# Patient Record
Sex: Female | Born: 1940
Health system: Southern US, Community
[De-identification: ages and names within clinical notes are randomized; demographics above are authoritative.]

## PROBLEM LIST (undated history)

## (undated) DIAGNOSIS — C801 Malignant (primary) neoplasm, unspecified: Secondary | ICD-10-CM

## (undated) DIAGNOSIS — K5790 Diverticulosis of intestine, part unspecified, without perforation or abscess without bleeding: Secondary | ICD-10-CM

## (undated) DIAGNOSIS — Z8619 Personal history of other infectious and parasitic diseases: Secondary | ICD-10-CM

## (undated) DIAGNOSIS — B029 Zoster without complications: Secondary | ICD-10-CM

## (undated) DIAGNOSIS — I2510 Atherosclerotic heart disease of native coronary artery without angina pectoris: Secondary | ICD-10-CM

## (undated) DIAGNOSIS — K648 Other hemorrhoids: Secondary | ICD-10-CM

## (undated) DIAGNOSIS — E119 Type 2 diabetes mellitus without complications: Secondary | ICD-10-CM

## (undated) DIAGNOSIS — L900 Lichen sclerosus et atrophicus: Secondary | ICD-10-CM

## (undated) DIAGNOSIS — E785 Hyperlipidemia, unspecified: Secondary | ICD-10-CM

## (undated) DIAGNOSIS — K219 Gastro-esophageal reflux disease without esophagitis: Secondary | ICD-10-CM

## (undated) DIAGNOSIS — K222 Esophageal obstruction: Secondary | ICD-10-CM

## (undated) DIAGNOSIS — L121 Cicatricial pemphigoid: Secondary | ICD-10-CM

## (undated) DIAGNOSIS — N189 Chronic kidney disease, unspecified: Secondary | ICD-10-CM

## (undated) DIAGNOSIS — K635 Polyp of colon: Secondary | ICD-10-CM

## (undated) DIAGNOSIS — R7989 Other specified abnormal findings of blood chemistry: Secondary | ICD-10-CM

## (undated) DIAGNOSIS — I10 Essential (primary) hypertension: Secondary | ICD-10-CM

## (undated) DIAGNOSIS — R945 Abnormal results of liver function studies: Secondary | ICD-10-CM

## (undated) HISTORY — PX: ANGIOPLASTY: SHX39

## (undated) HISTORY — DX: Hyperlipidemia, unspecified: E78.5

## (undated) HISTORY — DX: Essential (primary) hypertension: I10

## (undated) HISTORY — DX: Cicatricial pemphigoid: L12.1

## (undated) HISTORY — DX: Abnormal results of liver function studies: R94.5

## (undated) HISTORY — DX: Atherosclerotic heart disease of native coronary artery without angina pectoris: I25.10

## (undated) HISTORY — PX: CARDIAC ELECTROPHYSIOLOGY MAPPING AND ABLATION: SHX1292

## (undated) HISTORY — DX: Type 2 diabetes mellitus without complications: E11.9

## (undated) HISTORY — DX: Esophageal obstruction: K22.2

## (undated) HISTORY — PX: CATARACT EXTRACTION: SUR2

## (undated) HISTORY — PX: OOPHORECTOMY: SHX86

## (undated) HISTORY — DX: Polyp of colon: K63.5

## (undated) HISTORY — PX: NOSE SURGERY: SHX723

## (undated) HISTORY — DX: Personal history of other infectious and parasitic diseases: Z86.19

## (undated) HISTORY — DX: Other specified abnormal findings of blood chemistry: R79.89

## (undated) HISTORY — DX: Lichen sclerosus et atrophicus: L90.0

## (undated) HISTORY — DX: Gastro-esophageal reflux disease without esophagitis: K21.9

## (undated) HISTORY — DX: Other hemorrhoids: K64.8

## (undated) HISTORY — PX: COLONOSCOPY: SHX174

## (undated) HISTORY — DX: Diverticulosis of intestine, part unspecified, without perforation or abscess without bleeding: K57.90

## (undated) HISTORY — PX: ABDOMINAL HYSTERECTOMY: SHX81

---

## 1973-02-28 DIAGNOSIS — Z8619 Personal history of other infectious and parasitic diseases: Secondary | ICD-10-CM

## 1973-02-28 HISTORY — DX: Personal history of other infectious and parasitic diseases: Z86.19

## 2001-01-01 ENCOUNTER — Emergency Department (HOSPITAL_COMMUNITY): Admission: EM | Admit: 2001-01-01 | Discharge: 2001-01-01 | Payer: Self-pay | Admitting: Emergency Medicine

## 2002-04-03 ENCOUNTER — Encounter: Payer: Self-pay | Admitting: Family Medicine

## 2002-04-03 ENCOUNTER — Other Ambulatory Visit: Admission: RE | Admit: 2002-04-03 | Discharge: 2002-04-03 | Payer: Self-pay | Admitting: Family Medicine

## 2002-04-03 LAB — CONVERTED CEMR LAB: Pap Smear: NORMAL

## 2003-10-01 ENCOUNTER — Encounter: Payer: Self-pay | Admitting: Family Medicine

## 2003-12-26 ENCOUNTER — Ambulatory Visit: Payer: Self-pay | Admitting: Family Medicine

## 2004-01-06 ENCOUNTER — Ambulatory Visit: Payer: Self-pay | Admitting: Family Medicine

## 2004-02-05 ENCOUNTER — Ambulatory Visit: Payer: Self-pay | Admitting: Family Medicine

## 2004-08-17 ENCOUNTER — Ambulatory Visit: Payer: Self-pay | Admitting: Family Medicine

## 2004-11-12 ENCOUNTER — Ambulatory Visit: Payer: Self-pay | Admitting: Family Medicine

## 2005-02-02 ENCOUNTER — Ambulatory Visit: Payer: Self-pay | Admitting: Family Medicine

## 2005-02-03 ENCOUNTER — Ambulatory Visit: Payer: Self-pay | Admitting: Family Medicine

## 2005-02-22 ENCOUNTER — Ambulatory Visit: Payer: Self-pay | Admitting: Family Medicine

## 2005-06-21 ENCOUNTER — Ambulatory Visit: Payer: Self-pay | Admitting: Gastroenterology

## 2005-11-14 ENCOUNTER — Ambulatory Visit: Payer: Self-pay | Admitting: Ophthalmology

## 2005-12-02 ENCOUNTER — Ambulatory Visit: Payer: Self-pay | Admitting: Ophthalmology

## 2005-12-26 ENCOUNTER — Ambulatory Visit: Payer: Self-pay | Admitting: Ophthalmology

## 2006-01-02 ENCOUNTER — Ambulatory Visit: Payer: Self-pay | Admitting: Ophthalmology

## 2006-03-28 ENCOUNTER — Ambulatory Visit: Payer: Self-pay | Admitting: Family Medicine

## 2006-03-28 LAB — CONVERTED CEMR LAB
ALT: 34 units/L (ref 0–40)
AST: 35 units/L (ref 0–37)
Albumin: 3.5 g/dL (ref 3.5–5.2)
BUN: 10 mg/dL (ref 6–23)
Basophils Absolute: 0.1 10*3/uL (ref 0.0–0.1)
Basophils Relative: 0.8 % (ref 0.0–1.0)
CO2: 28 meq/L (ref 19–32)
Calcium: 9.6 mg/dL (ref 8.4–10.5)
Chloride: 108 meq/L (ref 96–112)
Cholesterol: 150 mg/dL (ref 0–200)
Creatinine, Ser: 0.7 mg/dL (ref 0.4–1.2)
Eosinophils Absolute: 0.2 10*3/uL (ref 0.0–0.6)
Eosinophils Relative: 1.6 % (ref 0.0–5.0)
Free T4: 0.7 ng/dL (ref 0.6–1.6)
GFR calc Af Amer: 107 mL/min
GFR calc non Af Amer: 89 mL/min
Glucose, Bld: 105 mg/dL — ABNORMAL HIGH (ref 70–99)
HCT: 40.7 % (ref 36.0–46.0)
HDL: 61.2 mg/dL (ref >39.0)
Hemoglobin: 14.3 g/dL (ref 12.0–15.0)
LDL Cholesterol: 72 mg/dL (ref 0–99)
Lymphocytes Relative: 34.1 % (ref 12.0–46.0)
MCHC: 35.1 g/dL (ref 30.0–36.0)
MCV: 91.9 fL (ref 78.0–100.0)
Monocytes Absolute: 0.6 10*3/uL (ref 0.2–0.7)
Monocytes Relative: 5.7 % (ref 3.0–11.0)
Neutro Abs: 5.9 10*3/uL (ref 1.4–7.7)
Neutrophils Relative %: 57.8 % (ref 43.0–77.0)
Phosphorus: 4.1 mg/dL (ref 2.3–4.6)
Platelets: 241 10*3/uL (ref 150–400)
Potassium: 4.7 meq/L (ref 3.5–5.1)
RBC: 4.43 M/uL (ref 3.87–5.11)
RDW: 11.9 % (ref 11.5–14.6)
Sodium: 141 meq/L (ref 135–145)
TSH: 2.31 microintl units/mL (ref 0.35–5.50)
Total CHOL/HDL Ratio: 2.5
Triglycerides: 85 mg/dL (ref 0–149)
VLDL: 17 mg/dL (ref 0–40)
WBC: 10.3 10*3/uL (ref 4.5–10.5)

## 2006-05-24 ENCOUNTER — Ambulatory Visit: Payer: Self-pay | Admitting: Family Medicine

## 2006-05-30 ENCOUNTER — Ambulatory Visit: Payer: Self-pay | Admitting: Family Medicine

## 2006-06-07 LAB — HM SIGMOIDOSCOPY: HM Sigmoidoscopy: NEGATIVE

## 2006-06-12 ENCOUNTER — Ambulatory Visit: Payer: Self-pay | Admitting: Family Medicine

## 2006-08-10 ENCOUNTER — Telehealth: Payer: Self-pay | Admitting: Family Medicine

## 2006-08-10 ENCOUNTER — Ambulatory Visit: Payer: Self-pay | Admitting: Family Medicine

## 2006-08-10 LAB — CONVERTED CEMR LAB
Bilirubin Urine: NEGATIVE
Casts: 0 /lpf
Glucose, Urine, Semiquant: NEGATIVE
Ketones, urine, test strip: NEGATIVE
Mucus, UA: 0
Nitrite: NEGATIVE
Protein, U semiquant: NEGATIVE
RBC / HPF: 0
Specific Gravity, Urine: <1.005
Urine crystals, microscopic: 0 /hpf
Urobilinogen, UA: 0.2
Yeast, UA: 0
pH: 6

## 2006-08-14 DIAGNOSIS — Z87440 Personal history of urinary (tract) infections: Secondary | ICD-10-CM | POA: Insufficient documentation

## 2007-02-18 ENCOUNTER — Emergency Department: Payer: Self-pay | Admitting: Emergency Medicine

## 2007-02-26 ENCOUNTER — Emergency Department: Payer: Self-pay | Admitting: Emergency Medicine

## 2007-03-13 ENCOUNTER — Encounter: Payer: Self-pay | Admitting: Family Medicine

## 2007-03-13 DIAGNOSIS — R32 Unspecified urinary incontinence: Secondary | ICD-10-CM | POA: Insufficient documentation

## 2007-03-13 DIAGNOSIS — J309 Allergic rhinitis, unspecified: Secondary | ICD-10-CM | POA: Insufficient documentation

## 2007-03-13 DIAGNOSIS — L659 Nonscarring hair loss, unspecified: Secondary | ICD-10-CM | POA: Insufficient documentation

## 2007-03-13 DIAGNOSIS — M79609 Pain in unspecified limb: Secondary | ICD-10-CM | POA: Insufficient documentation

## 2007-03-13 DIAGNOSIS — Z860100 Personal history of colon polyps, unspecified: Secondary | ICD-10-CM | POA: Insufficient documentation

## 2007-03-13 DIAGNOSIS — I10 Essential (primary) hypertension: Secondary | ICD-10-CM | POA: Insufficient documentation

## 2007-03-13 DIAGNOSIS — K219 Gastro-esophageal reflux disease without esophagitis: Secondary | ICD-10-CM | POA: Insufficient documentation

## 2007-03-13 DIAGNOSIS — I2510 Atherosclerotic heart disease of native coronary artery without angina pectoris: Secondary | ICD-10-CM | POA: Insufficient documentation

## 2007-03-13 DIAGNOSIS — E785 Hyperlipidemia, unspecified: Secondary | ICD-10-CM | POA: Insufficient documentation

## 2007-03-13 DIAGNOSIS — K449 Diaphragmatic hernia without obstruction or gangrene: Secondary | ICD-10-CM | POA: Insufficient documentation

## 2007-03-14 ENCOUNTER — Ambulatory Visit: Payer: Self-pay | Admitting: Family Medicine

## 2007-03-19 ENCOUNTER — Telehealth (INDEPENDENT_AMBULATORY_CARE_PROVIDER_SITE_OTHER): Payer: Self-pay | Admitting: *Deleted

## 2007-05-31 ENCOUNTER — Other Ambulatory Visit: Admission: RE | Admit: 2007-05-31 | Discharge: 2007-05-31 | Payer: Self-pay | Admitting: Family Medicine

## 2007-05-31 ENCOUNTER — Ambulatory Visit: Payer: Self-pay | Admitting: Family Medicine

## 2007-05-31 LAB — CONVERTED CEMR LAB
Bacteria, UA: 0
Bilirubin Urine: NEGATIVE
Blood in Urine, dipstick: NEGATIVE
Casts: 0 /lpf
Glucose, Urine, Semiquant: NEGATIVE
Nitrite: NEGATIVE
Pap Smear: ABNORMAL
RBC / HPF: 0
Specific Gravity, Urine: 1.01
Urine crystals, microscopic: 0 /hpf
Urobilinogen, UA: 0.2
WBC, UA: 0 cells/hpf
Yeast, UA: 0
pH: 6.5

## 2007-06-04 ENCOUNTER — Ambulatory Visit: Payer: Self-pay | Admitting: Family Medicine

## 2007-06-04 LAB — CONVERTED CEMR LAB
ALT: 21 units/L (ref 0–35)
AST: 21 units/L (ref 0–37)
Albumin: 3.7 g/dL (ref 3.5–5.2)
Alkaline Phosphatase: 109 units/L (ref 39–117)
BUN: 11 mg/dL (ref 6–23)
Basophils Absolute: 0.1 10*3/uL (ref 0.0–0.1)
Basophils Relative: 0.6 % (ref 0.0–1.0)
Bilirubin, Direct: 0.1 mg/dL (ref 0.0–0.3)
CO2: 30 meq/L (ref 19–32)
Calcium: 9.5 mg/dL (ref 8.4–10.5)
Chloride: 102 meq/L (ref 96–112)
Cholesterol: 161 mg/dL (ref 0–200)
Creatinine, Ser: 0.7 mg/dL (ref 0.4–1.2)
Eosinophils Absolute: 0.5 10*3/uL (ref 0.0–0.7)
Eosinophils Relative: 4.6 % (ref 0.0–5.0)
GFR calc Af Amer: 107 mL/min
GFR calc non Af Amer: 88 mL/min
Glucose, Bld: 91 mg/dL (ref 70–99)
HCT: 43.7 % (ref 36.0–46.0)
HDL: 53.2 mg/dL (ref >39.0)
Hemoglobin: 14.1 g/dL (ref 12.0–15.0)
LDL Cholesterol: 90 mg/dL (ref 0–99)
Lymphocytes Relative: 31.7 % (ref 12.0–46.0)
MCHC: 32.2 g/dL (ref 30.0–36.0)
MCV: 92.9 fL (ref 78.0–100.0)
Monocytes Absolute: 0.6 10*3/uL (ref 0.1–1.0)
Monocytes Relative: 5.7 % (ref 3.0–12.0)
Neutro Abs: 6.1 10*3/uL (ref 1.4–7.7)
Neutrophils Relative %: 57.4 % (ref 43.0–77.0)
Phosphorus: 3.8 mg/dL (ref 2.3–4.6)
Platelets: 264 10*3/uL (ref 150–400)
Potassium: 4 meq/L (ref 3.5–5.1)
RBC: 4.71 M/uL (ref 3.87–5.11)
RDW: 11.8 % (ref 11.5–14.6)
Sodium: 140 meq/L (ref 135–145)
TSH: 1.67 microintl units/mL (ref 0.35–5.50)
Total Bilirubin: 0.7 mg/dL (ref 0.3–1.2)
Total CHOL/HDL Ratio: 3
Total Protein: 7.2 g/dL (ref 6.0–8.3)
Triglycerides: 90 mg/dL (ref 0–149)
VLDL: 18 mg/dL (ref 0–40)
WBC: 10.7 10*3/uL — ABNORMAL HIGH (ref 4.5–10.5)

## 2007-06-21 ENCOUNTER — Ambulatory Visit: Payer: Self-pay | Admitting: Family Medicine

## 2007-06-27 ENCOUNTER — Encounter: Payer: Self-pay | Admitting: Family Medicine

## 2007-06-28 ENCOUNTER — Encounter: Payer: Self-pay | Admitting: Family Medicine

## 2007-06-29 DIAGNOSIS — L900 Lichen sclerosus et atrophicus: Secondary | ICD-10-CM

## 2007-06-29 HISTORY — DX: Lichen sclerosus et atrophicus: L90.0

## 2007-07-02 ENCOUNTER — Telehealth: Payer: Self-pay | Admitting: Family Medicine

## 2007-07-03 ENCOUNTER — Encounter (INDEPENDENT_AMBULATORY_CARE_PROVIDER_SITE_OTHER): Payer: Self-pay | Admitting: *Deleted

## 2007-07-05 DIAGNOSIS — R930 Abnormal findings on diagnostic imaging of skull and head, not elsewhere classified: Secondary | ICD-10-CM | POA: Insufficient documentation

## 2007-07-06 ENCOUNTER — Encounter: Payer: Self-pay | Admitting: Family Medicine

## 2007-07-09 ENCOUNTER — Encounter: Payer: Self-pay | Admitting: Family Medicine

## 2007-07-13 ENCOUNTER — Encounter: Payer: Self-pay | Admitting: Family Medicine

## 2007-08-21 ENCOUNTER — Telehealth: Payer: Self-pay | Admitting: Family Medicine

## 2007-09-03 ENCOUNTER — Encounter: Payer: Self-pay | Admitting: Family Medicine

## 2007-09-21 ENCOUNTER — Ambulatory Visit: Payer: Self-pay | Admitting: Family Medicine

## 2007-09-21 ENCOUNTER — Telehealth (INDEPENDENT_AMBULATORY_CARE_PROVIDER_SITE_OTHER): Payer: Self-pay | Admitting: *Deleted

## 2007-09-21 LAB — CONVERTED CEMR LAB
Bilirubin Urine: NEGATIVE
Casts: 0 /lpf
Glucose, Urine, Semiquant: NEGATIVE
Ketones, urine, test strip: NEGATIVE
Nitrite: NEGATIVE
Protein, U semiquant: NEGATIVE
Specific Gravity, Urine: <1.005
Urine crystals, microscopic: 0 /hpf
Urobilinogen, UA: 0.2
Yeast, UA: 0
pH: 7

## 2007-09-22 ENCOUNTER — Encounter: Payer: Self-pay | Admitting: Family Medicine

## 2007-10-08 ENCOUNTER — Ambulatory Visit: Payer: Self-pay | Admitting: Family Medicine

## 2007-10-25 ENCOUNTER — Encounter: Payer: Self-pay | Admitting: Family Medicine

## 2007-11-14 ENCOUNTER — Ambulatory Visit: Payer: Self-pay | Admitting: Family Medicine

## 2007-11-14 DIAGNOSIS — L129 Pemphigoid, unspecified: Secondary | ICD-10-CM | POA: Insufficient documentation

## 2007-11-15 ENCOUNTER — Encounter: Payer: Self-pay | Admitting: Family Medicine

## 2007-11-23 ENCOUNTER — Encounter: Payer: Self-pay | Admitting: Family Medicine

## 2008-01-09 ENCOUNTER — Encounter: Payer: Self-pay | Admitting: Family Medicine

## 2008-03-14 ENCOUNTER — Ambulatory Visit: Payer: Self-pay | Admitting: Family Medicine

## 2008-04-23 ENCOUNTER — Encounter: Payer: Self-pay | Admitting: Family Medicine

## 2008-04-25 DIAGNOSIS — R739 Hyperglycemia, unspecified: Secondary | ICD-10-CM | POA: Insufficient documentation

## 2008-04-28 ENCOUNTER — Ambulatory Visit: Payer: Self-pay | Admitting: Family Medicine

## 2008-04-29 ENCOUNTER — Encounter (INDEPENDENT_AMBULATORY_CARE_PROVIDER_SITE_OTHER): Payer: Self-pay | Admitting: *Deleted

## 2008-04-29 LAB — CONVERTED CEMR LAB
BUN: 14 mg/dL (ref 6–23)
CO2: 30 meq/L (ref 19–32)
Calcium: 9.7 mg/dL (ref 8.4–10.5)
Chloride: 103 meq/L (ref 96–112)
Creatinine, Ser: 0.7 mg/dL (ref 0.4–1.2)
Creatinine,U: 118.5 mg/dL
GFR calc Af Amer: 107 mL/min
GFR calc non Af Amer: 88 mL/min
Glucose, Bld: 102 mg/dL — ABNORMAL HIGH (ref 70–99)
Hgb A1c MFr Bld: 7.4 % — ABNORMAL HIGH (ref 4.6–6.0)
Microalb Creat Ratio: 2.5 mg/g (ref 0.0–30.0)
Microalb, Ur: 0.3 mg/dL (ref 0.0–1.9)
Potassium: 4.3 meq/L (ref 3.5–5.1)
Sodium: 141 meq/L (ref 135–145)

## 2008-05-05 ENCOUNTER — Encounter: Payer: Self-pay | Admitting: Family Medicine

## 2008-06-02 ENCOUNTER — Ambulatory Visit: Payer: Self-pay | Admitting: Family Medicine

## 2008-06-02 DIAGNOSIS — Z780 Asymptomatic menopausal state: Secondary | ICD-10-CM | POA: Insufficient documentation

## 2008-06-04 LAB — CONVERTED CEMR LAB
ALT: 30 units/L (ref 0–35)
AST: 20 units/L (ref 0–37)
Albumin: 3.6 g/dL (ref 3.5–5.2)
Alkaline Phosphatase: 57 units/L (ref 39–117)
BUN: 15 mg/dL (ref 6–23)
Bilirubin, Direct: 0.1 mg/dL (ref 0.0–0.3)
CO2: 30 meq/L (ref 19–32)
Calcium: 9.4 mg/dL (ref 8.4–10.5)
Chloride: 106 meq/L (ref 96–112)
Cholesterol: 172 mg/dL (ref 0–200)
Creatinine, Ser: 0.8 mg/dL (ref 0.4–1.2)
GFR calc non Af Amer: 75.4 mL/min (ref >60)
Glucose, Bld: 110 mg/dL — ABNORMAL HIGH (ref 70–99)
HDL: 48.7 mg/dL (ref >39.00)
Hemoglobin: 12.8 g/dL (ref 12.0–15.0)
LDL Cholesterol: 95 mg/dL (ref 0–99)
Potassium: 4.6 meq/L (ref 3.5–5.1)
Sodium: 141 meq/L (ref 135–145)
TSH: 1.56 microintl units/mL (ref 0.35–5.50)
Total Bilirubin: 0.8 mg/dL (ref 0.3–1.2)
Total CHOL/HDL Ratio: 4
Total Protein: 5.8 g/dL — ABNORMAL LOW (ref 6.0–8.3)
Triglycerides: 144 mg/dL (ref 0.0–149.0)
VLDL: 28.8 mg/dL (ref 0.0–40.0)

## 2008-06-20 ENCOUNTER — Encounter: Payer: Self-pay | Admitting: Family Medicine

## 2008-06-28 LAB — HM DEXA SCAN: HM Dexa Scan: NORMAL

## 2008-07-03 ENCOUNTER — Encounter: Payer: Self-pay | Admitting: Family Medicine

## 2008-07-03 LAB — HM MAMMOGRAPHY: HM Mammogram: NEGATIVE

## 2008-07-08 ENCOUNTER — Encounter: Payer: Self-pay | Admitting: Family Medicine

## 2008-07-12 ENCOUNTER — Encounter (INDEPENDENT_AMBULATORY_CARE_PROVIDER_SITE_OTHER): Payer: Self-pay | Admitting: *Deleted

## 2008-08-19 ENCOUNTER — Ambulatory Visit: Payer: PRIVATE HEALTH INSURANCE | Admitting: Unknown Physician Specialty

## 2008-08-19 ENCOUNTER — Encounter: Payer: Self-pay | Admitting: Family Medicine

## 2008-08-19 LAB — HM COLONOSCOPY: HM Colonoscopy: ABNORMAL

## 2008-10-22 ENCOUNTER — Encounter: Payer: Self-pay | Admitting: Family Medicine

## 2008-12-23 ENCOUNTER — Ambulatory Visit: Payer: Self-pay | Admitting: Family Medicine

## 2008-12-24 ENCOUNTER — Encounter (INDEPENDENT_AMBULATORY_CARE_PROVIDER_SITE_OTHER): Payer: Self-pay | Admitting: *Deleted

## 2008-12-24 LAB — CONVERTED CEMR LAB
ALT: 31 units/L (ref 0–35)
AST: 20 units/L (ref 0–37)
Albumin: 3.6 g/dL (ref 3.5–5.2)
BUN: 14 mg/dL (ref 6–23)
CO2: 30 meq/L (ref 19–32)
Calcium: 9.1 mg/dL (ref 8.4–10.5)
Chloride: 102 meq/L (ref 96–112)
Cholesterol: 192 mg/dL (ref 0–200)
Creatinine, Ser: 0.8 mg/dL (ref 0.4–1.2)
GFR calc non Af Amer: 75.28 mL/min (ref >60)
Glucose, Bld: 99 mg/dL (ref 70–99)
HDL: 60 mg/dL (ref >39.00)
Hgb A1c MFr Bld: 6.7 % — ABNORMAL HIGH (ref 4.6–6.5)
LDL Cholesterol: 105 mg/dL — ABNORMAL HIGH (ref 0–99)
Phosphorus: 3.5 mg/dL (ref 2.3–4.6)
Potassium: 4.5 meq/L (ref 3.5–5.1)
Sodium: 140 meq/L (ref 135–145)
Total CHOL/HDL Ratio: 3
Triglycerides: 134 mg/dL (ref 0.0–149.0)
VLDL: 26.8 mg/dL (ref 0.0–40.0)

## 2009-02-04 ENCOUNTER — Ambulatory Visit: Payer: Self-pay | Admitting: Family Medicine

## 2009-02-05 ENCOUNTER — Ambulatory Visit: Payer: Self-pay | Admitting: Family Medicine

## 2009-02-05 DIAGNOSIS — G4700 Insomnia, unspecified: Secondary | ICD-10-CM | POA: Insufficient documentation

## 2009-02-05 LAB — CONVERTED CEMR LAB
ALT: 23 units/L (ref 0–35)
AST: 20 units/L (ref 0–37)
Cholesterol: 155 mg/dL (ref 0–200)
Direct LDL: 86.7 mg/dL
HDL: 50.4 mg/dL (ref >39.00)
Total CHOL/HDL Ratio: 3
Triglycerides: 221 mg/dL — ABNORMAL HIGH (ref 0.0–149.0)
VLDL: 44.2 mg/dL — ABNORMAL HIGH (ref 0.0–40.0)

## 2009-02-18 ENCOUNTER — Encounter: Payer: Self-pay | Admitting: Family Medicine

## 2009-03-03 ENCOUNTER — Ambulatory Visit: Payer: Self-pay | Admitting: Family Medicine

## 2009-03-03 ENCOUNTER — Telehealth: Payer: Self-pay | Admitting: Family Medicine

## 2009-03-06 ENCOUNTER — Ambulatory Visit: Payer: Self-pay | Admitting: Family Medicine

## 2009-03-07 ENCOUNTER — Emergency Department: Payer: PRIVATE HEALTH INSURANCE | Admitting: Emergency Medicine

## 2009-03-07 ENCOUNTER — Encounter: Payer: Self-pay | Admitting: Family Medicine

## 2009-03-07 ENCOUNTER — Inpatient Hospital Stay (HOSPITAL_COMMUNITY): Admission: EM | Admit: 2009-03-07 | Discharge: 2009-03-11 | Payer: Self-pay | Admitting: Emergency Medicine

## 2009-03-12 ENCOUNTER — Encounter: Payer: Self-pay | Admitting: Family Medicine

## 2009-03-31 ENCOUNTER — Ambulatory Visit: Payer: Self-pay | Admitting: Family Medicine

## 2009-05-04 ENCOUNTER — Ambulatory Visit: Payer: Self-pay | Admitting: Family Medicine

## 2009-05-05 LAB — CONVERTED CEMR LAB
ALT: 19 units/L (ref 0–35)
AST: 18 units/L (ref 0–37)
Albumin: 3.5 g/dL (ref 3.5–5.2)
BUN: 16 mg/dL (ref 6–23)
CO2: 30 meq/L (ref 19–32)
Calcium: 9.2 mg/dL (ref 8.4–10.5)
Chloride: 108 meq/L (ref 96–112)
Cholesterol: 185 mg/dL (ref 0–200)
Creatinine, Ser: 0.8 mg/dL (ref 0.4–1.2)
GFR calc non Af Amer: 75.2 mL/min (ref >60)
Glucose, Bld: 91 mg/dL (ref 70–99)
HDL: 60.3 mg/dL (ref >39.00)
Hgb A1c MFr Bld: 6 % (ref 4.6–6.5)
LDL Cholesterol: 104 mg/dL — ABNORMAL HIGH (ref 0–99)
Phosphorus: 3.8 mg/dL (ref 2.3–4.6)
Potassium: 4.3 meq/L (ref 3.5–5.1)
Sodium: 142 meq/L (ref 135–145)
Total CHOL/HDL Ratio: 3
Triglycerides: 103 mg/dL (ref 0.0–149.0)
VLDL: 20.6 mg/dL (ref 0.0–40.0)

## 2009-05-07 ENCOUNTER — Ambulatory Visit: Payer: Self-pay | Admitting: Family Medicine

## 2009-05-21 ENCOUNTER — Ambulatory Visit: Payer: Self-pay | Admitting: Family Medicine

## 2009-05-21 DIAGNOSIS — M19019 Primary osteoarthritis, unspecified shoulder: Secondary | ICD-10-CM | POA: Insufficient documentation

## 2009-05-22 ENCOUNTER — Encounter: Admission: RE | Admit: 2009-05-22 | Discharge: 2009-05-22 | Payer: Self-pay | Admitting: Family Medicine

## 2009-07-02 ENCOUNTER — Ambulatory Visit: Payer: Self-pay | Admitting: Family Medicine

## 2009-08-05 ENCOUNTER — Telehealth: Payer: Self-pay | Admitting: Family Medicine

## 2009-08-05 ENCOUNTER — Encounter: Payer: Self-pay | Admitting: Family Medicine

## 2009-08-11 LAB — HM DIABETES EYE EXAM: HM Diabetic Eye Exam: NORMAL

## 2009-08-26 ENCOUNTER — Ambulatory Visit: Payer: Self-pay | Admitting: Internal Medicine

## 2009-08-26 ENCOUNTER — Emergency Department (HOSPITAL_COMMUNITY): Admission: EM | Admit: 2009-08-26 | Discharge: 2009-08-26 | Payer: Self-pay | Admitting: Emergency Medicine

## 2009-08-26 ENCOUNTER — Encounter: Payer: Self-pay | Admitting: Internal Medicine

## 2009-08-26 ENCOUNTER — Encounter: Payer: Self-pay | Admitting: Family Medicine

## 2009-09-21 ENCOUNTER — Encounter: Payer: Self-pay | Admitting: Family Medicine

## 2009-09-22 ENCOUNTER — Emergency Department (HOSPITAL_COMMUNITY): Admission: EM | Admit: 2009-09-22 | Discharge: 2009-09-22 | Payer: Self-pay | Admitting: Emergency Medicine

## 2009-09-22 ENCOUNTER — Encounter: Payer: Self-pay | Admitting: Internal Medicine

## 2009-09-22 DIAGNOSIS — I498 Other specified cardiac arrhythmias: Secondary | ICD-10-CM

## 2009-09-22 DIAGNOSIS — Z8679 Personal history of other diseases of the circulatory system: Secondary | ICD-10-CM | POA: Insufficient documentation

## 2009-09-24 ENCOUNTER — Telehealth: Payer: Self-pay | Admitting: Cardiology

## 2009-09-24 ENCOUNTER — Ambulatory Visit: Payer: Self-pay | Admitting: Cardiology

## 2009-10-01 ENCOUNTER — Ambulatory Visit: Payer: Self-pay | Admitting: Internal Medicine

## 2009-10-07 ENCOUNTER — Ambulatory Visit (HOSPITAL_COMMUNITY): Admission: RE | Admit: 2009-10-07 | Discharge: 2009-10-07 | Payer: Self-pay | Admitting: Internal Medicine

## 2009-10-07 ENCOUNTER — Ambulatory Visit: Payer: Self-pay | Admitting: Internal Medicine

## 2009-11-05 ENCOUNTER — Ambulatory Visit: Payer: Self-pay | Admitting: Family Medicine

## 2009-11-05 LAB — CONVERTED CEMR LAB
ALT: 22 units/L (ref 0–35)
AST: 22 units/L (ref 0–37)
Albumin: 3.4 g/dL — ABNORMAL LOW (ref 3.5–5.2)
BUN: 14 mg/dL (ref 6–23)
CO2: 29 meq/L (ref 19–32)
Calcium: 9.4 mg/dL (ref 8.4–10.5)
Chloride: 106 meq/L (ref 96–112)
Cholesterol: 145 mg/dL (ref 0–200)
Creatinine, Ser: 0.5 mg/dL (ref 0.4–1.2)
Creatinine,U: 90.7 mg/dL
GFR calc non Af Amer: 126.25 mL/min (ref >60)
Glucose, Bld: 89 mg/dL (ref 70–99)
HDL: 40.9 mg/dL (ref >39.00)
Hgb A1c MFr Bld: 6.2 % (ref 4.6–6.5)
LDL Cholesterol: 79 mg/dL (ref 0–99)
Microalb Creat Ratio: 0.8 mg/g (ref 0.0–30.0)
Microalb, Ur: 0.7 mg/dL (ref 0.0–1.9)
Phosphorus: 3.9 mg/dL (ref 2.3–4.6)
Potassium: 4.5 meq/L (ref 3.5–5.1)
Sodium: 142 meq/L (ref 135–145)
TSH: 2.39 microintl units/mL (ref 0.35–5.50)
Total CHOL/HDL Ratio: 4
Triglycerides: 127 mg/dL (ref 0.0–149.0)
VLDL: 25.4 mg/dL (ref 0.0–40.0)

## 2009-11-12 ENCOUNTER — Ambulatory Visit: Payer: Self-pay | Admitting: Family Medicine

## 2009-11-12 LAB — HM DIABETES FOOT EXAM

## 2009-11-23 ENCOUNTER — Encounter: Payer: Self-pay | Admitting: Family Medicine

## 2009-12-01 ENCOUNTER — Ambulatory Visit: Payer: Self-pay | Admitting: Internal Medicine

## 2009-12-09 ENCOUNTER — Ambulatory Visit: Payer: Self-pay | Admitting: Cardiology

## 2009-12-09 DIAGNOSIS — R0602 Shortness of breath: Secondary | ICD-10-CM | POA: Insufficient documentation

## 2009-12-15 ENCOUNTER — Telehealth (INDEPENDENT_AMBULATORY_CARE_PROVIDER_SITE_OTHER): Payer: Self-pay | Admitting: *Deleted

## 2009-12-16 ENCOUNTER — Encounter: Payer: Self-pay | Admitting: Cardiology

## 2009-12-16 ENCOUNTER — Encounter (HOSPITAL_COMMUNITY)
Admission: RE | Admit: 2009-12-16 | Discharge: 2010-02-27 | Payer: Self-pay | Source: Home / Self Care | Attending: Cardiology | Admitting: Cardiology

## 2009-12-16 ENCOUNTER — Ambulatory Visit: Payer: Self-pay

## 2009-12-16 ENCOUNTER — Ambulatory Visit: Payer: Self-pay | Admitting: Cardiology

## 2010-01-04 ENCOUNTER — Encounter: Payer: Self-pay | Admitting: Family Medicine

## 2010-01-11 ENCOUNTER — Encounter: Payer: Self-pay | Admitting: Family Medicine

## 2010-01-25 ENCOUNTER — Encounter: Payer: Self-pay | Admitting: Family Medicine

## 2010-01-27 ENCOUNTER — Telehealth: Payer: Self-pay | Admitting: Family Medicine

## 2010-02-08 ENCOUNTER — Encounter: Payer: Self-pay | Admitting: Family Medicine

## 2010-02-09 ENCOUNTER — Ambulatory Visit: Payer: Self-pay | Admitting: Family Medicine

## 2010-02-09 ENCOUNTER — Encounter: Payer: Self-pay | Admitting: Family Medicine

## 2010-02-09 DIAGNOSIS — R740 Nonspecific elevation of levels of transaminase and lactic acid dehydrogenase [LDH]: Secondary | ICD-10-CM

## 2010-02-09 DIAGNOSIS — R7402 Elevation of levels of lactic acid dehydrogenase (LDH): Secondary | ICD-10-CM | POA: Insufficient documentation

## 2010-02-15 LAB — CONVERTED CEMR LAB
ALT: 31 units/L (ref 0–35)
AST: 21 units/L (ref 0–37)
Albumin: 3.5 g/dL (ref 3.5–5.2)
Alkaline Phosphatase: 75 units/L (ref 39–117)
Amylase: 37 units/L (ref 27–131)
BUN: 18 mg/dL (ref 6–23)
Basophils Absolute: 0 10*3/uL (ref 0.0–0.1)
Basophils Relative: 0.3 % (ref 0.0–3.0)
Bilirubin, Direct: 0.1 mg/dL (ref 0.0–0.3)
CO2: 30 meq/L (ref 19–32)
Calcium: 9.5 mg/dL (ref 8.4–10.5)
Chloride: 105 meq/L (ref 96–112)
Creatinine, Ser: 0.7 mg/dL (ref 0.4–1.2)
Eosinophils Absolute: 0 10*3/uL (ref 0.0–0.7)
Eosinophils Relative: 0.1 % (ref 0.0–5.0)
Ferritin: 10.5 ng/mL (ref 10.0–291.0)
GFR calc non Af Amer: 92.08 mL/min (ref >60.00)
Glucose, Bld: 126 mg/dL — ABNORMAL HIGH (ref 70–99)
HCT: 40.1 % (ref 36.0–46.0)
Hemoglobin: 13.8 g/dL (ref 12.0–15.0)
Hgb A1c MFr Bld: 6.5 % (ref 4.6–6.5)
Lymphocytes Relative: 12.2 % (ref 12.0–46.0)
Lymphs Abs: 1.3 10*3/uL (ref 0.7–4.0)
MCHC: 34.3 g/dL (ref 30.0–36.0)
MCV: 95.9 fL (ref 78.0–100.0)
Monocytes Absolute: 0.2 10*3/uL (ref 0.1–1.0)
Monocytes Relative: 1.6 % — ABNORMAL LOW (ref 3.0–12.0)
Neutro Abs: 9.2 10*3/uL — ABNORMAL HIGH (ref 1.4–7.7)
Neutrophils Relative %: 85.8 % — ABNORMAL HIGH (ref 43.0–77.0)
Platelets: 186 10*3/uL (ref 150.0–400.0)
Potassium: 4.1 meq/L (ref 3.5–5.1)
RBC: 4.18 M/uL (ref 3.87–5.11)
RDW: 16.7 % — ABNORMAL HIGH (ref 11.5–14.6)
Retic Ct Pct: 1 % (ref 0.4–3.1)
Sodium: 142 meq/L (ref 135–145)
Total Bilirubin: 0.4 mg/dL (ref 0.3–1.2)
Total Protein: 6.1 g/dL (ref 6.0–8.3)
WBC: 10.8 10*3/uL — ABNORMAL HIGH (ref 4.5–10.5)

## 2010-03-08 ENCOUNTER — Encounter: Payer: Self-pay | Admitting: Family Medicine

## 2010-03-28 LAB — CONVERTED CEMR LAB
BUN: 8 mg/dL (ref 6–23)
Basophils Absolute: 0.1 10*3/uL (ref 0.0–0.1)
Basophils Relative: 1.2 % (ref 0.0–3.0)
CO2: 28 meq/L (ref 19–32)
Calcium: 9.9 mg/dL (ref 8.4–10.5)
Chloride: 107 meq/L (ref 96–112)
Creatinine, Ser: 0.5 mg/dL (ref 0.4–1.2)
Eosinophils Absolute: 0.1 10*3/uL (ref 0.0–0.7)
Eosinophils Relative: 0.8 % (ref 0.0–5.0)
GFR calc non Af Amer: 126.28 mL/min (ref >60)
Glucose, Bld: 99 mg/dL (ref 70–99)
HCT: 40.8 % (ref 36.0–46.0)
Hemoglobin: 14 g/dL (ref 12.0–15.0)
INR: 1 (ref 0.8–1.0)
Lymphocytes Relative: 27.4 % (ref 12.0–46.0)
Lymphs Abs: 2.1 10*3/uL (ref 0.7–4.0)
MCHC: 34.2 g/dL (ref 30.0–36.0)
MCV: 94.3 fL (ref 78.0–100.0)
Monocytes Absolute: 0.5 10*3/uL (ref 0.1–1.0)
Monocytes Relative: 6.7 % (ref 3.0–12.0)
Neutro Abs: 4.8 10*3/uL (ref 1.4–7.7)
Neutrophils Relative %: 63.9 % (ref 43.0–77.0)
Platelets: 295 10*3/uL (ref 150.0–400.0)
Potassium: 5.2 meq/L — ABNORMAL HIGH (ref 3.5–5.1)
Prothrombin Time: 10.6 s (ref 9.7–11.8)
RBC: 4.33 M/uL (ref 3.87–5.11)
RDW: 15.5 % — ABNORMAL HIGH (ref 11.5–14.6)
Sodium: 142 meq/L (ref 135–145)
WBC: 7.5 10*3/uL (ref 4.5–10.5)
aPTT: 30.1 s — ABNORMAL HIGH (ref 21.7–28.8)

## 2010-04-01 NOTE — Consult Note (Signed)
Summary: [facility]/Dr. [facility]Dr. Jenne Campus   Imported By: Eleonore Chiquito 11/27/2007 11:04:58  _____________________________________________________________________  External Attachment:    Type:   Image     Comment:   External Document

## 2010-04-01 NOTE — Letter (Signed)
Summary: [facility]  [facility]   Imported By: Lanelle Bal 08/15/2008 09:03:55  _____________________________________________________________________  External Attachment:    Type:   Image     Comment:   External Document

## 2010-04-01 NOTE — Consult Note (Signed)
Summary: [facility]Consultation Report/Dr[facility]ult[facility]/Dr. Kincius   Imported By: Mickle Asper 10/29/2007 09:36:35  _____________________________________________________________________  External Attachment:    Type:   Image     Comment:   External Document

## 2010-04-01 NOTE — Progress Notes (Signed)
Summary: clarify rx  Phone Note From Pharmacy Call back at [phone]   Caller: [facility] Call For: [facility]  Summary of Call: clarify rx they have rx for ditropan 5mg  tabs 1 tab daily but quantity is for 180.  is ptto be on 1 tab daily # 90 or two times a day # 180, also have diltiazem 300 mg coated beads. pharmacist says he doesn't know what coated beads are but is thie to be the diltiazem CD Initial call taken by: Liane Comber,  March 19, 2007 1:10 PM  Follow-up for Phone Call        I think directions for ditropan are two times a day- you can double check with pt- if this is correct tell pharmacist two times a day- and change dosing on EMR med list also cardizem CD 300 is fine- also change on med list thanks  Follow-up by: Judith Part MD,  March 19, 2007 3:16 PM  Additional Follow-up for Phone Call Additional follow up Details #1::        pharmacy called ..................................................................Marland KitchenLiane Comber  March 19, 2007 5:14 PM

## 2010-04-01 NOTE — Progress Notes (Signed)
Summary: question re med  Phone Note Call from Patient   Caller: Patient [phone] Reason for Call: Talk to Nurse Summary of Call: question re dilitiazem, does she still need to take? forgot to ask while she was here today-pls call [phone] Initial call taken by: Glynda Jaeger,  September 24, 2009 4:13 PM  Follow-up for Phone Call        PT AWARE TO CONT  TO TAKE  DILTIAZEM AS PERSCRIBED. Follow-up by: Scherrie Bateman, LPN,  September 24, 2009 5:30 PM     Appended Document: question re med  Reviewed Juanito Doom, MD

## 2010-04-01 NOTE — Consult Note (Signed)
Summary: [facility]/Dr. Kincius  [facility]/Dr. Kincius   Imported By: Eleonore Chiquito 07/13/2007 15:37:54  _____________________________________________________________________  External Attachment:    Type:   Image     Comment:   External Document

## 2010-04-01 NOTE — Assessment & Plan Note (Signed)
Summary: CONSULT-SHOULDER PAIN  CYD   Vital Signs:  Patient profile:   70 year old female Height:      60 inches Weight:      139.2 pounds BMI:     27.28 Temp:     98.0 degrees F oral Pulse rate:   80 / minute Pulse rhythm:   regular BP sitting:   100 / 62  (left arm) Cuff size:   regular  Vitals Entered By: Benny Lennert CMA Duncan Dull) (May 21, 2009 11:27 AM)  History of Present Illness: Chief complaint consult right shoulder pain  70 year old with R shoulder pain:  The patient noted above presents with shoulder pain that has been ongoing for 7-8 months, worsened over the last 2 there is no history of trauma or accident. The patient denies neck pain or radicular symptoms. Denies dislocation, subluxation, separation of the shoulder. The patient sever pain with abduction. No sig loss of motion  Tried PT: No  Prior shoulder Injury: No Prior surgery: No Prior fracture: No  Handedness: R  Pain with abduction, lateral  Allergies: 1)  ! * Tetanus 2)  ! Hydrocodone 3)  ! * Tessalon Pearles 4)  ! * Shrimp ? 5)  ! Asa 6)  Lipitor  Past History:  Past medical, surgical, family and social histories (including risk factors) reviewed, and no changes noted (except as noted below).  Past Medical History: Reviewed history from 06/02/2008 and no changes required. Allergic rhinitis Colonic polyps, hx of Coronary artery disease GERD Hyperlipidemia Hypertension Urinary incontinence- overactive bladder 5/09 lichen sclerosis (dx by gyn after abn pap) Cicatricial Pemphigoid, autoimmune d/o, affecting eyes, mouth, throat now (Dx [facility]) lichen sclerosis - vulvar    GYN--Dr Kincius Opthalmology--Dr. Dingledein urol- [place] - Dr Trisha Mangle [facility])- for pemphigoid   Past Surgical History: Reviewed history from 08/28/2008 and no changes required. Hysterectomy Oophorectomy LAD angioplasty (1995) Colonoscopy- polyps (1995) Cardiolite (08/2001) Nasal biopsy EGD- HH, esoph  stricture- dilated (05/2005) Colonoscopy- diverticulosis, polyp Cataract extraction 5/09 CT of chest- granulomas in upper lobe of lung and liver  5/10 dexa normal 6/10 colonoscopy- polyp  Family History: Reviewed history from 12/23/2008 and no changes required. Father: deceased- lung cancer Mother: CVA, alzheimer's Siblings: 1 brother with HTN, 1 sister son with esoph cancer-- passed away   Social History: Reviewed history from 10/08/2007 and no changes required. Marital Status: Married (husband is fighting cancer) Children: 2 Occupation: retired exercises on stationary bike/walks Never Smoked Alcohol use-no  Review of Systems       REVIEW OF SYSTEMS  GEN: No systemic complaints, no fevers, chills, sweats, or other acute illnesses MSK: Detailed in the HPI GI: tolerating PO intake without difficulty Neuro: No numbness, parasthesias, or tingling associated. Otherwise the pertinent positives of the ROS are noted above.    Physical Exam  General:  GEN: Well-developed,well-nourished,in no acute distress; alert,appropriate and cooperative throughout examination HEENT: Normocephalic and atraumatic without obvious abnormalities. No apparent alopecia or balding. Ears, externally no deformities PULM: Breathing comfortably in no respiratory distress EXT: No clubbing, cyanosis, or edema PSYCH: Normally interactive. Cooperative during the interview. Pleasant. Friendly and conversant. Not anxious or depressed appearing. Normal, full affect.  Msk:  Shoulder: R Inspection: No muscle wasting or winging Ecchymosis/edema: neg  AC joint, scapula, clavicle: TTP at [facility] joint Cervical spine: NT, full ROM Spurling's: neg Abduction: full, 5/5 - painful arch of motion Flexion: full, 5/5 IR, full, lift-off: 5/5 ER at neutral: full, 5/5 AC crossover: pos Neer: pos Hawkins:  pos Drop Test: neg Empty Can: pos Supraspinatus insertion: mild-mod T Bicipital groove: NT Speed's: neg Yergason's:  neg Sulcus sign: neg Scapular dyskinesis: none C5-T1 intact  Neuro: Sensation intact Grip 5/5    Impression & Recommendations:  Problem # 1:  SHOULDER IMPINGEMENT SYNDROME, RIGHT (ICD-726.2) Assessment New Shoulder anatomy was reviewed with the patient using and anatomical model.  Rotator cuff strengthening and scapular stabilization exercises were reviewed with the patient.  A handout was given based on a shoulder program from Dr. Graciella Freer of [facility] and the [facility].  Retraining shoulder mechanics and function was emphasized to the patient with rehab done at least 5-6 days a week. formal PT to assist with scapular stabilization and RTC strengthening.   SubAC Injection, RIGHT Verbal consent was obtained from the patient. Risks, benefits, and alternatives were explained. Patient prepped with Betadine and Ethyl Chloride used for anesthesia. The subacromial space was injected using the posterior approach. The patient tolerated the procedure well and had decreased pain post injection. No complications. Injection: 9 cc of Marcaine 0.5% and 1cc of Kenalog 40 mg. Needle: 22 gauge   Orders: Physical Therapy Referral (PT)  Problem # 2:  ARTHRITIS, SHOULDER (ICD-716.91) Assessment: New expect AC arthropathy with exacerbation based  on exam  Shoulder films to evaluate  Orders: Radiology Referral (Radiology) Physical Therapy Referral (PT) Joint Aspirate / Injection, Large (20610) Kenalog 10mg  (4units) (J3301)  Complete Medication List: 1)  Omeprazole 20 Mg Tbec (Omeprazole) .... Take 1 tablet by mouth every morning 2)  Diltiazem Hcl Cr 120 Mg Xr12h-cap (Diltiazem hcl) .Marland Kitchen.. 1 by mouth once daily in am 3)  Lisinopril 40 Mg Tabs (Lisinopril) .... Take 1 tablet by mouth once a day 4)  Zocor 20 Mg Tabs (Simvastatin) .Marland Kitchen.. 1 by mouth once daily 5)  Prednisone 10 Mg Tabs (Prednisone) .Marland Kitchen.. 15 mg by mouth every other day, 30 mg by mouth every other day, 25mg  every ohter day  (alternating dose) 6)  Omnipred 1 % Susp (Prednisolone acetate) .... One drop in each eye two times a day 7)  Timolol Maleate 0.25 % Soln (Timolol maleate) .... One drop in each eye once daily 8)  Metformin Hcl 500 Mg Tabs (Metformin hcl) .... 1/2 tab by mouth two times a day 9)  Azathioprine 50 Mg Tabs (Azathioprine) .... Take one and a half  by mouth daily 10)  Glucometer  .... To check sugar once daily and as needed for diabetes 250.0 11)  Glucose Test Strips  .... To check sugar once daily and as needed for diabetes 250.0 12)  Ambien 10 Mg Tabs (Zolpidem tartrate) .... 1/2 to 1 by mouth at bedtime as needed insomnia 13)  Toviaz 8 Mg Xr24h-tab (Fesoterodine fumarate) .... Take one by mouth daily  Patient Instructions: 1)  XRAYS 2)  PT 3)  Referral Appointment Information 4)  Day/Date: 5)  Time: 6)  Place/MD: 7)  Address: 8)  Phone/Fax: 9)  Patient given appointment information. Information/Orders faxed/mailed.   Current Allergies (reviewed today): ! * TETANUS ! HYDROCODONE ! * TESSALON PEARLES ! * SHRIMP ? ! ASA LIPITOR

## 2010-04-01 NOTE — Miscellaneous (Signed)
Summary: mammo results  Clinical Lists Changes  Observations: Added new observation of MAMMO DUE: 06/2008 (07/03/2007 8:17) Added new observation of MAMMOGRAM: normal (07/03/2007 8:17)       Preventive Care Screening  Mammogram:    Date:  07/03/2007    Next Due:  06/2008    Results:  normal

## 2010-04-01 NOTE — Miscellaneous (Signed)
  Clinical Lists Changes  Medications: Added new medication of METFORMIN HCL 500 MG TABS (METFORMIN HCL) 1/2 tab by mouth two times a day - Signed Rx of METFORMIN HCL 500 MG TABS (METFORMIN HCL) 1/2 tab by mouth two times a day;  #30 x 3;  Signed;  Entered by: Liane Comber;  Authorized by: Judith Part MD;  Method used: Electronically to [facility] [address] [place], [place], [place]  [address], Ph: [phone], Fax: [phone]    Prescriptions: METFORMIN HCL 500 MG TABS (METFORMIN HCL) 1/2 tab by mouth two times a day  #30 x 3   Entered by:   Liane Comber   Authorized by:   Judith Part MD   Signed by:   Liane Comber on 04/29/2008   Method used:   [facility] to        [facility]       [address]       [place], [place]  [address]       Ph: [phone]       Fax: [phone]   RxID:   [id number]

## 2010-04-01 NOTE — Letter (Signed)
Summary: [facility]  [facility]   Imported By: Lanelle Bal 01/11/2010 11:28:51  _____________________________________________________________________  External Attachment:    Type:   Image     Comment:   External Document

## 2010-04-01 NOTE — Assessment & Plan Note (Signed)
Summary: CHECK UP/CLE   Vital Signs:  Patient profile:   70 year old female Height:      60 inches Weight:      145 pounds BMI:     28.42 Temp:     97.5 degrees F oral Pulse rate:   80 / minute Pulse rhythm:   regular BP sitting:   120 / 70  (left arm) Cuff size:   regular  Vitals Entered By: Liane Comber (June 02, 2008 9:35 AM)  History of Present Illness:  past tot hyst  lichen sclerosis - tx with topical steriod -- is doing much better  will get pap with her in june   Td 2000- is due   lipids (Last Lipid ProfileCholesterol: 161 (05/31/2007 11:39:00 AM)HDL:  53.2 (05/31/2007 11:39:00 AM)LDL:  90 (05/31/2007 11:39:00 AM)Triglycerides:  Last Liver profileSGOT:  21 (05/31/2007 11:39:00 AM)SPGT:  21 (05/31/2007 11:39:00 AM)T. Bili:  0.7 (05/31/2007 11:39:00 AM)Alk Phos:  109 (05/31/2007 11:39:00 AM) )-- well controlled on zocor and diet   is due for labs- expect stable , good diet    HTN- very good control 120/70 today   has itchy spot on L leg to check   is bloated after she eats constipation is problems   colonosc aden polyp in 07- is due in 5/10 for follow up   pemphigoid - methotrexate/prednisone  now is DM from that- on metformin is eating well- really good about DM diet  some exercise  AIC 7.4 earlier this mo - ? she does not check it  last opthy- was very frequent (trouble with eyes with pemphigoid) -- last visit was 2 weeks ago -- sees Dr Dorcas Mcmurray   now gets bruised and bumps from even little trauma on current prednisone is now cutting dose - to every other day 30/10   has had more incontinence problems - sees DR Achilles Dunk , will be doing test / ? cath -- consider tack bladder  has not had dexa -- pt is worried about time/schedule  wants to schedule later in year  is taking her ca and vit D   Allergies: 1)  ! * Tetanus 2)  ! Hydrocodone 3)  ! * Tessalon Pearles 4)  ! * Shrimp ? 5)  Lipitor  Past History:  Past Surgical History:    Hysterectomy    Oophorectomy    LAD angioplasty (1995)    Colonoscopy- polyps (1995)    Cardiolite (08/2001)    Nasal biopsy    EGD- HH, esoph stricture- dilated (05/2005)    Colonoscopy- diverticulosis, polyp    Cataract extraction    5/09 CT of chest- granulomas in upper lobe of lung and liver      (07/11/2007)  Family History:    Father: deceased- lung cancer    Mother: CVA, alzheimer's    Siblings: 1 brother with HTN, 1 sister    son with esoph cancer     (03/14/2007)  Social History:    Marital Status: Married (husband is fighting cancer)    Children: 2    Occupation: retired    exercises on stationary bike/walks    Never Smoked    Alcohol use-no     (10/08/2007)  Past Medical History:    Allergic rhinitis    Colonic polyps, hx of    Coronary artery disease    GERD    Hyperlipidemia    Hypertension    Urinary incontinence- overactive bladder    5/09 lichen sclerosis (dx by gyn after  abn pap)    Cicatricial Pemphigoid, autoimmune d/o, affecting eyes, mouth, throat now (Dx [facility])    lichen sclerosis - vulvar             GYN--Dr Kincius    Opthalmology--Dr. Dingledein    urol- [facility]    - Dr Trisha Mangle [address] [facility])- for pemphigoid   Review of Systems General:  Denies fatigue, fever, loss of appetite, and malaise. Eyes:  Complains of eye irritation; denies blurring and eye pain. ENT:  Denies sinus pressure and sore throat. CV:  Denies chest pain or discomfort, palpitations, shortness of breath with exertion, and swelling of feet. Resp:  Denies cough and wheezing. GI:  Denies abdominal pain, bloody stools, and change in bowel habits. GU:  Complains of incontinence and urinary frequency. MS:  Denies joint pain, joint redness, and joint swelling. Derm:  Complains of dryness, itching, and poor wound healing; denies rash. Neuro:  Denies numbness, tingling, and weakness. Psych:  Denies anxiety and depression. Endo:  Complains of excessive urination; denies excessive thirst. Heme:   Complains of abnormal bruising; denies bleeding and fevers.  Physical Exam  General:  moon facies, NAD Head:  normocephalic, atraumatic, and no abnormalities observed.   Eyes:  vision grossly intact, pupils equal, pupils round, and pupils reactive to light.  no conjunctival pallor, injection or icterus  Ears:  R ear normal and L ear normal.   Nose:  no nasal discharge.   Mouth:  pharynx pink and moist.   Neck:  supple with full rom and no masses or thyromegally, no JVD or carotid bruit  Chest Wall:  No deformities, masses, or tenderness noted. Breasts:  No mass, nodules, thickening, tenderness, bulging, retraction, inflamation, nipple discharge or skin changes noted.   Lungs:  Normal respiratory effort, chest expands symmetrically. Lungs are clear to auscultation, no crackles or wheezes. Heart:  Normal rate and regular rhythm. S1 and S2 normal without gallop, murmur, click, rub or other extra sounds. Abdomen:  Bowel sounds positive,abdomen soft and non-tender without masses, organomegaly or hernias noted. no renal bruits  Msk:  No deformity or scoliosis noted of thoracic or lumbar spine.   no acute joint changes  Pulses:  R and L carotid,radial,femoral,dorsalis pedis and posterior tibial pulses are full and equal bilaterally Extremities:  No clubbing, cyanosis, edema, or deformity noted with normal full range of motion of all joints.   Neurologic:  sensation intact to light touch, gait normal, and DTRs symmetrical and normal.  no tremor  Skin:  Intact without suspicious lesions or rashes many bruises/ few abrasions bilat forearms  Cervical Nodes:  No lymphadenopathy noted Axillary Nodes:  No palpable lymphadenopathy Inguinal Nodes:  No significant adenopathy Psych:  normal affect, talkative and pleasant   Diabetes Management Exam:    Foot Exam (with socks and/or shoes not present):       Sensory-Pinprick/Light touch:          Left medial foot (L-4): normal          Left dorsal foot  (L-5): normal          Left lateral foot (S-1): normal          Right medial foot (L-4): normal          Right dorsal foot (L-5): normal          Right lateral foot (S-1): normal       Sensory-Monofilament:          Left foot: normal  Right foot: normal       Inspection:          Left foot: normal          Right foot: normal       Nails:          Left foot: normal          Right foot: normal    Eye Exam:       Eye Exam done elsewhere          Date: 05/11/2008          Results: normal          Done by: Dingeldein   Impression & Recommendations:  Problem # 1:  DIABETES MELLITUS, UNCONTROLLED (ICD-250.02) Assessment Unchanged  caused by low dose prednisone will continue to follow- with metformin AIC is not due until june  other labs today- disc foot and eye care (is utd) if no [facility] in [facility] -consider DMteach/ diet inst/glucose supplies  Her updated medication list for this problem includes:    Lisinopril 40 Mg Tabs (Lisinopril) .Marland Kitchen... Take 1 tablet by mouth once a day    Metformin Hcl 500 Mg Tabs (Metformin hcl) .Marland Kitchen... 1/2 tab by mouth two times a day  Labs Reviewed: Creat: 0.7 (04/28/2008)    Reviewed HgBA1c results: 7.4 (04/28/2008)  Problem # 2:  HYPERTENSION (ICD-401.9) Assessment: Unchanged  bp continues to be in very good control  no change in meds disc healthy diet (low simple sugar/ choose complex carbs/ low sat fat) diet and exercise in detail  Her updated medication list for this problem includes:    Diltiazem Hcl Coated Beads 300 Mg Cp24 (Diltiazem hcl coated beads) .Marland Kitchen... Take one by mouth once a day    Lisinopril 40 Mg Tabs (Lisinopril) .Marland Kitchen... Take 1 tablet by mouth once a day  Orders: Venipuncture (04540) TLB-Lipid Panel (80061-LIPID) TLB-BMP (Basic Metabolic Panel-BMET) (80048-METABOL) TLB-Hepatic/Liver Function Pnl (80076-HEPATIC) TLB-TSH (Thyroid Stimulating Hormone) (84443-TSH) TLB-Hemoglobin (Hgb) (85018-HGB)  BP today: 120/70 Prior BP:  130/76 (04/28/2008)  Labs Reviewed: K+: 4.3 (04/28/2008) Creat: : 0.7 (04/28/2008)   Chol: 161 (05/31/2007)   HDL: 53.2 (05/31/2007)   LDL: 90 (05/31/2007)   TG: 90 (05/31/2007)  Problem # 3:  HYPERLIPIDEMIA (ICD-272.4) Assessment: Unchanged  check lipids today- have been well controlled with diet and statin  Her updated medication list for this problem includes:    Zocor 20 Mg Tabs (Simvastatin) .Marland Kitchen... Take one by mouth once a day  Orders: Venipuncture (98119) TLB-Lipid Panel (80061-LIPID) TLB-BMP (Basic Metabolic Panel-BMET) (80048-METABOL) TLB-Hepatic/Liver Function Pnl (80076-HEPATIC) TLB-TSH (Thyroid Stimulating Hormone) (84443-TSH) TLB-Hemoglobin (Hgb) (85018-HGB)  Labs Reviewed: SGOT: 21 (05/31/2007)   SGPT: 21 (05/31/2007)   HDL:53.2 (05/31/2007), 61.2 (03/28/2006)  LDL:90 (05/31/2007), 72 (03/28/2006)  Chol:161 (05/31/2007), 150 (03/28/2006)  Trig:90 (05/31/2007), 85 (03/28/2006)  Problem # 4:  POSTMENOPAUSAL STATUS (ICD-V49.81) Assessment: New overdue for dexa - esp in light of steroid use  order it  disc opt intake of ca and vit D Orders: Radiology Referral (Radiology)  Problem # 5:  PEMPHIGOID (ICD-694.5) Assessment: Improved continue to follow- clinically imp with MTX and prednisone  Problem # 6:  OTHER SCREENING MAMMOGRAM (ICD-V76.12) Assessment: Comment Only annual mammogram scheduled adv pt to continue regular self breast exams non remarkable breast exam today  Orders: Radiology Referral (Radiology)  Problem # 7:  COLONIC POLYPS, HX OF (ICD-V12.72) Assessment: Comment Only  pt is due screen colonosc this may- she wants to schedule this herself  some constipation- no other  changes   Complete Medication List: 1)  Omeprazole 20 Mg Tbec (Omeprazole) .... Take 1 tablet by mouth every morning 2)  Diltiazem Hcl Coated Beads 300 Mg Cp24 (Diltiazem hcl coated beads) .... Take one by mouth once a day 3)  Lisinopril 40 Mg Tabs (Lisinopril) .... Take 1 tablet  by mouth once a day 4)  Zocor 20 Mg Tabs (Simvastatin) .... Take one by mouth once a day 5)  Folic Acid 1 Mg Tabs (Folic acid) .... Take 1 tablet by mouth two times a day 6)  Methotrexate 2.5 Mg Tabs (Methotrexate sodium) .... Takes 5 tablets per day once weekly 7)  Prednisone 10 Mg Tabs (Prednisone) .... Alternating 30mg  every other day and 7.5mg  every other day 8)  Imipramine Hcl 25 Mg Tabs (Imipramine hcl) .... Take 1 tablet by mouth once a day every evening 9)  Omnipred 1 % Susp (Prednisolone acetate) .... One drop in each eye two times a day 10)  Timolol Maleate 0.25 % Soln (Timolol maleate) .... One drop in each eye once daily 11)  Metformin Hcl 500 Mg Tabs (Metformin hcl) .... 1/2 tab by mouth two times a day  Patient Instructions: 1)  due to colon polyps - you are due colonoscopy in may 2010-- please call us to schedule that when ready 2)  tetnus shot today 3)  we will schedule dexa at check out - later in the year  4)  we will schedule mammogram after may 5th  5)  follow up with your gyn as planned  6)  labs today Prescriptions: METFORMIN HCL 500 MG TABS (METFORMIN HCL) 1/2 tab by mouth two times a day  #90 x 3   Entered and Authorized by:   Judith Part MD   Signed by:   Judith Part MD on 06/02/2008   Method used:   Print then Give to Patient   RxID:   [id number]       Prior Medications (reviewed today): OMEPRAZOLE 20 MG  TBEC (OMEPRAZOLE) Take 1 tablet by mouth every morning DILTIAZEM HCL COATED BEADS 300 MG CP24 (DILTIAZEM HCL COATED BEADS) Take one by mouth once a day LISINOPRIL 40 MG TABS (LISINOPRIL) Take 1 tablet by mouth once a day ZOCOR 20 MG  TABS (SIMVASTATIN) Take one by mouth once a day FOLIC ACID 1 MG TABS (FOLIC ACID) Take 1 tablet by mouth two times a day METHOTREXATE 2.5 MG TABS (METHOTREXATE SODIUM) Takes 5 tablets per day once weekly PREDNISONE 10 MG TABS (PREDNISONE) alternating 30mg  every other day and 7.5mg  every other day IMIPRAMINE  HCL 25 MG TABS (IMIPRAMINE HCL) Take 1 tablet by mouth once a day every evening OMNIPRED 1 % SUSP (PREDNISOLONE ACETATE) One drop in each eye two times a day TIMOLOL MALEATE 0.25 % SOLN (TIMOLOL MALEATE) One drop in each eye once daily METFORMIN HCL 500 MG TABS (METFORMIN HCL) 1/2 tab by mouth two times a day Current Allergies (reviewed today): ! * TETANUS ! HYDROCODONE ! * TESSALON PEARLES ! * SHRIMP ? LIPITOR Current Medications (including changes made in today's visit):  OMEPRAZOLE 20 MG  TBEC (OMEPRAZOLE) Take 1 tablet by mouth every morning DILTIAZEM HCL COATED BEADS 300 MG CP24 (DILTIAZEM HCL COATED BEADS) Take one by mouth once a day LISINOPRIL 40 MG TABS (LISINOPRIL) Take 1 tablet by mouth once a day ZOCOR 20 MG  TABS (SIMVASTATIN) Take one by mouth once a day FOLIC ACID 1 MG TABS (FOLIC ACID) Take 1 tablet by mouth two  times a day METHOTREXATE 2.5 MG TABS (METHOTREXATE SODIUM) Takes 5 tablets per day once weekly PREDNISONE 10 MG TABS (PREDNISONE) alternating 30mg  every other day and 7.5mg  every other day IMIPRAMINE HCL 25 MG TABS (IMIPRAMINE HCL) Take 1 tablet by mouth once a day every evening OMNIPRED 1 % SUSP (PREDNISOLONE ACETATE) One drop in each eye two times a day TIMOLOL MALEATE 0.25 % SOLN (TIMOLOL MALEATE) One drop in each eye once daily METFORMIN HCL 500 MG TABS (METFORMIN HCL) 1/2 tab by mouth two times a day      Appended Document: CHECK UP/CLE    Clinical Lists Changes  Orders: Added new Service order of Tetanus Toxoid w/Dx [phone]) - Signed Added new Service order of Admin 1st Vaccine (98119) - Signed Observations: Added new observation of TD BOOST VIS: 01/16/07 version given June 03, 2008. (06/03/2008 9:22) Added new observation of TD BOOSTERLO: [id number] (06/03/2008 9:22) Added new observation of TD BOOST EXP: 07/23/2009 (06/03/2008 9:22) Added new observation of TD BOOSTERBY: Natasha Chavers (06/03/2008 9:22) Added new observation of TD BOOSTERRT: IM  (06/03/2008 9:22) Added new observation of TDBOOSTERDSE: 0.5 ml (06/03/2008 9:22) Added new observation of TD BOOSTERMF: Sanofi Pasteur (06/03/2008 9:22) Added new observation of TD BOOST SIT: left deltoid (06/03/2008 9:22) Added new observation of TD BOOSTER: Td (06/03/2008 9:22)       Tetanus/Td Vaccine    Vaccine Type: Td    Site: left deltoid    Mfr: Sanofi Pasteur    Dose: 0.5 ml    Route: IM    Given by: Liane Comber    Exp. Date: 07/23/2009    Lot #: [id number]    VIS given: 01/16/07 version given June 03, 2008.

## 2010-04-01 NOTE — Letter (Signed)
Summary: Encounter Notice/[facility]  Encounter Notice/[facility]   Imported By: Lanelle Bal 03/18/2009 08:20:07  _____________________________________________________________________  External Attachment:    Type:   Image     Comment:   External Document

## 2010-04-01 NOTE — Assessment & Plan Note (Signed)
Summary: SORES MOUTH,THROAT/CLE   Vital Signs:  Patient Profile:   70 Years Old Female Height:     60.25 inches (153.04 cm) Weight:      145 pounds (65.91 kg) Temp:     98.2 degrees F (36.78 degrees C) oral Pulse rate:   76 / minute Pulse rhythm:   regular BP sitting:   120 / 80  (left arm) Cuff size:   regular  Vitals Entered By: Silas Sacramento (November 14, 2007 3:26 PM)                 Chief Complaint:  Sores in mouth and throat.  History of Present Illness: 70 yo white female with a history of ocular pemphigoid originally diagnosed at [facility], now treated regularly by Dr. Adele Schilder at [facility] in [place] who presents with worsening of oral sores, periodontal disease, and pain going down her throat. She is currently using ocular steroids who she has been prescribed by Dr. Adele Schilder.    the oral manifestation of her disease is not new however it is progressively getting worse, and at this point she has severe periodontal disease and oral ulceration throughout her gumline and into her upper palate. Currently she is quite concerned because the pain and lesions have spread to the back of her throat and she believes in to her esophagus.  she is not taking any global medication to combat the advance of this disease.    Current Allergies: ! * TETANUS ! HYDROCODONE ! * TESSALON PEARLES ! * SHRIMP ? LIPITOR  Past Medical History:    Reviewed history from 07/14/2007 and no changes required:       Allergic rhinitis       Colonic polyps, hx of       Coronary artery disease       GERD       Hyperlipidemia       Hypertension       Urinary incontinence- overactive bladder       5/09 lichen sclerosis (dx by gyn after abn pap)       Cicatricial Pemphigoid, autoimmune d/o, affecting eyes, mouth, throat now (Dx [facility])                     GYN--Dr Kincius       Opthalmology--Dr. Dingledein  Past Surgical History:    Reviewed history from 07/11/2007 and no changes required:       Hysterectomy       Oophorectomy       LAD angioplasty (1995)       Colonoscopy- polyps (1995)       Cardiolite (08/2001)       Nasal biopsy       EGD- HH, esoph stricture- dilated (05/2005)       Colonoscopy- diverticulosis, polyp       Cataract extraction       5/09 CT of chest- granulomas in upper lobe of lung and liver    Family History:    Reviewed history from 03/14/2007 and no changes required:       Father: deceased- lung cancer       Mother: CVA, alzheimer's       Siblings: 1 brother with HTN, 1 sister       son with esoph cancer  Social History:    Reviewed history from 10/08/2007 and no changes required:       Marital Status: Married (husband is fighting cancer)  Children: 2       Occupation: retired       exercises on stationary bike/walks       Never Smoked       Alcohol use-no    Review of Systems      See HPI  General      Denies chills, fever, and sweats.  ENT      Complains of difficulty swallowing and sore throat.      Denies hoarseness.  Resp      Denies chest discomfort, cough, and shortness of breath.   Physical Exam  General:     Well-developed,well-nourished,in no acute distress; alert,appropriate and cooperative throughout examination Head:     normocephalic and atraumatic.   Mouth:     Severe periodontal disease with oral ulceration along gum lines, upper and lower. Lesions present along upper palate, reddened and inflammatory with some presence on tonsils. (much lesser degree) No dequammation of tissue.  Neck:     Moves neck without difficult, no LAD.    Impression & Recommendations:  Problem # 1:  PEMPHIGOID (ICD-694.5) Assessment: Deteriorated Dx. with Cicatricial Pemphigoid, now with extension of symptoms beyond eyes alone. progression of symptoms over time.  >45 minutes spent in total face to face time with the patient with >50% spent in coordination of care. I reviewed several papers on this condition, and I am  attempting to refer her to a specialist who could help slow the progression of this disease. I discussed this case with our clinical care coordinator who will help facilitate these referrals, and I hope that the dental faculty at [facility] will be able to offer some assistance with her dental manifestations of this disorder. I am also going to call some colleagues on the telephone who work in Dermatology, because it seems from my reading that medications specifically designed to alter the course of this disease would be in her best interests, but I am unclear who should direct this care. I have the number of Dr. Dondra Prader from [facility] teaching faculty, and I will call him in the morning. Dr. Andres Labrum from [facility] is one of the national experts in bullous pemphigoid, and I may ask his opinion, too.  Orders: Dental Referral (Dentist) ENT Referral (ENT)   Problem # 2:  ORAL ULCER (ICD-528.9) Assessment: Deteriorated  Orders: Dental Referral (Dentist) ENT Referral (ENT)   Problem # 3:  THROAT PAIN (ICD-784.1) Assessment: New Likely extension of pemphigoid into throat / tonsils  Pt is going to see Dr. Erline Hau. I appreciate his assistance in determing the extent of disease and appreciate suggestions.  Complete Medication List: 1)  Omeprazole 20 Mg Tbec (Omeprazole) .Marland Kitchen.. 1 by mouth qam 2)  Diltiazem Hcl Coated Beads 300 Mg Cp24 (Diltiazem hcl coated beads) .... Take one by mouth once a day 3)  Lisinopril 40 Mg Tabs (Lisinopril) .... Take 1 tablet by mouth once a day 4)  Omnipred 1 % Susp (Prednisolone acetate) .... Use one gtt ou daily 5)  Zocor 20 Mg Tabs (Simvastatin) .... Take one by mouth once a day 6)  Guaifenesin-codeine 100-10 Mg/61ml Liqd (Guaifenesin-codeine) .Marland Kitchen.. 1-2 tsp by mouth q 6 hours as needed severe cough 7)  Vesicare 5 Mg Tabs (Solifenacin succinate) .Marland Kitchen.. 1 by mouth once daily 8)  Clobetasol Propionate 0.05 % Oint (Clobetasol propionate) .... Daily 9)  Zithromax  Z-pak 250 Mg Tabs (Azithromycin) .... Take by mouth as directed 10)  Lidocaine Viscous 2 % Soln (Lidocaine hcl) .Marland KitchenMarland KitchenMarland Kitchen  Swish and swallow as needed   Patient Instructions: 1)  Go to [facility] to get help with referrals 2)  If you acutely worsen or have trouble breathing, go to ER   Prescriptions: LIDOCAINE VISCOUS 2 % SOLN (LIDOCAINE HCL) Swish and swallow as needed  #100 mL x 1   Entered and Authorized by:   Hannah Beat MD   Signed by:   Hannah Beat MD on 11/14/2007   Method used:   [facility]        [facility]       [address]       [place], [place]  [address]       Ph: [phone]       Fax: [phone]   RxID:   [id number]  ]  Appended Document: SORES MOUTH,THROAT/CLE I just spoke to the patient who is feeling better.  She saw Dr. Jenne Campus this morning, who placed her on a prednisone taper and gave some [facility] mouthwash.  I spoke to one of the Dentists at [employer] school this morning, and she said that one of their faculty has a research interest in pemphigoid. They would be happy to see her at the [facility].  [facility]lee [facility], can you please FAX my prior referral to [facility] at [facility] # [phone]. Please send my note from yesterday, too, along with Ms. Drenning contact information. They will contact her directly. They would like to have a copy of the note from Dr. Mikey Bussing office and a note from Dr. Deitra Mayo office if that is possible, too.  Thanks very much.

## 2010-04-01 NOTE — Letter (Signed)
Summary: Dr.Donna [facility],Note  Dr.Donna [facility],Note   Imported By: Beau Fanny 03/09/2010 15:10:49  _____________________________________________________________________  External Attachment:    Type:   Image     Comment:   External Document

## 2010-04-01 NOTE — Assessment & Plan Note (Signed)
Summary: MED REFILL/[facility]   Vital Signs:  Patient profile:   70 year old female Weight:      149 pounds Temp:     97.5 degrees F oral Pulse rate:   72 / minute Pulse rhythm:   regular BP sitting:   140 / 72  (left arm) Cuff size:   regular  Vitals Entered By: Lowella Petties CMA (February 05, 2009 8:23 AM) CC: Refill meds- omeprazole.  Also needs something for drainage in her throat.   History of Present Illness: here for f/u of GERD - is on omeprazole  her symptoms are severe if she even stops it for 2 days  does watch diet- but drinks coffee in am and at noon    also has drainage in her throat she cannot get rid of  clears her throat constantly with thick d/c  yellow stuff runs down back of throat and she spits it out too  does not think it is from reflux  is worse at night  no sneezing - but does have allergies  does feel like she has sinus infection- some bad pain under eyes with pressure and congestion   saline nasal spray does not help   wt is up 4 lb today   lab yesterday for lipids trig 221, HDL 50 and LDL 86 feels back with 40 mg dose- and requests to go down to the 20 mg -- cannot tolerate the symptoms  knows this will not fully control cholesterol   diet - is overall pretty good -- really watches it  is avoiding sugar and fats  has been getting up in the night - and eats cheerios   is not sleeping well at all  tried a benadryl and this did not help  is gradually cutting her prednisone  a lot of side eff  penphigoid is better   bp up a bit today   is trying some different things for bladder overactivity  stopped her ditropan  now is on enablex -- for a short course-- is working a bit better  imipramine did not help and did not help sleep either   Allergies: 1)  ! * Tetanus 2)  ! Hydrocodone 3)  ! * Tessalon Pearles 4)  ! * Shrimp ? 5)  ! Asa 6)  Lipitor  Past History:  Past Medical History: Last updated: 06/02/2008 Allergic rhinitis Colonic  polyps, hx of Coronary artery disease GERD Hyperlipidemia Hypertension Urinary incontinence- overactive bladder 5/09 lichen sclerosis (dx by gyn after abn pap) Cicatricial Pemphigoid, autoimmune d/o, affecting eyes, mouth, throat now (Dx [facility]) lichen sclerosis - vulvar    GYN--Dr Kincius Opthalmology--Dr. Dingledein urol- [place] - Dr Tr[facility] for pemphigoid   Past Surgical History: Last updated: 08/28/2008 Hysterectomy Oophorectomy LAD angioplasty (1995) Colonoscopy- polyps (1995) Cardiolite (08/2001) Nasal biopsy EGD- HH, esoph stricture- dilated (05/2005) Colonoscopy- diverticulosis, polyp Cataract extraction 5/09 CT of chest- granulomas in upper lobe of lung and liver  5/10 dexa normal 6/10 colonoscopy- polyp  Family History: Last updated: January 16, 2009 Father: deceased- lung cancer Mother: CVA, alzheimer's Siblings: 1 brother with HTN, 1 sister son with esoph cancer-- passed away   Social History: Last updated: 10/08/2007 Marital Status: Married (husband is fighting cancer) Children: 2 Occupation: retired exercises on stationary bike/walks Never Smoked Alcohol use-no  Risk Factors: Smoking Status: never (05/31/2007)  Review of Systems General:  Denies fatigue, fever, loss of appetite, and malaise. Eyes:  Denies blurring, eye irritation, and eye pain. ENT:  Complains  of hoarseness, nasal congestion, postnasal drainage, and sinus pressure; denies ear discharge, earache, and sore throat. CV:  Denies chest pain or discomfort, palpitations, shortness of breath with exertion, and swelling of feet. Resp:  Complains of cough; denies pleuritic, shortness of breath, sputum productive, and wheezing. GI:  Denies abdominal pain, diarrhea, and loss of appetite. GU:  Complains of incontinence; denies dysuria and hematuria. Derm:  Denies itching, lesion(s), poor wound healing, and rash. Neuro:  Denies numbness and tingling. Psych:  mood is ok . Endo:  Denies  excessive thirst and excessive urination. Heme:  Denies abnormal bruising and bleeding.  Physical Exam  General:  overweight but generally well appearing - moon facies  Head:  normocephalic, atraumatic, and no abnormalities observed.  bilat ethmoid and maxillary sinus tenderness  Eyes:  vision grossly intact, pupils equal, pupils round, pupils reactive to light, and no injection.   Ears:  R ear normal and L ear normal.   Nose:  nares are congested and very dry Mouth:  pharynx pink and moist, no erythema, and no exudates.   Neck:  supple with full rom and no masses or thyromegally, no JVD or carotid bruit  Chest Wall:  No deformities, masses, or tenderness noted. Lungs:  Normal respiratory effort, chest expands symmetrically. Lungs are clear to auscultation, no crackles or wheezes. Heart:  Normal rate and regular rhythm. S1 and S2 normal without gallop, murmur, click, rub or other extra sounds. Abdomen:  Bowel sounds positive,abdomen soft and non-tender without masses, organomegaly or hernias noted. no renal bruits  Msk:  No deformity or scoliosis noted of thoracic or lumbar spine.   Pulses:  R and L carotid,radial,femoral,dorsalis pedis and posterior tibial pulses are full and equal bilaterally Extremities:  No clubbing, cyanosis, edema, or deformity noted with normal full range of motion of all joints.   Skin:  Intact without suspicious lesions or rashes Cervical Nodes:  No lymphadenopathy noted Psych:  normal affect, talkative and pleasant    Impression & Recommendations:  Problem # 1:  HYPERLIPIDEMIA (ICD-272.4) Assessment Improved  pt does not tolerate 40 of zocor and so need to dec to 20  disc eff of chol -- and inability to get to goal at that dose- pt voiced understanding  lab 3 mo and f/u  stressed imp of low sat fat diet  Her updated medication list for this problem includes:    Zocor 20 Mg Tabs (Simvastatin) .Marland Kitchen... 1 by mouth once daily  Labs Reviewed: SGOT: 20  (12/23/2008)   SGPT: 31 (12/23/2008)   HDL:60.00 (12/23/2008), 48.70 (06/02/2008)  LDL:105 (12/23/2008), 95 (16/11/9602)  Chol:192 (12/23/2008), 172 (06/02/2008)  Trig:134.0 (12/23/2008), 144.0 (06/02/2008)  Problem # 2:  GERD (ICD-530.81) Assessment: Unchanged very good control with omeprazole -- continue that and stop caffiene Her updated medication list for this problem includes:    Omeprazole 20 Mg Tbec (Omeprazole) .Marland Kitchen... Take 1 tablet by mouth every morning  Problem # 3:  SINUSITIS - ACUTE-NOS (ICD-461.9) Assessment: New with sinus pain and purulent drainage  will tx with augmentin and update  also recommend nasal saline spray Her updated medication list for this problem includes:    Augmentin 875-125 Mg Tabs (Amoxicillin-pot clavulanate) .Marland Kitchen... 1 by mouth two times a day for 10 days for sinus infection  Problem # 4:  INSOMNIA (ICD-780.52) Assessment: New factors include age and prednisone and caffiene needs to quit coffee- stressed this disc sleep hygiene-- given handout from [facility]  trial of ambien 2 times per week with caution Her  updated medication list for this problem includes:    Ambien 10 Mg Tabs (Zolpidem tartrate) .Marland Kitchen... 1/2 to 1 by mouth at bedtime as needed insomnia  Complete Medication List: 1)  Omeprazole 20 Mg Tbec (Omeprazole) .... Take 1 tablet by mouth every morning 2)  Diltiazem Hcl Coated Beads 300 Mg Cp24 (Diltiazem hcl coated beads) .... Take one by mouth once a day 3)  Lisinopril 40 Mg Tabs (Lisinopril) .... Take 1 tablet by mouth once a day 4)  Zocor 20 Mg Tabs (Simvastatin) .Marland Kitchen.. 1 by mouth once daily 5)  Prednisone 10 Mg Tabs (Prednisone) .... Take 30 mg's daily 6)  Omnipred 1 % Susp (Prednisolone acetate) .... One drop in each eye two times a day 7)  Timolol Maleate 0.25 % Soln (Timolol maleate) .... One drop in each eye once daily 8)  Metformin Hcl 500 Mg Tabs (Metformin hcl) .... 1/2 tab by mouth two times a day 9)  Azathioprine 50 Mg Tabs  (Azathioprine) .... Take one and a half  by mouth daily 10)  Glucometer  .... To check sugar once daily and as needed for diabetes 250.0 11)  Glucose Test Strips  .... To check sugar once daily and as needed for diabetes 250.0 12)  Augmentin 875-125 Mg Tabs (Amoxicillin-pot clavulanate) .Marland Kitchen.. 1 by mouth two times a day for 10 days for sinus infection 13)  Ambien 10 Mg Tabs (Zolpidem tartrate) .... 1/2 to 1 by mouth at bedtime as needed insomnia  Patient Instructions: 1)  cut your zocor dose to 20 mg  2)  take the augmentin for sinus infection 3)  also use nasal saline or netti pot for congestions  4)  if symptoms do not improve - let me know 5)  try ambien for sleep 1-2 times per week maximum and use great caution  6)  stop all caffiene 7)  exercise every day 8)  cancel jan appt with me 9)  schedule fasting labs in 3  mo lipid/ast/alt/AIC /renal 250.0 and then follow up Prescriptions: AMBIEN 10 MG TABS (ZOLPIDEM TARTRATE) 1/2 to 1 by mouth at bedtime as needed insomnia  #30 x 0   Entered and Authorized by:   Judith Part MD   Signed by:   Judith Part MD on 02/05/2009   Method used:   Print then Give to Patient   RxID:   [id number] AUGMENTIN 875-125 MG TABS (AMOXICILLIN-POT CLAVULANATE) 1 by mouth two times a day for 10 days for sinus infection  #20 x 0   Entered and Authorized by:   Judith Part MD   Signed by:   Judith Part MD on 02/05/2009   Method used:   Print then Give to Patient   RxID:   [id number] OMEPRAZOLE 20 MG  TBEC (OMEPRAZOLE) Take 1 tablet by mouth every morning  #90 x 3   Entered and Authorized by:   Judith Part MD   Signed by:   Judith Part MD on 02/05/2009   Method used:   Print then Give to Patient   RxID:   [id number] ZOCOR 20 MG TABS (SIMVASTATIN) 1 by mouth once daily  #90 x 3   Entered and Authorized by:   Judith Part MD   Signed by:   Judith Part MD on 02/05/2009   Method used:   Print then Give to Patient    RxID:   [id number]   Prior Medications (reviewed today): DILTIAZEM HCL COATED BEADS 300 MG CP24 (  DILTIAZEM HCL COATED BEADS) Take one by mouth once a day LISINOPRIL 40 MG TABS (LISINOPRIL) Take 1 tablet by mouth once a day PREDNISONE 10 MG TABS (PREDNISONE) take 30 mg's daily OMNIPRED 1 % SUSP (PREDNISOLONE ACETATE) One drop in each eye two times a day TIMOLOL MALEATE 0.25 % SOLN (TIMOLOL MALEATE) One drop in each eye once daily METFORMIN HCL 500 MG TABS (METFORMIN HCL) 1/2 tab by mouth two times a day AZATHIOPRINE 50 MG TABS (AZATHIOPRINE) take one and a half  by mouth daily GLUCOMETER () to check sugar once daily and as needed for diabetes 250.0 GLUCOSE TEST STRIPS () to check sugar once daily and as needed for diabetes 250.0 Current Allergies: ! * TETANUS ! HYDROCODONE ! * TESSALON PEARLES ! * SHRIMP ? ! ASA LIPITOR

## 2010-04-01 NOTE — Assessment & Plan Note (Signed)
Summary: 3 WK F/U  DLO   Vital Signs:  Patient Profile:   70 Years Old Female Height:     60.25 inches Weight:      142 pounds Temp:     97.8 degrees F oral Pulse rate:   72 / minute Pulse rhythm:   regular BP sitting:   140 / 80  (left arm) Cuff size:   regular  Vitals Entered By: Lowella Petties (June 21, 2007 12:09 PM)                 Chief Complaint:  3 week follow up.  History of Present Illness: cough is still present- now is coughing up phlegm- one night she coughed for 2 hours no improvement at all off medicine phlegm is yellow  thinks sinus symptoms- head is stopping up /congestion mucinex DM did not stop coughing  no indigestion  took husband's hydrocodone for cough   labs were good   used the steroid med for 10 days on vulva- got better and now itching again  she thinks this may have to do with pemphigoid?  had to come in for rash - saw Willaim Sheng - all over itched severely took the clarinex- and it improved quite quickly  had eaten shrimp    Current Allergies: ! * TETANUS ! HYDROCODONE ! * TESSALON PEARLES ! * SHRIMP ? LIPITOR  Past Medical History:    Reviewed history from 03/14/2007 and no changes required:       Allergic rhinitis       Colonic polyps, hx of       Coronary artery disease       GERD       Hyperlipidemia       Hypertension       Urinary incontinence- overactive bladder  Past Surgical History:    Reviewed history from 03/13/2007 and no changes required:       Hysterectomy       Oophorectomy       LAD angioplasty (1995)       Colonoscopy- polyps (1995)       Cardiolite (08/2001)       Nasal biopsy       EGD- HH, esoph stricture- dilated (05/2005)       Colonoscopy- diverticulosis, polyp       Cataract extraction    Risk Factors:  PAP Smear History:     Date of Last PAP Smear:  05/31/2007    Results:  abnormal    Review of Systems  General      Complains of fatigue.      Denies chills, fever, loss of appetite,  and weight loss.  CV      Denies chest pain or discomfort, palpitations, and shortness of breath with exertion.  Resp      Complains of cough.      Denies coughing up blood and pleuritic.  GI      Denies abdominal pain, change in bowel habits, indigestion, loss of appetite, nausea, and vomiting.  MS      Denies joint pain.  Derm      Denies changes in color of skin and rash.  Neuro      Denies numbness.  Psych      mood is ok   Endo      Denies excessive thirst and excessive urination.   Physical Exam  General:     Well-developed,well-nourished,in no acute distress; alert,appropriate and cooperative throughout examination Head:  normocephalic, atraumatic, and no abnormalities observed.  no sinus tenderness  Eyes:     vision grossly intact, pupils equal, pupils round, pupils reactive to light, and no injection.   Ears:     R ear normal and L ear normal.   Nose:     no nasal discharge and nasal dischargemucosal pallor.   Mouth:     pharynx pink and moist, no erythema, and no exudates.   Neck:     supple with full rom and no masses or thyromegally, no JVD or carotid bruit  Chest Wall:     No deformities, masses, or tenderness noted. Lungs:     harsh bs at bases without rales/rhonchi/wheeze or crackles Heart:     Normal rate and regular rhythm. S1 and S2 normal without gallop, murmur, click, rub or other extra sounds. Abdomen:     soft and non-tender.   Skin:     Intact without suspicious lesions or rashes Cervical Nodes:     No lymphadenopathy noted Psych:     normal affect, talkative and pleasant     Impression & Recommendations:  Problem # 1:  COUGH (ICD-786.2) Assessment: Deteriorated no imp with cessation of ace inhibitor- so will re start it  ? if allergic or poss reflux related due to producitve nature and harsh bs - will tx empirically with zithromax and check cxr tx all with otc antihistamine tussionex for cough as needed continue  PPI Orders: CXR- 2view (CXR)   Problem # 2:  ABNORMAL GLANDULAR PAPANICOLAOU SMEAR OF VAGINA (ICD-795.10) Assessment: New with irritation and now itching brief imp with steroid cream will ref to gyn for further eval and tx  Complete Medication List: 1)  Omeprazole 20 Mg Tbec (Omeprazole) .Marland Kitchen.. 1 by mouth qam 2)  Ditropan 5 Mg Tabs (Oxybutynin chloride) .... Take one by mouth once a day 3)  Diltiazem Hcl Coated Beads 300 Mg Cp24 (Diltiazem hcl coated beads) .... Take one by mouth once a day 4)  Lisinopril 40 Mg Tabs (Lisinopril) .... Take 1 tablet by mouth once a day 5)  Omnipred 1 % Susp (Prednisolone acetate) .... Use one gtt ou daily 6)  Zocor 20 Mg Tabs (Simvastatin) .... Take one by mouth once a day 7)  Tussionex Pennkinetic Er 8-10 Mg/64ml Lqcr (Chlorpheniramine-hydrocodone) .... 1/2 to 1 tsp at bedtime as needed cough 8)  Zithromax Z-pak 250 Mg Tabs (Azithromycin) .... Take by mouth as directed  Other Orders: Gynecologic Referral (Gyn)   Patient Instructions: 1)  start claritin or zyrtec over the counter 1 pill daily (10mg  ) 2)  take zithromax as directed for possible bronchitis 3)  we will schedule cxr- at check out  4)  we will refer you to GYN at check out 5)  take the tussionex with great caution due to hydrocodone 6)  update me if cough worsens or any fever 7)  start back on blood pressure medicine    Prescriptions: ZITHROMAX Z-PAK 250 MG  TABS (AZITHROMYCIN) take by mouth as directed  #1 pk x 0   Entered and Authorized by:   Judith Part MD   Signed by:   Judith Part MD on 06/21/2007   Method used:   Print then Give to Patient   RxID:   [id number] [facility] ER 8-10 MG/5ML  LQCR (CHLORPHENIRAMINE-HYDROCODONE) 1/2 to 1 tsp at bedtime as needed cough  #8 oz x 0   Entered and Authorized by:   Judith Part MD   Signed  by:   Judith Part MD on 06/21/2007   Method used:   Print then Give to Patient   RxID:    [id number]  ]  Preventive Care Screening  Pap Smear:    Date:  05/31/2007    Results:  abnormal

## 2010-04-01 NOTE — Progress Notes (Signed)
Summary: pt needs letter for insurance company  Phone Note Call from Patient   Caller: Patient Call For: Judith Part MD Summary of Call: Pt is asking for a letter for her insurance company stating that she takes her BP meds as maintainence.  She said she was told years ago that she didnt really need them but that she should continue to take them.  She is changing to a [employer] and they are asking for this.  Please call when ready. Initial call taken by: Lowella Petties CMA,  August 05, 2009 11:57 AM  Follow-up for Phone Call        she is taking these medications for HTN -- and they are maintenence meds for this problem  so to clarify- I write that she takes daily bp meds for mt of her HTN? - do I need to mention the names of the meds in the letter?   Follow-up by: Judith Part MD,  August 05, 2009 1:46 PM  Additional Follow-up for Phone Call Additional follow up Details #1::        Pt does not think you need to list the BP meds by name just that she is taking BP meds for maintenance of hypertension.Lewanda Rife LPN  August 05, 452 2:49 PM   ok - letter is done  Additional Follow-up by: Judith Part MD,  August 05, 2009 5:13 PM    Additional Follow-up for Phone Call Additional follow up Details #2::    Patient notified as instructed by telephone. Letter left at front desk. Lewanda Rife LPN  August 06, 979 5:25 PM

## 2010-04-01 NOTE — Assessment & Plan Note (Signed)
Summary: follow up/alc   Vital Signs:  Patient profile:   70 year old female Height:      60 inches Weight:      126.25 pounds BMI:     24.75 Temp:     98.1 degrees F oral Pulse rate:   80 / minute Pulse rhythm:   regular BP sitting:   124 / 72  (left arm) Cuff size:   large  Vitals Entered By: Lewanda Rife LPN (Feb 13, 2010 10:17 AM) CC: follow-up visit   History of Present Illness: here for f/u of inc LFTs   she is being tx for [facility]emphigoid at [facility] that is worsening  failed imuran, methotrexate or dapsone -- various side eff  inc dose of prednisone with time now aff conjunctiva  considering cyclophosphamide -- but her lfts are inc and ? why  had ? hepatitis in 1970s  per pt was in 1975-- thinks she got it from her husband who ate something bad-- hep A (she thinks so )  was sick over 1 mo-- jaundice and vomiting  got better  no suspicion of drug abuse/ sexual transmission   not overwt  does not drink any alcohol  takes tylenol very infrequently   is on prednisone and it is driving her crazy -- is on 30 mg  is making her nervous and fidgity    no abd pain or vomitig or nausea  no jaundice is constipated some times   she has been on crestor for several mo -- was prev on zocor (from Dr Daleen Squibb)   bp good today   needs w/u of transaminases  labs incl uric acid/ sr/ amylase / retic ct/ LDH/ C reactive prot/ hepatitis prof, ceruloplasmin , ferritin   Allergies: 1)  ! * Tetanus 2)  ! Hydrocodone 3)  ! * Tessalon Pearles 4)  ! * Shrimp ? 5)  ! Asa 6)  Lipitor  Past History:  Past Surgical History: Last updated: 10/09/2009 Hysterectomy Oophorectomy LAD angioplasty (1995) Colonoscopy- polyps (1995) Cardiolite (08/2001) Nasal biopsy EGD- HH, esoph stricture- dilated (05/2005) Colonoscopy- diverticulosis, polyp Cataract extraction 5/09 CT of chest- granulomas in upper lobe of lung and liver  5/10 dexa normal 6/10 colonoscopy- polyp 8/11 heart ablation/  catheter  Family History: Last updated: 02-13-2010 Father: deceased- lung cancer Mother: CVA, alzheimer's Siblings: 1 brother with HTN, 1 sister son with esoph cancer-- passed away  nephew- acute immune deficiency  Social History: Last updated: 10/08/2007 Marital Status: Married (husband is fighting cancer) Children: 2 Occupation: retired exercises on stationary bike/walks Never Smoked Alcohol use-no  Risk Factors: Smoking Status: never (05/31/2007)  Past Medical History: Allergic rhinitis Colonic polyps, hx of Coronary artery disease GERD Hyperlipidemia Hypertension Urinary incontinence- overactive bladder 5/09 lichen sclerosis (dx by gyn after abn pap) Cicatricial Pemphigoid, autoimmune d/o, affecting eyes, mouth, throat now (Dx [facility]) lichen sclerosis - vulvar  elevated lfts past hx of hepatitis    GYN--Dr Kincius Opthalmology--Dr. Dingledein urol- [facility] - Dr Trisha [facility]angle [facility])- for pemphigoid   Family History: Father: deceased- lung cancer Mother: CVA, alzheimer's Siblings: 1 brother with HTN, 1 sister son with esoph cancer-- passed away  nephew- acute immune deficiency  Review of Systems General:  Denies fatigue, loss of appetite, and malaise. Eyes:  Complains of eye irritation; denies blurring and double vision. ENT:  Denies nasal congestion and sore throat. CV:  Denies chest pain or discomfort, palpitations, and shortness of breath with exertion. Resp:  Denies cough, shortness of breath, and wheezing.  GI:  Denies abdominal pain, change in bowel habits, indigestion, nausea, vomiting, and yellowish skin color. GU:  Denies urinary frequency. MS:  Complains of stiffness; denies muscle weakness. Derm:  Denies itching, lesion(s), poor wound healing, and rash. Neuro:  Denies numbness and tingling. Psych:  mood is ok. Endo:  Denies excessive thirst and excessive urination. Heme:  Denies abnormal bruising and bleeding.  Physical Exam  General:   Well-developed,well-nourished,in no acute distress; alert,appropriate and cooperative throughout examination Head:  moon face noted  Eyes:  vision grossly intact, pupils equal, pupils round, and pupils reactive to light.  no conjunctival pallor, injection or icterus  Nose:  no nasal discharge.   Mouth:  pharynx pink and moist.   Neck:  supple with full rom and no masses or thyromegally, no JVD or carotid bruit  Chest Wall:  No deformities, masses, or tenderness noted. Lungs:  Normal respiratory effort, chest expands symmetrically. Lungs are clear to auscultation, no crackles or wheezes. Heart:  Normal rate and regular rhythm. S1 and S2 normal without gallop, murmur, click, rub or other extra sounds. Abdomen:  Bowel sounds positive,abdomen soft and non-tender without masses, organomegaly or hernias noted. no renal bruits  Msk:  no new joint changes Pulses:  R and L carotid,radial,femoral,dorsalis pedis and posterior tibial pulses are full and equal bilaterally Extremities:  no cce  Neurologic:  strength normal in all extremities, sensation intact to light touch, and DTRs symmetrical and normal.   Skin:  Intact without suspicious lesions or rashes Cervical Nodes:  No lymphadenopathy noted Inguinal Nodes:  No significant adenopathy Psych:  normal affect, talkative and pleasant    Impression & Recommendations:  Problem # 1:  TRANSAMINASES, SERUM, ELEVATED (ICD-790.4) in pt who recently finished imuran for pemphigoid and is also on crestor  lab today if nl - stop crestor and re check consider [place]  cannot start cytoxan until the liver prob is figured out there is remote hx of hep A Orders: Venipuncture (16109) TLB-BMP (Basic Metabolic Panel-BMET) (80048-METABOL) TLB-CBC Platelet - w/Differential (85025-CBCD) TLB-Hepatic/Liver Function Pnl (80076-HEPATIC) TLB-Amylase (82150-AMYL) TLB-Ferritin (82728-FER) TLB-A1C / Hgb A1C (Glycohemoglobin) (83036-A1C) T-Ceruloplasmin  (60454-09811) T-Hepatitis Acute Panel (91478-29562) T-Reticulocyte Count, Automated (13086-57846) T- * Misc. Laboratory test ([phone])  Problem # 2:  PEMPHIGOID (ICD-694.5) Assessment: Deteriorated considering cytoxan if lft elevation is resolved now on higher doses of prednisone is tolerating that ok so far   Complete Medication List: 1)  Omeprazole 20 Mg Tbec (Omeprazole) .... Take 1 tablet by mouth every morning 2)  Lisinopril 10 Mg Tabs (Lisinopril) .... One by mouth daily 3)  Crestor 10 Mg Tabs (Rosuvastatin calcium) .Marland Kitchen.. 1 once daily 4)  Omnipred 1 % Susp (Prednisolone acetate) .... One drop in each eye daily 5)  Metformin Hcl 500 Mg Tabs (Metformin hcl) .... 1/2 tab by mouth two times a day with meals 6)  Glucometer  .... To check sugar once daily and as needed for diabetes 250.0 7)  Glucose Test Strips  .... To check sugar once daily and as needed for diabetes 250.0 8)  Tylenol 325 Mg Tabs (Acetaminophen) .... Otc as directed. 9)  Vesicare 10 Mg Tabs (Solifenacin succinate) .... Take 1 tablet by mouth once a day 10)  Lumigan 0.01 % Soln (Bimatoprost) .... One drop each eye daily 11)  Prednisone 10 Mg Tabs (Prednisone) .... Three tablets by mouth daily  Patient Instructions: 1)  extensive liver tests today 2)  if all normal I will ask you to hold crestor for a while  3)  may consider liver ultrasound    Orders Added: 1)  Venipuncture [36415] 2)  TLB-BMP (Basic Metabolic Panel-BMET) [80048-METABOL] 3)  TLB-CBC Platelet - w/Differential [85025-CBCD] 4)  TLB-Hepatic/Liver Function Pnl [80076-HEPATIC] 5)  TLB-Amylase [82150-AMYL] 6)  TLB-Ferritin [82728-FER] 7)  TLB-A1C / Hgb A1C (Glycohemoglobin) [83036-A1C] 8)  T-Ceruloplasmin [82390-23850] 9)  T-Hepatitis Acute Panel [80074-22940] 10)  T-Reticulocyte Count, Automated [16109-60454] 11)  T- * Misc. Laboratory test [99999] 12)  Est. Patient Level IV [09811]    Current Allergies (reviewed today): ! * TETANUS !  HYDROCODONE ! * TESSALON PEARLES ! * SHRIMP ? ! ASA LIPITOR

## 2010-04-01 NOTE — Assessment & Plan Note (Signed)
Summary: F/U AFTER LABS / LFW   Vital Signs:  Patient profile:   70 year old female Height:      60 inches Weight:      140.50 pounds BMI:     27.54 Temp:     98.1 degrees F oral Pulse rate:   64 / minute Pulse rhythm:   regular BP sitting:   106 / 64  (left arm) Cuff size:   regular  Vitals Entered By: Lewanda Rife LPN (May 07, 2009 8:14 AM)  History of Present Illness: here for f/u of HTN and lipids and DM  DM in good control with AIC 6.0 -(down from 6.7) diet -- is staying away from sugar  opthy is up to date  lipids are up a bit with dec in zocor dose due to intolerance feels much better on lower dose of 20 now- muscle pain is better  LDL up from 86 to 104 diet - is getting better and better after she was sick   now can start walking - is looking foward to that  HTN is very well controlled with 106/64 today  R shoulder hurts -- cannot sleep a night or lift with it  2 mo  no trauma or injury  no swelling pain shoots down her arm  pain is on top of shoulder  hurts to sleep in certain positions  Allergies: 1)  ! * Tetanus 2)  ! Hydrocodone 3)  ! * Tessalon Pearles 4)  ! * Shrimp ? 5)  ! Asa 6)  Lipitor  Past History:  Past Medical History: Last updated: 06/02/2008 Allergic rhinitis Colonic polyps, hx of Coronary artery disease GERD Hyperlipidemia Hypertension Urinary incontinence- overactive bladder 5/09 lichen sclerosis (dx by gyn after abn pap) Cicatricial Pemphigoid, autoimmune d/o, affecting eyes, mouth, throat now (Dx [facility]) lichen sclerosis - vulvar    GYN--Dr Kincius Opthalmology--Dr. Dingledein urol- [facility] - Dr Trisha [facility])- for pemphigoid   Past Surgical History: Last updated: 08/28/2008 Hysterectomy Oophorectomy LAD angioplasty (1995) Colonoscopy- polyps (1995) Cardiolite (08/2001) Nasal biopsy EGD- HH, esoph stricture- dilated (05/2005) Colonoscopy- diverticulosis, polyp Cataract extraction 5/09 CT of chest-  granulomas in upper lobe of lung and liver  5/10 dexa normal 6/10 colonoscopy- polyp  Family History: Last updated: 01-06-09 Father: deceased- lung cancer Mother: CVA, alzheimer's Siblings: 1 brother with HTN, 1 sister son with esoph cancer-- passed away   Social History: Last updated: 10/08/2007 Marital Status: Married (husband is fighting cancer) Children: 2 Occupation: retired exercises on stationary bike/walks Never Smoked Alcohol use-no  Risk Factors: Smoking Status: never (05/31/2007)  Review of Systems General:  Denies fatigue, fever, loss of appetite, and malaise. Eyes:  Denies blurring and eye irritation. CV:  Denies chest pain or discomfort, palpitations, and shortness of breath with exertion. Resp:  Denies cough and wheezing. GI:  Denies change in bowel habits, indigestion, and nausea. GU:  Denies urinary frequency. MS:  Complains of joint pain; denies joint redness, joint swelling, cramps, and muscle weakness. Derm:  Denies lesion(s), poor wound healing, and rash. Neuro:  Denies numbness, tremors, and weakness. Endo:  Denies cold intolerance, excessive thirst, excessive urination, and heat intolerance. Heme:  Denies abnormal bruising and bleeding.  Physical Exam  General:  overweight but generally well appearing - moon facies  Head:  normocephalic, atraumatic, and no abnormalities observed.   Eyes:  vision grossly intact, pupils equal, pupils round, and pupils reactive to light.   Mouth:  pharynx pink and moist.   Neck:  supple with full rom and no masses or thyromegally, no JVD or carotid bruit  Chest Wall:  No deformities, masses, or tenderness noted. Lungs:  Normal respiratory effort, chest expands symmetrically. Lungs are clear to auscultation, no crackles or wheezes. Heart:  Normal rate and regular rhythm. S1 and S2 normal without gallop, murmur, click, rub or other extra sounds. Abdomen:  soft, non-tender, and normal bowel sounds.  no renal bruits    Msk:  R shoulder - pain to abd over 90 deg  pain on any int or ext rot acromion tenderness worse with ext of shoulder  pos hawkings and neer tests  no swelling or deformity or skin change  Pulses:  R and L carotid,radial,femoral,dorsalis pedis and posterior tibial pulses are full and equal bilaterally Extremities:  no cce  Neurologic:  strength normal in all extremities, sensation intact to light touch, and DTRs symmetrical and normal.   Skin:  Intact without suspicious lesions or rashes lesion on L cheek is healing Cervical Nodes:  No lymphadenopathy noted Psych:  normal affect, talkative and pleasant   Diabetes Management Exam:    Foot Exam (with socks and/or shoes not present):       Sensory-Pinprick/Light touch:          Left medial foot (L-4): normal          Left dorsal foot (L-5): normal          Left lateral foot (S-1): normal          Right medial foot (L-4): normal          Right dorsal foot (L-5): normal          Right lateral foot (S-1): normal       Sensory-Monofilament:          Left foot: normal          Right foot: normal       Inspection:          Left foot: normal          Right foot: normal       Nails:          Left foot: normal          Right foot: normal   Impression & Recommendations:  Problem # 1:  DIABETES MELLITUS, UNCONTROLLED (ICD-250.02) Assessment Improved  much imp control today with better diet  disc healthy diet (low simple sugar/ choose complex carbs/ low sat fat) diet and exercise in detail  no change in metformin - refilled  lab and f/u in 6 mo  opthy up to date Her updated medication list for this problem includes:    Lisinopril 40 Mg Tabs (Lisinopril) .Marland Kitchen... Take 1 tablet by mouth once a day    Metformin Hcl 500 Mg Tabs (Metformin hcl) .Marland Kitchen... 1/2 tab by mouth two times a day  Orders: Prescription Created Electronically [phone])  Problem # 2:  HYPERTENSION (ICD-401.9) Assessment: Unchanged  bp in excellent control with current  meds  no changes  inc exercise as tol lab and f/u in 6 mo  Her updated medication list for this problem includes:    Diltiazem Hcl Cr 120 Mg Xr12h-cap (Diltiazem hcl) .Marland Kitchen... 1 by mouth once daily in am    Lisinopril 40 Mg Tabs (Lisinopril) .Marland Kitchen... Take 1 tablet by mouth once a day  BP today: 106/64 Prior BP: 124/80 (03/31/2009)  Labs Reviewed: K+: 4.3 (05/04/2009) Creat: : 0.8 (05/04/2009)   Chol: 185 (05/04/2009)   HDL: 60.30 (  05/04/2009)   LDL: 104 (05/04/2009)   TG: 103.0 (05/04/2009)  Orders: Prescription Created Electronically [phone])  Problem # 3:  HYPERLIPIDEMIA (ICD-272.4) Assessment: Deteriorated  unfortunately cannot get to goal of LDL under 70 due to statin intol  will continue the 20 of zocor that she does tol  low sat fat diet disc in detail  lab and f/u in 6 mo  Her updated medication list for this problem includes:    Zocor 20 Mg Tabs (Simvastatin) .Marland Kitchen... 1 by mouth once daily  Labs Reviewed: SGOT: 18 (05/04/2009)   SGPT: 19 (05/04/2009)   HDL:60.30 (05/04/2009), 50.40 (02/04/2009)  LDL:104 (05/04/2009), 105 (12/23/2008)  Chol:185 (05/04/2009), 155 (02/04/2009)  Trig:103.0 (05/04/2009), 221.0 (02/04/2009)  Orders: Prescription Created Electronically [id number])  Problem # 4:  SHOULDER PAIN, RIGHT (ICD-719.41) Assessment: New  with pain on abduction and rotation- 2 mo  suspect rotator cuff pathology or tendoniits  already on prednisone  will ref to Dr Patsy Lager [facility] when able  Orders: Prescription Created Electronically [phone])  Complete Medication List: 1)  Omeprazole 20 Mg Tbec (Omeprazole) .... Take 1 tablet by mouth every morning 2)  Diltiazem Hcl Cr 120 Mg Xr12h-cap (Diltiazem hcl) .Marland Kitchen.. 1 by mouth once daily in am 3)  Lisinopril 40 Mg Tabs (Lisinopril) .... Take 1 tablet by mouth once a day 4)  Zocor 20 Mg Tabs (Simvastatin) .Marland Kitchen.. 1 by mouth once daily 5)  Prednisone 10 Mg Tabs (Prednisone) .Marland Kitchen.. 15 mg by mouth every other day, 30 mg by mouth every other  day, 25mg  every ohter day (alternating dose) 6)  Omnipred 1 % Susp (Prednisolone acetate) .... One drop in each eye two times a day 7)  Timolol Maleate 0.25 % Soln (Timolol maleate) .... One drop in each eye once daily 8)  Metformin Hcl 500 Mg Tabs (Metformin hcl) .... 1/2 tab by mouth two times a day 9)  Azathioprine 50 Mg Tabs (Azathioprine) .... Take one and a half  by mouth daily 10)  Glucometer  .... To check sugar once daily and as needed for diabetes 250.0 11)  Glucose Test Strips  .... To check sugar once daily and as needed for diabetes 250.0 12)  Ambien 10 Mg Tabs (Zolpidem tartrate) .... 1/2 to 1 by mouth at bedtime as needed insomnia 13)  Toviaz 8 Mg Xr24h-tab (Fesoterodine fumarate) .... Take one by mouth daily  Patient Instructions: 1)  please sched appt with Dr Patsy Lager for shoulder pain  2)  no change in medicines 3)  keep working on diet and exercise 4)  schedule fasting labs in 6 months lipid/ast/alt/renal / AIC / microalb/ tsh 401.1, 272, 250.0 and then follow up  5)  I sent your metformin px to [facility]  Prescriptions: METFORMIN HCL 500 MG TABS (METFORMIN HCL) 1/2 tab by mouth two times a day  #90 x 3   Entered and Authorized by:   Judith Part MD   Signed by:   Judith Part MD on 05/07/2009   Method used:   Electronically to        [facility]       [address]       [place], [place]  [address]       Ph: [phone]       Fax: [phone]   RxID:   [id number]   Current Allergies (reviewed today): ! * TETANUS ! HYDROCODONE ! * TESSALON PEARLES ! * SHRIMP ? !  ASA LIPITOR

## 2010-04-01 NOTE — Progress Notes (Signed)
Summary: change meds  Phone Note Call from Patient Call back at Home Phone [phone]   Caller: Patient Call For: dr Mishayla Sliwinski Summary of Call: pt states she was seen this am and given vesicare, ins wont cover that but they will ditropan, asks that that be [facility] Initial call taken by: Lowella Petties,  September 21, 2007 1:37 PM  Follow-up for Phone Call        ditropan is what I took her off of THIS MORNING at her appt because she said it was not working and wanted to change to vesicare (I also advised her it would be more expensive but she still wanted to change it) let me know what she wants to do Follow-up by: Judith Part MD,  September 21, 2007 2:04 PM  Additional Follow-up for Phone Call Additional follow up Details #1::        Pt does want to stick with ditropan, but shewants to know if the dose can be increased? she takes 5mg  two times a day.  can try inc to 10 mg two times a day- this is the absolute maximum dose-- tends to not be tolerated well (dry mouth, sedation, constipation )  since she already has the 5 mg-- have her try taking 2 two times a day for a couple of days - and update me with info--? does it help/ how are side eff? Additional Follow-up by: Liane Comber,  September 21, 2007 3:01 PM    Additional Follow-up for Phone Call Additional follow up Details #2::    Advised patient.  ......................................................Marland KitchenMarcelle Smiling Chavers September 21, 2007 5:30 PM

## 2010-04-01 NOTE — Assessment & Plan Note (Signed)
Summary: REFILL MEDICATIONS/CLE   Vital Signs:  Patient Profile:   70 Years Old Female Height:     60.25 inches (153.04 cm) Weight:      145 pounds BMI:     28.19 Temp:     97.4 degrees F oral Pulse rate:   84 / minute Pulse rhythm:   regular BP sitting:   144 / 70  (left arm) Cuff size:   regular  Vitals Entered By: Liane Comber (March 14, 2008 10:59 AM)                 Chief Complaint:  med refill.  History of Present Illness: has been doing ok   mouth is still a problem-- oral pemphagoid  is seeing doctor in [place]- and is being treated  still on prednisone - will taper soon-- very slowly  also on methotrexate   saw Dr Achilles Dunk for incontinence -- imipramine for overactive bladder  this is helping   is eating healthy and getting exercise   needs refil of meds   bp is a little higher today  son with esoph ca is doing better -- she is relieved by that , hoping for a less stressful year     Serial Vital Signs/Assessments:  Time      Position  BP       Pulse  Resp  Temp     By                     125/60                         Judith Part MD    Current Allergies (reviewed today): ! * TETANUS ! HYDROCODONE ! * TESSALON PEARLES ! * SHRIMP ? LIPITOR  Past Medical History:    Reviewed history from 11/14/2007 and no changes required:       Allergic rhinitis       Colonic polyps, hx of       Coronary artery disease       GERD       Hyperlipidemia       Hypertension       Urinary incontinence- overactive bladder       5/09 lichen sclerosis (dx by gyn after abn pap)       Cicatricial Pemphigoid, autoimmune d/o, affecting eyes, mouth, throat now (Dx [facility])                     GYN--Dr Kincius       Opthalmology--Dr. Dingledein       urol- [facility]       - Dr Trisha [facility])- for pemphigoid   Past Surgical History:    Reviewed history from 07/11/2007 and no changes required:       Hysterectomy       Oophorectomy       LAD angioplasty  (1995)       Colonoscopy- polyps (1995)       Cardiolite (08/2001)       Nasal biopsy       EGD- HH, esoph stricture- dilated (05/2005)       Colonoscopy- diverticulosis, polyp       Cataract extraction       5/09 CT of chest- granulomas in upper lobe of lung and liver    Family History:    Reviewed history from 03/14/2007 and no changes required:  Father: deceased- lung cancer       Mother: CVA, alzheimer's       Siblings: 1 brother with HTN, 1 sister       son with esoph cancer  Social History:    Reviewed history from 10/08/2007 and no changes required:       Marital Status: Married (husband is fighting cancer)       Children: 2       Occupation: retired       exercises on stationary bike/walks       Never Smoked       Alcohol use-no    Review of Systems  General      Denies fatigue, loss of appetite, malaise, and weight loss.  Eyes      Denies blurring and eye pain.  CV      Denies chest pain or discomfort, palpitations, and shortness of breath with exertion.  Resp      Denies cough and wheezing.  GI      Denies abdominal pain and change in bowel habits.  GU      Complains of incontinence.      Denies dysuria and urinary hesitancy.  Derm      Denies rash.  Neuro      Denies numbness and tingling.  Psych      mood is good   Endo      Denies excessive thirst and excessive urination.   Physical Exam  General:     overweight but generally well appearing  Head:     normocephalic, atraumatic, and no abnormalities observed.   Eyes:     vision grossly intact, pupils equal, pupils round, and pupils reactive to light.   Mouth:     pharynx pink and moist.   mouth with some ulceration and hyperemia of gums Neck:     supple with full rom and no masses or thyromegally, no JVD or carotid bruit  Chest Wall:     No deformities, masses, or tenderness noted. Lungs:     Normal respiratory effort, chest expands symmetrically. Lungs are clear to  auscultation, no crackles or wheezes. Heart:     Normal rate and regular rhythm. S1 and S2 normal without gallop, murmur, click, rub or other extra sounds. Abdomen:     soft and non-tender.  no renal bruits Msk:     No deformity or scoliosis noted of thoracic or lumbar spine.   Pulses:     R and L carotid,radial,femoral,dorsalis pedis and posterior tibial pulses are full and equal bilaterally Extremities:     No clubbing, cyanosis, edema, or deformity noted with normal full range of motion of all joints.   Neurologic:     sensation intact to light touch, gait normal, and DTRs symmetrical and normal.   Skin:     Intact without suspicious lesions or rashes Cervical Nodes:     No lymphadenopathy noted Psych:     normal affect, talkative and pleasant     Impression & Recommendations:  Problem # 1:  HYPERTENSION (ICD-401.9) Assessment: Unchanged bp better on second check today - will keep working on healthy diet and exercise no change in meds will do fasting labs at f/u in april Her updated medication list for this problem includes:    Diltiazem Hcl Coated Beads 300 Mg Cp24 (Diltiazem hcl coated beads) .Marland Kitchen... Take one by mouth once a day    Lisinopril 40 Mg Tabs (Lisinopril) .Marland Kitchen... Take 1 tablet by mouth once a  day  BP today: 144/70- re check 125/60 Prior BP: 120/80 (11/14/2007)  Labs Reviewed: Creat: 0.7 (05/31/2007) Chol: 161 (05/31/2007)   HDL: 53.2 (05/31/2007)   LDL: 90 (05/31/2007)   TG: 90 (05/31/2007)   Problem # 2:  HYPERLIPIDEMIA (ICD-272.4) Assessment: Unchanged has been very well controlled with zocor and diet rev sat fats in diet- overall very compliant  will plan labs at f/u in april  med renewed  Her updated medication list for this problem includes:    Zocor 20 Mg Tabs (Simvastatin) .Marland Kitchen... Take one by mouth once a day  Labs Reviewed: Chol: 161 (05/31/2007)   HDL: 53.2 (05/31/2007)   LDL: 90 (05/31/2007)   TG: 90 (05/31/2007) SGOT: 21 (05/31/2007)   SGPT: 21  (05/31/2007)   Problem # 3:  URINARY INCONTINENCE (ICD-788.30) Assessment: Improved doing fairly well on imipramine from Dr Achilles Dunk  may consider bladder tack in future   Problem # 4:  PEMPHIGOID (ICD-694.5) Assessment: Improved imp on prednisone and mtx from specialist in [place] need to watch closely for DM from prednisone  pt does get frequent labs - states sugar has been ok  Complete Medication List: 1)  Omeprazole 20 Mg Tbec (Omeprazole) .Marland Kitchen.. 1 by mouth each am 2)  Diltiazem Hcl Coated Beads 300 Mg Cp24 (Diltiazem hcl coated beads) .... Take one by mouth once a day 3)  Lisinopril 40 Mg Tabs (Lisinopril) .... Take 1 tablet by mouth once a day 4)  Zocor 20 Mg Tabs (Simvastatin) .... Take one by mouth once a day 5)  Folic Acid 1 Mg Tabs (Folic acid) .... Take 1 tablet by mouth once a day except on days taking methotrexate 6)  Methotrexate 2.5 Mg Tabs (Methotrexate sodium) .... Three tabs by mouth every week 7)  Prednisone 10 Mg Tabs (Prednisone) .... Thre tabs by mouth once daily 8)  Imipramine Hcl 25 Mg Tabs (Imipramine hcl) .... Take 1 tablet by mouth once a day every evening   Patient Instructions: 1)  keep working on healthy diet and exercise  2)  no change in medicines 3)  we will check labs at your follow up    Prescriptions: ZOCOR 20 MG  TABS (SIMVASTATIN) Take one by mouth once a day  #90 x 3   Entered and Authorized by:   Judith Part MD   Signed by:   Judith Part MD on 03/14/2008   Method used:   Print then Give to Patient   RxID:   [id number] LISINOPRIL 40 MG TABS (LISINOPRIL) Take 1 tablet by mouth once a day  #90 x 3   Entered and Authorized by:   Judith Part MD   Signed by:   Judith Part MD on 03/14/2008   Method used:   Print then Give to Patient   RxID:   [id number] DILTIAZEM HCL COATED BEADS 300 MG CP24 (DILTIAZEM HCL COATED BEADS) Take one by mouth once a day  #90 x 3   Entered and Authorized by:   Judith Part MD   Signed  by:   Judith Part MD on 03/14/2008   Method used:   Print then Give to Patient   RxID:   [id number] OMEPRAZOLE 20 MG  TBEC (OMEPRAZOLE) 1 by mouth each am  #90 x 3   Entered and Authorized by:   Judith Part MD   Signed by:   Judith Part MD on 03/14/2008   Method used:   Print then Give to Patient  RxID:   [id number]

## 2010-04-01 NOTE — Consult Note (Signed)
Summary: [facility]/Dr. Achilles Dunk  [facility]/Dr. Achilles Dunk   Imported By: Eleonore Chiquito 01/09/2008 11:21:31  _____________________________________________________________________  External Attachment:    Type:   Image     Comment:   External Document

## 2010-04-01 NOTE — Letter (Signed)
Summary: [facility]  [facility]   Imported By: Maryln Gottron 03/03/2010 09:44:23  _____________________________________________________________________  External Attachment:    Type:   Image     Comment:   External Document

## 2010-04-01 NOTE — Letter (Signed)
Summary: [facility]/Dr. Achilles Dunk  [facility]/Dr. Achilles Dunk   Imported By: Eleonore Chiquito 06/20/2008 16:42:15  _____________________________________________________________________  External Attachment:    Type:   Image     Comment:   External Document

## 2010-04-01 NOTE — Progress Notes (Signed)
Summary: ? another cough medication  Phone Note Call from Patient Call back at Home Phone [phone]   Caller: Patient Call For: Dr. Milinda Antis Summary of Call: Patient saw you on the 23rd and you gave her a Rx. for Tussionex.  She did not pick it up from the pharmacy because it was going to cost $165 for 8 oz.  She was hoping she could get better on her own but she is still coughing a lot and very congested.  Can you phone in anything else that might be cheaper?  Uses [facility] in [place].  Please call patient when prescription is phoned in. Initial call taken by: Delilah Shan,  Jul 02, 2007 10:03 AM  Follow-up for Phone Call        can try guifenesin with codine- caution with narcotic update if she knows she is allergic to codiene px written on EMR for call in  Follow-up by: Judith Part MD,  Jul 02, 2007 11:50 AM  Additional Follow-up for Phone Call Additional follow up Details #1::        advised pt, [facility] [place] Additional Follow-up by: Lowella Petties,  Jul 02, 2007 12:54 PM    New/Updated Medications: GUAIFENESIN-CODEINE 100-10 MG/5ML  LIQD (GUAIFENESIN-CODEINE) 1-2 tsp by mouth q 6 hours as needed severe cough   Prescriptions: GUAIFENESIN-CODEINE 100-10 MG/5ML  LIQD (GUAIFENESIN-CODEINE) 1-2 tsp by mouth q 6 hours as needed severe cough  #120cc x 0   Entered and Authorized by:   Judith Part MD   Signed by:   Judith Part MD on 07/02/2007   Method used:   Telephoned to ...         RxID:   [id number]

## 2010-04-01 NOTE — Letter (Signed)
Summary: ELectrophysiology/Ablation Procedure Instructions  [facility], Main Office  [address]   [place], [place] [address]   Phone: [phone]  Fax: [phone]     Electrophysiology/Ablation Procedure Instructions    You are scheduled for a(n) SVT ablation on 10/07/09 at 8:30am with Dr. Ladona Ridgel.  1.  Please come to the Short Stay Center at [facility] at 6:30am on the day of your procedure.  2.  Come prepared to stay overnight.   Please bring your insurance cards and a list of your medications.  3.  Come to the [place] office on 10/01/09 for lab work. .  You do not have to be fasting.  4.  Do not have anything to eat or drink after midnight the night before your procedure.  5.  Do NOT take these medications for 2 days before your procedure unless otherwise instructed: Cardizem and Digoxin.  All of your remaining medications may be taken with a small amount of water.  6.  Educational material received: Ablation   * Occasionally, EP studies and ablations can become lengthy.  Please make your family aware of this before your procedure starts.  Average time ranges from 2-8 hours for EP studies/ablations.  Your physician will locate your family after the procedure with the results.  * If you have any questions after you get home, please call the office at [phone].  Anselm Pancoast  Appended Document: ELectrophysiology/Ablation Procedure Instructions hold Metformin also am of procedure

## 2010-04-01 NOTE — Letter (Signed)
Summary: [facility] MD/[facility]   Imported By: Lester Morovis 01/30/2010 10:42:46  _____________________________________________________________________  External Attachment:    Type:   Image     Comment:   External Document

## 2010-04-01 NOTE — Assessment & Plan Note (Signed)
Summary: NOT FEELING WELL - NOT SURE IF ITS FROM DIABETES / LFW   Vital Signs:  Patient profile:   70 year old female Weight:      145 pounds BMI:     28.42 Temp:     98.1 degrees F rectal Pulse rate:   72 / minute Pulse rhythm:   regular BP sitting:   120 / 60  (left arm) Cuff size:   regular  Vitals Entered By: Lowella Petties CMA (January 17, 2009 8:43 AM) CC: follow-up visit for diabetes   History of Present Illness: here for f/u of DM  is doing pretty well overall  did not f/u for DM in summer -- since her son passed away- is difficult for her  overall is doing ok with grief   last AIC was 7.4 in the spring is on metformin-- no trouble with that at all  does not check home sugars -- is interested in machine of her own  on prednisone for pemphigoid -- had to go back up to 30 mg daily  this is making her skin very thin -- and tears easily   does need flu shot and pneumovax   on ace not on asa-- stopped it due to easy bleeding  on statin  bp is good and wt is stable  diet - doing pretty good with that   opthy up to date from march  Allergies: 1)  ! * Tetanus 2)  ! Hydrocodone 3)  ! * Tessalon Pearles 4)  ! * Shrimp ? 5)  ! Asa 6)  Lipitor  Past History:  Past Medical History: Last updated: 06/02/2008 Allergic rhinitis Colonic polyps, hx of Coronary artery disease GERD Hyperlipidemia Hypertension Urinary incontinence- overactive bladder 5/09 lichen sclerosis (dx by gyn after abn pap) Cicatricial Pemphigoid, autoimmune d/o, affecting eyes, mouth, throat now (Dx [facility]) lichen sclerosis - vulvar    GYN--Dr Kincius Opthalmology--Dr. Dingledein urol- [facility] - Dr Trisha [facility])- for pemphigoid   Past Surgical History: Last updated: 08/28/2008 Hysterectomy Oophorectomy LAD angioplasty (1995) Colonoscopy- polyps (1995) Cardiolite (08/2001) Nasal biopsy EGD- HH, esoph stricture- dilated (05/2005) Colonoscopy- diverticulosis, polyp Cataract  extraction 5/09 CT of chest- granulomas in upper lobe of lung and liver  5/10 dexa normal 6/10 colonoscopy- polyp  Family History: Last updated: 01/17/2009 Father: deceased- lung cancer Mother: CVA, alzheimer's Siblings: 1 brother with HTN, 1 sister son with esoph cancer-- passed away   Social History: Last updated: 10/08/2007 Marital Status: Married (husband is fighting cancer) Children: 2 Occupation: retired exercises on stationary bike/walks Never Smoked Alcohol use-no  Risk Factors: Smoking Status: never (05/31/2007)  Family History: Father: deceased- lung cancer Mother: CVA, alzheimer's Siblings: 1 brother with HTN, 1 sister son with esoph cancer-- passed away   Review of Systems General:  Denies fatigue, fever, loss of appetite, and malaise. Eyes:  Denies blurring and eye irritation. CV:  Denies chest pain or discomfort, palpitations, shortness of breath with exertion, and swelling of feet. Resp:  Denies cough and wheezing. GI:  Denies abdominal pain, bloody stools, change in bowel habits, and indigestion. GU:  Denies urinary frequency. MS:  Denies joint pain, joint redness, and joint swelling. Derm:  Complains of poor wound healing; denies lesion(s) and rash. Neuro:  Denies numbness and tingling. Psych:  mood is ok . Endo:  Denies excessive thirst and excessive urination. Heme:  Denies abnormal bruising and bleeding.  Physical Exam  General:  moon facies, NAD Head:  normocephalic, atraumatic, and no abnormalities observed.  Eyes:  vision grossly intact, pupils equal, pupils round, and pupils reactive to light.  no conjunctival pallor, injection or icterus  Neck:  supple with full rom and no masses or thyromegally, no JVD or carotid bruit  Lungs:  Normal respiratory effort, chest expands symmetrically. Lungs are clear to auscultation, no crackles or wheezes. Heart:  Normal rate and regular rhythm. S1 and S2 normal without gallop, murmur, click, rub or other  extra sounds. Msk:  No deformity or scoliosis noted of thoracic or lumbar spine.   no acute joint changes  Pulses:  R and L carotid,radial,femoral,dorsalis pedis and posterior tibial pulses are full and equal bilaterally Extremities:  No clubbing, cyanosis, edema, or deformity noted with normal full range of motion of all joints.   Neurologic:  sensation intact to light touch, gait normal, and DTRs symmetrical and normal.  no tremor  Skin:  Intact without suspicious lesions or rashes many bruises/ few abrasions bilat forearms  Cervical Nodes:  No lymphadenopathy noted Psych:  normal affect, talkative and pleasant   Diabetes Management Exam:    Foot Exam (with socks and/or shoes not present):       Sensory-Pinprick/Light touch:          Left medial foot (L-4): normal          Left dorsal foot (L-5): normal          Left lateral foot (S-1): normal          Right medial foot (L-4): normal          Right dorsal foot (L-5): normal          Right lateral foot (S-1): normal       Sensory-Monofilament:          Left foot: normal          Right foot: normal       Inspection:          Left foot: normal          Right foot: normal       Nails:          Left foot: normal          Right foot: normal   Impression & Recommendations:  Problem # 1:  DIABETES MELLITUS, UNCONTROLLED (ICD-250.02) Assessment Unchanged  overdue for f/u per pt - good diet  will start monitoring glucose once daily -- disc goals for control  on ace asa contraindicated- pt non tolerant opthy utd from march  lab today and adv  f/u 3 mo with glucose record  Her updated medication list for this problem includes:    Lisinopril 40 Mg Tabs (Lisinopril) .Marland Kitchen... Take 1 tablet by mouth once a day    Metformin Hcl 500 Mg Tabs (Metformin hcl) .Marland Kitchen... 1/2 tab by mouth two times a day  Orders: Venipuncture (16109) TLB-A1C / Hgb A1C (Glycohemoglobin) (83036-A1C)  Labs Reviewed: Creat: 0.8 (06/02/2008)     Last Eye Exam:  normal (05/11/2008) Reviewed HgBA1c results: 7.4 (04/28/2008)  Problem # 2:  HYPERTENSION (ICD-401.9) Assessment: Unchanged  bp well controlled with current med disc opt diet and exercise  Her updated medication list for this problem includes:    Diltiazem Hcl Coated Beads 300 Mg Cp24 (Diltiazem hcl coated beads) .Marland Kitchen... Take one by mouth once a day    Lisinopril 40 Mg Tabs (Lisinopril) .Marland Kitchen... Take 1 tablet by mouth once a day  Orders: Venipuncture (60454) TLB-Lipid Panel (80061-LIPID) TLB-Renal Function Panel (80069-RENAL) TLB-ALT (SGPT) (84460-ALT) TLB-AST (  SGOT) (84450-SGOT)  BP today: 120/60 Prior BP: 120/70 (06/02/2008)  Labs Reviewed: K+: 4.6 (06/02/2008) Creat: : 0.8 (06/02/2008)   Chol: 172 (06/02/2008)   HDL: 48.70 (06/02/2008)   LDL: 95 (06/02/2008)   TG: 144.0 (06/02/2008)  Problem # 3:  HYPERLIPIDEMIA (ICD-272.4) Assessment: Unchanged  lipid fairly controlled with LDL under 100 ( would be better to get below 70) disc low sat fat diet  lab today and update  Her updated medication list for this problem includes:    Zocor 20 Mg Tabs (Simvastatin) .Marland Kitchen... Take one by mouth once a day  Orders: Venipuncture (16109) TLB-Lipid Panel (80061-LIPID) TLB-Renal Function Panel (80069-RENAL) TLB-ALT (SGPT) (84460-ALT) TLB-AST (SGOT) (84450-SGOT)  Labs Reviewed: SGOT: 20 (06/02/2008)   SGPT: 30 (06/02/2008)   HDL:48.70 (06/02/2008), 53.2 (05/31/2007)  LDL:95 (06/02/2008), 90 (05/31/2007)  Chol:172 (06/02/2008), 161 (05/31/2007)  Trig:144.0 (06/02/2008), 90 (05/31/2007)  Problem # 4:  GRIEF REACTION (ICD-309.0) Assessment: New is overall doing well with good fam support- after loosing son to esoph ca  Complete Medication List: 1)  Omeprazole 20 Mg Tbec (Omeprazole) .... Take 1 tablet by mouth every morning 2)  Diltiazem Hcl Coated Beads 300 Mg Cp24 (Diltiazem hcl coated beads) .... Take one by mouth once a day 3)  Lisinopril 40 Mg Tabs (Lisinopril) .... Take 1 tablet by  mouth once a day 4)  Zocor 20 Mg Tabs (Simvastatin) .... Take one by mouth once a day 5)  Folic Acid 1 Mg Tabs (Folic acid) .... Take 1 tablet by mouth two times a day 6)  Prednisone 10 Mg Tabs (Prednisone) .... Take 30 mg's daily 7)  Imipramine Hcl 25 Mg Tabs (Imipramine hcl) .... Take 2 tabletsby mouth once a day every evening 8)  Omnipred 1 % Susp (Prednisolone acetate) .... One drop in each eye two times a day 9)  Timolol Maleate 0.25 % Soln (Timolol maleate) .... One drop in each eye once daily 10)  Metformin Hcl 500 Mg Tabs (Metformin hcl) .... 1/2 tab by mouth two times a day 11)  Azathioprine 50 Mg Tabs (Azathioprine) .... Take one by mouth daily 12)  Glucometer  .... To check sugar once daily and as needed for diabetes 250.0 13)  Glucose Test Strips  .... To check sugar once daily and as needed for diabetes 250.0  Other Orders: Influenza Vaccine MCR (60454) Pneumococcal Vaccine (09811) Admin 1st Vaccine (91478) Admin 1st Vaccine [facility]) [phone])  Patient Instructions: 1)  flu shot today  2)  pneumonia vaccine today  3)  try checking sugar - some ams (before breakfast) and some pms (2 hours after a meal )  4)  goal is sugar of 120 or below in am , and 140 or below 2 hours after a meal  5)  labs today  6)  no change in medicine  7)  follow up with me in about 3 months  Prescriptions: GLUCOSE TEST STRIPS to check sugar once daily and as needed for diabetes 250.0  #100 x 3   Entered and Authorized by:   Judith Part MD   Signed by:   Judith Part MD on 12/23/2008   Method used:   Print then Give to Patient   RxID:   [id number] GLUCOMETER to check sugar once daily and as needed for diabetes 250.0  #1 x 0   Entered and Authorized by:   Judith Part MD   Signed by:   Judith Part MD on 12/23/2008   Method used:  Print then Give to Patient   RxID:   [id number]   Prior Medications (reviewed today): OMEPRAZOLE 20 MG  TBEC (OMEPRAZOLE) Take 1 tablet by  mouth every morning DILTIAZEM HCL COATED BEADS 300 MG CP24 (DILTIAZEM HCL COATED BEADS) Take one by mouth once a day LISINOPRIL 40 MG TABS (LISINOPRIL) Take 1 tablet by mouth once a day ZOCOR 20 MG  TABS (SIMVASTATIN) Take one by mouth once a day FOLIC ACID 1 MG TABS (FOLIC ACID) Take 1 tablet by mouth two times a day PREDNISONE 10 MG TABS (PREDNISONE) take 30 mg's daily IMIPRAMINE HCL 25 MG TABS (IMIPRAMINE HCL) Take 2 tabletsby mouth once a day every evening OMNIPRED 1 % SUSP (PREDNISOLONE ACETATE) One drop in each eye two times a day TIMOLOL MALEATE 0.25 % SOLN (TIMOLOL MALEATE) One drop in each eye once daily METFORMIN HCL 500 MG TABS (METFORMIN HCL) 1/2 tab by mouth two times a day AZATHIOPRINE 50 MG TABS (AZATHIOPRINE) take one by mouth daily Current Allergies (reviewed today): ! * TETANUS ! HYDROCODONE ! * TESSALON PEARLES ! * SHRIMP ? ! ASA LIPITOR    Pneumovax Vaccine    Vaccine Type: Pneumovax    Site: right deltoid    Mfr: Merck    Dose: 0.5 ml    Route: IM    Given by: Lowella Petties CMA    Exp. Date: 02/12/2010    Lot #: [id number]    VIS given: 09/26/95 version given December 23, 2008.  Influenza Vaccine    Vaccine Type: Fluvax MCR    Site: left deltoid    Mfr: GlaxoSmithKline    Dose: 0.5 ml    Route: IM    Given by: Lowella Petties CMA    Exp. Date: 08/27/2009    Lot #: [id number]    VIS given: 09/21/06 version given December 23, 2008.  Flu Vaccine Consent Questions    Do you have a history of severe allergic reactions to this vaccine? no    Any prior history of allergic reactions to egg and/or gelatin? no    Do you have a sensitivity to the preservative Thimersol? no    Do you have a past history of Guillan-Barre Syndrome? no    Do you currently have an acute febrile illness? no    Have you ever had a severe reaction to latex? no    Vaccine information given and explained to patient? yes    Are you currently pregnant? no

## 2010-04-01 NOTE — Assessment & Plan Note (Signed)
Summary: POST HOSP--[place]... CYD   Vital Signs:  Patient profile:   70 year old female Height:      60 inches Weight:      139 pounds BMI:     27.24 Temp:     98.2 degrees F oral Pulse rate:   60 / minute Pulse rhythm:   regular BP sitting:   124 / 80  (right arm) Cuff size:   regular  Vitals Entered By: Liane Comber CMA Duncan Dull) (March 31, 2009 8:14 AM) CC: hosp f/u ([facility])   History of Present Illness: here for f/u of her hosp for septic shock with infx colitis  had small bowel obst with suspected infection  was supported and tx with cipro and flagyl-- and d/c much imp  wt is down 7 lb since last visit   is feeling much much better  finished abx and trying to get back to normal   reviewed d/c summary with pt today    has hx of some tics on colonosc  had been eating almonds   had skin tear when she got home on R arm- getting better with neosporin is a little sore/ did not bleed much no redness or swelling now no pus or other drainage is covering lightly when she goes out  Allergies: 1)  ! * Tetanus 2)  ! Hydrocodone 3)  ! * Tessalon Pearles 4)  ! * Shrimp ? 5)  ! Asa 6)  Lipitor  Comments:  Nurse/Medical Assistant: The patient's medications and allergies were reviewed with the patient and were updated in the Medication and Allergy Lists. Natasha Chavers CMA Duncan Dull) (March 31, 2009 8:23 AM)  Past History:  Past Medical History: Last updated: 06/02/2008 Allergic rhinitis Colonic polyps, hx of Coronary artery disease GERD Hyperlipidemia Hypertension Urinary incontinence- overactive bladder 5/09 lichen sclerosis (dx by gyn after abn pap) Cicatricial Pemphigoid, autoimmune d/o, affecting eyes, mouth, throat now (Dx [facility]) lichen sclerosis - vulvar    GYN--Dr Kincius Opthalmology--Dr. Dingledein urol- [place] - Dr Trisha [facility])- for pemphigoid   Past Surgical History: Last updated: 08/28/2008 Hysterectomy Oophorectomy LAD  angioplasty (1995) Colonoscopy- polyps (1995) Cardiolite (08/2001) Nasal biopsy EGD- HH, esoph stricture- dilated (05/2005) Colonoscopy- diverticulosis, polyp Cataract extraction 5/09 CT of chest- granulomas in upper lobe of lung and liver  5/10 dexa normal 6/10 colonoscopy- polyp  Family History: Last updated: 2008-12-26 Father: deceased- lung cancer Mother: CVA, alzheimer's Siblings: 1 brother with HTN, 1 sister son with esoph cancer-- passed away   Social History: Last updated: 10/08/2007 Marital Status: Married (husband is fighting cancer) Children: 2 Occupation: retired exercises on stationary bike/walks Never Smoked Alcohol use-no  Risk Factors: Smoking Status: never (05/31/2007)  Review of Systems General:  Complains of fatigue; denies chills, fever, loss of appetite, and malaise. Eyes:  Denies blurring. CV:  Denies chest pain or discomfort, lightheadness, and palpitations. Resp:  Denies cough and shortness of breath. GI:  Denies abdominal pain, bloody stools, change in bowel habits, gas, indigestion, nausea, and vomiting. GU:  Denies dysuria. Derm:  Denies itching, lesion(s), poor wound healing, and rash. Neuro:  Denies numbness and tingling. Heme:  Denies abnormal bruising and bleeding.  Physical Exam  General:  overweight but generally well appearing - moon facies  Head:  normocephalic, atraumatic, and no abnormalities observed.   Eyes:  vision grossly intact, pupils equal, pupils round, and pupils reactive to light.  no conjunctival pallor, injection or icterus  Mouth:  pharynx pink and moist.  Neck:  supple with full rom and no masses or thyromegally, no JVD or carotid bruit  Lungs:  Normal respiratory effort, chest expands symmetrically. Lungs are clear to auscultation, no crackles or wheezes. Heart:  Normal rate and regular rhythm. S1 and S2 normal without gallop, murmur, click, rub or other extra sounds. Abdomen:  Bowel sounds positive,abdomen soft  and non-tender without masses, organomegaly or hernias noted. no renal bruits  Msk:  No deformity or scoliosis noted of thoracic or lumbar spine.  see skin exam for finger Neurologic:  strength normal in all extremities and sensation intact to light touch.   Skin:  no pallor or jaundice  healing abrasion on R forarm  Cervical Nodes:  No lymphadenopathy noted Psych:  normal affect, talkative and pleasant    Impression & Recommendations:  Problem # 1:  INFECTIOUS COLITIS ENTERITIS AND GASTROENTERITIS (ICD-009.0) Assessment New still unsure of origin- but pt does have diverticulosis adv to avoid nuts and seeds update if any symptoms return  get back to full activity slowly done with abx  Problem # 2:  ABRASION, ARM (ICD-919.0) Assessment: New R arm skin tear  healing well and non infected adv to keep clean and use triple abx oint pt advised to update me if symptoms worsen or do not improve   Complete Medication List: 1)  Omeprazole 20 Mg Tbec (Omeprazole) .... Take 1 tablet by mouth every morning 2)  Diltiazem Hcl Cr 120 Mg Xr12h-cap (Diltiazem hcl) .Marland Kitchen.. 1 by mouth once daily in am 3)  Lisinopril 40 Mg Tabs (Lisinopril) .... Take 1 tablet by mouth once a day 4)  Zocor 20 Mg Tabs (Simvastatin) .Marland Kitchen.. 1 by mouth once daily 5)  Prednisone 10 Mg Tabs (Prednisone) .Marland Kitchen.. 15 mg by mouth every other day, 30 mg by mouth every other day (alternating dose) 6)  Omnipred 1 % Susp (Prednisolone acetate) .... One drop in each eye two times a day 7)  Timolol Maleate 0.25 % Soln (Timolol maleate) .... One drop in each eye once daily 8)  Metformin Hcl 500 Mg Tabs (Metformin hcl) .... 1/2 tab by mouth two times a day 9)  Azathioprine 50 Mg Tabs (Azathioprine) .... Take one and a half  by mouth daily 10)  Glucometer  .... To check sugar once daily and as needed for diabetes 250.0 11)  Glucose Test Strips  .... To check sugar once daily and as needed for diabetes 250.0 12)  Ambien 10 Mg Tabs (Zolpidem  tartrate) .... 1/2 to 1 by mouth at bedtime as needed insomnia 13)  Toviaz 8 Mg Xr24h-tab (Fesoterodine fumarate) .... Take one by mouth daily  Patient Instructions: 1)  get triple antibiotic ointment for your arm  2)  let me know if no further improvement 3)  avoid nuts and seeds in general  4)  get good fiber and water intake  5)  update me if any abdominal symptoms return   Prevention & Chronic Care Immunizations   Influenza vaccine: Fluvax MCR  (12/23/2008)    Tetanus booster: 06/03/2008: Td    Pneumococcal vaccine: Pneumovax  (12/23/2008)    H. zoster vaccine: Not documented  Colorectal Screening   Hemoccult: Negative  (06/07/2006)    Colonoscopy: Adenomatous Polyp  (08/19/2008)   Colonoscopy due: 06/2008  Other Screening   Pap smear: abnormal  (05/31/2007)    Mammogram: Negative  (07/03/2008)   Mammogram action/deferral: Screening mammogram in 1 year.     (07/03/2008)   Mammogram due: 06/2008    DXA  bone density scan: normal  (06/30/2008)   Smoking status: never  (05/31/2007)  Diabetes Mellitus   HgbA1C: 6.7  (12/23/2008)    Eye exam: normal  (05/11/2008)   Eye exam due: 04/2009    Foot exam: yes  (12/23/2008)   High risk foot: Not documented   Foot care education: Not documented    Urine microalbumin/creatinine ratio: 2.5  (04/28/2008)  Lipids   Total Cholesterol: 155  (02/04/2009)   LDL: 105  (12/23/2008)   LDL Direct: 86.7  (02/04/2009)   HDL: 50.40  (02/04/2009)   Triglycerides: 221.0  (02/04/2009)    SGOT (AST): 20  (02/04/2009)   SGPT (ALT): 23  (02/04/2009)   Alkaline phosphatase: 57  (06/02/2008)   Total bilirubin: 0.8  (06/02/2008)  Hypertension   Last Blood Pressure: 124 / 80  (03/31/2009)   Serum creatinine: 0.8  (12/23/2008)   Serum potassium 4.5  (12/23/2008)  Self-Management Support :    Diabetes self-management support: Not documented    Hypertension self-management support: Not documented    Lipid self-management support:  Not documented

## 2010-04-01 NOTE — Assessment & Plan Note (Signed)
Summary: 4:15 for recheck per md/dlo   Vital Signs:  Patient profile:   70 year old female Weight:      146 pounds Temp:     97.8 degrees F oral Pulse rate:   88 / minute Pulse rhythm:   regular BP sitting:   110 / 62  (left arm) Cuff size:   regular  Vitals Entered By: Lowella Petties CMA (March 06, 2009 3:49 PM) CC: Recheck finger   History of Present Illness: on keflex for cellulitis of finger   finger is doing better overall - occ sore but not as much  has been taking it easy with it   avoids poking that finger  no fever- feels ok   ins no longer pays for diltiazem 300- needs to switch to a different formulation   Allergies: 1)  ! * Tetanus 2)  ! Hydrocodone 3)  ! * Tessalon Pearles 4)  ! * Shrimp ? 5)  ! Asa 6)  Lipitor  Past History:  Past Medical History: Last updated: 06/02/2008 Allergic rhinitis Colonic polyps, hx of Coronary artery disease GERD Hyperlipidemia Hypertension Urinary incontinence- overactive bladder 5/09 lichen sclerosis (dx by gyn after abn pap) Cicatricial Pemphigoid, autoimmune d/o, affecting eyes, mouth, throat now (Dx [facility]) lichen sclerosis - vulvar    GYN--Dr Kincius Opthalmology--Dr. Dingledein urol- [place] - Dr Trisha [facility])- for pemphigoid   Past Surgical History: Last updated: 08/28/2008 Hysterectomy Oophorectomy LAD angioplasty (1995) Colonoscopy- polyps (1995) Cardiolite (08/2001) Nasal biopsy EGD- HH, esoph stricture- dilated (05/2005) Colonoscopy- diverticulosis, polyp Cataract extraction 5/09 CT of chest- granulomas in upper lobe of lung and liver  5/10 dexa normal 6/10 colonoscopy- polyp  Family History: Last updated: 2009/01/14 Father: deceased- lung cancer Mother: CVA, alzheimer's Siblings: 1 brother with HTN, 1 sister son with esoph cancer-- passed away   Social History: Last updated: 10/08/2007 Marital Status: Married (husband is fighting cancer) Children: 2 Occupation:  retired exercises on stationary bike/walks Never Smoked Alcohol use-no  Risk Factors: Smoking Status: never (05/31/2007)  Review of Systems General:  Denies chills, fatigue, fever, loss of appetite, and malaise. CV:  Denies chest pain or discomfort, palpitations, shortness of breath with exertion, and swelling of feet. Resp:  Denies cough, shortness of breath, and wheezing. GI:  Denies abdominal pain. MS:  Denies joint pain. Derm:  Denies itching and poor wound healing. Neuro:  Denies numbness and tingling. Heme:  Denies abnormal bruising and bleeding.  Physical Exam  Skin:  L index finger- less redness and swelling/ no tenderness and scab is still present nl rom/ sens and perfusion   Impression & Recommendations:  Problem # 1:  CELLULITIS, FINGER (ICD-681.00) Assessment Improved improved/ almost resolved on keflex will finish course and update if not back to normal next week  adv to keep it clean and dry and update if symptoms worsen The following medications were removed from the medication list:    Augmentin 875-125 Mg Tabs (Amoxicillin-pot clavulanate) .Marland Kitchen... 1 by mouth two times a day for 10 days for sinus infection Her updated medication list for this problem includes:    Keflex 500 Mg Caps (Cephalexin) .Marland Kitchen... 1 by mouth three times a day with food for 10 days  Problem # 2:  HYPERTENSION (ICD-401.9) Assessment: Unchanged  can no longer afford diltiazem 300- will change to 120 xr cap (generic copay) and f/u to check bp after the switch will be on look out for side eff or problems bp has been in good control  Her  updated medication list for this problem includes:    Diltiazem Hcl Cr 120 Mg Xr12h-cap (Diltiazem hcl) .Marland Kitchen... 1 by mouth once daily in am    Lisinopril 40 Mg Tabs (Lisinopril) .Marland Kitchen... Take 1 tablet by mouth once a day  BP today: 110/62 Prior BP: 108/70 (03/03/2009)  Labs Reviewed: K+: 4.5 (12/23/2008) Creat: : 0.8 (12/23/2008)   Chol: 155 (02/04/2009)    HDL: 50.40 (02/04/2009)   LDL: 105 (12/23/2008)   TG: 221.0 (02/04/2009)  Complete Medication List: 1)  Omeprazole 20 Mg Tbec (Omeprazole) .... Take 1 tablet by mouth every morning 2)  Diltiazem Hcl Cr 120 Mg Xr12h-cap (Diltiazem hcl) .Marland Kitchen.. 1 by mouth once daily in am 3)  Lisinopril 40 Mg Tabs (Lisinopril) .... Take 1 tablet by mouth once a day 4)  Zocor 20 Mg Tabs (Simvastatin) .Marland Kitchen.. 1 by mouth once daily 5)  Prednisone 10 Mg Tabs (Prednisone) .... Take 30 mg's daily 6)  Omnipred 1 % Susp (Prednisolone acetate) .... One drop in each eye two times a day 7)  Timolol Maleate 0.25 % Soln (Timolol maleate) .... One drop in each eye once daily 8)  Metformin Hcl 500 Mg Tabs (Metformin hcl) .... 1/2 tab by mouth two times a day 9)  Azathioprine 50 Mg Tabs (Azathioprine) .... Take one and a half  by mouth daily 10)  Glucometer  .... To check sugar once daily and as needed for diabetes 250.0 11)  Glucose Test Strips  .... To check sugar once daily and as needed for diabetes 250.0 12)  Ambien 10 Mg Tabs (Zolpidem tartrate) .... 1/2 to 1 by mouth at bedtime as needed insomnia 13)  Toviaz 8 Mg Xr24h-tab (Fesoterodine fumarate) .... Take one by mouth daily 14)  Keflex 500 Mg Caps (Cephalexin) .Marland Kitchen.. 1 by mouth three times a day with food for 10 days  Patient Instructions: 1)  finish your antibiotics for finger  2)  update me next week if finger does not look back to normal  3)  update me sooner if increase in redness or swelling or pain or any fever  4)  when you finish your diltiazem 300 for 3 months supply 5)  when you finish it start the diltiazem xr cap - once daily-- update me if any side effects or problems  6)  will re check your blood pressure when you follow up  Prescriptions: DILTIAZEM HCL CR 120 MG XR12H-CAP (DILTIAZEM HCL) 1 by mouth once daily in am  #90 x 3   Entered and Authorized by:   Judith Part MD   Signed by:   Judith Part MD on 03/06/2009   Method used:   Print then Give to  Patient   RxID:   [id number]

## 2010-04-01 NOTE — Assessment & Plan Note (Signed)
Summary: Cardiology Nuclear Testing  Nuclear Med Background Indications for Stress Test: Evaluation for Ischemia, PTCA Patency   History: Ablation, Angioplasty, Heart Catheterization, Myocardial Perfusion Study  History Comments: '03 [id number]; h/o SVT  Symptoms: DOE, Fatigue, Rapid HR, SOB    Nuclear Pre-Procedure Cardiac Risk Factors: History of Smoking, Hypertension, Lipids, NIDDM Caffeine/Decaff Intake: None NPO After: 6:30 PM Lungs: Clear.  O2 Sat 98% on RA. IV 0.9% NS with Angio Cath: 22g     IV Site: R Forearm IV Started by: Irean Hong, RN Chest Size (in) 38     Cup Size B     Height (in): 60 Weight (lb): 125 BMI: 24.50  Nuclear Med Study 1 or 2 day study:  1 day     Stress Test Type:  Treadmill/Lexiscan Reading MD:  Willa Rough, MD     Referring MD:  Valera Castle, MD Resting Radionuclide:  Technetium 21m Tetrofosmin     Resting Radionuclide Dose:  11 mCi  Stress Radionuclide:  Technetium 40m Tetrofosmin     Stress Radionuclide Dose:  33 mCi   Stress Protocol Exercise Time (min):  2:00 min     Max HR:  129 bpm     Predicted Max HR:  149 bpm  Max Systolic BP: 142 mm Hg     Percent Max HR:  86.58 %Rate Pressure Product:  40981  Lexiscan: 0.4 mg   Stress Test Technologist:  Rea College, CMA-N     Nuclear Technologist:  Domenic Polite, CNMT  Rest Procedure  Myocardial perfusion imaging was performed at rest 45 minutes following the intravenous administration of Technetium 4m Tetrofosmin.  Stress Procedure  The patient received IV Lexiscan 0.4 mg over 15-seconds with concurrent low level exercise and then Technetium 73m Tetrofosmin was injected at 30-seconds.  There were no significant changes with Lexiscan, other than occasional PVC's.  Quantitative spect images were obtained after a 45 minute delay.  QPS Raw Data Images:  Normal; no motion artifact; normal heart/lung ratio. Stress Images:  Normal homogeneous uptake in all areas of the myocardium. Rest  Images:  Normal homogeneous uptake in all areas of the myocardium. Subtraction (SDS):  No evidence of ischemia. Transient Ischemic Dilatation:  1.02  (Normal <1.22)  Lung/Heart Ratio:  0.30  (Normal <0.45)  Quantitative Gated Spect Images QGS EDV:  49 ml QGS ESV:  11 ml QGS EF:  78 % QGS cine images:  Normal motion  Findings Normal nuclear study      Overall Impression  Exercise Capacity: Lexiscan with no exercise. BP Response: Normal blood pressure response. Clinical Symptoms: heart racing feeling ECG Impression: No significant ST segment change suggestive of ischemia. Overall Impression: Normal stress nuclear study.  Appended Document: Cardiology Nuclear Testing excellent result....no change in treatment.

## 2010-04-01 NOTE — Assessment & Plan Note (Signed)
Summary: eph/jss   Visit Type:  EPH Primary Provider:  Colon Flattery Tower MD  CC:  pt was in [facility] for SVT 08/23/09....pt states she was back in the ED 7/26 for SVT again....said she had some chest tightness 7/26 on the way to the ED...no other complaints today.  History of Present Illness: Victoria Allison comes in today for discussion about her SVT.  She's now been emerged room twice in the past 4 weeks. On one occasion she was found to be in SVT at a rate of 169 beats per minute. It appeared to be AV nodal reentry. Just recently, this recurred again.  She is on low-dose diltiazem. Also note she is on simvastatin as well. Changes may be made.  She denies any angina, chest pain, orthopnea, PND.  She states at one time her blood pressure was below 70 when she reported to the emergency room.  Current Medications (verified): 1)  Omeprazole 20 Mg  Tbec (Omeprazole) .... Take 1 Tablet By Mouth Every Morning 2)  Diltiazem Hcl Cr 120 Mg Xr12h-Cap (Diltiazem Hcl) .Marland Kitchen.. 1 By Mouth Once Daily in Am 3)  Lisinopril 40 Mg Tabs (Lisinopril) .... Take 1 Tablet By Mouth Once A Day 4)  Zocor 20 Mg Tabs (Simvastatin) .Marland Kitchen.. 1 By Mouth Once Daily 5)  Prednisone 10 Mg Tabs (Prednisone) .... 5 Mg Once Daily 6)  Omnipred 1 % Susp (Prednisolone Acetate) .... One Drop in Each Eye Two Times A Day 7)  Timolol Maleate 0.25 % Soln (Timolol Maleate) .... One Drop in Each Eye Once Daily 8)  Metformin Hcl 500 Mg Tabs (Metformin Hcl) .... 1/2 Tab By Mouth Two Times A Day 9)  Azathioprine 50 Mg Tabs (Azathioprine) .... Take 2 Tablets Daily 10)  Glucometer .... To Check Sugar Once Daily and As Needed For Diabetes 250.0 11)  Glucose Test Strips .... To Check Sugar Once Daily and As Needed For Diabetes 250.0 12)  Ambien 10 Mg Tabs (Zolpidem Tartrate) .... 1/2 To 1 By Mouth At Bedtime As Needed Insomnia 13)  Toviaz 8 Mg Xr24h-Tab (Fesoterodine Fumarate) .... Take One By Mouth Daily  Allergies: 1)  ! * Tetanus 2)  !  Hydrocodone 3)  ! * Tessalon Pearles 4)  ! * Shrimp ? 5)  ! Asa 6)  Lipitor  Past History:  Past Medical History: Last updated: 09/22/2009 Allergic rhinitis Colonic polyps, hx of Coronary artery disease GERD Hyperlipidemia Hypertension Urinary incontinence- overactive bladder 5/09 lichen sclerosis (dx by gyn after abn pap) Cicatricial Pemphigoid, autoimmune d/o, affecting eyes, mouth, throat now (Dx [facility]) lichen sclerosis - vulvar  GYN--Dr Kincius Opthalmology--Dr. Dingledein urol- [facility] - Dr Tr[facility])- for pemphigoid   Past Surgical History: Last updated: 08/28/2008 Hysterectomy Oophorectomy LAD angioplasty (1995) Colonoscopy- polyps (1995) Cardiolite (08/2001) Nasal biopsy EGD- HH, esoph stricture- dilated (05/2005) Colonoscopy- diverticulosis, polyp Cataract extraction 5/09 CT of chest- granulomas in upper lobe of lung and liver  5/10 dexa normal 6/10 colonoscopy- polyp  Family History: Last updated: 22-Jan-2009 Father: deceased- lung cancer Mother: CVA, alzheimer's Siblings: 1 brother with HTN, 1 sister son with esoph cancer-- passed away   Social History: Last updated: 10/08/2007 Marital Status: Married (husband is fighting cancer) Children: 2 Occupation: retired exercises on stationary bike/walks Never Smoked Alcohol use-no  Risk Factors: Smoking Status: never (05/31/2007)  Review of Systems       negative other than history of present illness  Vital Signs:  Patient profile:   70 year old female Height:  60 inches Weight:      131 pounds BMI:     25.68 Pulse rate:   83 / minute Pulse rhythm:   irregular BP sitting:   88 / 60  (left arm) Cuff size:   large  Vitals Entered By: Danielle Rankin, CMA (September 24, 2009 2:02 PM)  Physical Exam  General:  Well developed, well nourished, in no acute distress. Head:  normocephalic and atraumatic Eyes:  PERRLA/EOM intact; conjunctiva and lids normal. Neck:  Neck supple, no JVD. No  masses, thyromegaly or abnormal cervical nodes. Chest Jerris Fleer:  no deformities or breast masses noted Lungs:  Clear bilaterally to auscultation and percussion. Heart:  Non-displaced PMI, chest non-tender; regular rate and rhythm, S1, S2 without murmurs, rubs or gallops. Carotid upstroke normal, no bruit. Normal abdominal aortic size, no bruits. Femorals normal pulses, no bruits. Pedals normal pulses. No edema, no varicosities. Msk:  Back normal, normal gait. Muscle strength and tone normal. Pulses:  pulses normal in all 4 extremities Extremities:  No clubbing or cyanosis. Neurologic:  Alert and oriented x 3. Skin:  Intact without lesions or rashes. Psych:  Normal affect.   EKG  Procedure date:  09/24/2009  Findings:      normal sinus rhythm, PR interval normal.  Impression & Recommendations:  Problem # 1:  SUPRAVENTRICULAR TACHYCARDIA (ICD-427.89) Assessment Deteriorated  I have recommended consultation with Dr. Ladona Ridgel  to consider ablation. She agrees. Will arrange. In the interim I cannot increase her diltiazem. Her pressure is low and it was even lower the emergency room according to the patient. Will add digoxin 0.25 mg per day to hopefully slow the rate if she recurs. This may be a very little utility. Her updated medication list for this problem includes:    Diltiazem Hcl Cr 120 Mg Xr12h-cap (Diltiazem hcl) .Marland Kitchen... 1 by mouth once daily in am    Lisinopril 40 Mg Tabs (Lisinopril) .Marland Kitchen... 1/2 tab  once daily  Orders: EKG w/ Interpretation (93000) Misc. Referral (Misc. Ref)  Problem # 2:  HYPERLIPIDEMIA (ICD-272.4) I will switch her to Crestor 10 mg a day. She will need followup blood work in about 6-8 weeks. Her updated medication list for this problem includes:    Crestor 10 Mg Tabs (Rosuvastatin calcium) .Marland Kitchen... 1 once daily  Problem # 3:  HYPERTENSION (ICD-401.9) Assessment: Improved I will cut her lisinopril to 20 mg per day. Her updated medication list for this problem  includes:    Diltiazem Hcl Cr 120 Mg Xr12h-cap (Diltiazem hcl) .Marland Kitchen... 1 by mouth once daily in am    Lisinopril 40 Mg Tabs (Lisinopril) .Marland Kitchen... 1/2 tab  once daily  Problem # 4:  CORONARY ARTERY DISEASE (ICD-414.00) Assessment: Unchanged  Her updated medication list for this problem includes:    Diltiazem Hcl Cr 120 Mg Xr12h-cap (Diltiazem hcl) .Marland Kitchen... 1 by mouth once daily in am    Lisinopril 40 Mg Tabs (Lisinopril) .Marland Kitchen... 1/2 tab  once daily  Problem # 5:  DIABETES MELLITUS, UNCONTROLLED (ICD-250.02)  Her updated medication list for this problem includes:    Lisinopril 40 Mg Tabs (Lisinopril) .Marland Kitchen... 1/2 tab  once daily    Metformin Hcl 500 Mg Tabs (Metformin hcl) .Marland Kitchen... 1/2 tab by mouth two times a day  Her updated medication list for this problem includes:    Lisinopril 40 Mg Tabs (Lisinopril) .Marland Kitchen... Take 1 tablet by mouth once a day    Metformin Hcl 500 Mg Tabs (Metformin hcl) .Marland Kitchen... 1/2 tab by mouth two times  a day  Patient Instructions: 1)  Your physician recommends that you schedule a follow-up appointment in: PENDING 2)  Your physician recommends that you return for lab work in:6-8 WEEKS FASTING LIPID LIVER 272.4 V58.69 3)  Your physician has recommended you make the following change in your medication: DECREASE LISINOPRIL TO 20 MG once daily 4)  STOP SIMVASTATIN 5)  START CRESTOR 10 G 1 once daily 6)  START DIGOXIN 0.25 MG 1 once daily 7)  You have been referred to [facility] APPT 8/4 /11 AT 4:00 PM Prescriptions: CRESTOR 10 MG TABS (ROSUVASTATIN CALCIUM) 1 once daily  #30 x 11   Entered by:   Scherrie Bateman, LPN   Authorized by:   Gaylord Shih, MD, [facility]   Signed by:   Scherrie Bateman, LPN on 91/47/8295   Method used:   Electronically to        [facility]       [address]       [place], [place]  [address]       Ph: [phone]       Fax: [phone]   RxID:   [id number] DIGOXIN 0.25 MG/ML SOLN (DIGOXIN) 1 once  daily  #30 x 11   Entered by:   Scherrie Bateman, LPN   Authorized by:   Gaylord Shih, MD, [facility]   Signed by:   Scherrie Bateman, LPN on 42/59/5638   Method used:   Electronically to        [facility]       [address]       [place], [place]  [address]       Ph: [phone]       Fax: [phone]   RxID:   [id number]

## 2010-04-01 NOTE — Cardiovascular Report (Signed)
Summary: [facility]   [facility]   Imported By: Roderic Ovens 10/28/2009 16:06:02  _____________________________________________________________________  External Attachment:    Type:   Image     Comment:   External Document

## 2010-04-01 NOTE — Assessment & Plan Note (Signed)
Summary: CONGESTION/CLE   Vital Signs:  Patient Profile:   70 Years Old Female Weight:      146 pounds Temp:     97.9 degrees F oral Pulse rate:   72 / minute Pulse rhythm:   regular BP sitting:   110 / 60  (left arm) Cuff size:   regular  Vitals Entered By: Sydell Axon (March 14, 2007 2:06 PM)                 Chief Complaint:  head congestion, cough, and needs refills on meds.  History of Present Illness: has had mucous in throat and nasal congestion and cough for about 3 days  cough is non productive- all nt long mucous is dark yellow no fever some ear and throat pain tried some benadryl- no help tried husb cough med- and it made her loopy- with hydrocodone  stress- son has cancer esophageal- is having sx  cut finger last weekend- had to go to hosp to get it sewn up    needs refil on meds- due for pe in march  no leg swelling no side effects from meds stationary bike and walking outdoors for exercise  wants to try generic prilosec- can no longer afford protonix   Current Allergies (reviewed today): ! * TETANUS ! HYDROCODONE LIPITOR  Past Medical History:    Allergic rhinitis    Colonic polyps, hx of    Coronary artery disease    GERD    Hyperlipidemia    Hypertension    Urinary incontinence- overactive bladder   Family History:    Reviewed history from 03/13/2007 and no changes required:       Father: deceased- lung cancer       Mother: CVA, alzheimer's       Siblings: 1 brother with HTN, 1 sister       son with esoph cancer  Social History:    Marital Status: Married    Children: 2    Occupation: retired    exercises on stationary bike/walks    Review of Systems      See HPI  General      Complains of chills and fever.      Denies fatigue.  Eyes      Denies eye irritation.  CV      Denies chest pain or discomfort.  GI      Denies diarrhea, nausea, and vomiting.  GU      Complains of incontinence and urinary frequency.      overactive bladder is worse  Derm      Denies rash.  Psych      mood ok despite stress   Physical Exam  General:     overweight but generally well appearing  Head:     normocephalic, atraumatic, and no abnormalities observed.  very slt maxillary sinus tenderness Eyes:     vision grossly intact, pupils equal, pupils round, pupils reactive to light, and no injection.   Ears:     R ear normal and L ear normal.   Nose:     nasal dischargemucosal pallor and mucosal edema.   Mouth:     pharynx pink and moist, no erythema, and no exudates.   Neck:     supple with full rom and no masses or thyromegally, no JVD or carotid bruit  Lungs:     Normal respiratory effort, chest expands symmetrically. Lungs are clear to auscultation, no crackles or wheezes. Heart:  Normal rate and regular rhythm. S1 and S2 normal without gallop, murmur, click, rub or other extra sounds. Extremities:     No clubbing, cyanosis, edema, or deformity noted with normal full range of motion of all joints.   Skin:     Intact without suspicious lesions or rashes Cervical Nodes:     No lymphadenopathy noted Psych:     pleasant and cheerful    Impression & Recommendations:  Problem # 1:  URI (ICD-465.9) suspect viral- for 3 days will tx symptomatically with cough supressant and expectorant update if inc facial pain or not imp in a week Her updated medication list for this problem includes:    Tessalon Perles 100 Mg Caps (Benzonatate) .Marland Kitchen... 1 by mouth three times a day as needed cough (swallow whole)   Problem # 2:  HYPERTENSION (ICD-401.9) blood pressure is well controlled f/u spring for check up Her updated medication list for this problem includes:    Diltiazem Hcl Coated Beads 300 Mg Cp24 (Diltiazem hcl coated beads) .Marland Kitchen... Take one by mouth once a day    Lisinopril 40 Mg Tabs (Lisinopril) .Marland Kitchen... Take 1 tablet by mouth once a day   Problem # 3:  HYPERLIPIDEMIA (ICD-272.4) on zocor- refil-  and watch diet f/u spring as planned The following medications were removed from the medication list:    Vytorin 10-20 Mg Tabs (Ezetimibe-simvastatin)  Her updated medication list for this problem includes:    Zocor 20 Mg Tabs (Simvastatin) .Marland Kitchen... Take one by mouth once a day   Problem # 4:  GERD (ICD-530.81) will switch from protonix to omeprazole for ins reasons update if any gerd symptoms on this Her updated medication list for this problem includes:    Omeprazole 20 Mg Tbec (Omeprazole) .Marland Kitchen... 1 by mouth qam   Complete Medication List: 1)  Omeprazole 20 Mg Tbec (Omeprazole) .Marland Kitchen.. 1 by mouth qam 2)  Ditropan 5 Mg Tabs (Oxybutynin chloride) .... Take one by mouth once a day 3)  Diltiazem Hcl Coated Beads 300 Mg Cp24 (Diltiazem hcl coated beads) .... Take one by mouth once a day 4)  Lisinopril 40 Mg Tabs (Lisinopril) .... Take 1 tablet by mouth once a day 5)  Omnipred 1 % Susp (Prednisolone acetate) .... Use one gtt ou daily 6)  Zocor 20 Mg Tabs (Simvastatin) .... Take one by mouth once a day 7)  Tessalon Perles 100 Mg Caps (Benzonatate) .Marland Kitchen.. 1 by mouth three times a day as needed cough (swallow whole)   Patient Instructions: 1)  you can try mucinex over the counter twice daily as directed and nasal saline spray for congestion 2)  can try either plain mucinex or DM ( the DM supresses cough) 3)  you can also put a vaporizer in bedroom to help with congestion 4)  tylenol over the counter as directed may help with aches, headache and fever 5)  call if symptoms worsen or if not improved in 4-5 days 6)  tessalon- is narcotic free cough supressant    Prescriptions: ZOCOR 20 MG  TABS (SIMVASTATIN) Take one by mouth once a day  #90 x 3   Entered and Authorized by:   Judith Part MD   Signed by:   Judith Part MD on 03/14/2007   Method used:   Print then Give to Patient   RxID:   [id number] LISINOPRIL 40 MG TABS (LISINOPRIL) Take 1 tablet by mouth once a day  #90 x 3    Entered and Authorized by:  Judith Part MD   Signed by:   Judith Part MD on 03/14/2007   Method used:   Print then Give to Patient   RxID:   [id number] DILTIAZEM HCL COATED BEADS 300 MG CP24 (DILTIAZEM HCL COATED BEADS) Take one by mouth once a day  #90 x 3   Entered and Authorized by:   Judith Part MD   Signed by:   Judith Part MD on 03/14/2007   Method used:   Print then Give to Patient   RxID:   [id number] DITROPAN 5 MG TABS (OXYBUTYNIN CHLORIDE) Take one by mouth once a day  #180 x 3   Entered and Authorized by:   Judith Part MD   Signed by:   Judith Part MD on 03/14/2007   Method used:   Print then Give to Patient   RxID:   [id number] OMEPRAZOLE 20 MG  TBEC (OMEPRAZOLE) 1 by mouth qam  #90 x 3   Entered and Authorized by:   Judith Part MD   Signed by:   Judith Part MD on 03/14/2007   Method used:   Print then Give to Patient   RxID:   [id number] TESSALON PERLES 100 MG  CAPS (BENZONATATE) 1 by mouth three times a day as needed cough (swallow whole)  #30 x 0   Entered and Authorized by:   Judith Part MD   Signed by:   Judith Part MD on 03/14/2007   Method used:   Print then Give to Patient   RxID:   [id number]  ] Current Allergies (reviewed today): ! * TETANUS ! HYDROCODONE LIPITOR

## 2010-04-01 NOTE — Consult Note (Signed)
Summary: [facility]  [facility]   Imported By: Lanelle Bal 09/01/2009 12:47:16  _____________________________________________________________________  External Attachment:    Type:   Image     Comment:   External Document

## 2010-04-01 NOTE — Assessment & Plan Note (Signed)
Summary: EPH/POST ABLATION   Visit Type:  Follow-up Primary Provider:  Judith Part MD   History of Present Illness: Mrs. Carrithers returns today for follow up of SVT.  She has a h/o DM and HTN.  She also has dyslipidemia.  She underwent EPS/RFA of her SVT several weeks ago where she was found to have AVNRT and underwent successful ablation.  Since then she has done well.  Her blood pressure has gone down and her dose of lisinopril has been reduced.  Her blood pressure remains on the low side.  Current Medications (verified): 1)  Omeprazole 20 Mg  Tbec (Omeprazole) .... Take 1 Tablet By Mouth Every Morning 2)  Lisinopril 20 Mg Tabs (Lisinopril) .... Take One Tablet By Mouth Daily 3)  Crestor 10 Mg Tabs (Rosuvastatin Calcium) .Marland Kitchen.. 1 Once Daily 4)  Omnipred 1 % Susp (Prednisolone Acetate) .... One Drop in Each Eye Daily 5)  Timolol Maleate 0.25 % Soln (Timolol Maleate) .... One Drop in Each Eye Once Daily 6)  Metformin Hcl 500 Mg Tabs (Metformin Hcl) .... 1/2 Tab By Mouth Two Times A Day With Meals 7)  Azathioprine 50 Mg Tabs (Azathioprine) .... Take 3 Tablets Daily 8)  Glucometer .... To Check Sugar Once Daily and As Needed For Diabetes 250.0 9)  Glucose Test Strips .... To Check Sugar Once Daily and As Needed For Diabetes 250.0 10)  Toviaz 8 Mg Xr24h-Tab (Fesoterodine Fumarate) .... Take One By Mouth Daily 11)  Prednisone 5 Mg Tabs (Prednisone) .... Take 4 Tablet By Mouth Once A Day 12)  Tylenol 325 Mg Tabs (Acetaminophen) .... Otc As Directed.  Allergies: 1)  ! * Tetanus 2)  ! Hydrocodone 3)  ! * Tessalon Pearles 4)  ! * Shrimp ? 5)  ! Asa 6)  Lipitor  Past History:  Past Medical History: Last updated: 09/22/2009 Allergic rhinitis Colonic polyps, hx of Coronary artery disease GERD Hyperlipidemia Hypertension Urinary incontinence- overactive bladder 5/09 lichen sclerosis (dx by gyn after abn pap) Cicatricial Pemphigoid, autoimmune d/o, affecting eyes, mouth, throat now  (Dx [facility]) lichen sclerosis - vulvar  GYN--Dr Kincius Opthalmology--Dr. Dingledein urol- [facility] - Dr Trisha [facility])- for pemphigoid   Past Surgical History: Last updated: 10/09/2009 Hysterectomy Oophorectomy LAD angioplasty (1995) Colonoscopy- polyps (1995) Cardiolite (08/2001) Nasal biopsy EGD- HH, esoph stricture- dilated (05/2005) Colonoscopy- diverticulosis, polyp Cataract extraction 5/09 CT of chest- granulomas in upper lobe of lung and liver  5/10 dexa normal 6/10 colonoscopy- polyp 8/11 heart ablation/ catheter  Review of Systems  The patient denies chest pain, syncope, dyspnea on exertion, and peripheral edema.    Vital Signs:  Patient profile:   70 year old female Height:      60 inches Weight:      125 pounds BMI:     24.50 Pulse rate:   79 / minute BP sitting:   100 / 60  (left arm)  Vitals Entered By: Laurance Flatten CMA (December 01, 2009 2:14 PM)  Physical Exam  General:  well appearing mood face from prednisone Head:  normocephalic, atraumatic, and no abnormalities observed.   Eyes:  vision grossly intact, pupils equal, pupils round, and pupils reactive to light.  no conjunctival pallor, injection or icterus  Mouth:  pharynx pink and moist.   Neck:  supple with full rom and no masses or thyromegally, no JVD or carotid bruit  Lungs:  Normal respiratory effort, chest expands symmetrically. Lungs are clear to auscultation, no crackles or wheezes. Heart:  Normal  rate and regular rhythm. S1 and S2 normal without gallop, murmur, click, rub or other extra sounds. Abdomen:  soft, non-tender, and normal bowel sounds.  no renal bruits  Msk:  no new joint changes Pulses:  R and L carotid,radial,femoral,dorsalis pedis and posterior tibial pulses are full and equal bilaterally Extremities:  no cce  Neurologic:  strength normal in all extremities, sensation intact to light touch, and DTRs symmetrical and normal.     EKG  Procedure date:  12/01/2009  Findings:       Normal sinus rhythm with rate of: 79.   Impression & Recommendations:  Problem # 1:  SUPRAVENTRICULAR TACHYCARDIA (ICD-427.89) She has had no recurrent symptoms siince her ablation.  I have asked her to call me if she has additional symptoms. Her updated medication list for this problem includes:    Lisinopril 10 Mg Tabs (Lisinopril) ..... One by mouth daily  Problem # 2:  HYPERTENSION (ICD-401.9) Her blood pressure continues to be on the low side.  I have asked her to reduce her lisiinopril further and followup with Dr. Daleen Squibb next week.  She may be able to come off of all her blood pressure meds. Her updated medication list for this problem includes:    Lisinopril 10 Mg Tabs (Lisinopril) ..... One by mouth daily  Patient Instructions: 1)  Your physician recommends that you schedule a follow-up appointment in: as needed 2)  Your physician has recommended you make the following change in your medication: decrease Lisinopril to 10mg  daily Prescriptions: LISINOPRIL 10 MG TABS (LISINOPRIL) one by mouth daily  #30 x 11   Entered by:   Dennis Bast, RN, BSN   Authorized by:   Laren Boom, MD, [facility]   Signed by:   Dennis Bast, RN, BSN on 12/01/2009   Method used:   Electronically to        [facility]       [address]       [place], [place]  [address]       Ph: [phone]       Fax: [phone]   RxID:   [id number]

## 2010-04-01 NOTE — Consult Note (Signed)
[facility]   Imported By: Sherian Rein 03/20/2009 15:20:48  _____________________________________________________________________  External Attachment:    Type:   Image     Comment:   External Document

## 2010-04-01 NOTE — Consult Note (Signed)
Summary: [facility]  [facility]   Imported By: Eleonore Chiquito 07/09/2007 12:05:16  _____________________________________________________________________  External Attachment:    Type:   Image     Comment:   External Document

## 2010-04-01 NOTE — Assessment & Plan Note (Signed)
Summary: FOLLOW UP ON LABS CYD   Vital Signs:  Patient profile:   70 year old female Height:      60 inches Weight:      126 pounds BMI:     24.70 Temp:     97.8 degrees F oral Pulse rate:   84 / minute Pulse rhythm:   regular BP sitting:   114 / 64  (left arm) Cuff size:   regular  Vitals Entered By: Lewanda Rife LPN (November 12, 2009 8:53 AM) CC: follow-up visit after labs   History of Present Illness: here for f/u of hyperglycemia and HTN, and lipids   has lost a lot of weight since last december - doing great with that and feels really good - from 149/ to 126    has been feeling pretty good  wt is stable   HTN is wel controlled 114/64 today   was recently in hosp for svt -- is feeling much better now -- Dr Ladona Ridgel   AIC 6.2- up a bit, nl microalb, on ace   tsh nl   has pemphigoid on prednisone- that is frustrating   cholesterol down to LDL of 79! on crestor and good diet  is getting back to exercise   opthy 3 months ago DrDingeldein - goes very frequently  Allergies: 1)  ! * Tetanus 2)  ! Hydrocodone 3)  ! * Tessalon Pearles 4)  ! * Shrimp ? 5)  ! Asa 6)  Lipitor  Past History:  Past Medical History: Last updated: 09/22/2009 Allergic rhinitis Colonic polyps, hx of Coronary artery disease GERD Hyperlipidemia Hypertension Urinary incontinence- overactive bladder 5/09 lichen sclerosis (dx by gyn after abn pap) Cicatricial Pemphigoid, autoimmune d/o, affecting eyes, mouth, throat now (Dx [facility]) lichen sclerosis - vulvar  GYN--Dr Kincius Opthalmology--Dr. Dingledein urol- [facility] - Dr Trisha Mangle [facility])- for pemphigoid   Past Surgical History: Last updated: 10/09/2009 Hysterectomy Oophorectomy LAD angioplasty (1995) Colonoscopy- polyps (1995) Cardiolite (08/2001) Nasal biopsy EGD- HH, esoph stricture- dilated (05/2005) Colonoscopy- diverticulosis, polyp Cataract extraction 5/09 CT of chest- granulomas in upper lobe of lung and liver  5/10  dexa normal 6/10 colonoscopy- polyp 8/11 heart ablation/ catheter  Family History: Last updated: 01/12/09 Father: deceased- lung cancer Mother: CVA, alzheimer's Siblings: 1 brother with HTN, 1 sister son with esoph cancer-- passed away   Social History: Last updated: 10/08/2007 Marital Status: Married (husband is fighting cancer) Children: 2 Occupation: retired exercises on stationary bike/walks Never Smoked Alcohol use-no  Risk Factors: Smoking Status: never (05/31/2007)  Review of Systems General:  Complains of fatigue; denies fever, loss of appetite, and malaise. Eyes:  Denies blurring and eye irritation. CV:  Denies chest pain or discomfort, lightheadness, and palpitations. Resp:  Denies cough, shortness of breath, and wheezing. GI:  Denies abdominal pain, change in bowel habits, and indigestion. MS:  Denies joint redness and joint swelling. Derm:  Denies itching and lesion(s). Neuro:  Denies numbness, tingling, and tremors. Psych:  mood is ok. Endo:  Denies cold intolerance, excessive thirst, excessive urination, and heat intolerance. Heme:  Denies abnormal bruising and bleeding.  Physical Exam  General:  well appearing mood face from prednisone Head:  normocephalic, atraumatic, and no abnormalities observed.   Eyes:  vision grossly intact, pupils equal, pupils round, and pupils reactive to light.  no conjunctival pallor, injection or icterus  Mouth:  pharynx pink and moist.   Neck:  supple with full rom and no masses or thyromegally, no JVD or carotid bruit  Chest Wall:  No deformities, masses, or tenderness noted. Lungs:  Normal respiratory effort, chest expands symmetrically. Lungs are clear to auscultation, no crackles or wheezes. Heart:  Normal rate and regular rhythm. S1 and S2 normal without gallop, murmur, click, rub or other extra sounds. Abdomen:  soft, non-tender, and normal bowel sounds.  no renal bruits  Msk:  no new joint changes Pulses:  R and L  carotid,radial,femoral,dorsalis pedis and posterior tibial pulses are full and equal bilaterally Extremities:  no cce  Neurologic:  strength normal in all extremities, sensation intact to light touch, and DTRs symmetrical and normal.   Skin:  Intact without suspicious lesions or rashes Cervical Nodes:  No lymphadenopathy noted Psych:  normal affect, talkative and pleasant   Diabetes Management Exam:    Foot Exam (with socks and/or shoes not present):       Sensory-Pinprick/Light touch:          Left medial foot (L-4): normal          Left dorsal foot (L-5): normal          Left lateral foot (S-1): normal          Right medial foot (L-4): normal          Right dorsal foot (L-5): normal          Right lateral foot (S-1): normal       Sensory-Monofilament:          Left foot: normal          Right foot: normal       Inspection:          Left foot: normal          Right foot: normal       Nails:          Left foot: normal          Right foot: normal    Eye Exam:       Eye Exam done elsewhere          Date: 08/11/2009          Results: normal          Done by: Dingeldien    Impression & Recommendations:  Problem # 1:  HYPERLIPIDEMIA (ICD-272.4) Assessment Improved  this is bettter with crestor and diet - well controlled rev low sat fat diet  lab and f/u 6 mo  Her updated medication list for this problem includes:    Crestor 10 Mg Tabs (Rosuvastatin calcium) .Marland Kitchen... 1 once daily  Labs Reviewed: SGOT: 22 (11/05/2009)   SGPT: 22 (11/05/2009)   HDL:40.90 (11/05/2009), 60.30 (05/04/2009)  LDL:79 (11/05/2009), 104 (16/11/9602)  Chol:145 (11/05/2009), 185 (05/04/2009)  Trig:127.0 (11/05/2009), 103.0 (05/04/2009)  Problem # 2:  HYPERTENSION (ICD-401.9) Assessment: Unchanged  good bp today on current meds  no changes  lab and f/u 6 mo  remain active The following medications were removed from the medication list:    Diltiazem Hcl Cr 120 Mg Xr12h-cap (Diltiazem hcl) .Marland Kitchen... 1 by  mouth once daily in am Her updated medication list for this problem includes:    Lisinopril 20 Mg Tabs (Lisinopril) .Marland Kitchen... Take one tablet by mouth daily  BP today: 114/64 Prior BP: 120/70 (10/01/2009)  Labs Reviewed: K+: 4.5 (11/05/2009) Creat: : 0.5 (11/05/2009)   Chol: 145 (11/05/2009)   HDL: 40.90 (11/05/2009)   LDL: 79 (11/05/2009)   TG: 127.0 (11/05/2009)  Problem # 3:  SUPRAVENTRICULAR TACHYCARDIA (ICD-427.89) Assessment: Improved resolved at this time-  for close cardiac f/u  Problem # 4:  DIABETES MELLITUS, UNCONTROLLED (ICD-250.02) Assessment: Unchanged  this is fairly controlled with metformin (pt on prednisone) keep working on wt loss on ace gets eye exams  disc healthy diet (low simple sugar/ choose complex carbs/ low sat fat) diet and exercise in detail  Her updated medication list for this problem includes:    Lisinopril 20 Mg Tabs (Lisinopril) .Marland Kitchen... Take one tablet by mouth daily    Metformin Hcl 500 Mg Tabs (Metformin hcl) .Marland Kitchen... 1/2 tab by mouth two times a day with meals  Labs Reviewed: Creat: 0.5 (11/05/2009)     Last Eye Exam: normal (05/11/2008) Reviewed HgBA1c results: 6.2 (11/05/2009)  6.0 (05/04/2009)  Complete Medication List: 1)  Omeprazole 20 Mg Tbec (Omeprazole) .... Take 1 tablet by mouth every morning 2)  Lisinopril 20 Mg Tabs (Lisinopril) .... Take one tablet by mouth daily 3)  Crestor 10 Mg Tabs (Rosuvastatin calcium) .Marland Kitchen.. 1 once daily 4)  Omnipred 1 % Susp (Prednisolone acetate) .... One drop in each eye daily 5)  Timolol Maleate 0.25 % Soln (Timolol maleate) .... One drop in each eye once daily 6)  Metformin Hcl 500 Mg Tabs (Metformin hcl) .... 1/2 tab by mouth two times a day with meals 7)  Azathioprine 50 Mg Tabs (Azathioprine) .... Take 2 tablets daily 8)  Glucometer  .... To check sugar once daily and as needed for diabetes 250.0 9)  Glucose Test Strips  .... To check sugar once daily and as needed for diabetes 250.0 10)  Toviaz 8 Mg  Xr24h-tab (Fesoterodine fumarate) .... Take one by mouth daily 11)  Prednisone 5 Mg Tabs (Prednisone) .... Take 1 tablet by mouth once a day 12)  Tylenol 325 Mg Tabs (Acetaminophen) .... Otc as directed.  Patient Instructions: 1)  cholesterol looks better - keep watching diet  2)  sugar control is ok -- keep watching sugar in diet  3)  you can take a plain antihistamine without decongestant for allergies (claritin , zyrtec) - are examples  4)  try to exercise when you can  5)  no change in medicines  6)  schedule fasting labs in 6 months and then follow up AIC/ renal / ast/ alt   272 and hyperglycemia   Current Allergies (reviewed today): ! * TETANUS ! HYDROCODONE ! * TESSALON PEARLES ! * SHRIMP ? ! ASA LIPITOR  Prevention & Chronic Care Immunizations   Influenza vaccine: Fluvax MCR  (12/23/2008)    Tetanus booster: 06/03/2008: Td    Pneumococcal vaccine: Pneumovax  (12/23/2008)    H. zoster vaccine: Not documented  Colorectal Screening   Hemoccult: Negative  (06/07/2006)    Colonoscopy: Adenomatous Polyp  (08/19/2008)   Colonoscopy due: 06/2008  Other Screening   Pap smear: abnormal  (05/31/2007)    Mammogram: Negative  (07/03/2008)   Mammogram action/deferral: Screening mammogram in 1 year.     (07/03/2008)   Mammogram due: 06/2008    DXA bone density scan: normal  (06/30/2008)   Smoking status: never  (05/31/2007)  Diabetes Mellitus   HgbA1C: 6.2  (11/05/2009)    Eye exam: normal  (08/11/2009)   Eye exam due: 07/2010    Foot exam: yes  (11/12/2009)   High risk foot: Not documented   Foot care education: Not documented    Urine microalbumin/creatinine ratio: 0.8  (11/05/2009)  Lipids   Total Cholesterol: 145  (11/05/2009)   LDL: 79  (11/05/2009)   LDL Direct: 86.7  (02/04/2009)  HDL: 40.90  (11/05/2009)   Triglycerides: 127.0  (11/05/2009)    SGOT (AST): 22  (11/05/2009)   SGPT (ALT): 22  (11/05/2009)   Alkaline phosphatase: 57  (06/02/2008)    Total bilirubin: 0.8  (06/02/2008)  Hypertension   Last Blood Pressure: 114 / 64  (11/12/2009)   Serum creatinine: 0.5  (11/05/2009)   Serum potassium 4.5  (11/05/2009)  Self-Management Support :    Diabetes self-management support: Not documented    Hypertension self-management support: Not documented    Lipid self-management support: Not documented

## 2010-04-01 NOTE — Letter (Signed)
Summary: [facility] / URINARY INCONTINENCE / DR. Arlys John [facility]E  [facility] / URINARY INCONTINENCE / DR. Arlys John COPE   Imported By: Carin Primrose 05/07/2008 11:43:36  _____________________________________________________________________  External Attachment:    Type:   Image     Comment:   External Document

## 2010-04-01 NOTE — Consult Note (Signed)
Summary: Dr. Mathis Bud [facility],Note`  Dr. Mathis Bud [facility],Note`   Imported By: Beau Fanny 11/15/2007 16:50:18  _____________________________________________________________________  External Attachment:    Type:   Image     Comment:   External Document

## 2010-04-01 NOTE — Letter (Signed)
Summary: [facility]  [facility]   Imported By: Maryln Gottron 03/22/2010 12:33:22  _____________________________________________________________________  External Attachment:    Type:   Image     Comment:   External Document

## 2010-04-01 NOTE — Letter (Signed)
Summary: [facility]  [facility]   Imported By: Maryln Gottron 10/27/2008 08:46:35  _____________________________________________________________________  External Attachment:    Type:   Image     Comment:   External Document

## 2010-04-01 NOTE — Letter (Signed)
Summary: Generic Letter  [facility]  [address] [place], [place] [address]   Phone: [phone]  Fax: [phone]    08/05/2009  Victoria Allison [address] [place], [place]  [address],   To whom it may concern,   My patient Victoria Allison suffers from high blood pressure.  She takes medications for maintenance of this problem that control it well.  Please contact me if any problems     Sincerely,   Roxy Manns MD

## 2010-04-01 NOTE — Progress Notes (Signed)
Summary: Letter for court summons release  Phone Note Call from Patient Call back at Home Phone [phone]   Caller: Patient Call For: Dr. Milinda Antis Summary of Call: Patient says she has been summoned to court in [facility] and submitted her plea to be released from duty because of her urinary problems and wearing 2 hearing aides.  They responded to her by saying that she needed her MD to write a detailed letter explaining her medical problems and why she cannot serve.  Patient is aware that you are not back for another week and a half and says that will be fine.  The letter has to be in 60 days prior to court date which is not until November.  She says she is planning surgery for the urinary problems but when she was sent to the OB/GYN for other problems, she was told that the GYN problem needs to be taken care of first.  She says she is wearing 2 hearing aides and still cannot really understand anyone very well unless she is looking directly at them and she still sometimes gets information mixed up. Initial call taken by: Delilah Shan,  August 21, 2007 1:46 PM  Follow-up for Phone Call        let her know I will do her letter as soon as I get back for hearing problem-- send this phone note back to me, thanks  Follow-up by: Judith Part MD,  August 21, 2007 7:46 PM  Additional Follow-up for Phone Call Additional follow up Details #1::        Patient Advised. Lugene Fuquay  August 22, 2007 9:17 AM

## 2010-04-01 NOTE — Progress Notes (Signed)
Summary: ?uti?   Phone Note Call from Patient   Summary of Call: pt thinks she has a bladder infection wants an rx called in Initial call taken by: Liane Comber,  August 10, 2006 10:50 AM  Follow-up for Phone Call        I called pt and told her to come in @ 11:30 to give urine specimen. (I figured that was what you would advise since no appts) Follow-up by: Liane Comber,  August 10, 2006 10:53 AM  Additional Follow-up for Phone Call Additional follow up Details #1::        Ua is pos please call in cipro 250 mg 1 by mouth two times a day for 5 days and f/u if no imp Additional Follow-up by: Judith Part MD,  August 10, 2006 1:49 PM   Additional Follow-up for Phone Call Additional follow up Details #2::    Advised pt. Cipro called to [facility] # [phone] Follow-up by: Lowella Petties,  August 10, 2006 2:48 PM    Laboratory Results   Urine Tests    Urine Microscopic WBC/hpf: many RBC/hpf: 0 Bacteria: plus 1 Mucous: 0 Epithelial: 1-2 Crystals/LPF: 0 Casts/LPF: 0 Yeast/HPF: 0

## 2010-04-01 NOTE — Progress Notes (Signed)
Summary: Swollen finger  Phone Note Call from Patient Call back at Home Phone [phone]   Caller: Patient Call For: Judith Part MD Summary of Call: pt is diabetic and stuck her finger Sunday a.m. and on Monday morning, she noticed it was swollen and red, and today it's really sore. Please advise what she should do? Initial call taken by: Mervin Hack CMA Duncan Dull),  March 03, 2009 11:12 AM  Follow-up for Phone Call        want to make sure it does not look like infection  f/u when able to check it  keep very clean - antibacterial soap and water/ elevate/ warm compress  if fever -- update me asap Follow-up by: Judith Part MD,  March 03, 2009 11:48 AM  Additional Follow-up for Phone Call Additional follow up Details #1::        Appt made for pt to be seen today. Additional Follow-up by: Lowella Petties CMA,  March 03, 2009 12:42 PM

## 2010-04-01 NOTE — Assessment & Plan Note (Signed)
Summary: RASH/DLO   Vital Signs:  Patient Profile:   70 Years Old Female Height:     60.25 inches Weight:      143 pounds Temp:     97.5 degrees F oral Pulse rate:   69 / minute BP sitting:   121 / 70  (left arm) Cuff size:   regular  Vitals Entered By: Cooper Render (June 04, 2007 11:17 AM)                 Chief Complaint:  rash.  History of Present Illness: Here due to rash 4/4 onset on medial thigh and lower abd in early morning, by night was "covered".  Took Benadryl 25mg  on 4/4 hs and last night--makes sleepu.  Only new thing in environment is betamethasonecream on 4/2 and 3/09 and this morning to perineal area aparingly--helping some.  No hx of similar rash.  Had shrimp on 4/3--has not had reaction before. .     Current Allergies (reviewed today): ! * TETANUS ! HYDROCODONE ! * TESSALON PEARLES LIPITOR     Review of Systems      See HPI   Physical Exam  General:     alert, well-developed, well-nourished, and well-hydrated.  NAD Skin:     slightly raised red rash thighs, abd, back, arms , chest--evodemce pf scratching --especially post thighs.  is in plaques and small splotches Psych:     normally interactive and good eye contact.      Impression & Recommendations:  Problem # 1:  DERMATITIS (ICD-692.9) Assessment: New new dermatitis--et unclear will add clarines 5 mg once daily call if not improved in AM and will add steriod dose pack cool compresses prn Her updated medication list for this problem includes:    Diprolene Af 0.05 % Crea (Aug betamethasone dipropionate) .Marland Kitchen... Apply to affected area once daily for 10 days    Clarinex 5 Mg Tabs (Desloratadine) .Marland Kitchen... 1 once daily for rash   Complete Medication List: 1)  Omeprazole 20 Mg Tbec (Omeprazole) .Marland Kitchen.. 1 by mouth qam 2)  Ditropan 5 Mg Tabs (Oxybutynin chloride) .... Take one by mouth once a day 3)  Diltiazem Hcl Coated Beads 300 Mg Cp24 (Diltiazem hcl coated beads) .... Take one by mouth  once a day 4)  Lisinopril 40 Mg Tabs (Lisinopril) .... Take 1 tablet by mouth once a day 5)  Omnipred 1 % Susp (Prednisolone acetate) .... Use one gtt ou daily 6)  Zocor 20 Mg Tabs (Simvastatin) .... Take one by mouth once a day 7)  Diprolene Af 0.05 % Crea (Aug betamethasone dipropionate) .... Apply to affected area once daily for 10 days 8)  Clarinex 5 Mg Tabs (Desloratadine) .Marland Kitchen.. 1 once daily for rash     Prescriptions: CLARINEX 5 MG  TABS (DESLORATADINE) 1 once daily for rash Brand medically necessary #5 x 0   Entered and Authorized by:   Gildardo Griffes FNP   Signed by:   Gildardo Griffes FNP on 06/04/2007   Method used:   Print then Give to Patient   RxID:   [id number]  ] Prior Medications (reviewed today): OMEPRAZOLE 20 MG  TBEC (OMEPRAZOLE) 1 by mouth qam DITROPAN 5 MG TABS (OXYBUTYNIN CHLORIDE) Take one by mouth once a day DILTIAZEM HCL COATED BEADS 300 MG CP24 (DILTIAZEM HCL COATED BEADS) Take one by mouth once a day LISINOPRIL 40 MG TABS (LISINOPRIL) Take 1 tablet by mouth once a day OMNIPRED 1 %  SUSP (PREDNISOLONE ACETATE) use  one gtt ou daily ZOCOR 20 MG  TABS (SIMVASTATIN) Take one by mouth once a day DIPROLENE AF 0.05 %  CREA (AUG BETAMETHASONE DIPROPIONATE) apply to affected area once daily for 10 days Current Allergies (reviewed today): ! * TETANUS ! HYDROCODONE ! * TESSALON PEARLES LIPITOR

## 2010-04-01 NOTE — Assessment & Plan Note (Signed)
Summary: rov per amber call per discharge/lg   Visit Type:  rov Primary Provider:  Colon Flattery Tower MD  CC:  no cardiac complaints today .  History of Present Illness: Victoria Allison comes in today for some shortness of breath and history of coronary disease. She's done well since she had her ablation for SVT.   She still has significant shortness of breath with activity. She denies a true angina. She is diabetic. Gen. motor PTCA in the 90s. I do not have the paper chart.  She denies orthopnea, PND or edema.  Current Medications (verified): 1)  Omeprazole 20 Mg  Tbec (Omeprazole) .... Take 1 Tablet By Mouth Every Morning 2)  Lisinopril 10 Mg Tabs (Lisinopril) .... One By Mouth Daily 3)  Crestor 10 Mg Tabs (Rosuvastatin Calcium) .Marland Kitchen.. 1 Once Daily 4)  Omnipred 1 % Susp (Prednisolone Acetate) .... One Drop in Each Eye Daily 5)  Timolol Maleate 0.25 % Soln (Timolol Maleate) .... One Drop in Each Eye Once Daily 6)  Metformin Hcl 500 Mg Tabs (Metformin Hcl) .... 1/2 Tab By Mouth Two Times A Day With Meals 7)  Azathioprine 50 Mg Tabs (Azathioprine) .... Take 3 Tablets Daily 8)  Glucometer .... To Check Sugar Once Daily and As Needed For Diabetes 250.0 9)  Glucose Test Strips .... To Check Sugar Once Daily and As Needed For Diabetes 250.0 10)  Toviaz 8 Mg Xr24h-Tab (Fesoterodine Fumarate) .... Take One By Mouth Daily 11)  Prednisone 5 Mg Tabs (Prednisone) .... Take 4 Tablet By Mouth Once A Day 12)  Tylenol 325 Mg Tabs (Acetaminophen) .... Otc As Directed.  Allergies: 1)  ! * Tetanus 2)  ! Hydrocodone 3)  ! * Tessalon Pearles 4)  ! * Shrimp ? 5)  ! Asa 6)  Lipitor  Past History:  Past Surgical History: Last updated: 10/09/2009 Hysterectomy Oophorectomy LAD angioplasty (1995) Colonoscopy- polyps (1995) Cardiolite (08/2001) Nasal biopsy EGD- HH, esoph stricture- dilated (05/2005) Colonoscopy- diverticulosis, polyp Cataract extraction 5/09 CT of chest- granulomas in upper lobe of lung and  liver  5/10 dexa normal 6/10 colonoscopy- polyp 8/11 heart ablation/ catheter  Review of Systems       negative other than history of present illness  Vital Signs:  Patient profile:   69 year old female Height:      60 inches Weight:      125 pounds BMI:     24.50 Pulse rate:   78 / minute Pulse rhythm:   regular BP sitting:   132 / 68  (left arm) Cuff size:   large  Vitals Entered By: Danielle Rankin, CMA (December 09, 2009 9:45 AM)  Physical Exam  General:  obese.   Head:  normocephalic and atraumatic Eyes:  PERRLA/EOM intact; conjunctiva and lids normal. Neck:  Neck supple, no JVD. No masses, thyromegaly or abnormal cervical nodes. Chest Wall:  no deformities or breast masses noted Lungs:  Clear bilaterally to auscultation and percussion. Heart:  PMI nondisplaced, normal S1-S2, no murmur or gallop. Carotids full without bruits. Msk:  Back normal, normal gait. Muscle strength and tone normal. Pulses:  pulses normal in all 4 extremities Extremities:  No clubbing or cyanosis. Neurologic:  Alert and oriented x 3. Skin:  Intact without lesions or rashes. Psych:  Normal affect.   Impression & Recommendations:  Problem # 1:  SUPRAVENTRICULAR TACHYCARDIA (ICD-427.89) Assessment Improved  Her updated medication list for this problem includes:    Lisinopril 10 Mg Tabs (Lisinopril) ..... One by mouth daily  Problem # 2:  HYPERTENSION (ICD-401.9) Assessment: Improved  Her updated medication list for this problem includes:    Lisinopril 10 Mg Tabs (Lisinopril) ..... One by mouth daily  Problem # 3:  HYPERLIPIDEMIA (ICD-272.4)  Her updated medication list for this problem includes:    Crestor 10 Mg Tabs (Rosuvastatin calcium) .Marland Kitchen... 1 once daily  Problem # 4:  CORONARY ARTERY DISEASE (ICD-414.00)  Will obtain Lexus scan Myoview because of her history of increased shortness of breath and coronary disease. Rule out obstructive coronary disease. Her updated medication list  for this problem includes:    Lisinopril 10 Mg Tabs (Lisinopril) ..... One by mouth daily  Orders: Nuclear Stress Test (Nuc Stress Test)  Clinical Reports Reviewed:  CXR:  08/26/2009: CXR Results:    Clinical Data: Shortness of breath, chest pain.    PORTABLE CHEST - 1 VIEW    Comparison: None    Findings: Heart is upper limits normal in size.  There are low lung   volumes with bibasilar atelectasis and mild accentuation of the   vascular markings.  No fluid opacities otherwise.  No effusions.    IMPRESSION:   Low lung volumes, bibasilar atelectasis.    Read By:  Charlett Nose,  M.D.   Released By:  Charlett Nose,  M.D.   Patient Instructions: 1)  Your physician recommends that you schedule a follow-up appointment in: 1 year with Dr. Daleen Squibb 2)  Your physician recommends that you continue on your current medications as directed. Please refer to the Current Medication list given to you today. 3)  Your physician has requested that you have a lexiscan myoview.  For further information please visit [email or web address]  Please follow instruction sheet, as given.

## 2010-04-01 NOTE — Letter (Signed)
Summary: [facility]  [facility]   Imported By: Lanelle Bal 09/30/2009 10:28:10  _____________________________________________________________________  External Attachment:    Type:   Image     Comment:   External Document

## 2010-04-01 NOTE — Letter (Signed)
Summary: Generic Letter  [facility]  [address] [place], [place] [address]   Phone: [phone]  Fax: [phone]    07/12/2008    [facility] [address] [place], [place]  [address]     Dear Ms. Souder,    Your mammogram was normal, please repeat screening in one year.    Sincerely,   Liane Comber

## 2010-04-01 NOTE — Assessment & Plan Note (Signed)
Summary: cough,persistent/bir   Vital Signs:  Patient Profile:   70 Years Old Female Height:     60.25 inches Weight:      143 pounds Temp:     97.9 degrees F oral Pulse rate:   80 / minute Pulse rhythm:   regular BP sitting:   130 / 70  (left arm) Cuff size:   regular  Vitals Entered By: Liane Comber (October 08, 2007 11:44 AM)                 Chief Complaint:  Cough.  History of Present Illness: in past determined that cough is not caused by ace (no imp off of it)  CT lungs - granuloma - thought to be benign   is sick- got cough and st and headache-- 11/2 weeks caught it from her husband-- he went to doctor-- sinusitis?-- no pneumonia, but may have bronchitis  can cough a little up -- yellow (gets caught in her throat) not wheezing  some nasal symptoms with drainage down back of throat   took some chlorcedin HBP- helped her sleep   tried benadryl-- not effective     Current Allergies (reviewed today): ! * TETANUS ! HYDROCODONE ! * TESSALON PEARLES ! * SHRIMP ? LIPITOR  Past Medical History:    Reviewed history from 07/14/2007 and no changes required:       Allergic rhinitis       Colonic polyps, hx of       Coronary artery disease       GERD       Hyperlipidemia       Hypertension       Urinary incontinence- overactive bladder       5/09 lichen sclerosis (dx by gyn after abn pap)                     GYN--Dr Kincius  Past Surgical History:    Reviewed history from 07/11/2007 and no changes required:       Hysterectomy       Oophorectomy       LAD angioplasty (1995)       Colonoscopy- polyps (1995)       Cardiolite (08/2001)       Nasal biopsy       EGD- HH, esoph stricture- dilated (05/2005)       Colonoscopy- diverticulosis, polyp       Cataract extraction       5/09 CT of chest- granulomas in upper lobe of lung and liver    Family History:    Reviewed history from 03/14/2007 and no changes required:       Father: deceased- lung  cancer       Mother: CVA, alzheimer's       Siblings: 1 brother with HTN, 1 sister       son with esoph cancer  Social History:    Marital Status: Married (husband is fighting cancer)    Children: 2    Occupation: retired    exercises on stationary bike/walks    Never Smoked    Alcohol use-no    Review of Systems  General      Complains of fatigue.      Denies chills, fever, and loss of appetite.      occ flushed   Eyes      Complains of eye irritation and itching.      Denies red eye.  ENT  Complains of nasal congestion, postnasal drainage, and sinus pressure.      Denies sore throat.      ears are itchy  CV      Denies chest pain or discomfort and palpitations.  Resp      Complains of cough, pleuritic, and sputum productive.      Denies wheezing.  GI      Complains of nausea and vomiting.      Denies diarrhea.      vomited times one   Derm      Denies itching, lesion(s), and rash.  Psych      mood ok    Physical Exam  General:     Well-developed,well-nourished,in no acute distress; alert,appropriate and cooperative throughout examination Head:     normocephalic, atraumatic, and no abnormalities observed.  no sinus tenderness  Eyes:     vision grossly intact, pupils equal, pupils round, pupils reactive to light, and no injection.   Ears:     R ear normal and L ear normal.   Nose:     nares are injected/boggy/slt congested  Mouth:     pharynx pink and moist, no erythema, and no exudates.   Neck:     No deformities, masses, or tenderness noted. Chest Wall:     No deformities, masses, or tenderness noted. Lungs:     harsh bs at bases with no wheeze or prol exp phase no rales or rhonchi  good air exch Heart:     Normal rate and regular rhythm. S1 and S2 normal without gallop, murmur, click, rub or other extra sounds. Abdomen:     soft and non-tender.   Skin:     Intact without suspicious lesions or rashes Cervical Nodes:     No  lymphadenopathy noted Psych:     normal affect, talkative and pleasant     Impression & Recommendations:  Problem # 1:  BRONCHITIS-ACUTE (ICD-466.0) Assessment: New (husband is sick with the same) will tx with zpak and guifen/codine cough syrup- which has worked well in past sympt care- fluids and nasal saline update if worse or any sob  The following medications were removed from the medication list:    Cipro 250 Mg Tabs (Ciprofloxacin hcl) .Marland Kitchen... 1 by mouth two times a day for 5 days for bladder infection  Her updated medication list for this problem includes:    Guaifenesin-codeine 100-10 Mg/17ml Liqd (Guaifenesin-codeine) .Marland Kitchen... 1-2 tsp by mouth q 6 hours as needed severe cough    Zithromax Z-pak 250 Mg Tabs (Azithromycin) .Marland Kitchen... Take by mouth as directed   Complete Medication List: 1)  Omeprazole 20 Mg Tbec (Omeprazole) .Marland Kitchen.. 1 by mouth qam 2)  Diltiazem Hcl Coated Beads 300 Mg Cp24 (Diltiazem hcl coated beads) .... Take one by mouth once a day 3)  Lisinopril 40 Mg Tabs (Lisinopril) .... Take 1 tablet by mouth once a day 4)  Omnipred 1 % Susp (Prednisolone acetate) .... Use one gtt ou daily 5)  Zocor 20 Mg Tabs (Simvastatin) .... Take one by mouth once a day 6)  Guaifenesin-codeine 100-10 Mg/63ml Liqd (Guaifenesin-codeine) .Marland Kitchen.. 1-2 tsp by mouth q 6 hours as needed severe cough 7)  Vesicare 5 Mg Tabs (Solifenacin succinate) .Marland Kitchen.. 1 by mouth once daily 8)  Clobetasol Propionate 0.05 % Oint (Clobetasol propionate) .... Daily 9)  Zithromax Z-pak 250 Mg Tabs (Azithromycin) .... Take by mouth as directed   Patient Instructions: 1)  take the zithromax as directed  2)  you can use  saline nasal spray for congestion 3)  use the cough syrup with caution- can make you sleepy 4)  drink lots of fluids  5)  update me if worse cough or fever or if not improving in 1 week    Prescriptions: GUAIFENESIN-CODEINE 100-10 MG/5ML  LIQD (GUAIFENESIN-CODEINE) 1-2 tsp by mouth q 6 hours as needed  severe cough  #120 cc x 0   Entered and Authorized by:   Judith Part MD   Signed by:   Judith Part MD on 10/08/2007   Method used:   Print then Give to Patient   RxID:   [id number] ZITHROMAX Z-PAK 250 MG  TABS (AZITHROMYCIN) take by mouth as directed  #1 pk x 0   Entered and Authorized by:   Judith Part MD   Signed by:   Judith Part MD on 10/08/2007   Method used:   Print then Give to Patient   RxID:   [id number]  ]

## 2010-04-01 NOTE — Progress Notes (Signed)
Summary: nuc pre procedure  Phone Note Outgoing Call Call back at Home Phone [phone]   Call placed by: Cathlyn Parsons RN,  December 15, 2009 4:52 PM Call placed to: Patient Reason for Call: Confirm/change Appt Summary of Call: Reviewed information on [employer] Information Sheet (see scanned document for further details).  Spoke with patient.      Nuclear Med Background Indications for Stress Test: Evaluation for Ischemia, PTCA Patency   History: Angioplasty, Heart Catheterization, Myocardial Perfusion Study  History Comments: 03 MPS nl S/P Ablation for SVT on 08/11  Symptoms: DOE, SOB    Nuclear Pre-Procedure Cardiac Risk Factors: Hypertension, Lipids, NIDDM Height (in): 60

## 2010-04-01 NOTE — Letter (Signed)
Summary: [facility]  [facility]   Imported By: Lanelle Bal 12/07/2009 10:10:15  _____________________________________________________________________  External Attachment:    Type:   Image     Comment:   External Document

## 2010-04-01 NOTE — Assessment & Plan Note (Signed)
Summary: SORE FINGER   Vital Signs:  Patient profile:   70 year old female Weight:      147 pounds Temp:     97.7 degrees F oral Pulse rate:   84 / minute Pulse rhythm:   regular BP sitting:   108 / 70  (left arm) Cuff size:   regular  Vitals Entered By: Lowella Petties CMA (March 03, 2009 4:48 PM) CC: Check left first finger   History of Present Illness: has swollen hot L index finger (was a brand new lancet)  checked her sugar sunday as usual  today - tight and swelled up   she tried to poke and express it -- and it bled but no pus   no fever/ feels just fine   she is allergic to tetnus shot - cannot have them    Allergies: 1)  ! * Tetanus 2)  ! Hydrocodone 3)  ! * Tessalon Pearles 4)  ! * Shrimp ? 5)  ! Asa 6)  Lipitor  Past History:  Past Medical History: Last updated: 06/02/2008 Allergic rhinitis Colonic polyps, hx of Coronary artery disease GERD Hyperlipidemia Hypertension Urinary incontinence- overactive bladder 5/09 lichen sclerosis (dx by gyn after abn pap) Cicatricial Pemphigoid, autoimmune d/o, affecting eyes, mouth, throat now (Dx [facility]) lichen sclerosis - vulvar    GYN--Dr Kincius Opthalmology--Dr. Dingledein urol- [facility] - Dr Trisha Mangle [facility])- for pemphigoid   Past Surgical History: Last updated: 08/28/2008 Hysterectomy Oophorectomy LAD angioplasty (1995) Colonoscopy- polyps (1995) Cardiolite (08/2001) Nasal biopsy EGD- HH, esoph stricture- dilated (05/2005) Colonoscopy- diverticulosis, polyp Cataract extraction 5/09 CT of chest- granulomas in upper lobe of lung and liver  5/10 dexa normal 6/10 colonoscopy- polyp  Family History: Last updated: 12-27-08 Father: deceased- lung cancer Mother: CVA, alzheimer's Siblings: 1 brother with HTN, 1 sister son with esoph cancer-- passed away   Social History: Last updated: 10/08/2007 Marital Status: Married (husband is fighting cancer) Children: 2 Occupation: retired exercises  on stationary bike/walks Never Smoked Alcohol use-no  Risk Factors: Smoking Status: never (05/31/2007)  Review of Systems General:  Denies chills, fatigue, fever, loss of appetite, and malaise. CV:  Denies chest pain or discomfort and palpitations. Resp:  Denies cough and wheezing. GI:  Denies abdominal pain. MS:  Complains of joint pain, joint redness, and joint swelling; denies cramps and muscle weakness. Derm:  Denies itching and rash. Neuro:  Denies numbness, tingling, and weakness. Psych:  mood ok . Heme:  Denies abnormal bruising and bleeding.  Physical Exam  General:  overweight but generally well appearing - moon facies  Head:  normocephalic, atraumatic, and no abnormalities observed.  bilat ethmoid and maxillary sinus tenderness  Neck:  supple with full rom and no masses or thyromegally, no JVD or carotid bruit  Lungs:  Normal respiratory effort, chest expands symmetrically. Lungs are clear to auscultation, no crackles or wheezes. Heart:  Normal rate and regular rhythm. S1 and S2 normal without gallop, murmur, click, rub or other extra sounds. Msk:  No deformity or scoliosis noted of thoracic or lumbar spine.  see skin exam for finger Extremities:  No clubbing, cyanosis, edema, or deformity noted with normal full range of motion of all joints.   Neurologic:  strength normal in all extremities and sensation intact to light touch.   Skin:  L index finger -- distal phalynx erythematous and warm and mildly swollen  scab from prev gluc test intact no pus or drainage nl perfusion and sensation Cervical Nodes:  No lymphadenopathy  noted Psych:  normal affect, talkative and pleasant    Impression & Recommendations:  Problem # 1:  CELLULITIS, FINGER (ICD-681.00) Assessment New with redness and swelling of L index finger - surrouding blood glucose checking site  nail and cuticles intact  will cover with keflex three times a day  f/u later this week  adv if inc in red/  swelling/pain or any fever - call asap Her updated medication list for this problem includes:    Augmentin 875-125 Mg Tabs (Amoxicillin-pot clavulanate) .Marland Kitchen... 1 by mouth two times a day for 10 days for sinus infection    Keflex 500 Mg Caps (Cephalexin) .Marland Kitchen... 1 by mouth three times a day with food for 10 days  Complete Medication List: 1)  Omeprazole 20 Mg Tbec (Omeprazole) .... Take 1 tablet by mouth every morning 2)  Diltiazem Hcl Coated Beads 300 Mg Cp24 (Diltiazem hcl coated beads) .... Take one by mouth once a day 3)  Lisinopril 40 Mg Tabs (Lisinopril) .... Take 1 tablet by mouth once a day 4)  Zocor 20 Mg Tabs (Simvastatin) .Marland Kitchen.. 1 by mouth once daily 5)  Prednisone 10 Mg Tabs (Prednisone) .... Take 30 mg's daily 6)  Omnipred 1 % Susp (Prednisolone acetate) .... One drop in each eye two times a day 7)  Timolol Maleate 0.25 % Soln (Timolol maleate) .... One drop in each eye once daily 8)  Metformin Hcl 500 Mg Tabs (Metformin hcl) .... 1/2 tab by mouth two times a day 9)  Azathioprine 50 Mg Tabs (Azathioprine) .... Take one and a half  by mouth daily 10)  Glucometer  .... To check sugar once daily and as needed for diabetes 250.0 11)  Glucose Test Strips  .... To check sugar once daily and as needed for diabetes 250.0 12)  Augmentin 875-125 Mg Tabs (Amoxicillin-pot clavulanate) .Marland Kitchen.. 1 by mouth two times a day for 10 days for sinus infection 13)  Ambien 10 Mg Tabs (Zolpidem tartrate) .... 1/2 to 1 by mouth at bedtime as needed insomnia 14)  Toviaz 8 Mg Xr24h-tab (Fesoterodine fumarate) .... Take one by mouth daily 15)  Keflex 500 Mg Caps (Cephalexin) .Marland Kitchen.. 1 by mouth three times a day with food for 10 days  Patient Instructions: 1)  keep finger clean and dry - use rubbing alcohol or antibacterial soap and water  2)  try not to submerge it  3)  take keflex as directed three times a day with food  4)  you can try a warm compress on finger 5)  if increase in redness/ pain / swelling or any  streaking or fever -- let me know asap or go to ER  6)  follow up with me thursday or friday for a re check Prescriptions: KEFLEX 500 MG CAPS (CEPHALEXIN) 1 by mouth three times a day with food for 10 days  #30 x 0   Entered and Authorized by:   Judith Part MD   Signed by:   Judith Part MD on 03/03/2009   Method used:   Print then Give to Patient   RxID:   [id number]   Prior Medications (reviewed today): OMEPRAZOLE 20 MG  TBEC (OMEPRAZOLE) Take 1 tablet by mouth every morning DILTIAZEM HCL COATED BEADS 300 MG CP24 (DILTIAZEM HCL COATED BEADS) Take one by mouth once a day LISINOPRIL 40 MG TABS (LISINOPRIL) Take 1 tablet by mouth once a day ZOCOR 20 MG TABS (SIMVASTATIN) 1 by mouth once daily PREDNISONE 10 MG TABS (PREDNISONE)  take 30 mg's daily OMNIPRED 1 % SUSP (PREDNISOLONE ACETATE) One drop in each eye two times a day TIMOLOL MALEATE 0.25 % SOLN (TIMOLOL MALEATE) One drop in each eye once daily METFORMIN HCL 500 MG TABS (METFORMIN HCL) 1/2 tab by mouth two times a day AZATHIOPRINE 50 MG TABS (AZATHIOPRINE) take one and a half  by mouth daily GLUCOMETER () to check sugar once daily and as needed for diabetes 250.0 GLUCOSE TEST STRIPS () to check sugar once daily and as needed for diabetes 250.0 AUGMENTIN 875-125 MG TABS (AMOXICILLIN-POT CLAVULANATE) 1 by mouth two times a day for 10 days for sinus infection AMBIEN 10 MG TABS (ZOLPIDEM TARTRATE) 1/2 to 1 by mouth at bedtime as needed insomnia TOVIAZ 8 MG XR24H-TAB (FESOTERODINE FUMARATE) Take one by mouth daily Current Allergies: ! * TETANUS ! HYDROCODONE ! * TESSALON PEARLES ! * SHRIMP ? ! ASA LIPITOR

## 2010-04-01 NOTE — Assessment & Plan Note (Signed)
Summary: CHECK FOR DIABETES/CLE   Vital Signs:  Patient Profile:   70 Years Old Female Height:     60.25 inches (153.04 cm) Weight:      147.13 pounds Temp:     97.9 degrees F oral Pulse rate:   80 / minute Pulse rhythm:   regular BP sitting:   130 / 76  (left arm) Cuff size:   regular  Vitals Entered By: Delilah Shan (April 28, 2008 8:23 AM)                 Chief Complaint:  Check for diabetes.  History of Present Illness: Taking chronic prednisione (30mg /20mg  alternate) for phemphigoid. On since 11/2007.Tapering off now slowly.  No N/V, drinking liquids.  Labs  3 hours after eating blood sugar was elevated at 200.    in last 2 weeks, increased urinary urgency. Increased thirst, fatigue.     Current Allergies (reviewed today): ! * TETANUS ! HYDROCODONE ! * TESSALON PEARLES ! * SHRIMP ? LIPITOR  Past Medical History:    Reviewed history from 03/14/2008 and no changes required:       Allergic rhinitis       Colonic polyps, hx of       Coronary artery disease       GERD       Hyperlipidemia       Hypertension       Urinary incontinence- overactive bladder       5/09 lichen sclerosis (dx by gyn after abn pap)       Cicatricial Pemphigoid, autoimmune d/o, affecting eyes, mouth, throat now (Dx [facility])                     GYN--Dr Kincius       Opthalmology--Dr. Dingledein       urol- [facility]       - Dr Trisha Mangle [facility])- for pemphigoid    Family History:    Reviewed history from 03/14/2007 and no changes required:       Father: deceased- lung cancer       Mother: CVA, alzheimer's       Siblings: 1 brother with HTN, 1 sister       son with esoph cancer  Social History:    Reviewed history from 10/08/2007 and no changes required:       Marital Status: Married (husband is fighting cancer)       Children: 2       Occupation: retired       exercises on stationary bike/walks       Never Smoked       Alcohol use-no     Physical Exam  General:     moon  facies, NAD Mouth:     MMM Lungs:     Normal respiratory effort, chest expands symmetrically. Lungs are clear to auscultation, no crackles or wheezes. Heart:     Normal rate and regular rhythm. S1 and S2 normal without gallop, murmur, click, rub or other extra sounds. Abdomen:     Bowel sounds positive,abdomen soft and non-tender without masses, organomegaly or hernias noted. Pulses:     R and L carotid,radial,femoral,dorsalis pedis and posterior tibial pulses are full and equal bilaterally Extremities:     No clubbing, cyanosis, edema, or deformity noted with normal full range of motion of all joints.      Impression & Recommendations:  Problem # 1:  DIABETES MELLITUS, UNCONTROLLED (ICD-250.02) Given elevated blood  sugar3 hours after eating, new diagnosis DM. Will check fasting today as well as A1C. Check microalbumin. HAs appt in 1 month  for CPE with Dr. Milinda Antis, may need sooner, depending on control.  Her updated medication list for this problem includes:    Lisinopril 40 Mg Tabs (Lisinopril) .Marland Kitchen... Take 1 tablet by mouth once a day  Orders: TLB-BMP (Basic Metabolic Panel-BMET) (80048-METABOL) TLB-A1C / Hgb A1C (Glycohemoglobin) (83036-A1C) TLB-Microalbumin/Creat Ratio, Urine (82043-MALB)   Complete Medication List: 1)  Omeprazole 20 Mg Tbec (Omeprazole) .... Take 1 tablet by mouth every morning 2)  Diltiazem Hcl Coated Beads 300 Mg Cp24 (Diltiazem hcl coated beads) .... Take one by mouth once a day 3)  Lisinopril 40 Mg Tabs (Lisinopril) .... Take 1 tablet by mouth once a day 4)  Zocor 20 Mg Tabs (Simvastatin) .... Take one by mouth once a day 5)  Folic Acid 1 Mg Tabs (Folic acid) .... Take 1 tablet by mouth two times a day 6)  Methotrexate 2.5 Mg Tabs (Methotrexate sodium) .... Takes 5 tablets per day 7)  Prednisone 10 Mg Tabs (Prednisone) .... Thre tabs by mouth once daily 8)  Imipramine Hcl 25 Mg Tabs (Imipramine hcl) .... Take 1 tablet by mouth once a day every evening 9)   Omnipred 1 % Susp (Prednisolone acetate) .... One drop in each eye two times a day 10)  Timolol Maleate 0.25 % Soln (Timolol maleate) .... One drop in each eye once daily     Current Allergies (reviewed today): ! * TETANUS ! HYDROCODONE ! * TESSALON PEARLES ! * SHRIMP ? LIPITOR Current Medications (including changes made in today's visit):  OMEPRAZOLE 20 MG  TBEC (OMEPRAZOLE) Take 1 tablet by mouth every morning DILTIAZEM HCL COATED BEADS 300 MG CP24 (DILTIAZEM HCL COATED BEADS) Take one by mouth once a day LISINOPRIL 40 MG TABS (LISINOPRIL) Take 1 tablet by mouth once a day ZOCOR 20 MG  TABS (SIMVASTATIN) Take one by mouth once a day FOLIC ACID 1 MG TABS (FOLIC ACID) Take 1 tablet by mouth two times a day METHOTREXATE 2.5 MG TABS (METHOTREXATE SODIUM) Takes 5 tablets per day PREDNISONE 10 MG TABS (PREDNISONE) thre tabs by mouth once daily IMIPRAMINE HCL 25 MG TABS (IMIPRAMINE HCL) Take 1 tablet by mouth once a day every evening OMNIPRED 1 % SUSP (PREDNISOLONE ACETATE) One drop in each eye two times a day TIMOLOL MALEATE 0.25 % SOLN (TIMOLOL MALEATE) One drop in each eye once daily

## 2010-04-01 NOTE — Progress Notes (Signed)
  Phone Note From Other Clinic   Summary of Call: call from Dr Alvin Critchley -- [name] derm with update on pt her oral pemphigoid is progressing and now she has mild conjunctival involvement  this can later threaten vision  has failed imuran and requiring larger doses of prednisone next step is cyclophosamide --- but cannot start it until she has had her elevated lfts worked up  ? hx of hepatitis in 1970s  needs appt with her primary to disc her liver -- do lab work up (?[place] or ref if needed) also watch DM on the prednisone pt will call for appt  Follow-up for Phone Call        pt will call for appt   (looks like dec 13) will work up elevated transaminases and disc DM will look foward to records for review from [facility] as well Follow-up by: Judith Part MD,  January 27, 2010 5:58 PM

## 2010-04-01 NOTE — Consult Note (Signed)
Summary: [facility]/Dr. Achilles Dunk  [facility]/Dr. Achilles Dunk   Imported By: Eleonore Chiquito 11/26/2007 14:08:40  _____________________________________________________________________  External Attachment:    Type:   Image     Comment:   External Document

## 2010-04-01 NOTE — Assessment & Plan Note (Signed)
Summary: Victoria Allison   Visit Type:  Follow-up Primary Victoria Allison:  Victoria Part MD   History of Present Illness: Victoria Allison returns today for evaluation of Victoria.  She has a h/o DM and HTN.  She also has dyslipidemia.  She notes that her initial episode of Victoria began 15 yrs ago which was terminated with adenosine.  Another episode occurred 12 yrs ago.  She was well until 2 months ago when she had recurrent Victoria which again required adenosine for termination.  One month ago she had another spell.  She has been on calcium channel blockers and digoxin.  She would like to consider catheter ablation.  She notes that when she went into Victoria, she feels weakness, sob and chest pressure.  Current Medications (verified): 1)  Omeprazole 20 Mg  Tbec (Omeprazole) .... Take 1 Tablet By Mouth Every Morning 2)  Diltiazem Hcl Cr 120 Mg Xr12h-Cap (Diltiazem Hcl) .Marland Kitchen.. 1 By Mouth Once Daily in Am 3)  Lisinopril 20 Mg Tabs (Lisinopril) .... Take One Tablet By Mouth Daily 4)  Crestor 10 Mg Tabs (Rosuvastatin Calcium) .Marland Kitchen.. 1 Once Daily 5)  Prednisone 10 Mg Tabs (Prednisone) .... 5 Mg Once Daily 6)  Omnipred 1 % Susp (Prednisolone Acetate) .... One Drop in Each Eye Two Times A Day 7)  Timolol Maleate 0.25 % Soln (Timolol Maleate) .... One Drop in Each Eye Once Daily 8)  Metformin Hcl 500 Mg Tabs (Metformin Hcl) .... 1/2 Tab By Mouth Two Times A Day 9)  Azathioprine 50 Mg Tabs (Azathioprine) .... Take 2 Tablets Daily 10)  Glucometer .... To Check Sugar Once Daily and As Needed For Diabetes 250.0 11)  Glucose Test Strips .... To Check Sugar Once Daily and As Needed For Diabetes 250.0 12)  Ambien 10 Mg Tabs (Zolpidem Tartrate) .... 1/2 To 1 By Mouth At Bedtime As Needed Insomnia 13)  Toviaz 8 Mg Xr24h-Tab (Fesoterodine Fumarate) .... Take One By Mouth Daily 14)  Digoxin 0.25 Mg/ml Soln (Digoxin) .Marland Kitchen.. 1 Once Daily  Allergies: 1)  ! * Tetanus 2)  ! Hydrocodone 3)  ! * Tessalon Pearles 4)  ! * Shrimp ? 5)  !  Asa 6)  Lipitor  Past History:  Past Medical History: Last updated: 09/22/2009 Allergic rhinitis Colonic polyps, hx of Coronary artery disease GERD Hyperlipidemia Hypertension Urinary incontinence- overactive bladder 5/09 lichen sclerosis (dx by gyn after abn pap) Cicatricial Pemphigoid, autoimmune d/o, affecting eyes, mouth, throat now (Dx [facility]) lichen sclerosis - vulvar  GYN--Dr Kincius Opthalmology--Dr. Dingledein urol- [place] - Dr Trisha Mangle [facility])- for pemphigoid   Past Surgical History: Last updated: 08/28/2008 Hysterectomy Oophorectomy LAD angioplasty (1995) Colonoscopy- polyps (1995) Cardiolite (08/2001) Nasal biopsy EGD- HH, esoph stricture- dilated (05/2005) Colonoscopy- diverticulosis, polyp Cataract extraction 5/09 CT of chest- granulomas in upper lobe of lung and liver  5/10 dexa normal 6/10 colonoscopy- polyp  Family History: Last updated: December 27, 2008 Father: deceased- lung cancer Mother: CVA, alzheimer's Siblings: 1 brother with HTN, 1 sister son with esoph cancer-- passed away   Social History: Last updated: 10/08/2007 Marital Status: Married (husband is fighting cancer) Children: 2 Occupation: retired exercises on stationary bike/walks Never Smoked Alcohol use-no  Review of Systems       All systems reviewed and negative except as noted in the HPI.  Vital Signs:  Patient profile:   70 year old female Height:      60 inches Weight:      127 pounds BMI:     24.89 O2  Sat:      97 % Pulse rate:   87 / minute BP sitting:   120 / 70  (left arm)  Vitals Entered By: Laurance Flatten CMA (October 01, 2009 3:55 PM)  Physical Exam  General:  Well developed, well nourished, in no acute distress. Head:  normocephalic and atraumatic Eyes:  PERRLA/EOM intact; conjunctiva and lids normal. Mouth:  pharynx pink and moist.   Neck:  Neck supple, no JVD. No masses, thyromegaly or abnormal cervical nodes. Chest Wall:  no deformities or breast masses  noted Lungs:  Clear bilaterally to auscultation with no wheezes, rales, or rhonchi. Heart:  RRR with normal S1 and S2.  PMI is not enlarged or laterally displaced. Abdomen:  soft, non-tender, and normal bowel sounds.  no renal bruits  Msk:  Back normal, normal gait. Muscle strength and tone normal. Pulses:  pulses normal in all 4 extremities Extremities:  No clubbing or cyanosis. Neurologic:  Alert and oriented x 3.   EKG  Procedure date:  09/24/2009  Findings:      Normal sinus rhythm with rate of: 83. Non-specific ST-T wave changes noted.    Impression & Recommendations:  Problem # 1:  SUPRAVENTRICULAR TACHYCARDIA (ICD-427.89) She has had persistent symptoms despite medical therapy.  I have discussed the treatment options and the risks/benefits/goals/expectations of EPS/RFA of Victoria and she would like to proceed.   This will be scheduled at the earliest possible date. Her updated medication list for this problem includes:    Diltiazem Hcl Cr 120 Mg Xr12h-cap (Diltiazem hcl) .Marland Kitchen... 1 by mouth once daily in am    Lisinopril 20 Mg Tabs (Lisinopril) .Marland Kitchen... Take one tablet by mouth daily  Orders: TLB-BMP (Basic Metabolic Panel-BMET) (80048-METABOL) TLB-CBC Platelet - w/Differential (85025-CBCD) TLB-PT (Protime) (85610-PTP) TLB-PTT (85730-PTTL)  Problem # 2:  HYPERTENSION (ICD-401.9) Her blood pressure is well controlled.  I have asked her to continue her medical therapy. Her updated medication list for this problem includes:    Diltiazem Hcl Cr 120 Mg Xr12h-cap (Diltiazem hcl) .Marland Kitchen... 1 by mouth once daily in am    Lisinopril 20 Mg Tabs (Lisinopril) .Marland Kitchen... Take one tablet by mouth daily  Orders: TLB-BMP (Basic Metabolic Panel-BMET) (80048-METABOL) TLB-CBC Platelet - w/Differential (85025-CBCD) TLB-PT (Protime) (85610-PTP) TLB-PTT (85730-PTTL)  Patient Instructions: 1)  Your physician has recommended that you have an ablation.  Catheter ablation is a medical procedure used to treat  some cardiac arrhythmias (irregular heartbeats). During catheter ablation, a long, thin, flexible tube is put into a blood vessel in your groin (upper thigh), or neck. This tube is called an ablation catheter. It is then guided to your heart through the blood vessel. Radiofrequency waves destroy small areas of heart tissue where abnormal heartbeats may cause an arrhythmia to start.  Please see the instruction sheet given to you today.

## 2010-04-01 NOTE — Letter (Signed)
Summary: [facility]  [facility]   Imported By: Maryln Gottron 02/25/2009 15:55:57  _____________________________________________________________________  External Attachment:    Type:   Image     Comment:   External Document

## 2010-04-01 NOTE — Assessment & Plan Note (Signed)
Summary: CPX   Vital Signs:  Patient Profile:   70 Years Old Female Height:     60.25 inches Weight:      141 pounds Temp:     97.8 degrees F oral Pulse rate:   68 / minute Pulse rhythm:   regular BP sitting:   130 / 72  (left arm) Cuff size:   regular  Vitals Entered By: Lowella Petties (May 31, 2007 11:09 AM)                 Chief Complaint:  30 minute check up.  History of Present Illness: still getting tickle in throat and cough  ? if side eff to lisinopril- wants to stop it   son had esoph ca sx- doing well  has been all scratched up from family's new puppy using neosporin  colonoscopy for polyps is due next april   has already had shingles does not take flu shot or pneumovax  has never had dexa - does not want one   is having urination problems, has always had frequency and now looses continence before she can get to bathroom  ditropan is not helping  (even leaked on vesicare) a little burning  has also had itching in vulvar area for a year     Current Allergies: ! * TETANUS ! HYDROCODONE ! * TESSALON PEARLES LIPITOR  Past Medical History:    Reviewed history from 03/14/2007 and no changes required:       Allergic rhinitis       Colonic polyps, hx of       Coronary artery disease       GERD       Hyperlipidemia       Hypertension       Urinary incontinence- overactive bladder  Past Surgical History:    Reviewed history from 03/13/2007 and no changes required:       Hysterectomy       Oophorectomy       LAD angioplasty (1995)       Colonoscopy- polyps (1995)       Cardiolite (08/2001)       Nasal biopsy       EGD- HH, esoph stricture- dilated (05/2005)       Colonoscopy- diverticulosis, polyp       Cataract extraction   Social History:    Marital Status: Married    Children: 2    Occupation: retired    exercises on stationary bike/walks    Never Smoked    Alcohol use-no   Risk Factors:  Tobacco use:  never Alcohol use:   no  Colonoscopy History:     Date of Last Colonoscopy:  06/21/2005    Results:  Adenomatous Polyp    Review of Systems  General      Denies malaise and weight loss.  Eyes      Denies blurring.  CV      Denies chest pain or discomfort and palpitations.  Resp      Complains of cough.      Denies shortness of breath, sputum productive, and wheezing.  GI      Denies bloody stools and change in bowel habits.  GU      Complains of incontinence and urinary frequency.      Denies dysuria and hematuria.  Derm      Denies rash.  Neuro      Denies numbness and tingling.  Endo  Denies excessive hunger and excessive thirst.   Physical Exam  General:     Well-developed,well-nourished,in no acute distress; alert,appropriate and cooperative throughout examination Head:     normocephalic, atraumatic, and no abnormalities observed.   Eyes:     vision grossly intact, pupils equal, pupils round, and pupils reactive to light.   Ears:     R ear normal and L ear normal.   Nose:     no nasal discharge.   Mouth:     pharynx pink and moist.   Neck:     supple with full rom and no masses or thyromegally, no JVD or carotid bruit  Chest Wall:     No deformities, masses, or tenderness noted. Breasts:     No mass, nodules, thickening, tenderness, bulging, retraction, inflamation, nipple discharge or skin changes noted.   Lungs:     Normal respiratory effort, chest expands symmetrically. Lungs are clear to auscultation, no crackles or wheezes. Heart:     Normal rate and regular rhythm. S1 and S2 normal without gallop, murmur, click, rub or other extra sounds. Abdomen:     Bowel sounds positive,abdomen soft and non-tender without masses, organomegaly or hernias noted. no suprapubic tenderness or fullness Rectal:     No external abnormalities noted. Normal sphincter tone. No rectal masses or tenderness.- heme neg stool  Genitalia:     some white discoloration on labia minora  bilat with slt hyperemia as well uterus is sx absentnormal introitus, no vaginal discharge, and mucosa pink and moist.   Msk:     No deformity or scoliosis noted of thoracic or lumbar spine.  no acute joint changes  no cva tenderness Pulses:     R and L carotid,radial,femoral,dorsalis pedis and posterior tibial pulses are full and equal bilaterally Extremities:     No clubbing, cyanosis, edema, or deformity noted with normal full range of motion of all joints.   Neurologic:     sensation intact to pinprick, gait normal, and DTRs symmetrical and normal.   Skin:     Intact without suspicious lesions or rashes some lentigos diffusely Cervical Nodes:     No lymphadenopathy noted Axillary Nodes:     No palpable lymphadenopathy Inguinal Nodes:     No significant adenopathy Psych:     normal affect, talkative and pleasant     Impression & Recommendations:  Problem # 1:  ROUTINE GYNECOLOGICAL EXAMINATION (ICD-V72.31) Assessment: New with some vulvar itching and discomfort on exam- some labia minora changes consistent with lichen sclerosis will tx with 10 days of diprolene cream and f/u for re check also incontinence issues Orders: Pap Smear (04540)   Problem # 2:  OTHER SCREENING MAMMOGRAM (ICD-V76.12) Assessment: Comment Only enc to do self exams  no abn on exam today schedule mam Orders: Mammogram (Screening) (Mammo)   Problem # 3:  HYPERTENSION (ICD-401.9) Assessment: Unchanged with likely ace cough will hold lisinopril and f/u in about 2 weeks  Her updated medication list for this problem includes:    Diltiazem Hcl Coated Beads 300 Mg Cp24 (Diltiazem hcl coated beads) .Marland Kitchen... Take one by mouth once a day    Lisinopril 40 Mg Tabs (Lisinopril) .Marland Kitchen... Take 1 tablet by mouth once a day  Orders: Venipuncture (98119) TLB-Lipid Panel (80061-LIPID) TLB-Renal Function Panel (80069-RENAL) TLB-CBC Platelet - w/Differential (85025-CBCD) TLB-Hepatic/Liver Function Pnl  (80076-HEPATIC) TLB-TSH (Thyroid Stimulating Hormone) (84443-TSH)  BP today: 130/72 Prior BP: 110/60 (03/14/2007)  Labs Reviewed: Creat: 0.7 (03/28/2006) Chol: 150 (03/28/2006)   HDL:  61.2 (03/28/2006)   LDL: 72 (03/28/2006)   TG: 85 (03/28/2006)   Problem # 4:  URINARY INCONTINENCE (ICD-788.30) Assessment: Deteriorated has failed several medications- with overactive bladder and urge incont sympt suspect will need to discuss urology to discuss further options UA nl today Orders: UA Dipstick W/ Micro (manual) (04540)   Problem # 5:  HYPERLIPIDEMIA (ICD-272.4) Assessment: Unchanged labs today on statin and diet Her updated medication list for this problem includes:    Zocor 20 Mg Tabs (Simvastatin) .Marland Kitchen... Take one by mouth once a day  Orders: Venipuncture (98119) TLB-Lipid Panel (80061-LIPID) TLB-Renal Function Panel (80069-RENAL) TLB-CBC Platelet - w/Differential (85025-CBCD) TLB-Hepatic/Liver Function Pnl (80076-HEPATIC) TLB-TSH (Thyroid Stimulating Hormone) (84443-TSH)  Labs Reviewed: Chol: 150 (03/28/2006)   HDL: 61.2 (03/28/2006)   LDL: 72 (03/28/2006)   TG: 85 (03/28/2006) SGOT: 35 (03/28/2006)   SGPT: 34 (03/28/2006)   Problem # 6:  COLONIC POLYPS, HX OF (ICD-V12.72) Assessment: Comment Only due for colonosc 2010 will watch for blood in stool or other symptoms  Complete Medication List: 1)  Omeprazole 20 Mg Tbec (Omeprazole) .Marland Kitchen.. 1 by mouth qam 2)  Ditropan 5 Mg Tabs (Oxybutynin chloride) .... Take one by mouth once a day 3)  Diltiazem Hcl Coated Beads 300 Mg Cp24 (Diltiazem hcl coated beads) .... Take one by mouth once a day 4)  Lisinopril 40 Mg Tabs (Lisinopril) .... Take 1 tablet by mouth once a day 5)  Omnipred 1 % Susp (Prednisolone acetate) .... Use one gtt ou daily 6)  Zocor 20 Mg Tabs (Simvastatin) .... Take one by mouth once a day 7)  Diprolene Af 0.05 % Crea (Aug betamethasone dipropionate) .... Apply to affected area once daily for 10  days   Patient Instructions: 1)  the current recommendation for calcium intake is 1200-1500 mg daily with [phone] IU of vitamin D 2)  this and regular exercise helps prevent osteoporosis 3)  we can order a bone density test any time you want one  4)  stop the lisinopril- it may be causing your cough  5)  we will schedule mammogram at check out 6)  start using diprolene cream on outer vulva - very small amount daily for about 10 days 7)  f/u in 2-3 weeks    Prescriptions: DIPROLENE AF 0.05 %  CREA (AUG BETAMETHASONE DIPROPIONATE) apply to affected area once daily for 10 days  #1 small x 0   Entered and Authorized by:   Judith Part MD   Signed by:   Judith Part MD on 05/31/2007   Method used:   Print then Give to Patient   RxID:   [id number]  ] Prior Medications (reviewed today): OMEPRAZOLE 20 MG  TBEC (OMEPRAZOLE) 1 by mouth qam DITROPAN 5 MG TABS (OXYBUTYNIN CHLORIDE) Take one by mouth once a day DILTIAZEM HCL COATED BEADS 300 MG CP24 (DILTIAZEM HCL COATED BEADS) Take one by mouth once a day LISINOPRIL 40 MG TABS (LISINOPRIL) Take 1 tablet by mouth once a day OMNIPRED 1 %  SUSP (PREDNISOLONE ACETATE) use one gtt ou daily ZOCOR 20 MG  TABS (SIMVASTATIN) Take one by mouth once a day DIPROLENE AF 0.05 %  CREA (AUG BETAMETHASONE DIPROPIONATE) apply to affected area once daily for 10 days Current Allergies: ! * TETANUS ! HYDROCODONE ! * Beckie Salts LIPITOR  Preventive Care Screening  Breast Exam:    Date:  05/31/2007    Results:  Normal  Colonoscopy:    Date:  06/21/2005    Next Due:  06/2008     Results:  Adenomatous Polyp     Laboratory Results   Urine Tests  Date/Time Recieved: May 31, 2007 11:49 AM  Date/Time Reported: May 31, 2007 11:49 AM   Routine Urinalysis   Color: yellow Appearance: Clear Glucose: negative   (Normal Range: Negative) Bilirubin: negative   (Normal Range: Negative) Ketone: trace (5)   (Normal Range:  Negative) Spec. Gravity: 1.010   (Normal Range: 1.003-1.035) Blood: negative   (Normal Range: Negative) pH: 6.5   (Normal Range: 5.0-8.0) Protein: trace   (Normal Range: Negative) Urobilinogen: 0.2   (Normal Range: 0-1) Nitrite: negative   (Normal Range: Negative) Leukocyte Esterace: small   (Normal Range: Negative)  Urine Microscopic WBC/hpf: 0 RBC/hpf: 0 Bacteria: 0 Mucous: few Epithelial: 1-3 Crystals/LPF: 0 Casts/LPF: 0 Yeast/HPF: 0 Other: 0

## 2010-04-01 NOTE — Consult Note (Signed)
Summary: Dr.Dingeldein,[facility],Note  Dr.Dingeldein,[facility],Note   Imported By: Beau Fanny 11/15/2007 16:49:24  _____________________________________________________________________  External Attachment:    Type:   Image     Comment:   External Document

## 2010-04-01 NOTE — Procedures (Signed)
Summary: Colonoscopy/[facility]  [facility]   Imported By: Lanelle Bal 08/28/2008 11:47:44  _____________________________________________________________________  External Attachment:    Type:   Image     Comment:   External Document  Appended Document: Colonoscopy/[facility]    Clinical Lists Changes  Observations: Added new observation of PAST SURG HX: Hysterectomy Oophorectomy LAD angioplasty (1995) Colonoscopy- polyps (1995) Cardiolite (08/2001) Nasal biopsy EGD- HH, esoph stricture- dilated (05/2005) Colonoscopy- diverticulosis, polyp Cataract extraction 5/09 CT of chest- granulomas in upper lobe of lung and liver  5/10 dexa normal 6/10 colonoscopy- polyp  (08/28/2008 21:12) Added new observation of COLONOSCOPY: Adenomatous Polyp (08/19/2008 21:13)       Past Surgical History:    Hysterectomy    Oophorectomy    LAD angioplasty (1995)    Colonoscopy- polyps (1995)    Cardiolite (08/2001)    Nasal biopsy    EGD- HH, esoph stricture- dilated (05/2005)    Colonoscopy- diverticulosis, polyp    Cataract extraction    5/09 CT of chest- granulomas in upper lobe of lung and liver     5/10 dexa normal    6/10 colonoscopy- polyp    Preventive Care Screening  Colonoscopy:    Date:  08/19/2008    Results:  Adenomatous Polyp

## 2010-04-01 NOTE — Letter (Signed)
Summary: Generic Letter  [place] [facility]  [address] [place], [place] [address]   Phone: [phone]  Fax: [phone]    09/03/2007  Victoria Allison [address] [place], [place]  [address]  To whom it may concern,   Victoria Allison is my patient.  She suffers from chronic hearing loss and overactive bladder.  These conditions make her unable to perform jury duty.  Please call if questions.    Sincerely,   Roxy Manns MD  at [facility]  Appended Document: Generic Letter Pt notified that letter is ready for pickup.

## 2010-04-01 NOTE — Miscellaneous (Signed)
Summary: med list update  Clinical Lists Changes  Medications: Changed medication from ZOCOR 20 MG  TABS (SIMVASTATIN) Take one by mouth once a day to ZOCOR 40 MG TABS (SIMVASTATIN) take one by mouth daily     Prior Medications: OMEPRAZOLE 20 MG  TBEC (OMEPRAZOLE) Take 1 tablet by mouth every morning DILTIAZEM HCL COATED BEADS 300 MG CP24 (DILTIAZEM HCL COATED BEADS) Take one by mouth once a day LISINOPRIL 40 MG TABS (LISINOPRIL) Take 1 tablet by mouth once a day FOLIC ACID 1 MG TABS (FOLIC ACID) Take 1 tablet by mouth two times a day PREDNISONE 10 MG TABS (PREDNISONE) take 30 mg's daily IMIPRAMINE HCL 25 MG TABS (IMIPRAMINE HCL) Take 2 tabletsby mouth once a day every evening OMNIPRED 1 % SUSP (PREDNISOLONE ACETATE) One drop in each eye two times a day TIMOLOL MALEATE 0.25 % SOLN (TIMOLOL MALEATE) One drop in each eye once daily METFORMIN HCL 500 MG TABS (METFORMIN HCL) 1/2 tab by mouth two times a day AZATHIOPRINE 50 MG TABS (AZATHIOPRINE) take one by mouth daily Current Allergies: ! * TETANUS ! HYDROCODONE ! * TESSALON PEARLES ! * SHRIMP ? ! ASA LIPITOR

## 2010-04-01 NOTE — Assessment & Plan Note (Signed)
Summary: 6 WEEK FOLLOW UP/[facility]   Vital Signs:  Patient profile:   70 year old female Height:      60 inches Weight:      137.2 pounds BMI:     26.89 Temp:     97.7 degrees F oral Pulse rate:   80 / minute Pulse rhythm:   regular BP sitting:   110 / 70  (left arm) Cuff size:   regular  Vitals Entered By: Benny Lennert CMA Duncan Dull) (Jul 02, 2009 7:59 AM)  History of Present Illness: Chief complaint 6 week follow up shoulder  very pleasant lady, f/u with R shoulder subAC bursitis. Completely better  s/p subAC injection  Did not go to PT.  REVIEW OF SYSTEMS  GEN: No systemic complaints, no fevers, chills, sweats, or other acute illnesses MSK: Detailed in the HPI GI: tolerating PO intake without difficulty Neuro: No numbness, parasthesias, or tingling associated. Otherwise the pertinent positives of the ROS are noted above.    GEN: Well-developed,well-nourished,in no acute distress; alert,appropriate and cooperative throughout examination HEENT: Normocephalic and atraumatic without obvious abnormalities. No apparent alopecia or balding. Ears, externally no deformities PULM: Breathing comfortably in no respiratory distress EXT: No clubbing, cyanosis, or edema PSYCH: Normally interactive. Cooperative during the interview. Pleasant. Friendly and conversant. Not anxious or depressed appearing. Normal, full affect.   Shoulder: R Inspection: No muscle wasting or winging Ecchymosis/edema: neg  AC joint, scapula, clavicle: NT Cervical spine: NT, full ROM Spurling's: neg Abduction: full, 5/5 Flexion: full, 5/5 IR, full, lift-off: 5/5 ER at neutral: full, 5/5 AC crossover and compression: neg Neer: neg Hawkins: neg Drop Test: neg Empty Can: neg Supraspinatus insertion: NT Bicipital groove: NT Speed's: neg Yergason's: neg Sulcus sign: neg Scapular dyskinesis: none C5-T1 intact Sensation intact Grip 5/5   Allergies: 1)  ! * Tetanus 2)  ! Hydrocodone 3)  ! * Tessalon  Pearles 4)  ! * Shrimp ? 5)  ! Asa 6)  Lipitor   Impression & Recommendations:  Problem # 1:  SHOULDER IMPINGEMENT SYNDROME, RIGHT (ICD-726.2) Assessment Improved better  I reviewed with the patient a handout from [employer] and [facility] for RTC str and scapular stabilization.   Complete Medication List: 1)  Omeprazole 20 Mg Tbec (Omeprazole) .... Take 1 tablet by mouth every morning 2)  Diltiazem Hcl Cr 120 Mg Xr12h-cap (Diltiazem hcl) .Marland Kitchen.. 1 by mouth once daily in am 3)  Lisinopril 40 Mg Tabs (Lisinopril) .... Take 1 tablet by mouth once a day 4)  Zocor 20 Mg Tabs (Simvastatin) .Marland Kitchen.. 1 by mouth once daily 5)  Prednisone 10 Mg Tabs (Prednisone) .... 5 mg every other day 6)  Omnipred 1 % Susp (Prednisolone acetate) .... One drop in each eye two times a day 7)  Timolol Maleate 0.25 % Soln (Timolol maleate) .... One drop in each eye once daily 8)  Metformin Hcl 500 Mg Tabs (Metformin hcl) .... 1/2 tab by mouth two times a day 9)  Azathioprine 50 Mg Tabs (Azathioprine) .... Take 2 tablets daily 10)  Glucometer  .... To check sugar once daily and as needed for diabetes 250.0 11)  Glucose Test Strips  .... To check sugar once daily and as needed for diabetes 250.0 12)  Ambien 10 Mg Tabs (Zolpidem tartrate) .... 1/2 to 1 by mouth at bedtime as needed insomnia 13)  Toviaz 8 Mg Xr24h-tab (Fesoterodine fumarate) .... Take one by mouth daily  Current Allergies (reviewed today): ! * TETANUS ! HYDROCODONE ! *  TESSALON PEARLES ! * SHRIMP ? ! ASA LIPITOR

## 2010-04-01 NOTE — Assessment & Plan Note (Signed)
Summary: ?UTI/CLE   Vital Signs:  Patient Profile:   70 Years Old Female Height:     60.25 inches Weight:      142 pounds Temp:     98.1 degrees F oral Pulse rate:   64 / minute Pulse rhythm:   regular BP sitting:   116 / 58  (left arm) Cuff size:   regular  Vitals Entered By: Liane Comber (September 21, 2007 11:38 AM)                 Chief Complaint:  uti.  History of Present Illness: is having pain in bladder area and increased incontinence pain to urinate , and urine smells stronger than usual no fever or back pain no nausea or vomiting  started symptoms last night late  usually gets up 2-3 times to urinate-- but was increased more frequently   called urologist -- Dr Achilles Dunk -- no appts until sept 25   in general also wants to switch to diff med from ditropan for overactive bladder wants to go back to vesicare     Current Allergies (reviewed today): ! * TETANUS ! HYDROCODONE ! * TESSALON PEARLES ! * SHRIMP ? LIPITOR  Past Medical History:    Reviewed history from 07/14/2007 and no changes required:       Allergic rhinitis       Colonic polyps, hx of       Coronary artery disease       GERD       Hyperlipidemia       Hypertension       Urinary incontinence- overactive bladder       5/09 lichen sclerosis (dx by gyn after abn pap)                     GYN--Dr Kincius  Past Surgical History:    Reviewed history from 07/11/2007 and no changes required:       Hysterectomy       Oophorectomy       LAD angioplasty (1995)       Colonoscopy- polyps (1995)       Cardiolite (08/2001)       Nasal biopsy       EGD- HH, esoph stricture- dilated (05/2005)       Colonoscopy- diverticulosis, polyp       Cataract extraction       5/09 CT of chest- granulomas in upper lobe of lung and liver      Review of Systems  General      Denies chills, fatigue, fever, and loss of appetite.  CV      Denies chest pain or discomfort.  Resp      Denies cough and  shortness of breath.  GI      Denies abdominal pain, bloody stools, change in bowel habits, nausea, and vomiting.  GU      Complains of dysuria, incontinence, nocturia, and urinary frequency.      Denies hematuria and urinary hesitancy.  Derm      Denies itching and rash.  Psych      mood is ok    Physical Exam  General:     Well-developed,well-nourished,in no acute distress; alert,appropriate and cooperative throughout examination Neck:     No deformities, masses, or tenderness noted. Lungs:     Normal respiratory effort, chest expands symmetrically. Lungs are clear to auscultation, no crackles or wheezes. Heart:     Normal rate and regular  rhythm. S1 and S2 normal without gallop, murmur, click, rub or other extra sounds. Abdomen:     mild suprapubic tenderness without rebound or gaurding normal bowel sounds, no distention, no hepatomegaly, and no splenomegaly.   Msk:     no CVA tenderness  Extremities:     No clubbing, cyanosis, edema, or deformity noted with normal full range of motion of all joints.   Skin:     Intact without suspicious lesions or rashes Cervical Nodes:     No lymphadenopathy noted Inguinal Nodes:     No significant adenopathy Psych:     normal affect, talkative and pleasant     Impression & Recommendations:  Problem # 1:  UTI (ICD-599.0) Assessment: New acute and uncomplicated with hx of overactive bladder and incontinence as well will tx with cipro and inc water intake update if worse or no imp in 2 days pend urine culture  The following medications were removed from the medication list:    Ditropan 5 Mg Tabs (Oxybutynin chloride) .Marland Kitchen... Take one by mouth once a day    Zithromax Z-pak 250 Mg Tabs (Azithromycin) .Marland Kitchen... Take by mouth as directed  Her updated medication list for this problem includes:    Cipro 250 Mg Tabs (Ciprofloxacin hcl) .Marland Kitchen... 1 by mouth two times a day for 5 days for bladder infection    Vesicare 5 Mg Tabs (Solifenacin  succinate) .Marland Kitchen... 1 by mouth once daily  Orders: T-Culture, Urine ([id number]) UA Dipstick W/ Micro (manual) (21308)   Problem # 2:  URINARY INCONTINENCE (ICD-788.30) Assessment: Deteriorated with overactive bladder pt will f/u with new urol in fall as planned  will switch ditropan to vesicare- which worked better in past  5 mg to start (can adv to 10 if needed) update if not imp work on wt loss   Complete Medication List: 1)  Omeprazole 20 Mg Tbec (Omeprazole) .Marland Kitchen.. 1 by mouth qam 2)  Diltiazem Hcl Coated Beads 300 Mg Cp24 (Diltiazem hcl coated beads) .... Take one by mouth once a day 3)  Lisinopril 40 Mg Tabs (Lisinopril) .... Take 1 tablet by mouth once a day 4)  Omnipred 1 % Susp (Prednisolone acetate) .... Use one gtt ou daily 5)  Zocor 20 Mg Tabs (Simvastatin) .... Take one by mouth once a day 6)  Guaifenesin-codeine 100-10 Mg/22ml Liqd (Guaifenesin-codeine) .Marland Kitchen.. 1-2 tsp by mouth q 6 hours as needed severe cough 7)  Cipro 250 Mg Tabs (Ciprofloxacin hcl) .Marland Kitchen.. 1 by mouth two times a day for 5 days for bladder infection 8)  Vesicare 5 Mg Tabs (Solifenacin succinate) .Marland Kitchen.. 1 by mouth once daily   Patient Instructions: 1)  continue drinking lots of water 2)  call or seek care is symptoms don't improve in 2-3 days or if you develop back pain, nausea, or vomiting 3)  take the cipro as directed for bladder infection-and finish it all 4)  I will get a urine culture and update you when it returns  5)  change from ditropan to vesicare-- let me know if it does not work better    Prescriptions: VESICARE 5 MG  TABS (SOLIFENACIN SUCCINATE) 1 by mouth once daily  #30 x 2   Entered and Authorized by:   Judith Part MD   Signed by:   Judith Part MD on 09/21/2007   Method used:   Print then Give to Patient   RxID:   [id number] CIPRO 250 MG  TABS (CIPROFLOXACIN HCL) 1 by mouth two times  a day for 5 days for bladder infection  #10 x 0   Entered and Authorized by:   Judith Part MD   Signed by:   Judith Part MD on 09/21/2007   Method used:   Print then Give to Patient   RxID:   [id number]  ] Laboratory Results   Urine Tests  Date/Time Received: September 21, 2007 11:38 AM Date/Time Reported: September 21, 2007 11:38 AM'  Routine Urinalysis   Color: yellow Appearance: Clear Glucose: negative   (Normal Range: Negative) Bilirubin: negative   (Normal Range: Negative) Ketone: negative   (Normal Range: Negative) Spec. Gravity: <1.005   (Normal Range: 1.003-1.035) Blood: trace-intact   (Normal Range: Negative) pH: 7.0   (Normal Range: 5.0-8.0) Protein: negative   (Normal Range: Negative) Urobilinogen: 0.2   (Normal Range: 0-1) Nitrite: negative   (Normal Range: Negative) Leukocyte Esterace: moderate   (Normal Range: Negative)  Urine Microscopic WBC/HPF: 4-5 RBC/HPF: 1-3 Bacteria/HPF: mod Mucous/HPF: few Epithelial/HPF: 1-2 Crystals/HPF: 0 Casts/LPF: 0 Yeast/HPF: 0       Laboratory Results   Urine Tests    Routine Urinalysis   Color: yellow Appearance: Clear Glucose: negative   (Normal Range: Negative) Bilirubin: negative   (Normal Range: Negative) Ketone: negative   (Normal Range: Negative) Spec. Gravity: <1.005   (Normal Range: 1.003-1.035) Blood: trace-intact   (Normal Range: Negative) pH: 7.0   (Normal Range: 5.0-8.0) Protein: negative   (Normal Range: Negative) Urobilinogen: 0.2   (Normal Range: 0-1) Nitrite: negative   (Normal Range: Negative) Leukocyte Esterace: moderate   (Normal Range: Negative)  Urine Microscopic WBC/hpf: 4-5 RBC/hpf: 1-3 Bacteria: mod Mucous: few Epithelial: 1-2 Crystals/LPF: 0 Casts/LPF: 0 Yeast/HPF: 0

## 2010-04-02 ENCOUNTER — Encounter: Payer: Self-pay | Admitting: Family Medicine

## 2010-04-03 ENCOUNTER — Encounter: Payer: Self-pay | Admitting: Family Medicine

## 2010-04-26 ENCOUNTER — Encounter: Payer: Self-pay | Admitting: Family Medicine

## 2010-04-27 NOTE — Letter (Signed)
Summary: [facility]  [facility]   Imported By: Maryln Gottron 04/19/2010 15:48:42  _____________________________________________________________________  External Attachment:    Type:   Image     Comment:   External Document

## 2010-05-11 NOTE — Letter (Signed)
Summary: [facility]   Imported By: Kassie Mends 05/07/2010 08:02:13  _____________________________________________________________________  External Attachment:    Type:   Image     Comment:   External Document

## 2010-05-13 ENCOUNTER — Other Ambulatory Visit (INDEPENDENT_AMBULATORY_CARE_PROVIDER_SITE_OTHER): Payer: Medicare Other

## 2010-05-13 ENCOUNTER — Encounter: Payer: Self-pay | Admitting: *Deleted

## 2010-05-13 ENCOUNTER — Other Ambulatory Visit: Payer: Self-pay | Admitting: Family Medicine

## 2010-05-13 DIAGNOSIS — IMO0001 Reserved for inherently not codable concepts without codable children: Secondary | ICD-10-CM

## 2010-05-13 DIAGNOSIS — E785 Hyperlipidemia, unspecified: Secondary | ICD-10-CM

## 2010-05-13 DIAGNOSIS — E1165 Type 2 diabetes mellitus with hyperglycemia: Secondary | ICD-10-CM

## 2010-05-13 LAB — RENAL FUNCTION PANEL
Albumin: 3.4 g/dL — ABNORMAL LOW (ref 3.5–5.2)
BUN: 14 mg/dL (ref 6–23)
CO2: 29 mEq/L (ref 19–32)
Calcium: 9.4 mg/dL (ref 8.4–10.5)
Chloride: 104 mEq/L (ref 96–112)
Creatinine, Ser: 0.6 mg/dL (ref 0.4–1.2)
GFR: 97 mL/min (ref >60.00)
Glucose, Bld: 85 mg/dL (ref 70–99)
Phosphorus: 4.2 mg/dL (ref 2.3–4.6)
Potassium: 4.6 mEq/L (ref 3.5–5.1)
Sodium: 139 mEq/L (ref 135–145)

## 2010-05-13 LAB — AST: AST: 23 U/L (ref 0–37)

## 2010-05-13 LAB — HEMOGLOBIN A1C: Hgb A1c MFr Bld: 6.2 % (ref 4.6–6.5)

## 2010-05-13 LAB — ALT: ALT: 25 U/L (ref 0–35)

## 2010-05-14 LAB — GLUCOSE, CAPILLARY
Glucose-Capillary: 109 mg/dL — ABNORMAL HIGH (ref 70–99)
Glucose-Capillary: 112 mg/dL — ABNORMAL HIGH (ref 70–99)
Glucose-Capillary: 114 mg/dL — ABNORMAL HIGH (ref 70–99)

## 2010-05-15 LAB — BASIC METABOLIC PANEL
BUN: 9 mg/dL (ref 6–23)
CO2: 21 mEq/L (ref 19–32)
Calcium: 9.6 mg/dL (ref 8.4–10.5)
Chloride: 105 mEq/L (ref 96–112)
Creatinine, Ser: 0.62 mg/dL (ref 0.4–1.2)
GFR calc Af Amer: >60 mL/min (ref >60)
GFR calc non Af Amer: >60 mL/min (ref >60)
Glucose, Bld: 120 mg/dL — ABNORMAL HIGH (ref 70–99)
Potassium: 3.9 mEq/L (ref 3.5–5.1)
Sodium: 137 mEq/L (ref 135–145)

## 2010-05-15 LAB — CK TOTAL AND CKMB (NOT AT ARMC)
CK, MB: 1 ng/mL (ref 0.3–4.0)
CK, MB: 1 ng/mL (ref 0.3–4.0)
Relative Index: 0.2 (ref 0.0–2.5)
Relative Index: 0.2 (ref 0.0–2.5)
Total CK: 581 U/L — ABNORMAL HIGH (ref 7–177)
Total CK: 609 U/L — ABNORMAL HIGH (ref 7–177)

## 2010-05-15 LAB — TROPONIN I
Troponin I: <0.01 ng/mL (ref 0.00–0.06)
Troponin I: <0.01 ng/mL (ref 0.00–0.06)

## 2010-05-16 LAB — URINALYSIS, ROUTINE W REFLEX MICROSCOPIC
Bilirubin Urine: NEGATIVE
Glucose, UA: NEGATIVE mg/dL
Glucose, UA: NEGATIVE mg/dL
Hgb urine dipstick: NEGATIVE
Ketones, ur: 15 mg/dL — AB
Ketones, ur: NEGATIVE mg/dL
Nitrite: POSITIVE — AB
Nitrite: NEGATIVE
Protein, ur: NEGATIVE mg/dL
Protein, ur: NEGATIVE mg/dL
Specific Gravity, Urine: 1.027 (ref 1.005–1.030)
Specific Gravity, Urine: 1.01 (ref 1.005–1.030)
Urobilinogen, UA: 1 mg/dL (ref 0.0–1.0)
Urobilinogen, UA: 0.2 mg/dL (ref 0.0–1.0)
pH: 5 (ref 5.0–8.0)
pH: 5.5 (ref 5.0–8.0)

## 2010-05-16 LAB — BASIC METABOLIC PANEL
BUN: 10 mg/dL (ref 6–23)
BUN: 12 mg/dL (ref 6–23)
BUN: 15 mg/dL (ref 6–23)
CO2: 22 mEq/L (ref 19–32)
CO2: 24 mEq/L (ref 19–32)
CO2: 26 mEq/L (ref 19–32)
Calcium: 8.1 mg/dL — ABNORMAL LOW (ref 8.4–10.5)
Calcium: 8.5 mg/dL (ref 8.4–10.5)
Calcium: 10 mg/dL (ref 8.4–10.5)
Chloride: 112 mEq/L (ref 96–112)
Chloride: 116 mEq/L — ABNORMAL HIGH (ref 96–112)
Chloride: 105 mEq/L (ref 96–112)
Creatinine, Ser: 0.64 mg/dL (ref 0.4–1.2)
Creatinine, Ser: 0.8 mg/dL (ref 0.4–1.2)
Creatinine, Ser: 0.76 mg/dL (ref 0.4–1.2)
GFR calc Af Amer: >60 mL/min (ref >60)
GFR calc Af Amer: >60 mL/min (ref >60)
GFR calc Af Amer: >60 mL/min (ref >60)
GFR calc non Af Amer: >60 mL/min (ref >60)
GFR calc non Af Amer: >60 mL/min (ref >60)
GFR calc non Af Amer: >60 mL/min (ref >60)
Glucose, Bld: 106 mg/dL — ABNORMAL HIGH (ref 70–99)
Glucose, Bld: 117 mg/dL — ABNORMAL HIGH (ref 70–99)
Glucose, Bld: 118 mg/dL — ABNORMAL HIGH (ref 70–99)
Potassium: 3.2 mEq/L — ABNORMAL LOW (ref 3.5–5.1)
Potassium: 4.2 mEq/L (ref 3.5–5.1)
Potassium: 3.7 mEq/L (ref 3.5–5.1)
Sodium: 143 mEq/L (ref 135–145)
Sodium: 144 mEq/L (ref 135–145)
Sodium: 140 mEq/L (ref 135–145)

## 2010-05-16 LAB — DIFFERENTIAL
Basophils Absolute: 0 10*3/uL (ref 0.0–0.1)
Basophils Absolute: 0 10*3/uL (ref 0.0–0.1)
Basophils Absolute: 0.1 10*3/uL (ref 0.0–0.1)
Basophils Relative: 0 % (ref 0–1)
Basophils Relative: 0 % (ref 0–1)
Basophils Relative: 1 % (ref 0–1)
Eosinophils Absolute: 0 10*3/uL (ref 0.0–0.7)
Eosinophils Absolute: 0 10*3/uL (ref 0.0–0.7)
Eosinophils Absolute: 0.1 10*3/uL (ref 0.0–0.7)
Eosinophils Relative: 0 % (ref 0–5)
Eosinophils Relative: 0 % (ref 0–5)
Eosinophils Relative: 1 % (ref 0–5)
Lymphocytes Relative: 16 % (ref 12–46)
Lymphocytes Relative: 17 % (ref 12–46)
Lymphocytes Relative: 36 % (ref 12–46)
Lymphs Abs: 1.7 10*3/uL (ref 0.7–4.0)
Lymphs Abs: 2.2 10*3/uL (ref 0.7–4.0)
Lymphs Abs: 4 10*3/uL (ref 0.7–4.0)
Monocytes Absolute: 0.3 10*3/uL (ref 0.1–1.0)
Monocytes Absolute: 0.5 10*3/uL (ref 0.1–1.0)
Monocytes Absolute: 0.8 10*3/uL (ref 0.1–1.0)
Monocytes Relative: 2 % — ABNORMAL LOW (ref 3–12)
Monocytes Relative: 4 % (ref 3–12)
Monocytes Relative: 8 % (ref 3–12)
Neutro Abs: 10.1 10*3/uL — ABNORMAL HIGH (ref 1.7–7.7)
Neutro Abs: 8.3 10*3/uL — ABNORMAL HIGH (ref 1.7–7.7)
Neutro Abs: 6 10*3/uL (ref 1.7–7.7)
Neutrophils Relative %: 79 % — ABNORMAL HIGH (ref 43–77)
Neutrophils Relative %: 80 % — ABNORMAL HIGH (ref 43–77)
Neutrophils Relative %: 54 % (ref 43–77)

## 2010-05-16 LAB — COMPREHENSIVE METABOLIC PANEL
ALT: 13 U/L (ref 0–35)
ALT: 17 U/L (ref 0–35)
ALT: 22 U/L (ref 0–35)
AST: 14 U/L (ref 0–37)
AST: 17 U/L (ref 0–37)
AST: 25 U/L (ref 0–37)
Albumin: 2.1 g/dL — ABNORMAL LOW (ref 3.5–5.2)
Albumin: 2.4 g/dL — ABNORMAL LOW (ref 3.5–5.2)
Albumin: 3 g/dL — ABNORMAL LOW (ref 3.5–5.2)
Alkaline Phosphatase: 102 U/L (ref 39–117)
Alkaline Phosphatase: 107 U/L (ref 39–117)
Alkaline Phosphatase: 93 U/L (ref 39–117)
BUN: 10 mg/dL (ref 6–23)
BUN: 12 mg/dL (ref 6–23)
BUN: 29 mg/dL — ABNORMAL HIGH (ref 6–23)
CO2: 21 mEq/L (ref 19–32)
CO2: 22 mEq/L (ref 19–32)
CO2: 23 mEq/L (ref 19–32)
Calcium: 7.7 mg/dL — ABNORMAL LOW (ref 8.4–10.5)
Calcium: 8.1 mg/dL — ABNORMAL LOW (ref 8.4–10.5)
Calcium: 8.6 mg/dL (ref 8.4–10.5)
Chloride: 104 mEq/L (ref 96–112)
Chloride: 105 mEq/L (ref 96–112)
Chloride: 109 mEq/L (ref 96–112)
Creatinine, Ser: 0.59 mg/dL (ref 0.4–1.2)
Creatinine, Ser: 0.86 mg/dL (ref 0.4–1.2)
Creatinine, Ser: 2.02 mg/dL — ABNORMAL HIGH (ref 0.4–1.2)
GFR calc Af Amer: 29 mL/min — ABNORMAL LOW (ref >60)
GFR calc Af Amer: >60 mL/min (ref >60)
GFR calc Af Amer: >60 mL/min (ref >60)
GFR calc non Af Amer: 24 mL/min — ABNORMAL LOW (ref >60)
GFR calc non Af Amer: >60 mL/min (ref >60)
GFR calc non Af Amer: >60 mL/min (ref >60)
Glucose, Bld: 130 mg/dL — ABNORMAL HIGH (ref 70–99)
Glucose, Bld: 65 mg/dL — ABNORMAL LOW (ref 70–99)
Glucose, Bld: 70 mg/dL (ref 70–99)
Potassium: 3.4 mEq/L — ABNORMAL LOW (ref 3.5–5.1)
Potassium: 3.5 mEq/L (ref 3.5–5.1)
Potassium: 4.3 mEq/L (ref 3.5–5.1)
Sodium: 135 mEq/L (ref 135–145)
Sodium: 139 mEq/L (ref 135–145)
Sodium: 140 mEq/L (ref 135–145)
Total Bilirubin: 0.6 mg/dL (ref 0.3–1.2)
Total Bilirubin: 0.7 mg/dL (ref 0.3–1.2)
Total Bilirubin: 0.7 mg/dL (ref 0.3–1.2)
Total Protein: 4.8 g/dL — ABNORMAL LOW (ref 6.0–8.3)
Total Protein: 5 g/dL — ABNORMAL LOW (ref 6.0–8.3)
Total Protein: 5.6 g/dL — ABNORMAL LOW (ref 6.0–8.3)

## 2010-05-16 LAB — CBC
HCT: 28.8 % — ABNORMAL LOW (ref 36.0–46.0)
HCT: 28.9 % — ABNORMAL LOW (ref 36.0–46.0)
HCT: 30.3 % — ABNORMAL LOW (ref 36.0–46.0)
HCT: 35.8 % — ABNORMAL LOW (ref 36.0–46.0)
HCT: 41.8 % (ref 36.0–46.0)
HCT: 40.4 % (ref 36.0–46.0)
Hemoglobin: 10 g/dL — ABNORMAL LOW (ref 12.0–15.0)
Hemoglobin: 10.6 g/dL — ABNORMAL LOW (ref 12.0–15.0)
Hemoglobin: 12.2 g/dL (ref 12.0–15.0)
Hemoglobin: 14.4 g/dL (ref 12.0–15.0)
Hemoglobin: 9.8 g/dL — ABNORMAL LOW (ref 12.0–15.0)
Hemoglobin: 13.4 g/dL (ref 12.0–15.0)
MCH: 31 pg (ref 26.0–34.0)
MCHC: 34.1 g/dL (ref 30.0–36.0)
MCHC: 34.1 g/dL (ref 30.0–36.0)
MCHC: 34.3 g/dL (ref 30.0–36.0)
MCHC: 34.8 g/dL (ref 30.0–36.0)
MCHC: 35.1 g/dL (ref 30.0–36.0)
MCHC: 33.2 g/dL (ref 30.0–36.0)
MCV: 95.1 fL (ref 78.0–100.0)
MCV: 95.6 fL (ref 78.0–100.0)
MCV: 95.7 fL (ref 78.0–100.0)
MCV: 96.2 fL (ref 78.0–100.0)
MCV: 97.1 fL (ref 78.0–100.0)
MCV: 93.4 fL (ref 78.0–100.0)
Platelets: 161 10*3/uL (ref 150–400)
Platelets: 167 10*3/uL (ref 150–400)
Platelets: 185 10*3/uL (ref 150–400)
Platelets: 187 10*3/uL (ref 150–400)
Platelets: 215 10*3/uL (ref 150–400)
Platelets: 289 10*3/uL (ref 150–400)
RBC: 3.02 MIL/uL — ABNORMAL LOW (ref 3.87–5.11)
RBC: 3.04 MIL/uL — ABNORMAL LOW (ref 3.87–5.11)
RBC: 3.16 MIL/uL — ABNORMAL LOW (ref 3.87–5.11)
RBC: 3.69 MIL/uL — ABNORMAL LOW (ref 3.87–5.11)
RBC: 4.35 MIL/uL (ref 3.87–5.11)
RBC: 4.33 MIL/uL (ref 3.87–5.11)
RDW: 13.9 % (ref 11.5–15.5)
RDW: 14.4 % (ref 11.5–15.5)
RDW: 14.6 % (ref 11.5–15.5)
RDW: 14.6 % (ref 11.5–15.5)
RDW: 14.6 % (ref 11.5–15.5)
RDW: 15.7 % — ABNORMAL HIGH (ref 11.5–15.5)
WBC: 10.5 10*3/uL (ref 4.0–10.5)
WBC: 12.7 10*3/uL — ABNORMAL HIGH (ref 4.0–10.5)
WBC: 7.4 10*3/uL (ref 4.0–10.5)
WBC: 8.5 10*3/uL (ref 4.0–10.5)
WBC: 8.9 10*3/uL (ref 4.0–10.5)
WBC: 11 10*3/uL — ABNORMAL HIGH (ref 4.0–10.5)

## 2010-05-16 LAB — CARBOXYHEMOGLOBIN
Carboxyhemoglobin: 1.1 % (ref 0.5–1.5)
Carboxyhemoglobin: 1.2 % (ref 0.5–1.5)
Methemoglobin: 0.6 % (ref 0.0–1.5)
Methemoglobin: 1.2 % (ref 0.0–1.5)
O2 Saturation: 30.7 %
O2 Saturation: 38.2 %
Total hemoglobin: 10.6 g/dL — ABNORMAL LOW (ref 12.5–16.0)
Total hemoglobin: 12.2 g/dL — ABNORMAL LOW (ref 12.5–16.0)

## 2010-05-16 LAB — GLUCOSE, CAPILLARY
Glucose-Capillary: 104 mg/dL — ABNORMAL HIGH (ref 70–99)
Glucose-Capillary: 106 mg/dL — ABNORMAL HIGH (ref 70–99)
Glucose-Capillary: 107 mg/dL — ABNORMAL HIGH (ref 70–99)
Glucose-Capillary: 111 mg/dL — ABNORMAL HIGH (ref 70–99)
Glucose-Capillary: 111 mg/dL — ABNORMAL HIGH (ref 70–99)
Glucose-Capillary: 113 mg/dL — ABNORMAL HIGH (ref 70–99)
Glucose-Capillary: 116 mg/dL — ABNORMAL HIGH (ref 70–99)
Glucose-Capillary: 116 mg/dL — ABNORMAL HIGH (ref 70–99)
Glucose-Capillary: 118 mg/dL — ABNORMAL HIGH (ref 70–99)
Glucose-Capillary: 125 mg/dL — ABNORMAL HIGH (ref 70–99)
Glucose-Capillary: 132 mg/dL — ABNORMAL HIGH (ref 70–99)
Glucose-Capillary: 135 mg/dL — ABNORMAL HIGH (ref 70–99)
Glucose-Capillary: 136 mg/dL — ABNORMAL HIGH (ref 70–99)
Glucose-Capillary: 140 mg/dL — ABNORMAL HIGH (ref 70–99)
Glucose-Capillary: 143 mg/dL — ABNORMAL HIGH (ref 70–99)
Glucose-Capillary: 148 mg/dL — ABNORMAL HIGH (ref 70–99)
Glucose-Capillary: 152 mg/dL — ABNORMAL HIGH (ref 70–99)
Glucose-Capillary: 167 mg/dL — ABNORMAL HIGH (ref 70–99)
Glucose-Capillary: 181 mg/dL — ABNORMAL HIGH (ref 70–99)
Glucose-Capillary: 56 mg/dL — ABNORMAL LOW (ref 70–99)
Glucose-Capillary: 60 mg/dL — ABNORMAL LOW (ref 70–99)
Glucose-Capillary: 63 mg/dL — ABNORMAL LOW (ref 70–99)
Glucose-Capillary: 74 mg/dL (ref 70–99)
Glucose-Capillary: 83 mg/dL (ref 70–99)

## 2010-05-16 LAB — CK TOTAL AND CKMB (NOT AT ARMC)
CK, MB: 0.5 ng/mL (ref 0.3–4.0)
CK, MB: 1.6 ng/mL (ref 0.3–4.0)
Relative Index: INVALID (ref 0.0–2.5)
Relative Index: 0.3 (ref 0.0–2.5)
Total CK: 18 U/L (ref 7–177)
Total CK: 601 U/L — ABNORMAL HIGH (ref 7–177)

## 2010-05-16 LAB — LACTIC ACID, PLASMA
Lactic Acid, Venous: 0.9 mmol/L (ref 0.5–2.2)
Lactic Acid, Venous: 1 mmol/L (ref 0.5–2.2)
Lactic Acid, Venous: 1.1 mmol/L (ref 0.5–2.2)
Lactic Acid, Venous: 1.5 mmol/L (ref 0.5–2.2)
Lactic Acid, Venous: 1.5 mmol/L (ref 0.5–2.2)
Lactic Acid, Venous: 1.6 mmol/L (ref 0.5–2.2)
Lactic Acid, Venous: 1.7 mmol/L (ref 0.5–2.2)
Lactic Acid, Venous: 1.7 mmol/L (ref 0.5–2.2)
Lactic Acid, Venous: 1.8 mmol/L (ref 0.5–2.2)
Lactic Acid, Venous: 1.9 mmol/L (ref 0.5–2.2)
Lactic Acid, Venous: 1.9 mmol/L (ref 0.5–2.2)
Lactic Acid, Venous: 2.2 mmol/L (ref 0.5–2.2)
Lactic Acid, Venous: 2.3 mmol/L — ABNORMAL HIGH (ref 0.5–2.2)
Lactic Acid, Venous: 2.3 mmol/L — ABNORMAL HIGH (ref 0.5–2.2)
Lactic Acid, Venous: 2.7 mmol/L — ABNORMAL HIGH (ref 0.5–2.2)
Lactic Acid, Venous: 5.4 mmol/L — ABNORMAL HIGH (ref 0.5–2.2)

## 2010-05-16 LAB — CARDIAC PANEL(CRET KIN+CKTOT+MB+TROPI)
CK, MB: 0.4 ng/mL (ref 0.3–4.0)
CK, MB: 0.8 ng/mL (ref 0.3–4.0)
Relative Index: INVALID (ref 0.0–2.5)
Relative Index: INVALID (ref 0.0–2.5)
Total CK: 20 U/L (ref 7–177)
Total CK: 22 U/L (ref 7–177)
Troponin I: 0.01 ng/mL (ref 0.00–0.06)
Troponin I: 0.05 ng/mL (ref 0.00–0.06)

## 2010-05-16 LAB — CLOSTRIDIUM DIFFICILE EIA
C difficile Toxins A+B, EIA: NEGATIVE
C difficile Toxins A+B, EIA: NEGATIVE

## 2010-05-16 LAB — LIPID PANEL
Cholesterol: 123 mg/dL (ref 0–200)
HDL: 61 mg/dL (ref >39)
LDL Cholesterol: 44 mg/dL (ref 0–99)
Total CHOL/HDL Ratio: 2 RATIO
Triglycerides: 91 mg/dL (ref <150)
VLDL: 18 mg/dL (ref 0–40)

## 2010-05-16 LAB — URINE MICROSCOPIC-ADD ON

## 2010-05-16 LAB — STOOL CULTURE

## 2010-05-16 LAB — POCT I-STAT, CHEM 8
BUN: 16 mg/dL (ref 6–23)
Calcium, Ion: 1.27 mmol/L (ref 1.12–1.32)
Chloride: 103 mEq/L (ref 96–112)
Creatinine, Ser: 0.7 mg/dL (ref 0.4–1.2)
Glucose, Bld: 114 mg/dL — ABNORMAL HIGH (ref 70–99)
HCT: 41 % (ref 36.0–46.0)
Hemoglobin: 13.9 g/dL (ref 12.0–15.0)
Potassium: 3.8 mEq/L (ref 3.5–5.1)
Sodium: 139 mEq/L (ref 135–145)
TCO2: 27 mmol/L (ref 0–100)

## 2010-05-16 LAB — URINE CULTURE: Colony Count: 7000

## 2010-05-16 LAB — LIPASE, BLOOD: Lipase: <10 U/L — ABNORMAL LOW (ref 11–59)

## 2010-05-16 LAB — CULTURE, BLOOD (ROUTINE X 2)
Culture: NO GROWTH
Culture: NO GROWTH

## 2010-05-16 LAB — POCT CARDIAC MARKERS
CKMB, poc: 1.5 ng/mL (ref 1.0–8.0)
Myoglobin, poc: 75.4 ng/mL (ref 12–200)
Troponin i, poc: <0.05 ng/mL (ref 0.00–0.09)

## 2010-05-16 LAB — PROTIME-INR
INR: 1.36 (ref 0.00–1.49)
Prothrombin Time: 16.7 seconds — ABNORMAL HIGH (ref 11.6–15.2)

## 2010-05-16 LAB — PHOSPHORUS: Phosphorus: 2.8 mg/dL (ref 2.3–4.6)

## 2010-05-16 LAB — MAGNESIUM
Magnesium: 1.9 mg/dL (ref 1.5–2.5)
Magnesium: 2.2 mg/dL (ref 1.5–2.5)

## 2010-05-16 LAB — TROPONIN I
Troponin I: 0.01 ng/mL (ref 0.00–0.06)
Troponin I: 0.11 ng/mL — ABNORMAL HIGH (ref 0.00–0.06)

## 2010-05-16 LAB — GIARDIA/CRYPTOSPORIDIUM SCREEN(EIA)
Cryptosporidium Screen (EIA): NEGATIVE
Giardia Screen - EIA: NEGATIVE

## 2010-05-16 LAB — HEMOGLOBIN A1C
Hgb A1c MFr Bld: 6.1 % (ref 4.6–6.1)
Mean Plasma Glucose: 128 mg/dL

## 2010-05-16 LAB — HEMOCCULT GUIAC POC 1CARD (OFFICE)
Fecal Occult Bld: NEGATIVE
Fecal Occult Bld: POSITIVE

## 2010-05-16 LAB — TSH: TSH: 0.885 u[IU]/mL (ref 0.350–4.500)

## 2010-05-20 ENCOUNTER — Ambulatory Visit: Payer: Self-pay | Admitting: Family Medicine

## 2010-06-01 ENCOUNTER — Ambulatory Visit (INDEPENDENT_AMBULATORY_CARE_PROVIDER_SITE_OTHER): Payer: Medicare Other | Admitting: Family Medicine

## 2010-06-01 ENCOUNTER — Encounter: Payer: Self-pay | Admitting: Family Medicine

## 2010-06-01 DIAGNOSIS — I10 Essential (primary) hypertension: Secondary | ICD-10-CM

## 2010-06-01 DIAGNOSIS — IMO0001 Reserved for inherently not codable concepts without codable children: Secondary | ICD-10-CM

## 2010-06-01 DIAGNOSIS — L129 Pemphigoid, unspecified: Secondary | ICD-10-CM

## 2010-06-01 NOTE — Assessment & Plan Note (Signed)
This is now well controlled with metformin and dec pred dose Adv to continue healthy dm diet and exercise  Hope for continued improvement opthy up to date

## 2010-06-01 NOTE — Progress Notes (Signed)
Subjective:    Patient ID: Kevan Ny, female    DOB: 04/21/1940, 70 y.o.   MRN: [id number]  HPI  Here for f/u of DM and HTN  Feels pretty good overall  Sometimes fatigued   Dr Daleen Squibb put her on crestor  Cannot take lipitor  Used to be on simvastain   Got a new hearing aid   Wt is stable   AIC is down from 6.5 to 6.2 On metformin  Has been closely watching her diet -- and exercise when she can  Steroids -- still on prednisone 30/ 15 alternating - doing better   HTN is well controlled with bp of 128/72   Pemphigoid -on cytoxan and also prednisone She does not know if it is working Also did IGG IV treatment - helped temporarily- f/u with specialist Monday  Sees opthy ever 3-4 weeks for inc pressure - Dr Dellie Burns   Past Medical History  Diagnosis Date  . Allergic rhinitis   . GERD (gastroesophageal reflux disease)   . CAD (coronary artery disease)   . Hyperlipemia   . Hypertension   . Urinary incontinence     OVERACTIVE BLADDER  . Lichen sclerosus 05/09    (DX BY GYN AFTER ABN PAP)  . Pemphigoid, cicatricial     D/O AFFECTING EYES, MOUTH, THROAT NOW (DX [facility])  . Elevated LFTs   . History of hepatitis     Past Surgical History  Procedure Date  . Oophorectomy   . Angioplasty     LAD  . Nose surgery     Biopsy  . Cataract extraction   . Cardiac electrophysiology mapping and ablation     History   Social History  . Marital Status: Married    Spouse Name: N/A    Number of Children: 2  . Years of Education: N/A   Occupational History  . Retired    Social History Main Topics  . Smoking status: Former Smoker    Quit date: 02/29/1968  . Smokeless tobacco: Not on file  . Alcohol Use: No  . Drug Use: Not on file  . Sexually Active: Not on file   Other Topics Concern  . Not on file   Social History Narrative   Husband is fighting cancer. Exercises on stationary bike/walks    Family History  Problem Relation Age of Onset  . Stroke Mother    CVA  . Alzheimer's disease Mother   . Cancer Father     lung  . Hypertension Brother   . Cancer Son     esoph              Review of Systems  Constitutional: Negative for activity change, appetite change, fatigue and unexpected weight change.  Eyes: Negative for pain and visual disturbance.  Respiratory: Negative for cough and stridor.   Cardiovascular: Negative.   Gastrointestinal: Negative for abdominal pain and constipation.  Genitourinary: Negative for urgency and frequency.  Musculoskeletal: Negative for myalgias and arthralgias.  Skin: Negative for color change and pallor.  Neurological: Negative for weakness and headaches.  Hematological: Bruises/bleeds easily.  Psychiatric/Behavioral: The patient is not nervous/anxious.        Objective:   Physical Exam  Constitutional: She appears well-developed and well-nourished.  HENT:  Head: Normocephalic and atraumatic.  Eyes: Conjunctivae and EOM are normal. Pupils are equal, round, and reactive to light.  Neck: Normal range of motion. Neck supple. No JVD present. Carotid bruit is not present. No thyromegaly present.  Cardiovascular:  Normal rate, regular rhythm and normal heart sounds.   Pulmonary/Chest: Effort normal and breath sounds normal.  Abdominal: Soft. Bowel sounds are normal. She exhibits no mass. There is no tenderness.  Musculoskeletal: She exhibits no edema and no tenderness.  Lymphadenopathy:    She has no cervical adenopathy.  Neurological: She is alert. She has normal reflexes. Coordination normal.  Skin: Skin is warm and dry.  Psychiatric: She has a normal mood and affect.          Assessment & Plan:

## 2010-06-01 NOTE — Assessment & Plan Note (Signed)
Good conrol - no problems with current regimen  Enc exercise as tol  Lab and f/u in 6 mo

## 2010-06-01 NOTE — Patient Instructions (Signed)
Keep up the healthy diet  No change in medicines Keep up with eye exams  Follow up in 6 months for 30 minute check up with labs prior

## 2010-06-01 NOTE — Assessment & Plan Note (Signed)
On cytoxin/ IGG and prednisone  Some mild imp Will continue f/u specialty Good attitude

## 2010-06-15 ENCOUNTER — Other Ambulatory Visit: Payer: Self-pay | Admitting: Family Medicine

## 2010-07-16 NOTE — Assessment & Plan Note (Signed)
[facility]                                 ON-CALL NOTE   NAME:Jeanpaul, Lynesha                        MRN:          [id number]  DATE:01/22/2006                            DOB:          02/13/1941    Time received:  7:12 a.m.   Caller:  Emelia Salisbury.   She sees Dr. Milinda Antis.   Telephone:  [phone].   The patient has a history of urinary tract infections and now feels that  she has another one.  Her symptoms began last night.  They include  urinary urgency and frequency, also some burning.  She has had mild  nausea but no vomiting, no back pain, no fever.  She is drinking lots of  fluids.  My response is to get AZO Standard over the counter for  discomfort and I called in 7 days of Macrobid 100 mg to take b.i.d. to  [facility] at [phone].  She can follow up as needed.     Tera Mater. Clent Ridges, MD  Electronically Signed    SAF/MedQ  DD: 01/22/2006  DT: 01/22/2006  Job #: [id number]

## 2010-09-21 ENCOUNTER — Telehealth: Payer: Self-pay | Admitting: *Deleted

## 2010-09-21 MED ORDER — GLUCOSE BLOOD VI STRP: ORAL_STRIP | Status: DC

## 2010-11-04 ENCOUNTER — Other Ambulatory Visit: Payer: Self-pay | Admitting: Family Medicine

## 2010-11-25 ENCOUNTER — Telehealth (INDEPENDENT_AMBULATORY_CARE_PROVIDER_SITE_OTHER): Payer: Medicare Other | Admitting: *Deleted

## 2010-11-25 ENCOUNTER — Other Ambulatory Visit (INDEPENDENT_AMBULATORY_CARE_PROVIDER_SITE_OTHER): Payer: Medicare Other

## 2010-11-25 DIAGNOSIS — I10 Essential (primary) hypertension: Secondary | ICD-10-CM

## 2010-11-25 DIAGNOSIS — IMO0001 Reserved for inherently not codable concepts without codable children: Secondary | ICD-10-CM

## 2010-11-25 DIAGNOSIS — E785 Hyperlipidemia, unspecified: Secondary | ICD-10-CM

## 2010-11-25 LAB — CBC WITH DIFFERENTIAL/PLATELET
Basophils Absolute: 0 10*3/uL (ref 0.0–0.1)
Basophils Relative: 0.8 % (ref 0.0–3.0)
Eosinophils Absolute: 0.1 10*3/uL (ref 0.0–0.7)
Eosinophils Relative: 3.1 % (ref 0.0–5.0)
HCT: 38.1 % (ref 36.0–46.0)
Hemoglobin: 12.6 g/dL (ref 12.0–15.0)
Lymphocytes Relative: 17.6 % (ref 12.0–46.0)
Lymphs Abs: 0.7 10*3/uL (ref 0.7–4.0)
MCHC: 33.1 g/dL (ref 30.0–36.0)
MCV: 102.8 fl — ABNORMAL HIGH (ref 78.0–100.0)
Monocytes Absolute: 0.5 10*3/uL (ref 0.1–1.0)
Monocytes Relative: 11.5 % (ref 3.0–12.0)
Neutro Abs: 2.6 10*3/uL (ref 1.4–7.7)
Neutrophils Relative %: 67 % (ref 43.0–77.0)
Platelets: 177 10*3/uL (ref 150.0–400.0)
RBC: 3.7 Mil/uL — ABNORMAL LOW (ref 3.87–5.11)
RDW: 15.5 % — ABNORMAL HIGH (ref 11.5–14.6)
WBC: 4 10*3/uL — ABNORMAL LOW (ref 4.5–10.5)

## 2010-11-25 NOTE — Telephone Encounter (Signed)
Dr Milinda Antis, pt came in for 6 month labs , but orders aren't in chart.  She is coming back to see you next week.

## 2010-11-26 LAB — LIPID PANEL
Cholesterol: 263 mg/dL — ABNORMAL HIGH (ref 0–200)
HDL: 53.3 mg/dL (ref >39.00)
Total CHOL/HDL Ratio: 5
Triglycerides: 167 mg/dL — ABNORMAL HIGH (ref 0.0–149.0)
VLDL: 33.4 mg/dL (ref 0.0–40.0)

## 2010-11-26 LAB — COMPREHENSIVE METABOLIC PANEL
ALT: 17 U/L (ref 0–35)
AST: 18 U/L (ref 0–37)
Albumin: 3.6 g/dL (ref 3.5–5.2)
Alkaline Phosphatase: 60 U/L (ref 39–117)
BUN: 17 mg/dL (ref 6–23)
CO2: 28 mEq/L (ref 19–32)
Calcium: 8.9 mg/dL (ref 8.4–10.5)
Chloride: 109 mEq/L (ref 96–112)
Creatinine, Ser: 0.6 mg/dL (ref 0.4–1.2)
GFR: 115.37 mL/min (ref >60.00)
Glucose, Bld: 79 mg/dL (ref 70–99)
Potassium: 4.2 mEq/L (ref 3.5–5.1)
Sodium: 144 mEq/L (ref 135–145)
Total Bilirubin: 0.5 mg/dL (ref 0.3–1.2)
Total Protein: 5.7 g/dL — ABNORMAL LOW (ref 6.0–8.3)

## 2010-11-26 LAB — HEMOGLOBIN A1C: Hgb A1c MFr Bld: 5.3 % (ref 4.6–6.5)

## 2010-11-26 LAB — LDL CHOLESTEROL, DIRECT: Direct LDL: 184 mg/dL

## 2010-11-26 LAB — TSH: TSH: 2.54 u[IU]/mL (ref 0.35–5.50)

## 2010-12-03 ENCOUNTER — Encounter: Payer: Self-pay | Admitting: Family Medicine

## 2010-12-03 ENCOUNTER — Ambulatory Visit (INDEPENDENT_AMBULATORY_CARE_PROVIDER_SITE_OTHER): Payer: Medicare Other | Admitting: Family Medicine

## 2010-12-03 VITALS — BP 124/60 | HR 80 | Temp 98.0°F | Ht 60.0 in | Wt 127.8 lb

## 2010-12-03 DIAGNOSIS — E785 Hyperlipidemia, unspecified: Secondary | ICD-10-CM

## 2010-12-03 DIAGNOSIS — L129 Pemphigoid, unspecified: Secondary | ICD-10-CM

## 2010-12-03 DIAGNOSIS — Z23 Encounter for immunization: Secondary | ICD-10-CM

## 2010-12-03 DIAGNOSIS — I10 Essential (primary) hypertension: Secondary | ICD-10-CM

## 2010-12-03 DIAGNOSIS — Z1231 Encounter for screening mammogram for malignant neoplasm of breast: Secondary | ICD-10-CM | POA: Insufficient documentation

## 2010-12-03 DIAGNOSIS — IMO0001 Reserved for inherently not codable concepts without codable children: Secondary | ICD-10-CM

## 2010-12-03 MED ORDER — LISINOPRIL 10 MG PO TABS: 10.0000 mg | ORAL_TABLET | Freq: Every day | ORAL | Status: DC

## 2010-12-03 MED ORDER — METFORMIN HCL 500 MG PO TABS: ORAL_TABLET | ORAL | Status: DC

## 2010-12-03 NOTE — Assessment & Plan Note (Signed)
A little better Continues with specialist f/u and low dose prednisone

## 2010-12-03 NOTE — Assessment & Plan Note (Signed)
With less prednisone a1c is down lower to 5.3 May be able to stop metformin in future Doing great with diet - urged to keep it up  opthy up to date

## 2010-12-03 NOTE — Assessment & Plan Note (Signed)
bp in good control  No change in med- refilled ace  Disc exercise as tol

## 2010-12-03 NOTE — Assessment & Plan Note (Signed)
Nl breast exam today  sched mam Enc self exams

## 2010-12-03 NOTE — Assessment & Plan Note (Signed)
Chol is way up off crestor Samples and discount card given to get pt thru the donut hole Rev low sat fat diet in detail

## 2010-12-03 NOTE — Patient Instructions (Addendum)
Get back on crestor when you can - cholesterol is high Sugar control is great Avoid red meat/ fried foods/ egg yolks/ fatty breakfast meats/ butter, cheese and high fat dairy/ and shellfish / biscuits  We will schedule mammogram at check out  Follow up 6 months Flu shot today

## 2010-12-03 NOTE — Progress Notes (Signed)
Subjective:    Patient ID: Victoria Allison, female    DOB: 09-24-1940, 70 y.o.   MRN: [id number]  HPI Here for check up of chronic medical problems and to rev health mt list   Has tried many meds for her pemphigoid - makes her feel bad  Still on prednisone   Wt is up 2 lb with bmi of 24  Lipids are up - not on crestor - then met her donut hole and could not afford it  No side effects Worked well for cholesterol She really does make an attempt to decrease her sat fats in diet  LDL 184 Lab Results  Component Value Date   CHOL 263* 11/25/2010   CHOL 145 11/05/2009   CHOL 185 05/04/2009   Lab Results  Component Value Date   HDL 53.30 11/25/2010   HDL 56.21 11/05/2009   HDL 60.30 05/04/2009   Lab Results  Component Value Date   LDLCALC 79 11/05/2009   LDLCALC 104* 05/04/2009   LDLCALC  Value: 44        Total Cholesterol/HDL:CHD Risk Coronary Heart Disease Risk Table                     Men   Women  1/2 Average Risk   3.4   3.3  Average Risk       5.0   4.4  2 X Average Risk   9.6   7.1  3 X Average Risk  23.4   11.0        Use the calculated Patient Ratio above and the CHD Risk Table to determine the patient's CHD Risk.        ATP III CLASSIFICATION (LDL):  <100     mg/dL   Optimal  308-657  mg/dL   Near or Above                    Optimal  130-159  mg/dL   Borderline  846-962  mg/dL   High  >952     mg/dL   Very High 10/02/1322   Lab Results  Component Value Date   TRIG 167.0* 11/25/2010   TRIG 127.0 11/05/2009   TRIG 103.0 05/04/2009   Lab Results  Component Value Date   CHOLHDL 5 11/25/2010   CHOLHDL 4 11/05/2009   CHOLHDL 3 05/04/2009   Lab Results  Component Value Date   LDLDIRECT 184.0 11/25/2010   LDLDIRECT 86.7 02/04/2009     DM is better controlled Metformin and diet  A1c is 5.3- great  Prednisone- has decreased and that helped Is working on diet - will power !- eating less and giving up ice cream  opthy - going to Dr Dellie Burns every 4-6 weeks - last one month ago    Zoster- no  - due to prednisone (had disease)  Flu- will get today  Td 2010 Pneumovax 2010  dexa nl in 5/10- re assuring  She does take her calcium and vitamin D  Does not want a pap and no problems except incontinence - sees Dr Achilles Dunk  colonosc 6/10- was normal   Patient Active Problem List  Diagnoses  . Diabetes type 2, controlled  . HYPERLIPIDEMIA  . HYPERTENSION  . CORONARY ARTERY DISEASE  . SUPRAVENTRICULAR TACHYCARDIA  . ALLERGIC RHINITIS  . GERD  . HIATAL HERNIA  . PEMPHIGOID  . HAIR LOSS  . ARTHRITIS, SHOULDER  . LEG PAIN, CHRONIC  . INSOMNIA  . SHORTNESS OF BREATH  .  URINARY INCONTINENCE  . Nonspecific (abnormal) findings on radiological and other examination of body structure  . COLONIC POLYPS, HX OF  . HX, URINARY INFECTION  . POSTMENOPAUSAL STATUS  . NONSPECIFIC ABNORM FIND RAD&OTH EXAM LUNG FIELD  . TRANSAMINASES, SERUM, ELEVATED  . Other screening mammogram   Past Medical History  Diagnosis Date  . Allergic rhinitis   . GERD (gastroesophageal reflux disease)   . CAD (coronary artery disease)   . Hyperlipemia   . Hypertension   . Urinary incontinence     OVERACTIVE BLADDER  . Lichen sclerosus 05/09    (DX BY GYN AFTER ABN PAP)  . Pemphigoid, cicatricial     D/O AFFECTING EYES, MOUTH, THROAT NOW (DX [facility])  . Elevated LFTs   . History of hepatitis    Past Surgical History  Procedure Date  . Oophorectomy   . Angioplasty     LAD  . Nose surgery     Biopsy  . Cataract extraction   . Cardiac electrophysiology mapping and ablation    History  Substance Use Topics  . Smoking status: Former Smoker    Quit date: 02/29/1968  . Smokeless tobacco: Not on file  . Alcohol Use: No   Family History  Problem Relation Age of Onset  . Stroke Mother     CVA  . Alzheimer's disease Mother   . Cancer Father     lung  . Hypertension Brother   . Cancer Son     esoph   Allergies  Allergen Reactions  . Aspirin     REACTION: bleeds too easily-  . Atorvastatin       REACTION: elevated LFT's  . Hydrocodone     REACTION: makes her loopy  . Tetanus Toxoid     REACTION: rash   Current Outpatient Prescriptions on File Prior to Visit  Medication Sig Dispense Refill  . Bimatoprost (LUMIGAN) 0.01 % SOLN One drop each eye daily       . cyclophosphamide (CYTOXAN) 50 MG tablet Take 100 mg by mouth daily. Give on an empty stomach 1 hour before or 2 hours after meals.        Marland Kitchen glucose blood test strip To check sugar once daily and as needed for diabetes 250.0  100 each  11  . glucose monitoring kit (FREESTYLE) monitoring kit 1 each. To check sugar once daily and as needed for diabetes 250.0       . omeprazole (PRILOSEC) 20 MG capsule TAKE ONE CAPSULE BY MOUTH EVERY MORNING  90 capsule  1  . prednisoLONE acetate (OMNIPRED) 1 % ophthalmic suspension One drop in each eye daily       . predniSONE (DELTASONE) 10 MG tablet Three tablets by mouth every other day alternating with 1/4 tablet by mouth every other day.      Marland Kitchen acetaminophen (TYLENOL) 325 MG tablet Take 325 mg by mouth. OTC as directed       . rosuvastatin (CRESTOR) 10 MG tablet Take 10 mg by mouth daily.        . solifenacin (VESICARE) 10 MG tablet Take 10 mg by mouth daily.              Review of Systems Review of Systems  Constitutional: Negative for fever, appetite change, and unexpected weight change. some fatigue Eyes: Negative for pain and visual disturbance.  Respiratory: Negative for cough and shortness of breath.   Cardiovascular: Negative for cp or palpitations    Gastrointestinal: Negative for nausea, diarrhea  and constipation.  Genitourinary: Negative for urgency and frequency.  Skin: Negative for pallor or rash   Neurological: Negative for weakness, light-headedness, numbness and headaches.  Hematological: Negative for adenopathy. Does not bruise/bleed easily.  Psychiatric/Behavioral: Negative for dysphoric mood. The patient is not nervous/anxious.          Objective:   Physical  Exam  Constitutional: She appears well-developed and well-nourished. No distress.  HENT:  Head: Normocephalic and atraumatic.  Right Ear: External ear normal.  Left Ear: External ear normal.  Nose: Nose normal.       Pemphigoid is evident - oral  Eyes: Conjunctivae and EOM are normal. Pupils are equal, round, and reactive to light. Right eye exhibits no discharge. Left eye exhibits no discharge.  Neck: Normal range of motion. Neck supple. Carotid bruit is not present. No tracheal deviation present. No thyromegaly present.  Cardiovascular: Normal rate, regular rhythm and normal heart sounds.   No murmur heard. Pulmonary/Chest: Effort normal and breath sounds normal. No respiratory distress. She has no wheezes.  Abdominal: Soft. Bowel sounds are normal. She exhibits no distension, no abdominal bruit and no mass. There is no tenderness.  Genitourinary: No breast swelling, tenderness, discharge or bleeding.  Musculoskeletal: Normal range of motion. She exhibits no edema and no tenderness.  Lymphadenopathy:    She has no cervical adenopathy.  Neurological: She is alert. She has normal reflexes. No cranial nerve deficit. Coordination normal.  Skin: Skin is warm and dry. No rash noted. No erythema. No pallor.  Psychiatric: She has a normal mood and affect.          Assessment & Plan:

## 2010-12-06 DIAGNOSIS — D481 Neoplasm of uncertain behavior of connective and other soft tissue: Secondary | ICD-10-CM | POA: Insufficient documentation

## 2010-12-09 ENCOUNTER — Ambulatory Visit: Payer: PRIVATE HEALTH INSURANCE | Admitting: Family Medicine

## 2010-12-10 ENCOUNTER — Encounter: Payer: Self-pay | Admitting: Family Medicine

## 2010-12-13 ENCOUNTER — Encounter: Payer: Self-pay | Admitting: *Deleted

## 2010-12-14 ENCOUNTER — Ambulatory Visit (INDEPENDENT_AMBULATORY_CARE_PROVIDER_SITE_OTHER): Payer: Medicare Other | Admitting: Cardiology

## 2010-12-14 ENCOUNTER — Encounter: Payer: Self-pay | Admitting: Cardiology

## 2010-12-14 VITALS — BP 122/68 | HR 79 | Ht 60.0 in | Wt 127.0 lb

## 2010-12-14 DIAGNOSIS — E785 Hyperlipidemia, unspecified: Secondary | ICD-10-CM

## 2010-12-14 DIAGNOSIS — I498 Other specified cardiac arrhythmias: Secondary | ICD-10-CM

## 2010-12-14 DIAGNOSIS — I2510 Atherosclerotic heart disease of native coronary artery without angina pectoris: Secondary | ICD-10-CM

## 2010-12-14 MED ORDER — PRAVASTATIN SODIUM 40 MG PO TABS: 40.0000 mg | ORAL_TABLET | Freq: Every day | ORAL | Status: DC

## 2010-12-14 NOTE — Progress Notes (Signed)
HPI Victoria Allison comes in today for followup of her coronary artery disease and other risk factors. She is having no angina or ischemic symptoms.  She specifically denies any palpitations or focal symptoms of supraventricular tachycardia.  She could not afford Crestor. Her last cholesterol was extremely high with an LDL of 170s. She has an intolerance to Lipitor.    Past Medical History  Diagnosis Date  . Allergic rhinitis   . GERD (gastroesophageal reflux disease)   . CAD (coronary artery disease)   . Hyperlipemia   . Hypertension   . Urinary incontinence     OVERACTIVE BLADDER  . Lichen sclerosus 05/09    (DX BY GYN AFTER ABN PAP)  . Pemphigoid, cicatricial     D/O AFFECTING EYES, MOUTH, THROAT NOW (DX [facility])  . Elevated LFTs   . History of hepatitis     Past Surgical History  Procedure Date  . Oophorectomy   . Angioplasty     LAD  . Nose surgery     Biopsy  . Cataract extraction   . Cardiac electrophysiology mapping and ablation     Family History  Problem Relation Age of Onset  . Stroke Mother     CVA  . Alzheimer's disease Mother   . Cancer Father     lung  . Hypertension Brother   . Cancer Son     esoph    History   Social History  . Marital Status: Married    Spouse Name: N/A    Number of Children: 2  . Years of Education: N/A   Occupational History  . Retired    Social History Main Topics  . Smoking status: Former Smoker    Quit date: 02/29/1968  . Smokeless tobacco: Not on file  . Alcohol Use: No  . Drug Use: Not on file  . Sexually Active: Not on file   Other Topics Concern  . Not on file   Social History Narrative   Husband is fighting cancer. Exercises on stationary bike/walks    Allergies  Allergen Reactions  . Aspirin     REACTION: bleeds too easily-  . Atorvastatin     REACTION: elevated LFT's  . Hydrocodone     REACTION: makes her loopy  . Tetanus Toxoid     REACTION: rash    Current Outpatient Prescriptions  Medication  Sig Dispense Refill  . Bimatoprost (LUMIGAN) 0.01 % SOLN One drop each eye daily       . cyclophosphamide (CYTOXAN) 50 MG tablet Take 100 mg by mouth daily. Give on an empty stomach 1 hour before or 2 hours after meals.        Marland Kitchen glucose blood test strip To check sugar once daily and as needed for diabetes 250.0  100 each  11  . glucose monitoring kit (FREESTYLE) monitoring kit 1 each. To check sugar once daily and as needed for diabetes 250.0       . lisinopril (PRINIVIL,ZESTRIL) 10 MG tablet Take 1 tablet (10 mg total) by mouth daily.  90 tablet  3  . metFORMIN (GLUCOPHAGE) 500 MG tablet 1/2 pill by mouth twice daily  90 tablet  3  . nitrofurantoin, macrocrystal-monohydrate, (MACROBID) 100 MG capsule Take 1 tablet by mouth Daily.      Marland Kitchen omeprazole (PRILOSEC) 20 MG capsule TAKE ONE CAPSULE BY MOUTH EVERY MORNING  90 capsule  1  . oxybutynin (DITROPAN XL) 15 MG 24 hr tablet Take 15 mg by mouth daily.        Marland Kitchen  prednisoLONE acetate (OMNIPRED) 1 % ophthalmic suspension One drop in each eye daily       . predniSONE (DELTASONE) 10 MG tablet Three tablets by mouth every other day alternating with 1/4 tablet by mouth every other day.      . solifenacin (VESICARE) 10 MG tablet Take 10 mg by mouth daily.        Marland Kitchen acetaminophen (TYLENOL) 325 MG tablet Take 325 mg by mouth. OTC as directed         ROS Negative other than HPI.   PE General Appearance: well developed, well nourished in no acute distress HEENT: symmetrical face, PERRLA, good dentition  Neck: no JVD, thyromegaly, or adenopathy, trachea midline Chest: symmetric without deformity Cardiac: PMI non-displaced, RRR, normal S1, S2, no gallop or murmur Lung: clear to ausculation and percussion Vascular: all pulses full without bruits  Abdominal: nondistended, nontender, good bowel sounds, no HSM, no bruits Extremities: no cyanosis, clubbing or edema, no sign of DVT, no varicosities  Skin: normal color, no rashes Neuro: alert and oriented x 3,  non-focal Pysch: normal affect Filed Vitals:   12/14/10 0925  BP: 122/68  Pulse: 79  Height: 5' (1.524 m)  Weight: 127 lb (57.607 kg)    EKG Normal sinus rhythm, normal EKG Labs and Studies Reviewed.   Lab Results  Component Value Date   WBC 4.0* 11/25/2010   HGB 12.6 11/25/2010   HCT 38.1 11/25/2010   MCV 102.8* 11/25/2010   PLT 177.0 11/25/2010      Chemistry      Component Value Date/Time   NA 144 11/25/2010 0920   K 4.2 11/25/2010 0920   CL 109 11/25/2010 0920   CO2 28 11/25/2010 0920   BUN 17 11/25/2010 0920   CREATININE 0.6 11/25/2010 0920      Component Value Date/Time   CALCIUM 8.9 11/25/2010 0920   ALKPHOS 60 11/25/2010 0920   AST 18 11/25/2010 0920   ALT 17 11/25/2010 0920   BILITOT 0.5 11/25/2010 0920       Lab Results  Component Value Date   CHOL 263* 11/25/2010   CHOL 145 11/05/2009   CHOL 185 05/04/2009   Lab Results  Component Value Date   HDL 53.30 11/25/2010   HDL 78.29 11/05/2009   HDL 60.30 05/04/2009   Lab Results  Component Value Date   LDLCALC 79 11/05/2009   LDLCALC 104* 05/04/2009   LDLCALC  Value: 44        Total Cholesterol/HDL:CHD Risk Coronary Heart Disease Risk Table                     Men   Women  1/2 Average Risk   3.4   3.3  Average Risk       5.0   4.4  2 X Average Risk   9.6   7.1  3 X Average Risk  23.4   11.0        Use the calculated Patient Ratio above and the CHD Risk Table to determine the patient's CHD Risk.        ATP III CLASSIFICATION (LDL):  <100     mg/dL   Optimal  562-130  mg/dL   Near or Above                    Optimal  130-159  mg/dL   Borderline  865-784  mg/dL   High  >696     mg/dL   Very High  03/08/2009   Lab Results  Component Value Date   TRIG 167.0* 11/25/2010   TRIG 127.0 11/05/2009   TRIG 103.0 05/04/2009   Lab Results  Component Value Date   CHOLHDL 5 11/25/2010   CHOLHDL 4 11/05/2009   CHOLHDL 3 05/04/2009   Lab Results  Component Value Date   HGBA1C 5.3 11/25/2010   Lab Results  Component Value Date   ALT 17 11/25/2010    AST 18 11/25/2010   ALKPHOS 60 11/25/2010   BILITOT 0.5 11/25/2010   Lab Results  Component Value Date   TSH 2.54 11/25/2010

## 2010-12-14 NOTE — Assessment & Plan Note (Signed)
Stable. No change in medical therapy except the back on a statin with a goal LDL of 100 or less.

## 2010-12-14 NOTE — Assessment & Plan Note (Signed)
She could not afford Crestor. Switch to pravastatin 40 mg q.h.s. Followup blood work in 6 weeks.

## 2010-12-14 NOTE — Patient Instructions (Addendum)
Your physician has recommended you make the following change in your medication:  Stop Crestor Start Pravastatin  Your physician recommends that you return for lab work in: 6 weeks for a fasting cholesterol level December 12,2012  Your physician wants you to follow-up in: 1 year with Dr. Daleen Squibb. You will receive a reminder letter in the mail two months in advance. If you don't receive a letter, please call our office to schedule the follow-up appointment.

## 2011-01-03 ENCOUNTER — Telehealth: Payer: Self-pay

## 2011-01-03 NOTE — Telephone Encounter (Signed)
Message copied by Patience Musca on Mon Jan 03, 2011  1:38 PM ------      Message from: Roxy Manns A      Created: Fri Dec 03, 2010  9:14 AM       Please send a copy of her last labs to urology Dr Tobin Chad

## 2011-01-03 NOTE — Telephone Encounter (Signed)
11/25/10 labs faxed to Dr cope [phone].

## 2011-01-06 IMAGING — CR DG ABDOMEN ACUTE W/ 1V CHEST
3 series · 3 of 3 positions shown · non-contrast
Comparison: None available.

CLINICAL DATA: Abdominal pain and vomiting.

ACUTE ABDOMEN SERIES (ABDOMEN 2 VIEW & CHEST 1 VIEW)

[w chest pa]
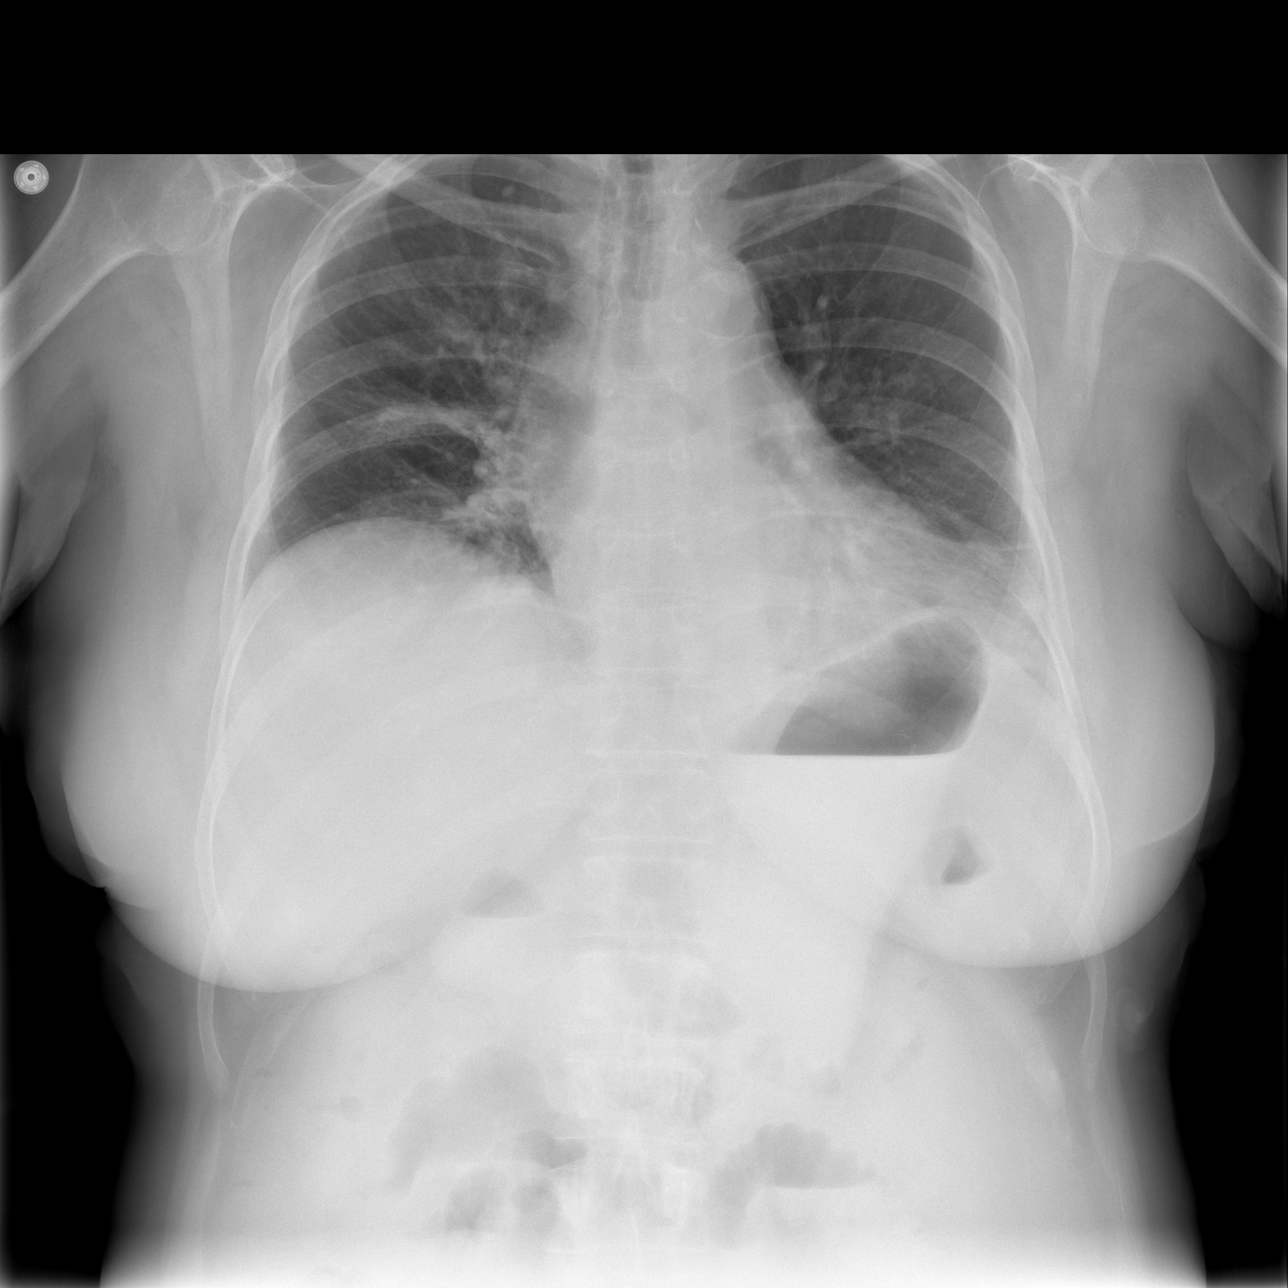

[w abdomen upright]
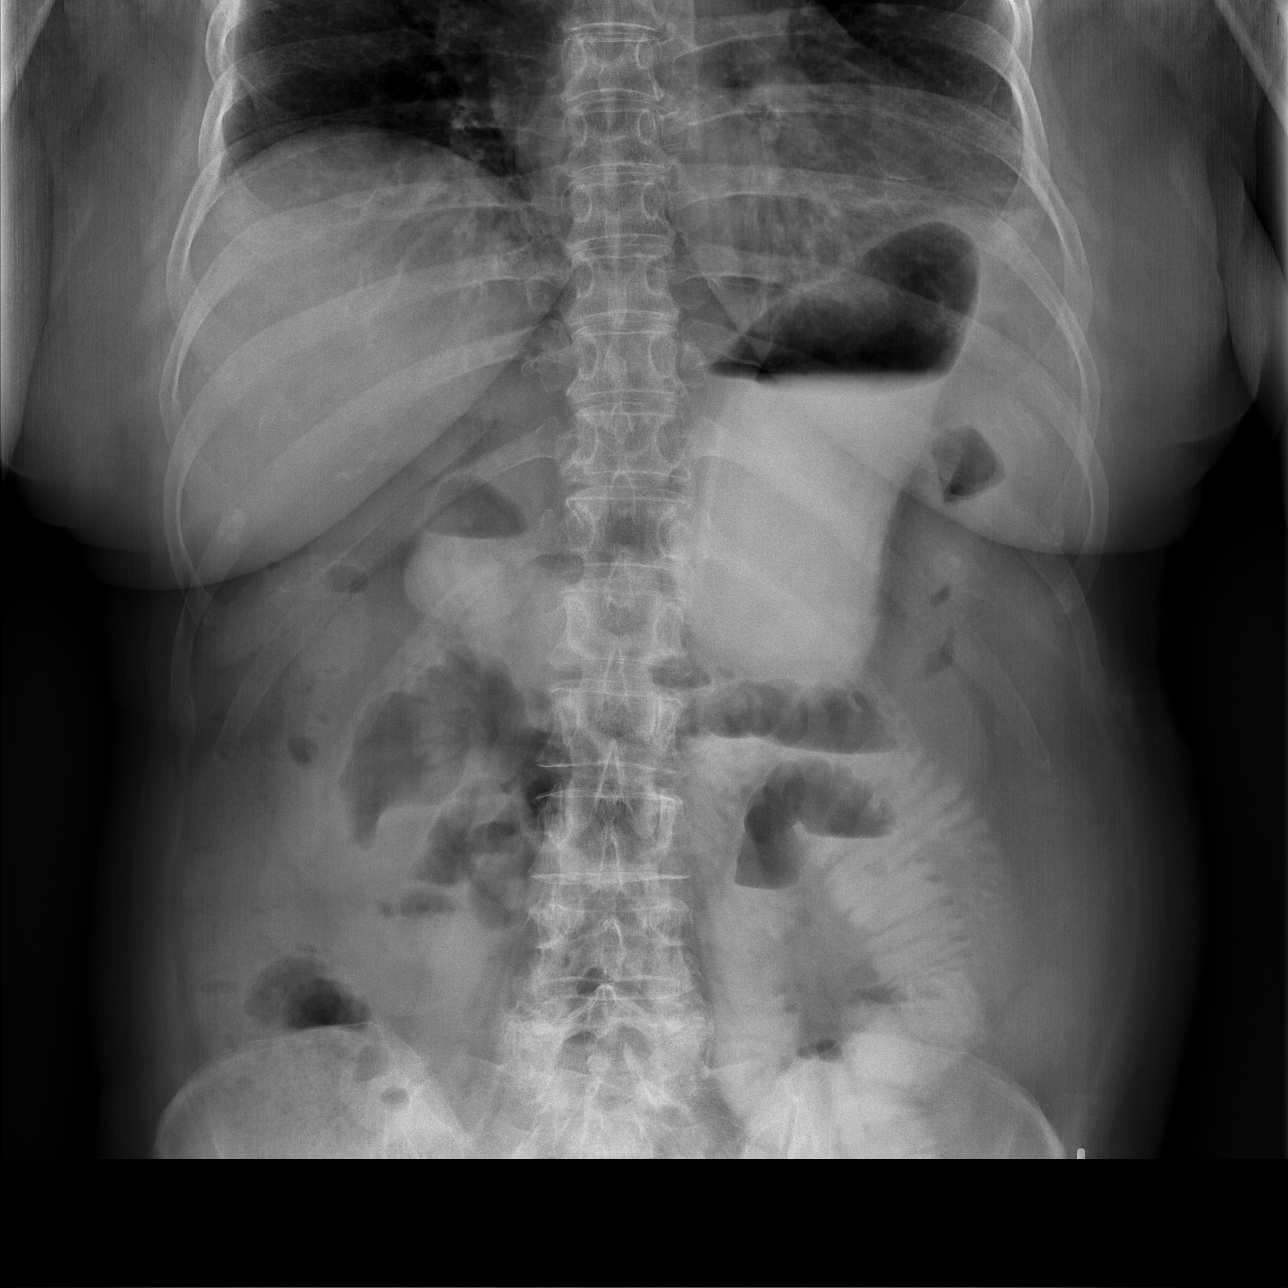

[t abdomen supine]
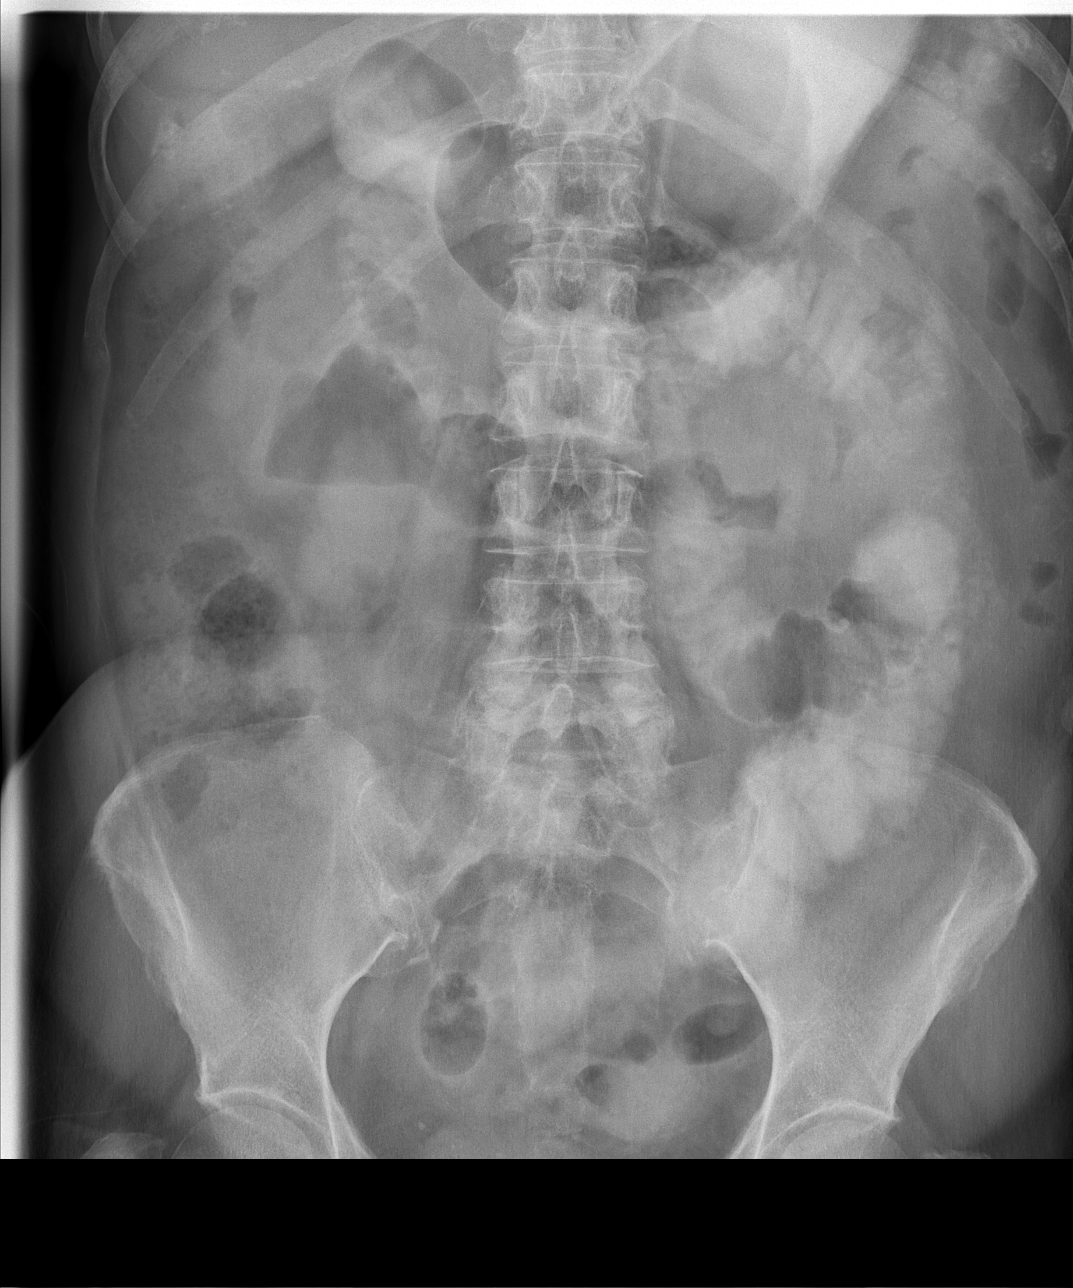

[3 of 3 positions shown; findings below may reference images not displayed]

FINDINGS: Single view of the chest demonstrates low lung volumes
with right mid lung atelectasis.  Patchy airspace opacity in the
left lung base is also likely atelectasis.  Heart size is upper
normal.  Calcified granuloma in the right upper lobe noted.

Two views of the abdomen show no free intraperitoneal air.
Contrast material is seen in loops of small bowel which are mildly
dilated up to 3.5 cm.  There is gas and stool in the colon.
IMPRESSION: 1.  Bowel gas pattern suggestive of partial small bowel
obstruction.
2.  Bilateral atelectasis of the lungs.

## 2011-01-06 IMAGING — CT CT PELVIS W/O CM
2 of 4 series · 17 of 46 positions shown, 19 images · non-contrast
Comparison: Plain films 03/07/2009

CLINICAL DATA: Severe abdominal pain, vomiting.  Elevated lactic
acid.  Low blood pressure, elevated pulse.

CT ABDOMEN AND PELVIS WITHOUT CONTRAST
TECHNIQUE: Multidetector CT imaging of the abdomen and pelvis was
performed following the standard protocol without intravenous
contrast.

[Series 2: abd/pelv w/o 5.0 b31f st · axial · non-contrast · 0.74mm/px · z∈[-412,+43]mm · 14 of 99 slices shown, 16 images]
[im 4/99  soft-tissue]
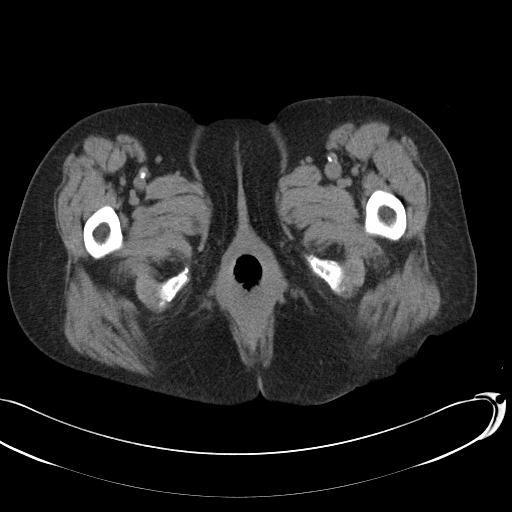
[im 4/99  bone]
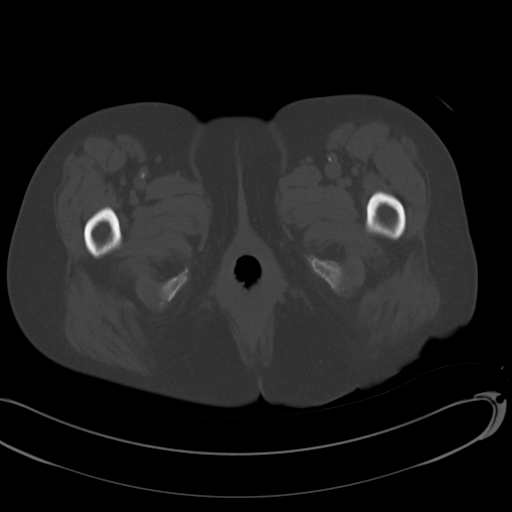
[im 12/99  soft-tissue]
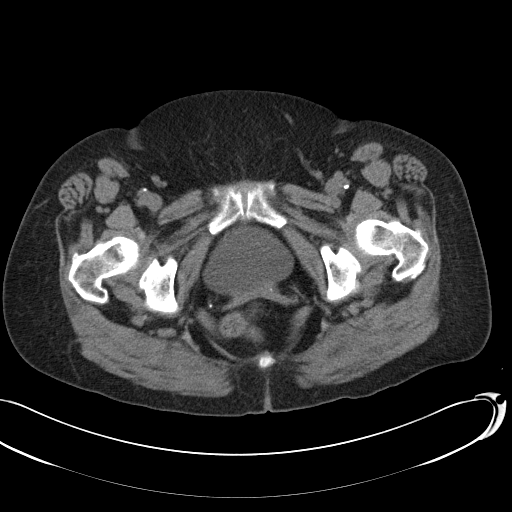
[im 20/99  soft-tissue]
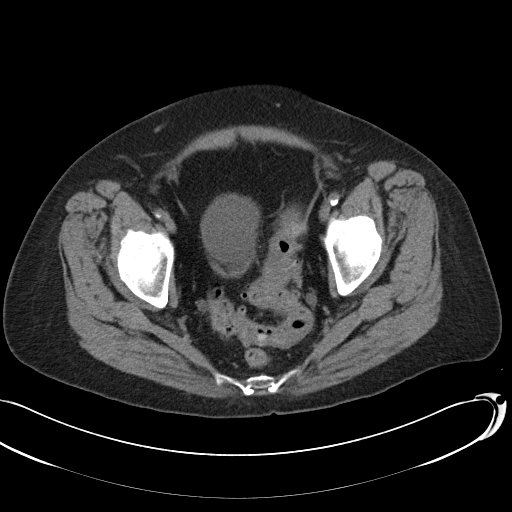
[im 28/99  soft-tissue]
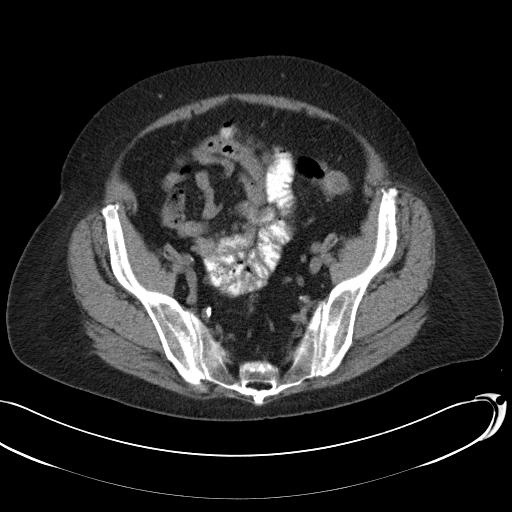
[im 32/99  soft-tissue]
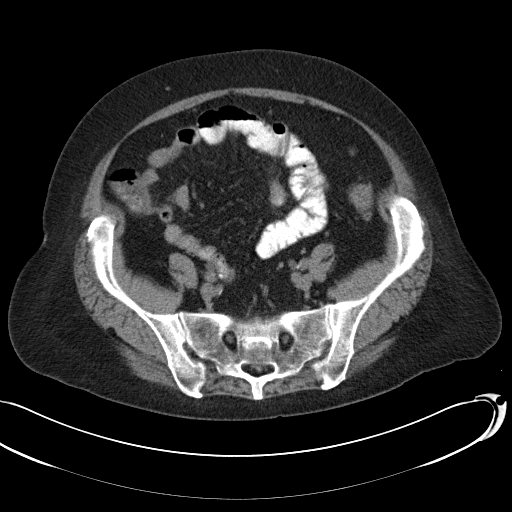
[im 40/99  soft-tissue]
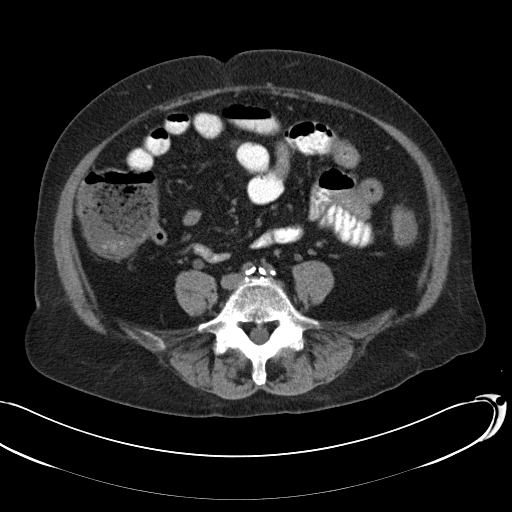
[im 48/99  soft-tissue]
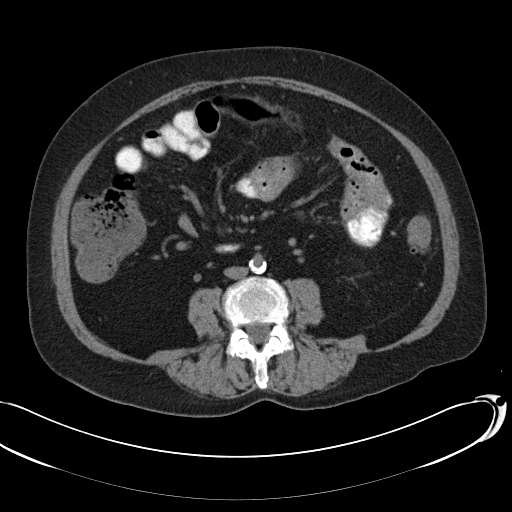
[im 51/99  soft-tissue]
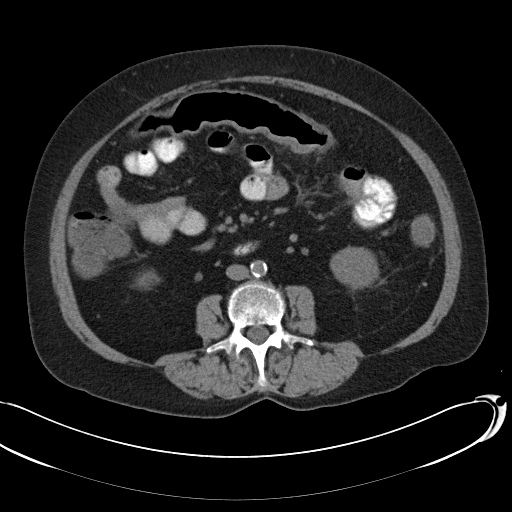
[im 59/99  soft-tissue]
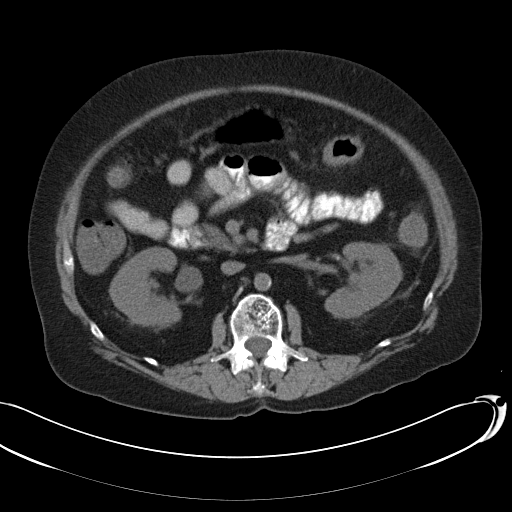
[im 59/99  bone]
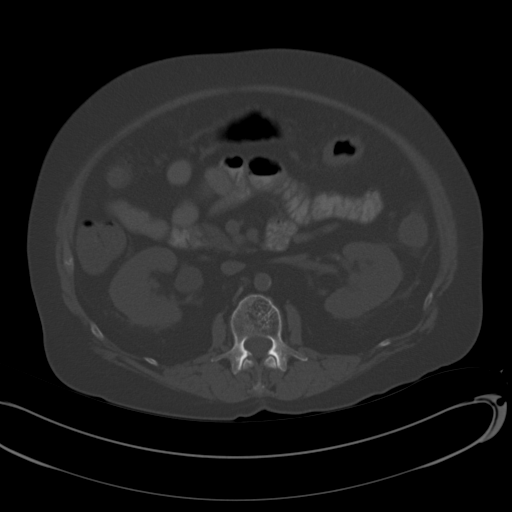
[im 67/99  soft-tissue]
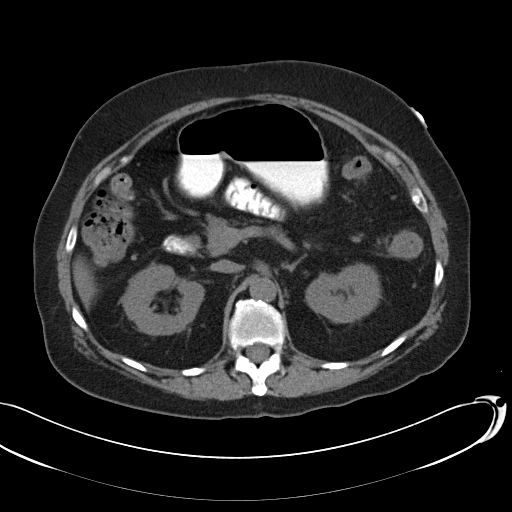
[im 75/99  soft-tissue]
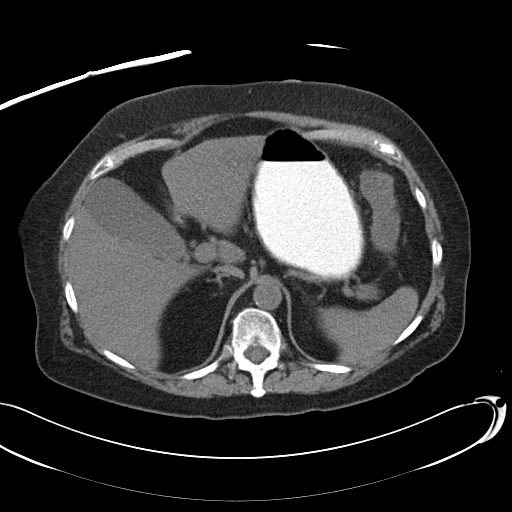
[im 79/99  soft-tissue]
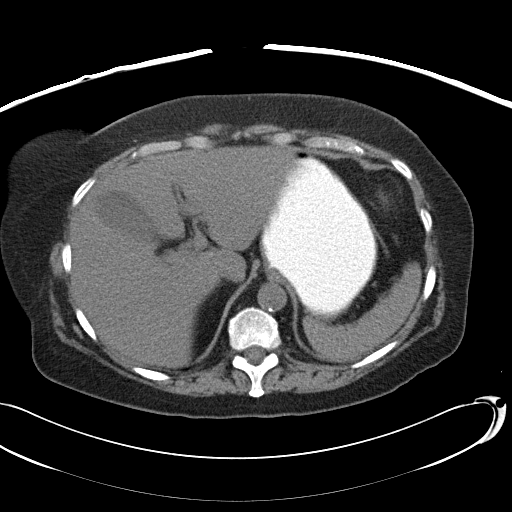
[im 87/99  soft-tissue]
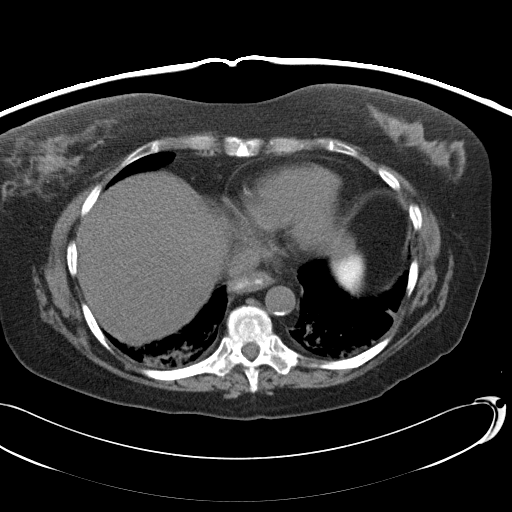
[im 95/99  soft-tissue]
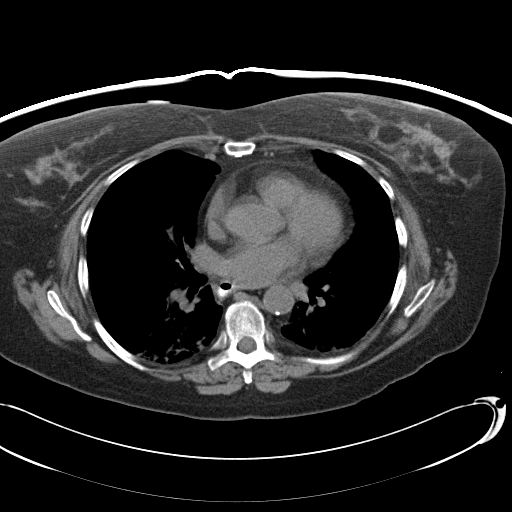

[Series 602: cor · coronal · 0.96mm/px · 3 of 123 slices shown]
[im 41/123  soft-tissue]
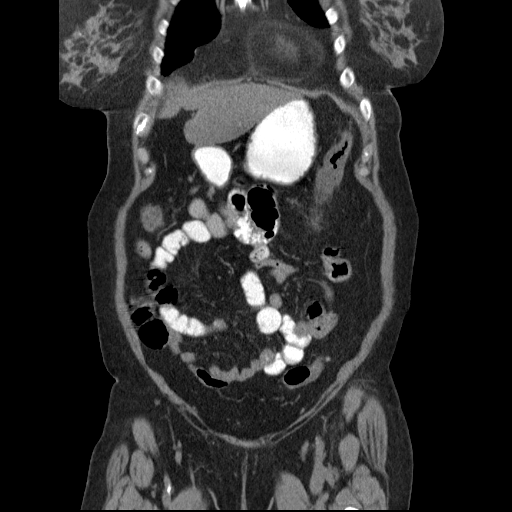
[im 55/123  soft-tissue]
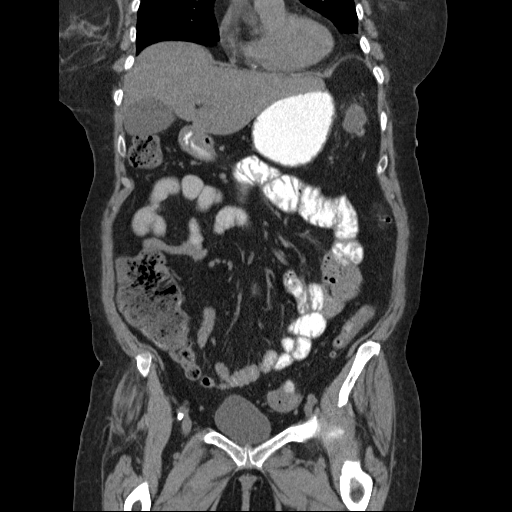
[im 68/123  soft-tissue]
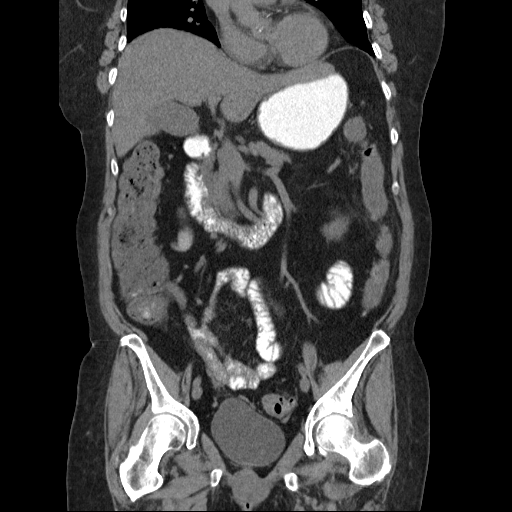

[17 of 46 positions shown; findings below may reference images not displayed]

FINDINGS: Images of the lung bases show atelectasis and contrast,
consistent with gastroesophageal reflux.

No focal abnormalities identified within the liver, spleen,
pancreas, adrenal glands, or kidneys.  There is extrarenal pelvis
on the right.  No evidence for ureteral obstruction.

There is marked abnormality of the transverse and descending colon
where the colonic wall appears thickened.  There is stranding
surrounding the descending and sigmoid colon.  Although there are
diverticula through these portions of the colon, the findings
appear to be related to ischemic, infectious, or inflammatory
colitis rather than diverticulitis.  There is no evidence for
pneumatosis or portal venous gas.

The uterus is absent.  No adnexal mass identified.  There is no
free pelvic fluid or pelvic adenopathy.  The and the heart
degenerative changes are seen in the spine.
IMPRESSION: Thickened transverse and descending colon, consistent with diffuse
colitis.

I discussed the findings with Dr. Puertorico.

## 2011-01-17 DIAGNOSIS — C44320 Squamous cell carcinoma of skin of unspecified parts of face: Secondary | ICD-10-CM | POA: Insufficient documentation

## 2011-02-08 ENCOUNTER — Other Ambulatory Visit: Payer: Medicare Other | Admitting: *Deleted

## 2011-02-09 ENCOUNTER — Other Ambulatory Visit (INDEPENDENT_AMBULATORY_CARE_PROVIDER_SITE_OTHER): Payer: Medicare Other | Admitting: *Deleted

## 2011-02-09 DIAGNOSIS — E785 Hyperlipidemia, unspecified: Secondary | ICD-10-CM

## 2011-02-09 LAB — LIPID PANEL
Cholesterol: 190 mg/dL (ref 0–200)
HDL: 55.9 mg/dL (ref >39.00)
LDL Cholesterol: 104 mg/dL — ABNORMAL HIGH (ref 0–99)
Total CHOL/HDL Ratio: 3
Triglycerides: 152 mg/dL — ABNORMAL HIGH (ref 0.0–149.0)
VLDL: 30.4 mg/dL (ref 0.0–40.0)

## 2011-02-09 LAB — HEPATIC FUNCTION PANEL
ALT: 19 U/L (ref 0–35)
AST: 22 U/L (ref 0–37)
Albumin: 3.7 g/dL (ref 3.5–5.2)
Alkaline Phosphatase: 65 U/L (ref 39–117)
Bilirubin, Direct: 0.1 mg/dL (ref 0.0–0.3)
Total Bilirubin: 0.5 mg/dL (ref 0.3–1.2)
Total Protein: 6.4 g/dL (ref 6.0–8.3)

## 2011-02-10 ENCOUNTER — Encounter: Payer: Self-pay | Admitting: *Deleted

## 2011-03-24 DIAGNOSIS — H4010X0 Unspecified open-angle glaucoma, stage unspecified: Secondary | ICD-10-CM | POA: Diagnosis not present

## 2011-03-28 DIAGNOSIS — Z85828 Personal history of other malignant neoplasm of skin: Secondary | ICD-10-CM | POA: Diagnosis not present

## 2011-03-28 DIAGNOSIS — Z5181 Encounter for therapeutic drug level monitoring: Secondary | ICD-10-CM | POA: Diagnosis not present

## 2011-03-28 DIAGNOSIS — Z9889 Other specified postprocedural states: Secondary | ICD-10-CM | POA: Diagnosis not present

## 2011-03-28 DIAGNOSIS — L121 Cicatricial pemphigoid: Secondary | ICD-10-CM | POA: Diagnosis not present

## 2011-03-28 DIAGNOSIS — L129 Pemphigoid, unspecified: Secondary | ICD-10-CM | POA: Diagnosis not present

## 2011-03-28 DIAGNOSIS — Z79899 Other long term (current) drug therapy: Secondary | ICD-10-CM | POA: Diagnosis not present

## 2011-04-25 DIAGNOSIS — R339 Retention of urine, unspecified: Secondary | ICD-10-CM | POA: Diagnosis not present

## 2011-04-25 DIAGNOSIS — N3946 Mixed incontinence: Secondary | ICD-10-CM | POA: Diagnosis not present

## 2011-04-25 DIAGNOSIS — N3020 Other chronic cystitis without hematuria: Secondary | ICD-10-CM | POA: Diagnosis not present

## 2011-04-25 DIAGNOSIS — L940 Localized scleroderma [morphea]: Secondary | ICD-10-CM | POA: Diagnosis not present

## 2011-05-07 ENCOUNTER — Other Ambulatory Visit: Payer: Self-pay | Admitting: Family Medicine

## 2011-05-09 ENCOUNTER — Other Ambulatory Visit: Payer: Self-pay | Admitting: *Deleted

## 2011-05-09 NOTE — Telephone Encounter (Signed)
Duplicate refill request.

## 2011-05-30 DIAGNOSIS — L820 Inflamed seborrheic keratosis: Secondary | ICD-10-CM | POA: Diagnosis not present

## 2011-05-30 DIAGNOSIS — L129 Pemphigoid, unspecified: Secondary | ICD-10-CM | POA: Diagnosis not present

## 2011-05-30 DIAGNOSIS — Z79899 Other long term (current) drug therapy: Secondary | ICD-10-CM | POA: Diagnosis not present

## 2011-05-30 DIAGNOSIS — L570 Actinic keratosis: Secondary | ICD-10-CM | POA: Diagnosis not present

## 2011-05-30 DIAGNOSIS — L121 Cicatricial pemphigoid: Secondary | ICD-10-CM | POA: Diagnosis not present

## 2011-05-30 DIAGNOSIS — D492 Neoplasm of unspecified behavior of bone, soft tissue, and skin: Secondary | ICD-10-CM | POA: Diagnosis not present

## 2011-06-03 ENCOUNTER — Ambulatory Visit: Payer: Medicare Other | Admitting: Family Medicine

## 2011-06-08 ENCOUNTER — Ambulatory Visit (INDEPENDENT_AMBULATORY_CARE_PROVIDER_SITE_OTHER): Payer: Medicare Other | Admitting: Family Medicine

## 2011-06-08 ENCOUNTER — Encounter: Payer: Self-pay | Admitting: Family Medicine

## 2011-06-08 VITALS — BP 120/70 | HR 75 | Temp 97.6°F | Ht 60.0 in | Wt 119.5 lb

## 2011-06-08 DIAGNOSIS — E119 Type 2 diabetes mellitus without complications: Secondary | ICD-10-CM

## 2011-06-08 DIAGNOSIS — I10 Essential (primary) hypertension: Secondary | ICD-10-CM

## 2011-06-08 DIAGNOSIS — E785 Hyperlipidemia, unspecified: Secondary | ICD-10-CM | POA: Diagnosis not present

## 2011-06-08 MED ORDER — GLUCOSE BLOOD VI STRP: ORAL_STRIP | Status: DC

## 2011-06-08 NOTE — Assessment & Plan Note (Signed)
Now off prednisone - no longer diabetic  Will continue to watch diet and check sugars and keep me updated Stopping metformin now  Eye exam is frequent

## 2011-06-08 NOTE — Patient Instructions (Signed)
Go ahead and stop the metformin- let me know if sugars go up  Still eat healthy diet  Stay as active as you can  Follow up in 6 months with labs prior

## 2011-06-08 NOTE — Progress Notes (Signed)
Subjective:    Patient ID: Victoria Allison, female    DOB: [birth year], 71 y.o.   MRN: [id number]  HPI Here for f/u of chronic health problems   Finished her prednisone last week  Gums are not sore today Is on cytoxan  Was told she needs to see GI-- is worried about payment - may not be able to afford it  Wants her to have endoscopy  Also had a few skin cancers treated   Wt is down 8 lb with bmi of 23 Less appetite- did stop the prednisone Eats smaller portions too   bp is 120/70    Today  (at home was low one time 80/46) No cp or palpitations or headaches or edema  No side effects to medicines    Diabetes Home sugar results - are great at home - ams 79-89, during the day pp is around 106  DM diet - is excellent Exercise - has been doing some work outside  [employer] at all  A1C last   Lab Results  Component Value Date   HGBA1C 5.3 11/25/2010    No problems with medications - low dose metformin  Renal protection- is on ace inhibitor Last eye exam -- has those frequently -- 8 weeks ago- Dr Dingeldein   Cholesterol  Lab Results  Component Value Date   CHOL 190 02/09/2011   HDL 55.90 02/09/2011   LDLCALC 104* 02/09/2011   LDLDIRECT 184.0 11/25/2010   TRIG 152.0* 02/09/2011   CHOLHDL 3 02/09/2011   is on pravachol and diet  Is very good about a low fat diet   Patient Active Problem List  Diagnoses  . Hyperglycemia  . HYPERLIPIDEMIA  . HYPERTENSION  . CORONARY ARTERY DISEASE  . SUPRAVENTRICULAR TACHYCARDIA  . ALLERGIC RHINITIS  . GERD  . HIATAL HERNIA  . PEMPHIGOID  . HAIR LOSS  . ARTHRITIS, SHOULDER  . LEG PAIN, CHRONIC  . INSOMNIA  . SHORTNESS OF BREATH  . URINARY INCONTINENCE  . Nonspecific (abnormal) findings on radiological and other examination of body structure  . COLONIC POLYPS, HX OF  . HX, URINARY INFECTION  . POSTMENOPAUSAL STATUS  . NONSPECIFIC ABNORM FIND RAD&OTH EXAM LUNG FIELD  . TRANSAMINASES, SERUM, ELEVATED  . Other screening  mammogram   Past Medical History  Diagnosis Date  . Allergic rhinitis   . GERD (gastroesophageal reflux disease)   . CAD (coronary artery disease)   . Hyperlipemia   . Hypertension   . Urinary incontinence     OVERACTIVE BLADDER  . Lichen sclerosus 05/09    (DX BY GYN AFTER ABN PAP)  . Pemphigoid, cicatricial     D/O AFFECTING EYES, MOUTH, THROAT NOW (DX [facility])  . Elevated LFTs   . History of hepatitis    Past Surgical History  Procedure Date  . Oophorectomy   . Angioplasty     LAD  . Nose surgery     Biopsy  . Cataract extraction   . Cardiac electrophysiology mapping and ablation    History  Substance Use Topics  . Smoking status: Former Smoker    Quit date: 02/29/1968  . Smokeless tobacco: Not on file  . Alcohol Use: No   Family History  Problem Relation Age of Onset  . Stroke Mother     CVA  . Alzheimer's disease Mother   . Cancer Father     lung  . Hypertension Brother   . Cancer Son     esoph   Allergies  Allergen  Reactions  . Aspirin     REACTION: bleeds too easily-  . Atorvastatin     REACTION: elevated LFT's  . Hydrocodone     REACTION: makes her loopy  . Tetanus Toxoid     REACTION: rash   Current Outpatient Prescriptions on File Prior to Visit  Medication Sig Dispense Refill  . acetaminophen (TYLENOL) 325 MG tablet Take 325 mg by mouth. OTC as directed       . Bimatoprost (LUMIGAN) 0.01 % SOLN One drop each eye daily       . cyclophosphamide (CYTOXAN) 50 MG tablet Take 100 mg by mouth daily. Give on an empty stomach 1 hour before or 2 hours after meals.        Marland Kitchen glucose monitoring kit (FREESTYLE) monitoring kit 1 each. To check sugar once daily and as needed for diabetes 250.0       . lisinopril (PRINIVIL,ZESTRIL) 10 MG tablet Take 1 tablet (10 mg total) by mouth daily.  90 tablet  3  . omeprazole (PRILOSEC) 20 MG capsule TAKE ONE CAPSULE BY MOUTH EVERY MORNING  90 capsule  0  . oxybutynin (DITROPAN XL) 15 MG 24 hr tablet Take 15 mg by mouth  daily.        . pravastatin (PRAVACHOL) 40 MG tablet Take 1 tablet (40 mg total) by mouth daily.  90 tablet  3  . prednisoLONE acetate (OMNIPRED) 1 % ophthalmic suspension One drop in each eye daily             Review of Systems Review of Systems  Constitutional: Negative for fever, appetite change, fatigue and unexpected weight change.  Eyes: Negative for pain and visual disturbance.  Respiratory: Negative for cough and shortness of breath.   Cardiovascular: Negative for cp or palpitations    Gastrointestinal: Negative for nausea, diarrhea and constipation.  Genitourinary: Negative for urgency and frequency.  Skin: Negative for pallor or rash   Neurological: Negative for weakness, light-headedness, numbness and headaches.  Hematological: Negative for adenopathy. Does not bruise/bleed easily.  Psychiatric/Behavioral: Negative for dysphoric mood. The patient is not nervous/anxious.         Objective:   Physical Exam  Constitutional: She appears well-developed and well-nourished. No distress.  HENT:  Head: Normocephalic and atraumatic.  Eyes: Conjunctivae and EOM are normal. Pupils are equal, round, and reactive to light. No scleral icterus.  Neck: Normal range of motion. Neck supple. No JVD present. No thyromegaly present.  Cardiovascular: Normal rate, regular rhythm, normal heart sounds and intact distal pulses.   Pulmonary/Chest: Effort normal and breath sounds normal. No respiratory distress. She has no wheezes.  Abdominal: Soft. Bowel sounds are normal. She exhibits no distension. There is no tenderness.  Musculoskeletal: Normal range of motion. She exhibits no edema and no tenderness.  Lymphadenopathy:    She has no cervical adenopathy.  Neurological: She is alert. She has normal reflexes. No cranial nerve deficit. She exhibits normal muscle tone. Coordination normal.  Skin: Skin is warm and dry. No rash noted. No erythema. No pallor.  Psychiatric: She has a normal mood and  affect.          Assessment & Plan:

## 2011-06-08 NOTE — Assessment & Plan Note (Signed)
This was well controlled with statin and diet in dec No changes Rev low sat fat diet

## 2011-06-08 NOTE — Assessment & Plan Note (Signed)
bp in fair control at this time  No changes needed  Disc lifstyle change with low sodium diet and exercise   

## 2011-06-23 DIAGNOSIS — E119 Type 2 diabetes mellitus without complications: Secondary | ICD-10-CM | POA: Diagnosis not present

## 2011-06-24 DIAGNOSIS — L570 Actinic keratosis: Secondary | ICD-10-CM | POA: Diagnosis not present

## 2011-06-24 DIAGNOSIS — C44529 Squamous cell carcinoma of skin of other part of trunk: Secondary | ICD-10-CM | POA: Diagnosis not present

## 2011-08-01 DIAGNOSIS — L570 Actinic keratosis: Secondary | ICD-10-CM | POA: Diagnosis not present

## 2011-08-01 DIAGNOSIS — Z85828 Personal history of other malignant neoplasm of skin: Secondary | ICD-10-CM | POA: Insufficient documentation

## 2011-08-01 DIAGNOSIS — L121 Cicatricial pemphigoid: Secondary | ICD-10-CM | POA: Diagnosis not present

## 2011-08-01 DIAGNOSIS — Z79899 Other long term (current) drug therapy: Secondary | ICD-10-CM | POA: Diagnosis not present

## 2011-08-02 ENCOUNTER — Other Ambulatory Visit: Payer: Self-pay | Admitting: Family Medicine

## 2011-08-02 NOTE — Telephone Encounter (Signed)
Done

## 2011-08-24 ENCOUNTER — Encounter: Payer: Self-pay | Admitting: Internal Medicine

## 2011-08-24 ENCOUNTER — Ambulatory Visit (INDEPENDENT_AMBULATORY_CARE_PROVIDER_SITE_OTHER): Payer: Medicare Other | Admitting: Family Medicine

## 2011-08-24 ENCOUNTER — Encounter: Payer: Self-pay | Admitting: Family Medicine

## 2011-08-24 VITALS — BP 131/74 | HR 86 | Temp 98.2°F | Wt 112.8 lb

## 2011-08-24 DIAGNOSIS — K222 Esophageal obstruction: Secondary | ICD-10-CM | POA: Insufficient documentation

## 2011-08-24 DIAGNOSIS — R1310 Dysphagia, unspecified: Secondary | ICD-10-CM | POA: Diagnosis not present

## 2011-08-24 NOTE — Assessment & Plan Note (Addendum)
Progressive esophageal dysphagia with noted weight loss.  fmhx stomach cancer.  H/o cicatrial pemphigoid.  On cytoxan. Will refer to GI for sooner evaluation.  Will likely need EGD to r/o malignancy, eval for stricture, other cause of dysphagia. States h/o GERD but sxs well controlled on omeprazole. Will start with barium swallow to eval achalasia.

## 2011-08-24 NOTE — Addendum Note (Signed)
Addended by: Eustaquio Boyden on: 08/24/2011 01:16 PM   Modules accepted: Orders

## 2011-08-24 NOTE — Patient Instructions (Signed)
Continue soft foods. I'm sorry you're having trouble with swallowing. Pass by [facility]'s office to schedule referral to GI doctor. Let us know sooner if worsening symptoms.

## 2011-08-24 NOTE — Progress Notes (Signed)
  Subjective:    Patient ID: Victoria Allison, female    DOB: [birth year], 71 y.o.   MRN: [id number]  HPI CC: dysphagia  Noted worsening dysphagia over last 5 months.  Trouble swallowing to mostly solids, sometimes liquids.  Happens a few seconds after swallow.  Decreased appetite.  Weight loss noted.  April choked on piece of meat.  This morning choked on soft cereal.  Becoming fearful of eating.  Denies fevers/chills, abd pain, n/v, d/c, blood in stool, early satiety, night sweats.  Sees Dr. 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  Was prior on prednisone course.  Referred to [facility].  Had schedule to see [facility] doctor, but missed appt because became nervous as it was her son's death's anniversary.  Has been unable to reschedule with [facility] - has not heard back for over a month to schedule new pt intake.  Really wants to be evaluated for dysphagia soon.   Wt Readings from Last 3 Encounters:  08/24/11 112 lb 12 oz (51.143 kg)  06/08/11 119 lb 8 oz (54.205 kg)  12/14/10 127 lb (57.607 kg)  losing weight - 150lb to 112 lbs in last few years.  son passed away from stomach cancer at age 9yo. Pt is nonsmoker.  Quit 1975.  Past Medical History  Diagnosis Date  . Allergic rhinitis   . GERD (gastroesophageal reflux disease)   . CAD (coronary artery disease)   . Hyperlipemia   . Hypertension   . Urinary incontinence     OVERACTIVE BLADDER  . Lichen sclerosus 05/09    (DX BY GYN AFTER ABN PAP)  . Pemphigoid, cicatricial     D/O AFFECTING EYES, MOUTH, THROAT NOW (DX [facility])  . Elevated LFTs   . History of hepatitis     Review of Systems     Objective:   Physical Exam  Nursing note and vitals reviewed. Constitutional: She appears well-developed and well-nourished. No distress.  HENT:  Head: Normocephalic and atraumatic.  Mouth/Throat: Oropharynx is clear and moist. No oropharyngeal exudate.  Eyes: Conjunctivae and EOM are normal.  Pupils are equal, round, and reactive to light.  Neck: Normal range of motion. Neck supple. No thyromegaly present.  Cardiovascular: Normal rate, regular rhythm, normal heart sounds and intact distal pulses.   No murmur heard. Pulmonary/Chest: Effort normal. No respiratory distress. She has no wheezes. She has no rales.  Abdominal: Soft. Bowel sounds are normal. She exhibits no distension and no mass. There is no tenderness. There is no rebound and no guarding.  Lymphadenopathy:    She has no cervical adenopathy.  Skin: Skin is warm and dry. No rash noted.  Psychiatric: She has a normal mood and affect.      Assessment & Plan:

## 2011-08-26 ENCOUNTER — Ambulatory Visit (INDEPENDENT_AMBULATORY_CARE_PROVIDER_SITE_OTHER): Payer: Medicare Other | Admitting: Internal Medicine

## 2011-08-26 ENCOUNTER — Encounter: Payer: Self-pay | Admitting: Internal Medicine

## 2011-08-26 VITALS — BP 128/64 | HR 76 | Ht 60.0 in | Wt 115.0 lb

## 2011-08-26 DIAGNOSIS — R1314 Dysphagia, pharyngoesophageal phase: Secondary | ICD-10-CM

## 2011-08-26 DIAGNOSIS — L129 Pemphigoid, unspecified: Secondary | ICD-10-CM | POA: Diagnosis not present

## 2011-08-26 DIAGNOSIS — R1319 Other dysphagia: Secondary | ICD-10-CM

## 2011-08-26 DIAGNOSIS — R1310 Dysphagia, unspecified: Secondary | ICD-10-CM

## 2011-08-26 NOTE — Assessment & Plan Note (Signed)
Linked to dysphagia

## 2011-08-26 NOTE — Progress Notes (Signed)
Subjective:    Patient ID: Victoria Allison, female    DOB: [birth year], 71 y.o.   MRN: [id number] Referred by: Eustaquio Boyden, MD HPI This is a delightful 71 year old married white woman here with her husband because of problem swallowing. She saw Dr. Sharen Hones in the absence of Dr. Milinda Antis, and was referred. She reports a several month history, if not longer of intermittent solid food dysphagia that is now occurring on a daily basis. She has a history of pemphigoid and sees a Armed forces operational officer at [facility]. She was referred to gastroenterology there, in February of this year but because of an anxiety situation related to the anniversary of her sons death, she did not feel like she couldn't make that appointment. She has lost more than 20 pounds over the past several months, probably 30 or 40. She's not eating as well. Liquids are generally okay, but she does definitely have dysphagia to many solid foods. She has had that regurgitate food bolus at times as well. She has never had this problem previously. She has had pemphigoid for 15 years, mostly manifested by mouth ulcerations and some in the throat. She is gone on multiple immunosuppressants and is currently on 75 mg of Cytoxan daily. She does have a history of heartburn and reflux but daily omeprazole controls that. She has concomitant loss of appetite, she is generally afraid to eat because of the dysphagia. She has recently begun drinking Ensure.  Chart review does show that she had been on prednisone in late 2012. Allergies  Allergen Reactions  . Aspirin     REACTION: bleeds too easily-  . Atorvastatin     REACTION: elevated LFT's  . Hydrocodone     REACTION: makes her loopy  . Tetanus Toxoid     REACTION: rash   Outpatient Prescriptions Prior to Visit  Medication Sig Dispense Refill  . acetaminophen (TYLENOL) 325 MG tablet Take 325 mg by mouth. OTC as directed       . Bimatoprost (LUMIGAN) 0.01 % SOLN One drop each eye daily       .  cyclophosphamide (CYTOXAN) 25 MG tablet Take 3 tablets (75 mg total) by mouth daily. Give on an empty stomach 1 hour before or 2 hours after meals.      Marland Kitchen glucose blood test strip To check sugar once daily and as needed for diabetes 250.0  100 each  11  . glucose monitoring kit (FREESTYLE) monitoring kit 1 each. To check sugar once daily and as needed for diabetes 250.0       . lisinopril (PRINIVIL,ZESTRIL) 10 MG tablet Take 1 tablet (10 mg total) by mouth daily.  90 tablet  3  . omeprazole (PRILOSEC) 20 MG capsule TAKE ONE CAPSULE BY MOUTH EVERY MORNING  90 capsule  1  . oxybutynin (DITROPAN XL) 15 MG 24 hr tablet Take 15 mg by mouth daily.        . pravastatin (PRAVACHOL) 40 MG tablet Take 1 tablet (40 mg total) by mouth daily.  90 tablet  3  . prednisoLONE acetate (OMNIPRED) 1 % ophthalmic suspension One drop in each eye daily        Past Medical History  Diagnosis Date  . Allergic rhinitis   . GERD (gastroesophageal reflux disease)   . CAD (coronary artery disease)   . Hyperlipemia   . Hypertension   . Urinary incontinence     OVERACTIVE BLADDER  . Lichen sclerosus 05/09    (DX BY GYN AFTER ABN PAP)  .  Pemphigoid, cicatricial     D/O AFFECTING EYES, MOUTH, THROAT NOW (DX [facility])  . Elevated LFTs   . History of hepatitis   . DM (diabetes mellitus)   . Colon polyp   . Diverticulosis     sigmoid and descending colon  . Internal hemorrhoids    Past Surgical History  Procedure Date  . Oophorectomy   . Angioplasty     LAD  . Nose surgery     Biopsy  . Cataract extraction   . Cardiac electrophysiology mapping and ablation   . Colonoscopy   . Abdominal hysterectomy    History   Social History  . Marital Status: Married    Spouse Name: N/A    Number of Children: 2  . Years of Education: N/A   Occupational History  . Retired    Social History Main Topics  . Smoking status: Former Smoker    Quit date: 02/29/1968  . Smokeless tobacco: Never Used  . Alcohol Use: No  .  Drug Use: No    Social History Narrative   Husband is fighting cancer. Exercises on stationary bike/walksDaily caffeine    Family History  Problem Relation Age of Onset  . Stroke Mother     CVA  . Alzheimer's disease Mother   . Lung cancer Father   . Hypertension Brother   . Esophageal cancer Son 35  . Colon cancer Neg Hx   . Stomach cancer Son 50     Review of Systems Gum recession, tooth decay, insomnia, excessive urination, fatigued. Otherwise as per history of present illness, or O. other review of systems negative.    Objective:   Physical Exam General:  Well-developed, well-nourished and in no acute distress Eyes:  anicteric. ENT:   Mouth and posterior pharynx notable for gum recession, missing tooth Neck:   supple w/o thyromegaly or mass.  Lungs: Clear to auscultation bilaterally. Heart:  S1S2, no rubs, murmurs, gallops. Abdomen:  soft, non-tender, no hepatosplenomegaly, hernia, or mass and BS+.  Lymph:  no cervical or supraclavicular adenopathy. Extremities:   no edema Skin   no rash. Neuro:  A&O x 3.  Psych:  appropriate mood and  Affect.   Data Reviewed: Wt Readings from Last 3 Encounters:  08/26/11 115 lb (52.164 kg)  08/24/11 112 lb 12 oz (51.143 kg)  06/08/11 119 lb 8 oz (54.205 kg)   Lab Results  Component Value Date   WBC 4.0* 11/25/2010   HGB 12.6 11/25/2010   HCT 38.1 11/25/2010   MCV 102.8* 11/25/2010   PLT 177.0 11/25/2010   [facility] dermatology notes, primary care notes     Assessment & Plan:   1. Esophageal dysphagia   2. Pemphigoid    She may have a stricture related to pemphigoid. I believe these are usually proximal, they can be difficult to dilate. She could have Candida though she does not have odynophagia which is more typical of that.  Workup to start with barium swallow.  Likely upper endoscopy with dilation pending the barium swallow results. I've explained the risks benefits and indications of this to the patient and her husband and  they understand the plans.  We'll call the results of the barium swallow and next steps.  I appreciate the opportunity to care for this patient.   CC: Roxy Manns, MD

## 2011-08-26 NOTE — Patient Instructions (Addendum)
You have been set up for a Barium Swallow test at [facility] on July 2nd at 12:30pm , please arrive at Radiology at 12:15pm.    Please try to drink as much ensure as you can and make sure the foods you consume are of baby food consistency.

## 2011-08-30 ENCOUNTER — Ambulatory Visit (HOSPITAL_COMMUNITY)
Admission: RE | Admit: 2011-08-30 | Discharge: 2011-08-30 | Disposition: A | Discharge status: Home / Self Care | Payer: Medicare Other | Source: Ambulatory Visit | Attending: Family Medicine | Admitting: Family Medicine

## 2011-08-30 DIAGNOSIS — R1310 Dysphagia, unspecified: Secondary | ICD-10-CM | POA: Insufficient documentation

## 2011-08-30 DIAGNOSIS — K219 Gastro-esophageal reflux disease without esophagitis: Secondary | ICD-10-CM | POA: Diagnosis not present

## 2011-09-02 NOTE — Progress Notes (Signed)
Quick Note:  Let patient know she has an esophageal stricture. Please set her up for EGD and esophageal dilation [facility] next week. I had advised her dilation likely after barium swallow ______

## 2011-09-07 ENCOUNTER — Ambulatory Visit (AMBULATORY_SURGERY_CENTER): Payer: Medicare Other | Admitting: *Deleted

## 2011-09-07 VITALS — Ht 60.0 in | Wt 115.0 lb

## 2011-09-07 DIAGNOSIS — K222 Esophageal obstruction: Secondary | ICD-10-CM

## 2011-09-09 ENCOUNTER — Ambulatory Visit (AMBULATORY_SURGERY_CENTER): Payer: Medicare Other | Admitting: Internal Medicine

## 2011-09-09 ENCOUNTER — Encounter: Payer: Self-pay | Admitting: Internal Medicine

## 2011-09-09 VITALS — BP 120/77 | HR 77 | Temp 96.1°F | Resp 16 | Ht 60.0 in | Wt 115.0 lb

## 2011-09-09 DIAGNOSIS — R1310 Dysphagia, unspecified: Secondary | ICD-10-CM

## 2011-09-09 DIAGNOSIS — K222 Esophageal obstruction: Secondary | ICD-10-CM | POA: Diagnosis not present

## 2011-09-09 MED ORDER — SODIUM CHLORIDE 0.9 % IV SOLN: 500.0000 mL | INTRAVENOUS | Status: DC

## 2011-09-09 NOTE — Op Note (Signed)
[facility] [address] [place], [place]  [address]  ENDOSCOPY PROCEDURE REPORT  PATIENT:  Victoria Allison, Victoria Allison  MR#:  [id number] BIRTHDATE:  [birth year], 71 yrs. old  GENDER:  female  ENDOSCOPIST:  Iva Boop, MD, [facility]  Referred by:  Marne A. Milinda Antis, M.D.  PROCEDURE DATE:  09/09/2011 PROCEDURE:  EGD with balloon dilatation ASA CLASS:  Class III INDICATIONS:  1) dilation of esophageal stricture in setting of pemphigoid  MEDICATIONS:   These medications were titrated to patient response per physician's verbal order, MAC sedation, administered by CRNA, propofol (Diprivan) 200 mg IV TOPICAL ANESTHETIC:  none  DESCRIPTION OF PROCEDURE:   After the risks benefits and alternatives of the procedure were thoroughly explained, informed consent was obtained.  The LB [id number]-H180 [id number] endoscope was introduced through the mouth and advanced to the second portion of the duodenum, without limitations.  The instrument was slowly withdrawn as the mucosa was carefully examined. <<PROCEDUREIMAGES>>  Multiple rings were seen in the esophagus. in the distal esophagus.  The examination was otherwise normal.    Dilation was then performed at the mid esophagus  1) Dilator:  Balloon  Size(s):  15 mm Resistance:  moderate  Heme:  yes Appearance:  satisfactory The more proximal rings were dilated with appropriate mucosal defect but ring at GE junction not dilated. Biopsy forceps used to disrupt that ring.  COMPLICATIONS:  None  ENDOSCOPIC IMPRESSION: 1) Rings, multiple in the distal esophagus dilated to 15 mm and one ring disrupted with forceps 2) Otherwise normal examination. RECOMMENDATIONS: 1) Clear liquids only until 1 PM then soft foods 2) try regular consistency foods tomorrow if ok. 3) Call Dr. Leone Payor in 1 week with symptom update to see if repeat dilation needed.  Iva Boop, MD, [facility]  CC:  Judy Pimple, MD and The Patient  n. eSIGNED:   Iva Boop at  09/09/2011 10:48 AM  Emelia Salisbury, [id number]

## 2011-09-09 NOTE — Progress Notes (Signed)
YOU HAD AN ENDOSCOPIC PROCEDURE TODAY AT THE [facility]: Refer to the procedure report that was given to you for any specific questions about what was found during the examination.  If the procedure report does not answer your questions, please call your gastroenterologist to clarify.  If you requested that your care partner not be given the details of your procedure findings, then the procedure report has been included in a sealed envelope for you to review at your convenience later.  YOU SHOULD EXPECT: Some feelings of bloating in the abdomen. Passage of more gas than usual.  Walking can help get rid of the air that was put into your GI tract during the procedure and reduce the bloating. If you had a lower endoscopy (such as a colonoscopy or flexible sigmoidoscopy) you may notice spotting of blood in your stool or on the toilet paper. If you underwent a bowel prep for your procedure, then you may not have a normal bowel movement for a few days.  DIET: Your first meal following the procedure should be a light meal and then it is ok to progress to your normal diet.  A half-sandwich or bowl of soup is an example of a good first meal.  Heavy or fried foods are harder to digest and may make you feel nauseous or bloated.  Likewise meals heavy in dairy and vegetables can cause extra gas to form and this can also increase the bloating.  Drink plenty of fluids but you should avoid alcoholic beverages for 24 hours.  ACTIVITY: Your care partner should take you home directly after the procedure.  You should plan to take it easy, moving slowly for the rest of the day.  You can resume normal activity the day after the procedure however you should NOT DRIVE or use heavy machinery for 24 hours (because of the sedation medicines used during the test).    SYMPTOMS TO REPORT IMMEDIATELY: A gastroenterologist can be reached at any hour.  During normal business hours, 8:30 AM to 5:00 PM Monday through Friday,  call [phone].  After hours and on weekends, please call the GI answering service at [phone] who will take a message and have the physician on call contact you.  Following upper endoscopy (EGD)  Vomiting of blood or coffee ground material  New chest pain or pain under the shoulder blades  Painful or persistently difficult swallowing  New shortness of breath  Fever of 100F or higher  Black, tarry-looking stools  FOLLOW UP: If any biopsies were taken you will be contacted by phone or by letter within the next 1-3 weeks.  Call your gastroenterologist if you have not heard about the biopsies in 3 weeks.  Our staff will call the home number listed on your records the next business day following your procedure to check on you and address any questions or concerns that you may have at that time regarding the information given to you following your procedure. This is a courtesy call and so if there is no answer at the home number and we have not heard from you through the emergency physician on call, we will assume that you have returned to your regular daily activities without incident.  SIGNATURES/CONFIDENTIALITY: You and/or your care partner have signed paperwork which will be entered into your electronic medical record.  These signatures attest to the fact that that the information above on your After Visit Summary has been reviewed and is understood.  Full responsibility of   the confidentiality of this discharge information lies with you and/or your care-partner. 

## 2011-09-09 NOTE — Patient Instructions (Addendum)
The esophagus was dilated. This should help your swallowing but you may need another dilation.  Please follow the special diet today.  Call Dr. Marvell Fuller office in 1 week to let him know how the swallowing is going after this dilation. Leave a message with his nurse Lavonna Rua).  Thank you for choosing me and [facility].  Iva Boop, MD, FACG YOU HAD AN ENDOSCOPIC PROCEDURE TODAY AT THE [facility]: Refer to the procedure report that was given to you for any specific questions about what was found during the examination.  If the procedure report does not answer your questions, please call your gastroenterologist to clarify.  If you requested that your care partner not be given the details of your procedure findings, then the procedure report has been included in a sealed envelope for you to review at your convenience later.  YOU SHOULD EXPECT: Some feelings of bloating in the abdomen. Passage of more gas than usual.  Walking can help get rid of the air that was put into your GI tract during the procedure and reduce the bloating. If you had a lower endoscopy (such as a colonoscopy or flexible sigmoidoscopy) you may notice spotting of blood in your stool or on the toilet paper. If you underwent a bowel prep for your procedure, then you may not have a normal bowel movement for a few days.  DIET: Your first meal following the procedure should be a light meal and then it is ok to progress to your normal diet.  A half-sandwich or bowl of soup is an example of a good first meal.  Heavy or fried foods are harder to digest and may make you feel nauseous or bloated.  Likewise meals heavy in dairy and vegetables can cause extra gas to form and this can also increase the bloating.  Drink plenty of fluids but you should avoid alcoholic beverages for 24 hours.  ACTIVITY: Your care partner should take you home directly after the procedure.  You should plan to take it easy, moving slowly  for the rest of the day.  You can resume normal activity the day after the procedure however you should NOT DRIVE or use heavy machinery for 24 hours (because of the sedation medicines used during the test).    SYMPTOMS TO REPORT IMMEDIATELY: A gastroenterologist can be reached at any hour.  During normal business hours, 8:30 AM to 5:00 PM Monday through Friday, call [phone].  After hours and on weekends, please call the GI answering service at [phone] who will take a message and have the physician on call contact you.   Following upper endoscopy (EGD)  Vomiting of blood or coffee ground material  New chest pain or pain under the shoulder blades  Painful or persistently difficult swallowing  New shortness of breath  Fever of 100F or higher  Black, tarry-looking stools  FOLLOW UP: If any biopsies were taken you will be contacted by phone or by letter within the next 1-3 weeks.  Call your gastroenterologist if you have not heard about the biopsies in 3 weeks.  Our staff will call the home number listed on your records the next business day following your procedure to check on you and address any questions or concerns that you may have at that time regarding the information given to you following your procedure. This is a courtesy call and so if there is no answer at the home number and we have not heard from you through  the emergency physician on call, we will assume that you have returned to your regular daily activities without incident.  SIGNATURES/CONFIDENTIALITY: You and/or your care partner have signed paperwork which will be entered into your electronic medical record.  These signatures attest to the fact that that the information above on your After Visit Summary has been reviewed and is understood.  Full responsibility of the confidentiality of this discharge information lies with you and/or your care-partner.

## 2011-09-12 ENCOUNTER — Telehealth: Payer: Self-pay | Admitting: *Deleted

## 2011-09-12 NOTE — Telephone Encounter (Signed)
Patient reports dysphagia to water and attempted to eat a sandwich yesterday but was not able to swallow it.  She is eating a soft diet of ensure, mashed potatoes and boiled string beans.  She states to swallow liquid she has to attempt to swallow several times "to get it to go down".  She states "it feels like everything has to go over a lump"

## 2011-09-12 NOTE — Telephone Encounter (Signed)
Spoke with patient to tell her that the office staff would give her a call to possibly arrange another silitation.   She said that they had called already, but thanked me for calling her.

## 2011-09-12 NOTE — Telephone Encounter (Signed)
Not surprised that there is still some dysphagia  May need another dilation this week or next week  Will have office call the patient to clarify the history some more

## 2011-09-12 NOTE — Telephone Encounter (Signed)
  Follow up Call-  Call back number 09/09/2011  Post procedure Call Back phone  # [phone]  Permission to leave phone message No     Patient questions:  Do you have a fever, pain , or abdominal swelling? no Pain Score  0 *  Have you tolerated food without any problems? no  Have you been able to return to your normal activities? yes  Do you have any questions about your discharge instructions: Patient states that she has trouble swallowing still; states it "feels like it has to open up then it goes down." Diet   yes  Will notify Dr. Leone Payor. Medications  no Follow up visit  no  Do you have questions or concerns about your Care? no  Actions: * If pain score is 4 or above: No action needed, pain <4.

## 2011-09-13 ENCOUNTER — Telehealth: Payer: Self-pay | Admitting: Internal Medicine

## 2011-09-13 NOTE — Telephone Encounter (Signed)
Patient is advised of EGD at [facility] for 2:30 tomorrow.  She is advised to be on a clear liquid diet until 10:30, then NPO .  She verbalized understanding of instructions

## 2011-09-13 NOTE — Telephone Encounter (Signed)
Patient called back to discuss a repeat dilation.  See phone note from 09/12/11.  No change in symptoms.  "Feels like a lump in my throat that the water has to go over".

## 2011-09-13 NOTE — Telephone Encounter (Signed)
See if I can add her on to follow tomorrow PM at hospital - need fluoro - maloney vs. savary

## 2011-09-14 ENCOUNTER — Encounter (HOSPITAL_COMMUNITY): Payer: Self-pay | Admitting: *Deleted

## 2011-09-14 ENCOUNTER — Encounter (HOSPITAL_COMMUNITY)
Admission: RE | Disposition: A | Discharge status: Home / Self Care | Payer: Self-pay | Source: Ambulatory Visit | Attending: Internal Medicine

## 2011-09-14 ENCOUNTER — Ambulatory Visit (HOSPITAL_COMMUNITY): Payer: Medicare Other

## 2011-09-14 ENCOUNTER — Ambulatory Visit (HOSPITAL_COMMUNITY)
Admission: RE | Admit: 2011-09-14 | Discharge: 2011-09-14 | Disposition: A | Discharge status: Home / Self Care | Payer: Medicare Other | Source: Ambulatory Visit | Attending: Internal Medicine | Admitting: Internal Medicine

## 2011-09-14 DIAGNOSIS — Z79899 Other long term (current) drug therapy: Secondary | ICD-10-CM | POA: Insufficient documentation

## 2011-09-14 DIAGNOSIS — E119 Type 2 diabetes mellitus without complications: Secondary | ICD-10-CM | POA: Insufficient documentation

## 2011-09-14 DIAGNOSIS — K219 Gastro-esophageal reflux disease without esophagitis: Secondary | ICD-10-CM | POA: Diagnosis not present

## 2011-09-14 DIAGNOSIS — I2510 Atherosclerotic heart disease of native coronary artery without angina pectoris: Secondary | ICD-10-CM | POA: Diagnosis not present

## 2011-09-14 DIAGNOSIS — K222 Esophageal obstruction: Secondary | ICD-10-CM

## 2011-09-14 DIAGNOSIS — I10 Essential (primary) hypertension: Secondary | ICD-10-CM | POA: Diagnosis not present

## 2011-09-14 DIAGNOSIS — R1310 Dysphagia, unspecified: Secondary | ICD-10-CM | POA: Insufficient documentation

## 2011-09-14 DIAGNOSIS — E785 Hyperlipidemia, unspecified: Secondary | ICD-10-CM | POA: Diagnosis not present

## 2011-09-14 HISTORY — PX: ESOPHAGOGASTRODUODENOSCOPY: SHX5428

## 2011-09-14 HISTORY — DX: Chronic kidney disease, unspecified: N18.9

## 2011-09-14 HISTORY — PX: SAVORY DILATION: SHX5439

## 2011-09-14 SURGERY — EGD (ESOPHAGOGASTRODUODENOSCOPY)
Anesthesia: Moderate Sedation

## 2011-09-14 MED ORDER — BUTAMBEN-TETRACAINE-BENZOCAINE 2-2-14 % EX AERO
INHALATION_SPRAY | CUTANEOUS | Status: DC | PRN
  Administered 2011-09-14: 2 via TOPICAL

## 2011-09-14 MED ORDER — FENTANYL CITRATE 0.05 MG/ML IJ SOLN
INTRAMUSCULAR | Status: DC | PRN
  Administered 2011-09-14 (×2): 25 ug via INTRAVENOUS

## 2011-09-14 MED ORDER — SODIUM CHLORIDE 0.9 % IV SOLN
Freq: Once | INTRAVENOUS | Status: AC
  Administered 2011-09-14: 500 mL via INTRAVENOUS

## 2011-09-14 MED ORDER — FENTANYL CITRATE 0.05 MG/ML IJ SOLN
INTRAMUSCULAR | Status: AC
  Filled 2011-09-14: qty 2

## 2011-09-14 MED ORDER — MIDAZOLAM HCL 10 MG/2ML IJ SOLN
INTRAMUSCULAR | Status: AC
  Filled 2011-09-14: qty 2

## 2011-09-14 MED ORDER — MIDAZOLAM HCL 10 MG/2ML IJ SOLN
INTRAMUSCULAR | Status: DC | PRN
  Administered 2011-09-14 (×2): 2 mg via INTRAVENOUS

## 2011-09-14 NOTE — Interval H&P Note (Signed)
History and Physical Interval Note:  09/14/2011 3:03 PM  Victoria Allison  has presented today for surgery, with the diagnosis of Dysphagia [787.20]  The various methods of treatment have been discussed with the patient and family. After consideration of risks, benefits and other options for treatment, the patient has consented to  Procedure(s) (LRB): ESOPHAGOGASTRODUODENOSCOPY (EGD) (N/A) SAVORY DILATION (N/A) as a surgical intervention .  The patient's history has been reviewed, patient examined, no change in status, stable for surgery.  I have reviewed the patients' chart and labs.  Questions were answered to the patient's satisfaction.     Stan Head, MD, [facility]

## 2011-09-14 NOTE — H&P (View-Only) (Signed)
Subjective:    Patient ID: Victoria Allison, female    DOB: 08/28/1940, 71 y.o.   MRN: [id number] Referred by: Gutierrez, Javier, MD HPI This is a delightful 71-year-old married white woman here with her husband because of problem swallowing. She saw Dr. Gutierrez in the absence of Dr. Tower, and was referred. She reports a several month history, if not longer of intermittent solid food dysphagia that is now occurring on a daily basis. She has a history of pemphigoid and sees a dermatologist at [facility]. She was referred to gastroenterology there, in February of this year but because of an anxiety situation related to the anniversary of her sons death, she did not feel like she couldn't make that appointment. She has lost more than 20 pounds over the past several months, probably 30 or 40. She's not eating as well. Liquids are generally okay, but she does definitely have dysphagia to many solid foods. She has had that regurgitate food bolus at times as well. She has never had this problem previously. She has had pemphigoid for 15 years, mostly manifested by mouth ulcerations and some in the throat. She is gone on multiple immunosuppressants and is currently on 75 mg of Cytoxan daily. She does have a history of heartburn and reflux but daily omeprazole controls that. She has concomitant loss of appetite, she is generally afraid to eat because of the dysphagia. She has recently begun drinking Ensure.  Chart review does show that she had been on prednisone in late 2012. Allergies  Allergen Reactions  . Aspirin     REACTION: bleeds too easily-  . Atorvastatin     REACTION: elevated LFT's  . Hydrocodone     REACTION: makes her loopy  . Tetanus Toxoid     REACTION: rash   Outpatient Prescriptions Prior to Visit  Medication Sig Dispense Refill  . acetaminophen (TYLENOL) 325 MG tablet Take 325 mg by mouth. OTC as directed       . Bimatoprost (LUMIGAN) 0.01 % SOLN One drop each eye daily       .  cyclophosphamide (CYTOXAN) 25 MG tablet Take 3 tablets (75 mg total) by mouth daily. Give on an empty stomach 1 hour before or 2 hours after meals.      . glucose blood test strip To check sugar once daily and as needed for diabetes 250.0  100 each  11  . glucose monitoring kit (FREESTYLE) monitoring kit 1 each. To check sugar once daily and as needed for diabetes 250.0       . lisinopril (PRINIVIL,ZESTRIL) 10 MG tablet Take 1 tablet (10 mg total) by mouth daily.  90 tablet  3  . omeprazole (PRILOSEC) 20 MG capsule TAKE ONE CAPSULE BY MOUTH EVERY MORNING  90 capsule  1  . oxybutynin (DITROPAN XL) 15 MG 24 hr tablet Take 15 mg by mouth daily.        . pravastatin (PRAVACHOL) 40 MG tablet Take 1 tablet (40 mg total) by mouth daily.  90 tablet  3  . prednisoLONE acetate (OMNIPRED) 1 % ophthalmic suspension One drop in each eye daily        Past Medical History  Diagnosis Date  . Allergic rhinitis   . GERD (gastroesophageal reflux disease)   . CAD (coronary artery disease)   . Hyperlipemia   . Hypertension   . Urinary incontinence     OVERACTIVE BLADDER  . Lichen sclerosus 05/09    (DX BY GYN AFTER ABN PAP)  .   Pemphigoid, cicatricial     D/O AFFECTING EYES, MOUTH, THROAT NOW (DX [facility])  . Elevated LFTs   . History of hepatitis   . DM (diabetes mellitus)   . Colon polyp   . Diverticulosis     sigmoid and descending colon  . Internal hemorrhoids    Past Surgical History  Procedure Date  . Oophorectomy   . Angioplasty     LAD  . Nose surgery     Biopsy  . Cataract extraction   . Cardiac electrophysiology mapping and ablation   . Colonoscopy   . Abdominal hysterectomy    History   Social History  . Marital Status: Married    Spouse Name: N/A    Number of Children: 2  . Years of Education: N/A   Occupational History  . Retired    Social History Main Topics  . Smoking status: Former Smoker    Quit date: 02/29/1968  . Smokeless tobacco: Never Used  . Alcohol Use: No  .  Drug Use: No    Social History Narrative   Husband is fighting cancer. Exercises on stationary bike/walksDaily caffeine    Family History  Problem Relation Age of Onset  . Stroke Mother     CVA  . Alzheimer's disease Mother   . Lung cancer Father   . Hypertension Brother   . Esophageal cancer Son 50  . Colon cancer Neg Hx   . Stomach cancer Son 50     Review of Systems Gum recession, tooth decay, insomnia, excessive urination, fatigued. Otherwise as per history of present illness, or O. other review of systems negative.    Objective:   Physical Exam General:  Well-developed, well-nourished and in no acute distress Eyes:  anicteric. ENT:   Mouth and posterior pharynx notable for gum recession, missing tooth Neck:   supple w/o thyromegaly or mass.  Lungs: Clear to auscultation bilaterally. Heart:  S1S2, no rubs, murmurs, gallops. Abdomen:  soft, non-tender, no hepatosplenomegaly, hernia, or mass and BS+.  Lymph:  no cervical or supraclavicular adenopathy. Extremities:   no edema Skin   no rash. Neuro:  A&O x 3.  Psych:  appropriate mood and  Affect.   Data Reviewed: Wt Readings from Last 3 Encounters:  08/26/11 115 lb (52.164 kg)  08/24/11 112 lb 12 oz (51.143 kg)  06/08/11 119 lb 8 oz (54.205 kg)   Lab Results  Component Value Date   WBC 4.0* 11/25/2010   HGB 12.6 11/25/2010   HCT 38.1 11/25/2010   MCV 102.8* 11/25/2010   PLT 177.0 11/25/2010   [facility] dermatology notes, primary care notes     Assessment & Plan:   1. Esophageal dysphagia   2. Pemphigoid    She may have a stricture related to pemphigoid. I believe these are usually proximal, they can be difficult to dilate. She could have Candida though she does not have odynophagia which is more typical of that.  Workup to start with barium swallow.  Likely upper endoscopy with dilation pending the barium swallow results. I've explained the risks benefits and indications of this to the patient and her husband and  they understand the plans.  We'll call the results of the barium swallow and next steps.  I appreciate the opportunity to care for this patient.   CC: Marne Tower, MD   

## 2011-09-14 NOTE — Op Note (Signed)
[facility] [address] [place], [place]  [address]  ENDOSCOPY PROCEDURE REPORT  PATIENT:  Victoria Allison, Victoria Allison  MR#:  [id number] BIRTHDATE:  1940-04-10, 71 yrs. old  GENDER:  female  ENDOSCOPIST:  Iva Boop, MD, Clementeen Graham ASSISTANT:  Beryle Beams, Technician and Janae Sauce, RN  PROCEDURE DATE:  09/14/2011 PROCEDURE:  Esophagoscopy ASA CLASS:  Class III INDICATIONS:  1) dilation of esophageal stricture Multiple rings in setting of pemphigoid, balloon dilation of distal esophagus to 15 mm last week, better but still has dysphagia  MEDICATIONS:   Fentanyl 50 mcg IV, Versed 4 mg IV TOPICAL ANESTHETIC:  Cetacaine Spray  DESCRIPTION OF PROCEDURE:   After the risks benefits and alternatives of the procedure were thoroughly explained, informed consent was obtained.  The Pentax Gastroscope [id number] endoscope was introduced through the mouth and advanced to the stomach body, without limitations.  The instrument was slowly withdrawn as the mucosa was carefully examined. <<PROCEDUREIMAGES>>  Multiple rings were seen in the esophagus. in the total esophagus. Subtle rings and diffusely narrowed esophagus.  The examination was otherwise normal.    Dilation was then performed at the total esophagus  1) Dilator:  Elease Hashimoto  Size(s):  44 French Resistance:  minimal  Heme:  none Appearance:  adequate Passed under fluoroscopic guidance. Reinspection showed good dilation effect with appropriate post-dilation heme in the esophagus.  COMPLICATIONS:  None  ENDOSCOPIC IMPRESSION: 1) Rings, multiple in the total esophagus - dilated 44 French 2) Otherwise normal examination to proximal gastric body RECOMMENDATIONS: 1) Clear liquids until 6 PM 2) Then full liquids 3) Soft diet in AM and advance if tolerated 4) Call Dr. Marvell Fuller office in 1 week to see if needs repeat dilation (symptom update)  Iva Boop, MD, [facility]  CC:  Judy Pimple, MD and The Patient  n. eSIGNED:    Iva Boop at 09/14/2011 03:36 PM  Emelia Salisbury, [phone]

## 2011-09-19 ENCOUNTER — Encounter (HOSPITAL_COMMUNITY): Payer: Self-pay | Admitting: Internal Medicine

## 2011-09-21 ENCOUNTER — Telehealth: Payer: Self-pay | Admitting: Internal Medicine

## 2011-09-21 NOTE — Telephone Encounter (Signed)
Patient had a procedure last week at the hospital and was told to call and let us know how she is doing.

## 2011-09-21 NOTE — Telephone Encounter (Signed)
Spoke with patient and she is doing great. "I am eating too much and thank you, thank, you. Wanted to let Dr. Leone Payor know.

## 2011-09-21 NOTE — Telephone Encounter (Signed)
Patient understands to call if problems reoccur

## 2011-09-21 NOTE — Telephone Encounter (Signed)
Let her know to call back if swallowing problems return as she could need repeat dilation

## 2011-10-06 DIAGNOSIS — L121 Cicatricial pemphigoid: Secondary | ICD-10-CM | POA: Diagnosis not present

## 2011-10-06 DIAGNOSIS — L129 Pemphigoid, unspecified: Secondary | ICD-10-CM | POA: Diagnosis not present

## 2011-10-06 DIAGNOSIS — Z79899 Other long term (current) drug therapy: Secondary | ICD-10-CM | POA: Diagnosis not present

## 2011-11-23 ENCOUNTER — Encounter: Payer: Self-pay | Admitting: Family Medicine

## 2011-11-23 ENCOUNTER — Ambulatory Visit (INDEPENDENT_AMBULATORY_CARE_PROVIDER_SITE_OTHER): Payer: Medicare Other | Admitting: Family Medicine

## 2011-11-23 VITALS — BP 108/58 | HR 72 | Temp 97.5°F | Ht 61.25 in | Wt 109.5 lb

## 2011-11-23 DIAGNOSIS — J069 Acute upper respiratory infection, unspecified: Secondary | ICD-10-CM | POA: Diagnosis not present

## 2011-11-23 MED ORDER — LISINOPRIL 10 MG PO TABS: 10.0000 mg | ORAL_TABLET | Freq: Every day | ORAL | Status: DC

## 2011-11-23 MED ORDER — AZITHROMYCIN 250 MG PO TABS: ORAL_TABLET | ORAL | Status: DC

## 2011-11-23 NOTE — Progress Notes (Signed)
Subjective:    Patient ID: Victoria Allison, female    DOB: 11/03/1940, 71 y.o.   MRN: [id number]  HPI Here with uri symptoms , and cough Was visiting husb in hospital -- ? What she has been exposed to   Cough- prod -yellow mucous Throat is ticking and keeps coughing  Also nasal cong and ear pain  Little bit of headache -some sinus pressure   Has had symptoms - fri night - worse over the weekend   Patient Active Problem List  Diagnosis  . Hyperglycemia  . HYPERLIPIDEMIA  . HYPERTENSION  . CORONARY ARTERY DISEASE  . SUPRAVENTRICULAR TACHYCARDIA  . ALLERGIC RHINITIS  . GERD  . PEMPHIGOID  . HAIR LOSS  . ARTHRITIS, SHOULDER  . LEG PAIN, CHRONIC  . INSOMNIA  . SHORTNESS OF BREATH  . URINARY INCONTINENCE  . Nonspecific (abnormal) findings on radiological and other examination of body structure  . COLONIC POLYPS, HX OF  . HX, URINARY INFECTION  . POSTMENOPAUSAL STATUS  . TRANSAMINASES, SERUM, ELEVATED  . Esophageal rings   Past Medical History  Diagnosis Date  . Allergic rhinitis   . GERD (gastroesophageal reflux disease)   . CAD (coronary artery disease)   . Hyperlipemia   . Hypertension   . Urinary incontinence     OVERACTIVE BLADDER  . Lichen sclerosus 05/09    (DX BY GYN AFTER ABN PAP)  . Pemphigoid, cicatricial     D/O AFFECTING EYES, MOUTH, THROAT NOW (DX [facility])  . Elevated LFTs   . History of hepatitis     unsure of type  . Colon polyp   . Diverticulosis     sigmoid and descending colon  . Internal hemorrhoids   . DM (diabetes mellitus)     no longer take any diabetic meds now that off Prednisone  . Chronic kidney disease     overactive bladder   Past Surgical History  Procedure Date  . Oophorectomy   . Angioplasty     LAD  . Nose surgery     Biopsy  . Cataract extraction   . Cardiac electrophysiology mapping and ablation   . Colonoscopy   . Abdominal hysterectomy   . Esophagogastroduodenoscopy 09/14/2011    Procedure:  ESOPHAGOGASTRODUODENOSCOPY (EGD);  Surgeon: Iva Boop, MD;  Location: [facility];  Service: Endoscopy;  Laterality: N/A;  needs xray  . Savory dilation 09/14/2011    Procedure: SAVORY DILATION;  Surgeon: Iva Boop, MD;  Location: [facility];  Service: Endoscopy;  Laterality: N/A;   History  Substance Use Topics  . Smoking status: Former Smoker    Quit date: 02/29/1968  . Smokeless tobacco: Never Used  . Alcohol Use: No   Family History  Problem Relation Age of Onset  . Stroke Mother     CVA  . Alzheimer's disease Mother   . Lung cancer Father   . Hypertension Brother   . Esophageal cancer Son 13  . Colon cancer Neg Hx   . Rectal cancer Neg Hx   . Stomach cancer Son 43   Allergies  Allergen Reactions  . Aspirin     REACTION: bleeds too easily-  . Atorvastatin     REACTION: elevated LFT's  . Hydrocodone     REACTION: makes her loopy  . Tetanus Toxoid     REACTION: rash   Current Outpatient Prescriptions on File Prior to Visit  Medication Sig Dispense Refill  . acetaminophen (TYLENOL) 325 MG tablet Take 325 mg by mouth. OTC  as directed       . Bimatoprost (LUMIGAN) 0.01 % SOLN One drop each eye daily       . cyclophosphamide (CYTOXAN) 25 MG tablet Take 50 mg by mouth daily. Give on an empty stomach 1 hour before or 2 hours after meals.      Marland Kitchen glucose blood test strip To check sugar once daily and as needed for diabetes 250.0  100 each  11  . glucose monitoring kit (FREESTYLE) monitoring kit 1 each. To check sugar once daily and as needed for diabetes 250.0       . lisinopril (PRINIVIL,ZESTRIL) 10 MG tablet Take 1 tablet (10 mg total) by mouth daily.  90 tablet  3  . omeprazole (PRILOSEC) 20 MG capsule TAKE ONE CAPSULE BY MOUTH EVERY MORNING  90 capsule  1  . oxybutynin (DITROPAN XL) 15 MG 24 hr tablet Take 15 mg by mouth daily.        . pravastatin (PRAVACHOL) 40 MG tablet Take 1 tablet (40 mg total) by mouth daily.  90 tablet  3  . prednisoLONE acetate  (OMNIPRED) 1 % ophthalmic suspension One drop in each eye daily           Review of Systems Review of Systems  Constitutional: Negative for fever, appetite change,  and unexpected weight change.  ENT pos for cong and post nasal drip /neg for sinus pain  Eyes: Negative for pain and visual disturbance.  Respiratory: Negative for cough and shortness of breath.   Cardiovascular: Negative for cp or palpitations    Gastrointestinal: Negative for nausea, diarrhea and constipation.  Genitourinary: Negative for urgency and frequency.  Skin: Negative for pallor or rash   Neurological: Negative for weakness, light-headedness, numbness and headaches.  Hematological: Negative for adenopathy. Does not bruise/bleed easily.  Psychiatric/Behavioral: Negative for dysphoric mood. The patient is not nervous/anxious.         Objective:   Physical Exam  Constitutional: She appears well-developed and well-nourished. No distress.  HENT:  Head: Normocephalic and atraumatic.  Right Ear: External ear normal.  Left Ear: External ear normal.  Mouth/Throat: Oropharynx is clear and moist. No oropharyngeal exudate.       Nares are injected and congested  No sinus tenderness   Eyes: Conjunctivae normal and EOM are normal. Pupils are equal, round, and reactive to light. Right eye exhibits no discharge. Left eye exhibits no discharge.  Neck: Normal range of motion. Neck supple.  Cardiovascular: Normal rate and regular rhythm.   Pulmonary/Chest: Effort normal and breath sounds normal. No respiratory distress. She has no wheezes. She has no rales.       Harsh bs with occ rhonchi  Lymphadenopathy:    She has no cervical adenopathy.  Neurological: She is alert. She has normal reflexes.  Skin: Skin is warm and dry. No rash noted.  Psychiatric: She has a normal mood and affect.          Assessment & Plan:

## 2011-11-23 NOTE — Assessment & Plan Note (Signed)
Since pt has been visiting husb in hosp- will cover with zithromax  Disc symptomatic care - see instructions on AVS  Update if not starting to improve in a week or if worsening

## 2011-11-23 NOTE — Patient Instructions (Addendum)
You have an upper respiratory infection  Since you were in the hospital- I will cover you with an antibiotic  Take the zithromax as directed Get rest and fluids Use nasal saline spray for congestion And mucinex DM for cough and congestion Update if not starting to improve in a week or if worsening

## 2011-12-01 ENCOUNTER — Other Ambulatory Visit (INDEPENDENT_AMBULATORY_CARE_PROVIDER_SITE_OTHER): Payer: Medicare Other

## 2011-12-01 DIAGNOSIS — I10 Essential (primary) hypertension: Secondary | ICD-10-CM | POA: Diagnosis not present

## 2011-12-01 DIAGNOSIS — E785 Hyperlipidemia, unspecified: Secondary | ICD-10-CM

## 2011-12-01 DIAGNOSIS — E119 Type 2 diabetes mellitus without complications: Secondary | ICD-10-CM | POA: Diagnosis not present

## 2011-12-01 LAB — COMPREHENSIVE METABOLIC PANEL
ALT: 15 U/L (ref 0–35)
AST: 17 U/L (ref 0–37)
Albumin: 3.6 g/dL (ref 3.5–5.2)
Alkaline Phosphatase: 96 U/L (ref 39–117)
BUN: 10 mg/dL (ref 6–23)
CO2: 27 mEq/L (ref 19–32)
Calcium: 9.3 mg/dL (ref 8.4–10.5)
Chloride: 105 mEq/L (ref 96–112)
Creatinine, Ser: 0.6 mg/dL (ref 0.4–1.2)
GFR: 112.68 mL/min (ref >60.00)
Glucose, Bld: 94 mg/dL (ref 70–99)
Potassium: 4 mEq/L (ref 3.5–5.1)
Sodium: 138 mEq/L (ref 135–145)
Total Bilirubin: 0.5 mg/dL (ref 0.3–1.2)
Total Protein: 6.4 g/dL (ref 6.0–8.3)

## 2011-12-01 LAB — LIPID PANEL
Cholesterol: 163 mg/dL (ref 0–200)
HDL: 42.8 mg/dL (ref >39.00)
LDL Cholesterol: 99 mg/dL (ref 0–99)
Total CHOL/HDL Ratio: 4
Triglycerides: 106 mg/dL (ref 0.0–149.0)
VLDL: 21.2 mg/dL (ref 0.0–40.0)

## 2011-12-01 LAB — CBC WITH DIFFERENTIAL/PLATELET
Basophils Absolute: 0 10*3/uL (ref 0.0–0.1)
Basophils Relative: 1.7 % (ref 0.0–3.0)
Eosinophils Absolute: 0.2 10*3/uL (ref 0.0–0.7)
Eosinophils Relative: 10 % — ABNORMAL HIGH (ref 0.0–5.0)
HCT: 33.1 % — ABNORMAL LOW (ref 36.0–46.0)
Hemoglobin: 11.2 g/dL — ABNORMAL LOW (ref 12.0–15.0)
Lymphocytes Relative: 23.2 % (ref 12.0–46.0)
Lymphs Abs: 0.5 10*3/uL — ABNORMAL LOW (ref 0.7–4.0)
MCHC: 33.9 g/dL (ref 30.0–36.0)
MCV: 100.7 fl — ABNORMAL HIGH (ref 78.0–100.0)
Monocytes Absolute: 0.2 10*3/uL (ref 0.1–1.0)
Monocytes Relative: 10.6 % (ref 3.0–12.0)
Neutro Abs: 1.1 10*3/uL — ABNORMAL LOW (ref 1.4–7.7)
Neutrophils Relative %: 54.5 % (ref 43.0–77.0)
Platelets: 136 10*3/uL — ABNORMAL LOW (ref 150.0–400.0)
RBC: 3.29 Mil/uL — ABNORMAL LOW (ref 3.87–5.11)
RDW: 13.7 % (ref 11.5–14.6)
WBC: 2.1 10*3/uL — ABNORMAL LOW (ref 4.5–10.5)

## 2011-12-01 LAB — TSH: TSH: 2.68 u[IU]/mL (ref 0.35–5.50)

## 2011-12-01 LAB — HEMOGLOBIN A1C: Hgb A1c MFr Bld: 5 % (ref 4.6–6.5)

## 2011-12-09 ENCOUNTER — Ambulatory Visit (INDEPENDENT_AMBULATORY_CARE_PROVIDER_SITE_OTHER): Payer: Medicare Other | Admitting: Family Medicine

## 2011-12-09 ENCOUNTER — Encounter: Payer: Self-pay | Admitting: Family Medicine

## 2011-12-09 VITALS — BP 118/62 | HR 76 | Temp 97.8°F | Ht 61.25 in | Wt 110.0 lb

## 2011-12-09 DIAGNOSIS — D72819 Decreased white blood cell count, unspecified: Secondary | ICD-10-CM | POA: Diagnosis not present

## 2011-12-09 DIAGNOSIS — E785 Hyperlipidemia, unspecified: Secondary | ICD-10-CM | POA: Diagnosis not present

## 2011-12-09 DIAGNOSIS — L129 Pemphigoid, unspecified: Secondary | ICD-10-CM | POA: Diagnosis not present

## 2011-12-09 DIAGNOSIS — I10 Essential (primary) hypertension: Secondary | ICD-10-CM

## 2011-12-09 MED ORDER — OMEPRAZOLE 20 MG PO CPDR: 20.0000 mg | DELAYED_RELEASE_CAPSULE | Freq: Every day | ORAL | Status: DC

## 2011-12-09 MED ORDER — PRAVASTATIN SODIUM 40 MG PO TABS: 40.0000 mg | ORAL_TABLET | Freq: Every day | ORAL | Status: DC

## 2011-12-09 NOTE — Patient Instructions (Signed)
Your white blood cell count is low - possibly from the cytoxan  We will send result to your dermatologist now  Follow up with them on Thursday as planned Stay away from sick people in the meantime  Blood pressure are stable I'm glad you are doing well  Follow up with me in about 6 months with labs prior

## 2011-12-09 NOTE — Assessment & Plan Note (Signed)
bp in fair control at this time  No changes needed  Disc lifstyle change with low sodium diet and exercise   Rev labs with pt  

## 2011-12-09 NOTE — Assessment & Plan Note (Signed)
Disc goals for lipids and reasons to control them Rev labs with pt Rev low sat fat diet in detail On pravachol and diet  

## 2011-12-09 NOTE — Assessment & Plan Note (Signed)
Overall doing better with low dose cytoxin- but she is anemic and leukopenic today and I worry that is from the medicine Sent labs to her derm at [facility] today  She will f/u thurs

## 2011-12-09 NOTE — Assessment & Plan Note (Signed)
Afraid this is from cytoxin  Sent result to derm at [facility] to avoid a lot of personal contact and avoid sick people  Will need flu shot if safe to do so

## 2011-12-09 NOTE — Progress Notes (Signed)
Subjective:    Patient ID: Victoria Allison, female    DOB: [birth year], 71 y.o.   MRN: [id number]  HPI Here for f/u of chronic problems  Is doing pretty good   Still some cong and cough   bp is stable today  No cp or palpitations or headaches or edema  No side effects to medicines  BP Readings from Last 3 Encounters:  12/09/11 118/62  11/23/11 108/58  09/14/11 114/68      Lab Results  Component Value Date   CHOL 163 12/01/2011   HDL 42.80 12/01/2011   LDLCALC 99 12/01/2011   LDLDIRECT 184.0 11/25/2010   TRIG 106.0 12/01/2011   CHOLHDL 4 12/01/2011   if eating "half way " healthy  Had to have esoph dilatation twice -- so eating more carbs and bland Feels better now  On pravachol now   Wbc low Lab Results  Component Value Date   WBC 2.1 Repeated and verified X2.* 12/01/2011   HGB 11.2* 12/01/2011   HCT 33.1* 12/01/2011   MCV 100.7* 12/01/2011   PLT 136.0* 12/01/2011   On cytoxin for pemphigoid Need to send to her doctors at [facility] -- Dr Romilda Garret and Dr Trisha Mangle Is taking once daily  Did have to dec dose - feels bad at higher doses  Makes her anemic too  Has f/u thurs Will ask about flu shot   Wt stable bmi 20   Patient Active Problem List  Diagnosis  . Hyperglycemia  . HYPERLIPIDEMIA  . HYPERTENSION  . CORONARY ARTERY DISEASE  . SUPRAVENTRICULAR TACHYCARDIA  . ALLERGIC RHINITIS  . GERD  . PEMPHIGOID  . HAIR LOSS  . ARTHRITIS, SHOULDER  . LEG PAIN, CHRONIC  . INSOMNIA  . SHORTNESS OF BREATH  . URINARY INCONTINENCE  . Nonspecific (abnormal) findings on radiological and other examination of body structure  . COLONIC POLYPS, HX OF  . HX, URINARY INFECTION  . POSTMENOPAUSAL STATUS  . TRANSAMINASES, SERUM, ELEVATED  . Esophageal rings  . Viral URI with cough  . Leukopenia   Past Medical History  Diagnosis Date  . Allergic rhinitis   . GERD (gastroesophageal reflux disease)   . CAD (coronary artery disease)   . Hyperlipemia   . Hypertension   . Urinary  incontinence     OVERACTIVE BLADDER  . Lichen sclerosus 05/09    (DX BY GYN AFTER ABN PAP)  . Pemphigoid, cicatricial     D/O AFFECTING EYES, MOUTH, THROAT NOW (DX [facility])  . Elevated LFTs   . History of hepatitis     unsure of type  . Colon polyp   . Diverticulosis     sigmoid and descending colon  . Internal hemorrhoids   . DM (diabetes mellitus)     no longer take any diabetic meds now that off Prednisone  . Chronic kidney disease     overactive bladder   Past Surgical History  Procedure Date  . Oophorectomy   . Angioplasty     LAD  . Nose surgery     Biopsy  . Cataract extraction   . Cardiac electrophysiology mapping and ablation   . Colonoscopy   . Abdominal hysterectomy   . Esophagogastroduodenoscopy 09/14/2011    Procedure: ESOPHAGOGASTRODUODENOSCOPY (EGD);  Surgeon: Iva Boop, MD;  Location: [facility];  Service: Endoscopy;  Laterality: N/A;  needs xray  . Savory dilation 09/14/2011    Procedure: SAVORY DILATION;  Surgeon: Iva Boop, MD;  Location: [facility];  Service: Endoscopy;  Laterality:  N/A;   History  Substance Use Topics  . Smoking status: Former Smoker    Quit date: 02/29/1968  . Smokeless tobacco: Never Used  . Alcohol Use: No   Family History  Problem Relation Age of Onset  . Stroke Mother     CVA  . Alzheimer's disease Mother   . Lung cancer Father   . Hypertension Brother   . Esophageal cancer Son 83  . Colon cancer Neg Hx   . Rectal cancer Neg Hx   . Stomach cancer Son 2   Allergies  Allergen Reactions  . Aspirin     REACTION: bleeds too easily-  . Atorvastatin     REACTION: elevated LFT's  . Hydrocodone     REACTION: makes her loopy  . Tetanus Toxoid     REACTION: rash   Current Outpatient Prescriptions on File Prior to Visit  Medication Sig Dispense Refill  . acetaminophen (TYLENOL) 325 MG tablet Take 325 mg by mouth. OTC as directed       . azithromycin (ZITHROMAX) 250 MG tablet Take 2 pills by mouth today and  then 1 pill daily for 4 days  6 tablet  0  . Bimatoprost (LUMIGAN) 0.01 % SOLN One drop each eye daily       . cyclophosphamide (CYTOXAN) 25 MG tablet Take 50 mg by mouth daily. Give on an empty stomach 1 hour before or 2 hours after meals.      Marland Kitchen glucose blood test strip To check sugar once daily and as needed for diabetes 250.0  100 each  11  . glucose monitoring kit (FREESTYLE) monitoring kit 1 each. To check sugar once daily and as needed for diabetes 250.0       . HYDROcodone-acetaminophen (VICODIN) 5-500 MG per tablet Take 1 tablet by mouth as needed.      Marland Kitchen lisinopril (PRINIVIL,ZESTRIL) 10 MG tablet Take 1 tablet (10 mg total) by mouth daily.  90 tablet  3  . omeprazole (PRILOSEC) 20 MG capsule TAKE ONE CAPSULE BY MOUTH EVERY MORNING  90 capsule  1  . oxybutynin (DITROPAN XL) 15 MG 24 hr tablet Take 15 mg by mouth daily.        . pravastatin (PRAVACHOL) 40 MG tablet Take 1 tablet (40 mg total) by mouth daily.  90 tablet  3  . prednisoLONE acetate (OMNIPRED) 1 % ophthalmic suspension One drop in each eye daily       . PREVIDENT 5000 DRY MOUTH 1.1 % PSTE 1 drop by Mouth Rinse route Daily. Toothpaste            Review of Systems Review of Systems  Constitutional: Negative for fever, appetite change, fatigue and unexpected weight change.  Eyes: Negative for pain and visual disturbance. ENT pos for chronic mouth pain from her pemphigoid- but improved from prev   Respiratory: Negative for cough and shortness of breath.   Cardiovascular: Negative for cp or palpitations    Gastrointestinal: Negative for nausea, diarrhea and constipation.  Genitourinary: Negative for urgency and frequency.  Skin: Negative for pallor or rash   Neurological: Negative for weakness, light-headedness, numbness and headaches.  Hematological: Negative for adenopathy. Does not bruise/bleed easily.  Psychiatric/Behavioral: Negative for dysphoric mood. The patient is not nervous/anxious.         Objective:    Physical Exam  Constitutional: She appears well-developed and well-nourished. No distress.  HENT:  Head: Normocephalic and atraumatic.  Right Ear: External ear normal.  Left Ear: External ear  normal.  Nose: Nose normal.  Mouth/Throat: Oropharynx is clear and moist.  Eyes: Conjunctivae normal and EOM are normal. Pupils are equal, round, and reactive to light. Right eye exhibits no discharge. Left eye exhibits no discharge.  Neck: Normal range of motion. Neck supple. No JVD present. Carotid bruit is not present. No thyromegaly present.  Cardiovascular: Normal rate, regular rhythm, normal heart sounds and intact distal pulses.  Exam reveals no gallop.   Pulmonary/Chest: Effort normal and breath sounds normal. No respiratory distress. She has no wheezes.  Abdominal: Soft. Bowel sounds are normal. She exhibits no distension, no abdominal bruit and no mass. There is no tenderness.  Musculoskeletal: She exhibits no edema and no tenderness.  Lymphadenopathy:    She has no cervical adenopathy.  Neurological: She is alert. She has normal reflexes. No cranial nerve deficit. She exhibits normal muscle tone. Coordination normal.  Skin: Skin is warm and dry. No rash noted. No erythema. No pallor.  Psychiatric: She has a normal mood and affect.          Assessment & Plan:

## 2011-12-15 DIAGNOSIS — L570 Actinic keratosis: Secondary | ICD-10-CM | POA: Diagnosis not present

## 2011-12-15 DIAGNOSIS — L121 Cicatricial pemphigoid: Secondary | ICD-10-CM | POA: Diagnosis not present

## 2011-12-22 DIAGNOSIS — H4010X0 Unspecified open-angle glaucoma, stage unspecified: Secondary | ICD-10-CM | POA: Diagnosis not present

## 2012-02-08 ENCOUNTER — Other Ambulatory Visit: Payer: Self-pay

## 2012-02-08 MED ORDER — GLUCOSE BLOOD VI STRP: ORAL_STRIP | Status: DC

## 2012-02-08 NOTE — Telephone Encounter (Signed)
Kendall with [employer] said new guideline needs manual fax for test strips. Refill faxed as instructed.

## 2012-02-16 DIAGNOSIS — Z91040 Latex allergy status: Secondary | ICD-10-CM | POA: Diagnosis not present

## 2012-02-16 DIAGNOSIS — L570 Actinic keratosis: Secondary | ICD-10-CM | POA: Diagnosis not present

## 2012-02-16 DIAGNOSIS — Z880 Allergy status to penicillin: Secondary | ICD-10-CM | POA: Diagnosis not present

## 2012-02-16 DIAGNOSIS — Z79899 Other long term (current) drug therapy: Secondary | ICD-10-CM | POA: Diagnosis not present

## 2012-02-16 DIAGNOSIS — L121 Cicatricial pemphigoid: Secondary | ICD-10-CM | POA: Diagnosis not present

## 2012-04-26 DIAGNOSIS — L570 Actinic keratosis: Secondary | ICD-10-CM | POA: Diagnosis not present

## 2012-04-26 DIAGNOSIS — L121 Cicatricial pemphigoid: Secondary | ICD-10-CM | POA: Diagnosis not present

## 2012-04-26 DIAGNOSIS — Z85828 Personal history of other malignant neoplasm of skin: Secondary | ICD-10-CM | POA: Diagnosis not present

## 2012-04-26 DIAGNOSIS — L139 Bullous disorder, unspecified: Secondary | ICD-10-CM | POA: Diagnosis not present

## 2012-05-09 DIAGNOSIS — R339 Retention of urine, unspecified: Secondary | ICD-10-CM | POA: Insufficient documentation

## 2012-05-09 DIAGNOSIS — N3020 Other chronic cystitis without hematuria: Secondary | ICD-10-CM | POA: Insufficient documentation

## 2012-05-09 DIAGNOSIS — L940 Localized scleroderma [morphea]: Secondary | ICD-10-CM | POA: Insufficient documentation

## 2012-05-09 DIAGNOSIS — R399 Unspecified symptoms and signs involving the genitourinary system: Secondary | ICD-10-CM | POA: Insufficient documentation

## 2012-05-09 DIAGNOSIS — D414 Neoplasm of uncertain behavior of bladder: Secondary | ICD-10-CM | POA: Insufficient documentation

## 2012-05-10 DIAGNOSIS — R339 Retention of urine, unspecified: Secondary | ICD-10-CM | POA: Diagnosis not present

## 2012-05-10 DIAGNOSIS — N3020 Other chronic cystitis without hematuria: Secondary | ICD-10-CM | POA: Diagnosis not present

## 2012-05-10 DIAGNOSIS — N3946 Mixed incontinence: Secondary | ICD-10-CM | POA: Diagnosis not present

## 2012-05-10 DIAGNOSIS — R3989 Other symptoms and signs involving the genitourinary system: Secondary | ICD-10-CM | POA: Diagnosis not present

## 2012-05-31 ENCOUNTER — Telehealth: Payer: Self-pay | Admitting: Family Medicine

## 2012-05-31 DIAGNOSIS — D72819 Decreased white blood cell count, unspecified: Secondary | ICD-10-CM

## 2012-05-31 DIAGNOSIS — I10 Essential (primary) hypertension: Secondary | ICD-10-CM

## 2012-05-31 NOTE — Telephone Encounter (Signed)
Message copied by Judy Pimple on Thu May 31, 2012  7:39 PM ------      Message from: Baldomero Lamy      Created: Wed May 30, 2012 11:29 AM      Regarding: 6 mo f/u labs Fri 4/4        Please order  future f/u labs for pt's upcomming lab appt.      Thanks      Tasha                   ------

## 2012-06-01 ENCOUNTER — Other Ambulatory Visit (INDEPENDENT_AMBULATORY_CARE_PROVIDER_SITE_OTHER): Payer: Medicare Other

## 2012-06-01 DIAGNOSIS — D72819 Decreased white blood cell count, unspecified: Secondary | ICD-10-CM | POA: Diagnosis not present

## 2012-06-01 DIAGNOSIS — I10 Essential (primary) hypertension: Secondary | ICD-10-CM | POA: Diagnosis not present

## 2012-06-01 LAB — COMPREHENSIVE METABOLIC PANEL
ALT: 17 U/L (ref 0–35)
AST: 19 U/L (ref 0–37)
Albumin: 3.6 g/dL (ref 3.5–5.2)
Alkaline Phosphatase: 106 U/L (ref 39–117)
BUN: 11 mg/dL (ref 6–23)
CO2: 29 mEq/L (ref 19–32)
Calcium: 9.3 mg/dL (ref 8.4–10.5)
Chloride: 103 mEq/L (ref 96–112)
Creatinine, Ser: 0.6 mg/dL (ref 0.4–1.2)
GFR: 100.05 mL/min (ref >60.00)
Glucose, Bld: 96 mg/dL (ref 70–99)
Potassium: 4.1 mEq/L (ref 3.5–5.1)
Sodium: 136 mEq/L (ref 135–145)
Total Bilirubin: 0.3 mg/dL (ref 0.3–1.2)
Total Protein: 6.9 g/dL (ref 6.0–8.3)

## 2012-06-01 LAB — CBC WITH DIFFERENTIAL/PLATELET
Basophils Absolute: 0 10*3/uL (ref 0.0–0.1)
Basophils Relative: 0.8 % (ref 0.0–3.0)
Eosinophils Absolute: 0.2 10*3/uL (ref 0.0–0.7)
Eosinophils Relative: 4 % (ref 0.0–5.0)
HCT: 35.4 % — ABNORMAL LOW (ref 36.0–46.0)
Hemoglobin: 11.9 g/dL — ABNORMAL LOW (ref 12.0–15.0)
Lymphocytes Relative: 27.9 % (ref 12.0–46.0)
Lymphs Abs: 1.2 10*3/uL (ref 0.7–4.0)
MCHC: 33.5 g/dL (ref 30.0–36.0)
MCV: 94 fl (ref 78.0–100.0)
Monocytes Absolute: 0.4 10*3/uL (ref 0.1–1.0)
Monocytes Relative: 9.4 % (ref 3.0–12.0)
Neutro Abs: 2.6 10*3/uL (ref 1.4–7.7)
Neutrophils Relative %: 57.9 % (ref 43.0–77.0)
Platelets: 165 10*3/uL (ref 150.0–400.0)
RBC: 3.77 Mil/uL — ABNORMAL LOW (ref 3.87–5.11)
RDW: 13 % (ref 11.5–14.6)
WBC: 4.5 10*3/uL (ref 4.5–10.5)

## 2012-06-08 ENCOUNTER — Encounter: Payer: Self-pay | Admitting: Family Medicine

## 2012-06-08 ENCOUNTER — Ambulatory Visit (INDEPENDENT_AMBULATORY_CARE_PROVIDER_SITE_OTHER): Payer: Medicare Other | Admitting: Family Medicine

## 2012-06-08 VITALS — BP 120/70 | HR 74 | Temp 98.2°F | Ht 61.25 in | Wt 105.8 lb

## 2012-06-08 DIAGNOSIS — I10 Essential (primary) hypertension: Secondary | ICD-10-CM | POA: Diagnosis not present

## 2012-06-08 DIAGNOSIS — D72819 Decreased white blood cell count, unspecified: Secondary | ICD-10-CM

## 2012-06-08 NOTE — Patient Instructions (Addendum)
I'm glad you are doing well and the pemphigoid is under control  Labs look better at this point Blood pressure is very good today  Keep taking care of yourself Follow up in 6 months for annual exam with labs prior Make appt with Dr Patsy Lager on the way out for your shoulder pain

## 2012-06-08 NOTE — Assessment & Plan Note (Signed)
bp in fair control at this time  No changes needed  Disc lifstyle change with low sodium diet and exercise   Reviewed lab today- f/u 6 mo

## 2012-06-08 NOTE — Assessment & Plan Note (Signed)
Now resolved off cytoxin- thankfully pemphigoid is improved and she does not need it currently  Will continue to watch this  Very mild anemia (Hb 11.9) persists without symptoms F/u 6 mo

## 2012-06-08 NOTE — Progress Notes (Signed)
Subjective:    Patient ID: Victoria Allison, female    DOB: Mar 15, 1940, 72 y.o.   MRN: [id number]  HPI Here for f/u of chronic medical problems  Wt is down 5 lb  Had her teeth pulled  With pemphigoid her gums disappeared  Will have to get implants to hold lower denture in - this is very expensive  She has a shoulder problem in L shoulder- aches   Leukopenia-last visit wbc was in the 2s- thought due to her pemphigoid tx/ cytoxin She came off of that 1-2 mo ago and she is doing very well - mouth is overall good  Today is 4.5 Still mildly anemic- very slightly Lab Results  Component Value Date   WBC 4.5 06/01/2012   HGB 11.9* 06/01/2012   HCT 35.4* 06/01/2012   MCV 94.0 06/01/2012   PLT 165.0 06/01/2012     bp is stable today  No cp or palpitations or headaches or edema  No side effects to medicines  BP Readings from Last 3 Encounters:  06/08/12 120/70  12/09/11 118/62  11/23/11 108/58     Very good control   Chemistry      Component Value Date/Time   NA 136 06/01/2012 0855   K 4.1 06/01/2012 0855   CL 103 06/01/2012 0855   CO2 29 06/01/2012 0855   BUN 11 06/01/2012 0855   CREATININE 0.6 06/01/2012 0855      Component Value Date/Time   CALCIUM 9.3 06/01/2012 0855   ALKPHOS 106 06/01/2012 0855   AST 19 06/01/2012 0855   ALT 17 06/01/2012 0855   BILITOT 0.3 06/01/2012 0855     glucose good at 96  Lab Results  Component Value Date   CHOL 163 12/01/2011   HDL 42.80 12/01/2011   LDLCALC 99 12/01/2011   LDLDIRECT 184.0 11/25/2010   TRIG 106.0 12/01/2011   CHOLHDL 4 12/01/2011   that was well control and stable   Patient Active Problem List  Diagnosis  . HYPERLIPIDEMIA  . HYPERTENSION  . CORONARY ARTERY DISEASE  . SUPRAVENTRICULAR TACHYCARDIA  . ALLERGIC RHINITIS  . GERD  . PEMPHIGOID  . HAIR LOSS  . ARTHRITIS, SHOULDER  . LEG PAIN, CHRONIC  . INSOMNIA  . SHORTNESS OF BREATH  . URINARY INCONTINENCE  . Nonspecific (abnormal) findings on radiological and other examination of body  structure  . COLONIC POLYPS, HX OF  . HX, URINARY INFECTION  . POSTMENOPAUSAL STATUS  . TRANSAMINASES, SERUM, ELEVATED  . Esophageal rings  . Leukopenia   Past Medical History  Diagnosis Date  . Allergic rhinitis   . GERD (gastroesophageal reflux disease)   . CAD (coronary artery disease)   . Hyperlipemia   . Hypertension   . Urinary incontinence     OVERACTIVE BLADDER  . Lichen sclerosus 05/09    (DX BY GYN AFTER ABN PAP)  . Pemphigoid, cicatricial     D/O AFFECTING EYES, MOUTH, THROAT NOW (DX [facility])  . Elevated LFTs   . History of hepatitis     unsure of type  . Colon polyp   . Diverticulosis     sigmoid and descending colon  . Internal hemorrhoids   . DM (diabetes mellitus)     no longer take any diabetic meds now that off Prednisone  . Chronic kidney disease     overactive bladder   Past Surgical History  Procedure Laterality Date  . Oophorectomy    . Angioplasty      LAD  . Nose  surgery      Biopsy  . Cataract extraction    . Cardiac electrophysiology mapping and ablation    . Colonoscopy    . Abdominal hysterectomy    . Esophagogastroduodenoscopy  09/14/2011    Procedure: ESOPHAGOGASTRODUODENOSCOPY (EGD);  Surgeon: Iva Boop, MD;  Location: [facility];  Service: Endoscopy;  Laterality: N/A;  needs xray  . Savory dilation  09/14/2011    Procedure: SAVORY DILATION;  Surgeon: Iva Boop, MD;  Location: [facility];  Service: Endoscopy;  Laterality: N/A;   History  Substance Use Topics  . Smoking status: Former Smoker    Quit date: 02/29/1968  . Smokeless tobacco: Never Used  . Alcohol Use: No   Family History  Problem Relation Age of Onset  . Stroke Mother     CVA  . Alzheimer's disease Mother   . Lung cancer Father   . Hypertension Brother   . Esophageal cancer Son 49  . Colon cancer Neg Hx   . Rectal cancer Neg Hx   . Stomach cancer Son 26   Allergies  Allergen Reactions  . Aspirin     REACTION: bleeds too easily-  . Atorvastatin      REACTION: elevated LFT's  . Hydrocodone     REACTION: makes her loopy  . Tetanus Toxoid     REACTION: rash   Current Outpatient Prescriptions on File Prior to Visit  Medication Sig Dispense Refill  . acetaminophen (TYLENOL) 325 MG tablet Take 325 mg by mouth. OTC as directed       . Bimatoprost (LUMIGAN) 0.01 % SOLN One drop each eye daily       . lisinopril (PRINIVIL,ZESTRIL) 10 MG tablet Take 1 tablet (10 mg total) by mouth daily.  90 tablet  3  . omeprazole (PRILOSEC) 20 MG capsule Take 1 capsule (20 mg total) by mouth daily.  90 capsule  3  . oxybutynin (DITROPAN XL) 15 MG 24 hr tablet Take 15 mg by mouth daily.        . pravastatin (PRAVACHOL) 40 MG tablet Take 1 tablet (40 mg total) by mouth daily.  90 tablet  3  . prednisoLONE acetate (OMNIPRED) 1 % ophthalmic suspension One drop in each eye daily       . glucose blood test strip To check sugar once daily and as needed for diabetes 250.0  100 each  2  . glucose monitoring kit (FREESTYLE) monitoring kit 1 each. To check sugar once daily and as needed for diabetes 250.0        No current facility-administered medications on file prior to visit.    Review of Systems Review of Systems  Constitutional: Negative for fever, appetite change, fatigue and unexpected weight change.  Eyes: Negative for pain and visual disturbance.  ENT pos for receeding gums/ neg for active pemphigoid lesions at this time Respiratory: Negative for cough and shortness of breath.   Cardiovascular: Negative for cp or palpitations    Gastrointestinal: Negative for nausea, diarrhea and constipation.  Genitourinary: Negative for urgency and frequency.  Skin: Negative for pallor or rash   MSK pos for L shoulder pain and stiffness / with throbbing at night, neg for joint swelling Neurological: Negative for weakness, light-headedness, numbness and headaches.  Hematological: Negative for adenopathy. Does not bruise/bleed easily.  Psychiatric/Behavioral:  Negative for dysphoric mood. The patient is not nervous/anxious.         Objective:   Physical Exam  Constitutional: She appears well-developed and well-nourished. No  distress.  HENT:  Head: Normocephalic and atraumatic.  Mouth/Throat: Oropharynx is clear and moist.  Eyes: Conjunctivae and EOM are normal. Pupils are equal, round, and reactive to light. Right eye exhibits no discharge. Left eye exhibits no discharge. No scleral icterus.  Neck: Normal range of motion. Neck supple. No JVD present. Carotid bruit is not present. No thyromegaly present.  Cardiovascular: Normal rate, regular rhythm, normal heart sounds and intact distal pulses.  Exam reveals no gallop.   Pulmonary/Chest: Effort normal and breath sounds normal. No respiratory distress. She has no wheezes.  No crackles  Abdominal: Soft. Bowel sounds are normal. She exhibits no distension, no abdominal bruit and no mass. There is no tenderness.  Musculoskeletal: She exhibits no edema.  Lymphadenopathy:    She has no cervical adenopathy.  Neurological: She is alert. She has normal reflexes. No cranial nerve deficit. She exhibits normal muscle tone. Coordination normal.  Skin: Skin is warm and dry. No rash noted. No erythema. No pallor.  Psychiatric: She has a normal mood and affect.          Assessment & Plan:

## 2012-06-11 ENCOUNTER — Ambulatory Visit (INDEPENDENT_AMBULATORY_CARE_PROVIDER_SITE_OTHER): Payer: Medicare Other | Admitting: Family Medicine

## 2012-06-11 ENCOUNTER — Ambulatory Visit (INDEPENDENT_AMBULATORY_CARE_PROVIDER_SITE_OTHER)
Admission: RE | Admit: 2012-06-11 | Discharge: 2012-06-11 | Disposition: A | Discharge status: Home / Self Care | Payer: Medicare Other | Source: Ambulatory Visit | Attending: Family Medicine | Admitting: Family Medicine

## 2012-06-11 ENCOUNTER — Encounter: Payer: Self-pay | Admitting: Family Medicine

## 2012-06-11 VITALS — BP 120/62 | HR 72 | Temp 98.1°F | Wt 106.0 lb

## 2012-06-11 DIAGNOSIS — M7582 Other shoulder lesions, left shoulder: Secondary | ICD-10-CM

## 2012-06-11 DIAGNOSIS — M719 Bursopathy, unspecified: Secondary | ICD-10-CM

## 2012-06-11 DIAGNOSIS — M7542 Impingement syndrome of left shoulder: Secondary | ICD-10-CM

## 2012-06-11 DIAGNOSIS — M25512 Pain in left shoulder: Secondary | ICD-10-CM

## 2012-06-11 DIAGNOSIS — M67919 Unspecified disorder of synovium and tendon, unspecified shoulder: Secondary | ICD-10-CM

## 2012-06-11 DIAGNOSIS — M25519 Pain in unspecified shoulder: Secondary | ICD-10-CM | POA: Diagnosis not present

## 2012-06-11 DIAGNOSIS — M19019 Primary osteoarthritis, unspecified shoulder: Secondary | ICD-10-CM | POA: Diagnosis not present

## 2012-06-11 DIAGNOSIS — M25819 Other specified joint disorders, unspecified shoulder: Secondary | ICD-10-CM | POA: Diagnosis not present

## 2012-06-11 NOTE — Progress Notes (Signed)
[facility] conservation officer at [facility] [address] [place] [address] Phone: [phone] Fax: [phone]  Date:  06/11/2012   Name:  Victoria Allison   DOB:  1940-09-20   MRN:  [id number] Gender: female Age: 72 y.o.  Primary Physician:  Roxy Manns, MD  Evaluating MD: Hannah Beat, MD   Chief Complaint: Both shoulders hurting x several months.  Left shoulder is w   History of Present Illness:  Victoria Allison is a 72 y.o. pleasant patient who presents with the following:  Was taking cytoxan for pemphigoid, and then both will hurt a lot. Will hurt a lot at night. Lays with left arm down. Cannot raise it up.   No other.  Rare neck pain.  Able to clean yard and shrubs.   The patient noted above presents with shoulder pain that has been ongoing for 3-4 months.  there is no history of trauma or accident. The patient has rare neck pain but no radicular symptoms. Denies dislocation, subluxation, separation of the shoulder. The patient does complain of pain in the overhead plane and abduction.  Medications Tried: tylenol Ice or Heat: Tried PT: No  Prior shoulder Injury: No Prior surgery: No Prior fracture: N0   Patient Active Problem List  Diagnosis  . HYPERLIPIDEMIA  . HYPERTENSION  . CORONARY ARTERY DISEASE  . SUPRAVENTRICULAR TACHYCARDIA  . ALLERGIC RHINITIS  . GERD  . PEMPHIGOID  . HAIR LOSS  . ARTHRITIS, SHOULDER  . LEG PAIN, CHRONIC  . INSOMNIA  . URINARY INCONTINENCE  . Nonspecific (abnormal) findings on radiological and other examination of body structure  . COLONIC POLYPS, HX OF  . HX, URINARY INFECTION  . POSTMENOPAUSAL STATUS  . TRANSAMINASES, SERUM, ELEVATED  . Esophageal rings  . Leukopenia    Past Medical History  Diagnosis Date  . Allergic rhinitis   . GERD (gastroesophageal reflux disease)   . CAD (coronary artery disease)   . Hyperlipemia   . Hypertension   . Urinary incontinence     OVERACTIVE BLADDER  . Lichen sclerosus  05/09    (DX BY GYN AFTER ABN PAP)  . Pemphigoid, cicatricial     D/O AFFECTING EYES, MOUTH, THROAT NOW (DX [facility])  . Elevated LFTs   . History of hepatitis     unsure of type  . Colon polyp   . Diverticulosis     sigmoid and descending colon  . Internal hemorrhoids   . DM (diabetes mellitus)     no longer take any diabetic meds now that off Prednisone  . Chronic kidney disease     overactive bladder    Past Surgical History  Procedure Laterality Date  . Oophorectomy    . Angioplasty      LAD  . Nose surgery      Biopsy  . Cataract extraction    . Cardiac electrophysiology mapping and ablation    . Colonoscopy    . Abdominal hysterectomy    . Esophagogastroduodenoscopy  09/14/2011    Procedure: ESOPHAGOGASTRODUODENOSCOPY (EGD);  Surgeon: Iva Boop, MD;  Location: [facility];  Service: Endoscopy;  Laterality: N/A;  needs xray  . Savory dilation  09/14/2011    Procedure: SAVORY DILATION;  Surgeon: Iva Boop, MD;  Location: [facility];  Service: Endoscopy;  Laterality: N/A;    History   Social History  . Marital Status: Married    Spouse Name: N/A    Number of Children: 2  . Years of Education: N/A  Occupational History  . Retired    Social History Main Topics  . Smoking status: Former Smoker    Quit date: 02/29/1968  . Smokeless tobacco: Never Used  . Alcohol Use: No  . Drug Use: No  . Sexually Active: Not on file   Other Topics Concern  . Not on file   Social History Narrative   Husband is fighting cancer. Exercises on stationary bike/walks   Daily caffeine     Family History  Problem Relation Age of Onset  . Stroke Mother     CVA  . Alzheimer's disease Mother   . Lung cancer Father   . Hypertension Brother   . Esophageal cancer Son 33  . Colon cancer Neg Hx   . Rectal cancer Neg Hx   . Stomach cancer Son 76    Allergies  Allergen Reactions  . Aspirin     REACTION: bleeds too easily-  . Atorvastatin     REACTION: elevated LFT's   . Hydrocodone     REACTION: makes her loopy  . Tetanus Toxoid     REACTION: rash    Medication list has been reviewed and updated.  Outpatient Prescriptions Prior to Visit  Medication Sig Dispense Refill  . acetaminophen (TYLENOL) 325 MG tablet Take 325 mg by mouth. OTC as directed       . Bimatoprost (LUMIGAN) 0.01 % SOLN One drop each eye daily       . glucose blood test strip To check sugar once daily and as needed for diabetes 250.0  100 each  2  . glucose monitoring kit (FREESTYLE) monitoring kit 1 each. To check sugar once daily and as needed for diabetes 250.0       . lisinopril (PRINIVIL,ZESTRIL) 10 MG tablet Take 1 tablet (10 mg total) by mouth daily.  90 tablet  3  . omeprazole (PRILOSEC) 20 MG capsule Take 1 capsule (20 mg total) by mouth daily.  90 capsule  3  . oxybutynin (DITROPAN XL) 15 MG 24 hr tablet Take 15 mg by mouth daily.        . pravastatin (PRAVACHOL) 40 MG tablet Take 1 tablet (40 mg total) by mouth daily.  90 tablet  3  . prednisoLONE acetate (OMNIPRED) 1 % ophthalmic suspension One drop in each eye daily        No facility-administered medications prior to visit.    Review of Systems:   GEN: No fevers, chills. Nontoxic. Primarily MSK c/o today. MSK: Detailed in the HPI GI: tolerating PO intake without difficulty Neuro: No numbness, parasthesias, or tingling associated. Otherwise the pertinent positives of the ROS are noted above.    Physical Examination: BP 120/62  Pulse 72  Temp(Src) 98.1 F (36.7 C)  Wt 106 lb (48.081 kg)  BMI 19.86 kg/m2  LMP 02/29/1968  Ideal Body Weight:     GEN: Well-developed,well-nourished,in no acute distress; alert,appropriate and cooperative throughout examination HEENT: Normocephalic and atraumatic without obvious abnormalities. Ears, externally no deformities PULM: Breathing comfortably in no respiratory distress EXT: No clubbing, cyanosis, or edema PSYCH: Normally interactive. Cooperative during the  interview. Pleasant. Friendly and conversant. Not anxious or depressed appearing. Normal, full affect.  Shoulder: L Inspection: No muscle wasting or winging Ecchymosis/edema: neg  AC joint, scapula, clavicle: NT Cervical spine: NT, full ROM Spurling's: neg Abduction: full, 5/5 Flexion: full, 5/5 IR, full, lift-off: 5/5 ER at neutral: full, 5/5 AC crossover: pos Neer: pos Hawkins: pos Drop Test: neg Empty Can: pos Supraspinatus insertion: mild-mod  T Bicipital groove: NT Speed's: pos Yergason's: neg Sulcus sign: neg Scapular dyskinesis: none C5-T1 intact  Neuro: Sensation intact Grip 5/5   Assessment and Plan:  Rotator cuff tendonitis, left - Plan: DG Shoulder Left, DG Shoulder Right  Impingement syndrome, shoulder, left - Plan: DG Shoulder Left, DG Shoulder Right  Pain in joint, shoulder region, left  >25 minutes spent in face to face time with patient, >50% spent in counselling or coordination of care  Shoulder anatomy was reviewed with the patient using and anatomical model.   Rotator cuff strengthening and scapular stabilization exercises were Retraining shoulder mechanics and function was emphasized to the patient with rehab done at least 5-6 days a week. The patient could benefit from formal PT to assist with scapular stabilization and RTC strengthening - she does not want to do any PT right now due to health issues with husband.  SubAC Injection, LEFT Verbal consent was obtained from the patient. Risks (including rare infection), benefits, and alternatives were explained. Patient prepped with Chloraprep and Ethyl Chloride used for anesthesia. The subacromial space was injected using the posterior approach. The patient tolerated the procedure well and had decreased pain post injection. No complications. Injection: 8 cc of Lidocaine 1% and 2 cc of Depo-Medrol 40 mg. Needle: 22 gauge    Orders Today:  Orders Placed This Encounter  Procedures  . DG Shoulder Left     Standing Status: Future     Number of Occurrences: 1     Standing Expiration Date: 08/11/2013    Order Specific Question:  Preferred imaging location?    Answer:  [facility]    Order Specific Question:  Reason for exam:    Answer:  shoulder pain  . DG Shoulder Right    Standing Status: Future     Number of Occurrences: 1     Standing Expiration Date: 08/11/2013    Order Specific Question:  Preferred imaging location?    Answer:  [facility]    Order Specific Question:  Reason for exam:    Answer:  shoulder pain    Updated Medication List: (Includes new medications, updates to list, dose adjustments) No orders of the defined types were placed in this encounter.    Medications Discontinued: There are no discontinued medications.    Signed, Elpidio Galea. English Craighead, MD 06/11/2012 11:45 AM

## 2012-06-21 DIAGNOSIS — H40009 Preglaucoma, unspecified, unspecified eye: Secondary | ICD-10-CM | POA: Diagnosis not present

## 2012-11-29 ENCOUNTER — Other Ambulatory Visit: Payer: Self-pay | Admitting: Family Medicine

## 2012-11-30 NOTE — Telephone Encounter (Signed)
Pt wanted to ck on status lisinopril refill. Advised would send one refill now; pt has CPX scheduled 12/10/12.

## 2012-12-05 ENCOUNTER — Telehealth: Payer: Self-pay | Admitting: Family Medicine

## 2012-12-05 DIAGNOSIS — I10 Essential (primary) hypertension: Secondary | ICD-10-CM

## 2012-12-05 DIAGNOSIS — D72819 Decreased white blood cell count, unspecified: Secondary | ICD-10-CM

## 2012-12-05 DIAGNOSIS — E785 Hyperlipidemia, unspecified: Secondary | ICD-10-CM

## 2012-12-05 DIAGNOSIS — R7402 Elevation of levels of lactic acid dehydrogenase (LDH): Secondary | ICD-10-CM

## 2012-12-05 NOTE — Telephone Encounter (Signed)
Message copied by Judy Pimple on Wed Dec 05, 2012  7:46 AM ------      Message from: Alvina Chou      Created: Wed Nov 28, 2012 11:49 AM      Regarding: Lab orders for Thursday, 10.9.14       Patient is scheduled for CPX labs, please order future labs, Thanks , Terri       ------

## 2012-12-06 ENCOUNTER — Other Ambulatory Visit (INDEPENDENT_AMBULATORY_CARE_PROVIDER_SITE_OTHER): Payer: Medicare Other

## 2012-12-06 DIAGNOSIS — R7401 Elevation of levels of liver transaminase levels: Secondary | ICD-10-CM | POA: Diagnosis not present

## 2012-12-06 DIAGNOSIS — R7402 Elevation of levels of lactic acid dehydrogenase (LDH): Secondary | ICD-10-CM

## 2012-12-06 DIAGNOSIS — E785 Hyperlipidemia, unspecified: Secondary | ICD-10-CM | POA: Diagnosis not present

## 2012-12-06 DIAGNOSIS — I10 Essential (primary) hypertension: Secondary | ICD-10-CM

## 2012-12-06 DIAGNOSIS — D72819 Decreased white blood cell count, unspecified: Secondary | ICD-10-CM

## 2012-12-06 LAB — CBC WITH DIFFERENTIAL/PLATELET
Basophils Absolute: 0 10*3/uL (ref 0.0–0.1)
Basophils Relative: 0.6 % (ref 0.0–3.0)
Eosinophils Absolute: 0.1 10*3/uL (ref 0.0–0.7)
Eosinophils Relative: 2.6 % (ref 0.0–5.0)
HCT: 37 % (ref 36.0–46.0)
Hemoglobin: 12.7 g/dL (ref 12.0–15.0)
Lymphocytes Relative: 36.6 % (ref 12.0–46.0)
Lymphs Abs: 1.9 10*3/uL (ref 0.7–4.0)
MCHC: 34.3 g/dL (ref 30.0–36.0)
MCV: 90.8 fl (ref 78.0–100.0)
Monocytes Absolute: 0.4 10*3/uL (ref 0.1–1.0)
Monocytes Relative: 8.1 % (ref 3.0–12.0)
Neutro Abs: 2.7 10*3/uL (ref 1.4–7.7)
Neutrophils Relative %: 52.1 % (ref 43.0–77.0)
Platelets: 156 10*3/uL (ref 150.0–400.0)
RBC: 4.07 Mil/uL (ref 3.87–5.11)
RDW: 13.3 % (ref 11.5–14.6)
WBC: 5.2 10*3/uL (ref 4.5–10.5)

## 2012-12-06 LAB — COMPREHENSIVE METABOLIC PANEL
ALT: 17 U/L (ref 0–35)
AST: 19 U/L (ref 0–37)
Albumin: 4 g/dL (ref 3.5–5.2)
Alkaline Phosphatase: 87 U/L (ref 39–117)
BUN: 11 mg/dL (ref 6–23)
CO2: 27 mEq/L (ref 19–32)
Calcium: 9.5 mg/dL (ref 8.4–10.5)
Chloride: 103 mEq/L (ref 96–112)
Creatinine, Ser: 0.7 mg/dL (ref 0.4–1.2)
GFR: 88.31 mL/min (ref >60.00)
Glucose, Bld: 96 mg/dL (ref 70–99)
Potassium: 4.1 mEq/L (ref 3.5–5.1)
Sodium: 139 mEq/L (ref 135–145)
Total Bilirubin: 0.6 mg/dL (ref 0.3–1.2)
Total Protein: 7.1 g/dL (ref 6.0–8.3)

## 2012-12-06 LAB — LIPID PANEL
Cholesterol: 180 mg/dL (ref 0–200)
HDL: 47 mg/dL (ref >39.00)
LDL Cholesterol: 102 mg/dL — ABNORMAL HIGH (ref 0–99)
Total CHOL/HDL Ratio: 4
Triglycerides: 157 mg/dL — ABNORMAL HIGH (ref 0.0–149.0)
VLDL: 31.4 mg/dL (ref 0.0–40.0)

## 2012-12-06 LAB — TSH: TSH: 1.44 u[IU]/mL (ref 0.35–5.50)

## 2012-12-10 ENCOUNTER — Ambulatory Visit (INDEPENDENT_AMBULATORY_CARE_PROVIDER_SITE_OTHER): Payer: Medicare Other | Admitting: Family Medicine

## 2012-12-10 ENCOUNTER — Encounter: Payer: Self-pay | Admitting: Family Medicine

## 2012-12-10 VITALS — BP 122/64 | HR 79 | Temp 98.4°F | Ht 60.5 in | Wt 112.8 lb

## 2012-12-10 DIAGNOSIS — Z23 Encounter for immunization: Secondary | ICD-10-CM

## 2012-12-10 DIAGNOSIS — E785 Hyperlipidemia, unspecified: Secondary | ICD-10-CM | POA: Diagnosis not present

## 2012-12-10 DIAGNOSIS — Z0000 Encounter for general adult medical examination without abnormal findings: Secondary | ICD-10-CM

## 2012-12-10 DIAGNOSIS — I10 Essential (primary) hypertension: Secondary | ICD-10-CM

## 2012-12-10 MED ORDER — OMEPRAZOLE 20 MG PO CPDR: 20.0000 mg | DELAYED_RELEASE_CAPSULE | Freq: Every day | ORAL | Status: DC

## 2012-12-10 MED ORDER — PRAVASTATIN SODIUM 40 MG PO TABS: 40.0000 mg | ORAL_TABLET | Freq: Every day | ORAL | Status: DC

## 2012-12-10 MED ORDER — OXYBUTYNIN CHLORIDE ER 15 MG PO TB24: 15.0000 mg | ORAL_TABLET | Freq: Every day | ORAL | Status: DC

## 2012-12-10 MED ORDER — LISINOPRIL 10 MG PO TABS: 10.0000 mg | ORAL_TABLET | Freq: Every day | ORAL | Status: DC

## 2012-12-10 NOTE — Progress Notes (Signed)
Subjective:    Patient ID: Victoria Allison, female    DOB: February 25, 1941, 72 y.o.   MRN: [id number]  HPI I have personally reviewed the Medicare Annual Wellness questionnaire and have noted 1. The patient's medical and social history 2. Their use of alcohol, tobacco or illicit drugs 3. Their current medications and supplements 4. The patient's functional ability including ADL's, fall risks, home safety risks and hearing or visual             impairment. 5. Diet and physical activities 6. Evidence for depression or mood disorders  The patients weight, height, BMI have been recorded in the chart and visual acuity is per eye clinic.  I have made referrals, counseling and provided education to the patient based review of the above and I have provided the pt with a written personalized care plan for preventive services.  Wt isup 6 lb with bmi of 21  Pemphigoid is followed by Kendell Bane  Had to have all her teeth pulled -and no gum problems since then  Getting used to the denture   See scanned forms.  Routine anticipatory guidance given to patient.  See health maintenance. Flu vaccine - will get that today Shingles- had shingles but not the vaccine  PNA vaccine 10/10 Tetanus vaccine 4/10 Colon 6/10 - does not remember recall - ? 5 year  Breast cancer screening - does not want those any more ,  Or a breast exam Self exam- no lumps or problems  Hx of hysterectomy in the past/non cancer Advance directive-she does have a living will set up  Cognitive function addressed- see scanned forms- and if abnormal then additional documentation follows. - nothing new or worrisome  No falls No fx Nl mood- not depressed   Had dexa 5/10 and it was nl  No fractures   Hyperlipidemia Lab Results  Component Value Date   CHOL 180 12/06/2012   CHOL 163 12/01/2011   CHOL 190 02/09/2011   Lab Results  Component Value Date   HDL 47.00 12/06/2012   HDL 78.29 12/01/2011   HDL 56.21 02/09/2011   Lab  Results  Component Value Date   LDLCALC 102* 12/06/2012   LDLCALC 99 12/01/2011   LDLCALC 104* 02/09/2011   Lab Results  Component Value Date   TRIG 157.0* 12/06/2012   TRIG 106.0 12/01/2011   TRIG 152.0* 02/09/2011   Lab Results  Component Value Date   CHOLHDL 4 12/06/2012   CHOLHDL 4 12/01/2011   CHOLHDL 3 02/09/2011   Lab Results  Component Value Date   LDLDIRECT 184.0 11/25/2010   LDLDIRECT 86.7 02/04/2009   on pravachol and diet - pretty well controlled   bp is stable today  No cp or palpitations or headaches or edema  No side effects to medicines  BP Readings from Last 3 Encounters:  12/10/12 122/64  06/11/12 120/62  06/08/12 120/70       PMH and SH reviewed  Meds, vitals, and allergies reviewed.   ROS: See HPI.  Otherwise negative.    Patient Active Problem List   Diagnosis Date Noted  . Encounter for Medicare annual wellness exam 12/10/2012  . Leukopenia 12/09/2011  . Esophageal rings 08/24/2011  . TRANSAMINASES, SERUM, ELEVATED 02/09/2010  . SUPRAVENTRICULAR TACHYCARDIA 09/22/2009  . ARTHRITIS, SHOULDER 05/21/2009  . INSOMNIA 02/05/2009  . POSTMENOPAUSAL STATUS 06/02/2008  . PEMPHIGOID 11/14/2007  . Nonspecific (abnormal) findings on radiological and other examination of body structure 07/05/2007  . HYPERLIPIDEMIA 03/13/2007  . HYPERTENSION 03/13/2007  .  CORONARY ARTERY DISEASE 03/13/2007  . ALLERGIC RHINITIS 03/13/2007  . GERD 03/13/2007  . HAIR LOSS 03/13/2007  . LEG PAIN, CHRONIC 03/13/2007  . URINARY INCONTINENCE 03/13/2007  . COLONIC POLYPS, HX OF 03/13/2007  . HX, URINARY INFECTION 08/14/2006   Past Medical History  Diagnosis Date  . Allergic rhinitis   . GERD (gastroesophageal reflux disease)   . CAD (coronary artery disease)   . Hyperlipemia   . Hypertension   . Urinary incontinence     OVERACTIVE BLADDER  . Lichen sclerosus 05/09    (DX BY GYN AFTER ABN PAP)  . Pemphigoid, cicatricial     D/O AFFECTING EYES, MOUTH, THROAT NOW (DX  [facility])  . Elevated LFTs   . History of hepatitis     unsure of type  . Colon polyp   . Diverticulosis     sigmoid and descending colon  . Internal hemorrhoids   . DM (diabetes mellitus)     no longer take any diabetic meds now that off Prednisone  . Chronic kidney disease     overactive bladder   Past Surgical History  Procedure Laterality Date  . Oophorectomy    . Angioplasty      LAD  . Nose surgery      Biopsy  . Cataract extraction    . Cardiac electrophysiology mapping and ablation    . Colonoscopy    . Abdominal hysterectomy    . Esophagogastroduodenoscopy  09/14/2011    Procedure: ESOPHAGOGASTRODUODENOSCOPY (EGD);  Surgeon: Iva Boop, MD;  Location: [facility];  Service: Endoscopy;  Laterality: N/A;  needs xray  . Savory dilation  09/14/2011    Procedure: SAVORY DILATION;  Surgeon: Iva Boop, MD;  Location: [facility];  Service: Endoscopy;  Laterality: N/A;   History  Substance Use Topics  . Smoking status: Former Smoker    Quit date: 02/29/1968  . Smokeless tobacco: Never Used  . Alcohol Use: No   Family History  Problem Relation Age of Onset  . Stroke Mother     CVA  . Alzheimer's disease Mother   . Lung cancer Father   . Hypertension Brother   . Esophageal cancer Son 28  . Colon cancer Neg Hx   . Rectal cancer Neg Hx   . Stomach cancer Son 4   Allergies  Allergen Reactions  . Aspirin     REACTION: bleeds too easily-  . Atorvastatin     REACTION: elevated LFT's  . Hydrocodone     REACTION: makes her loopy  . Tetanus Toxoid     REACTION: rash   Current Outpatient Prescriptions on File Prior to Visit  Medication Sig Dispense Refill  . acetaminophen (TYLENOL) 325 MG tablet Take 325 mg by mouth. OTC as directed       . Bimatoprost (LUMIGAN) 0.01 % SOLN One drop each eye daily       . lisinopril (PRINIVIL,ZESTRIL) 10 MG tablet Take one tablet by mouth one time daily  90 tablet  0  . omeprazole (PRILOSEC) 20 MG capsule Take 1 capsule (20  mg total) by mouth daily.  90 capsule  3  . oxybutynin (DITROPAN XL) 15 MG 24 hr tablet Take 15 mg by mouth daily.        . pravastatin (PRAVACHOL) 40 MG tablet Take 1 tablet (40 mg total) by mouth daily.  90 tablet  3  . prednisoLONE acetate (OMNIPRED) 1 % ophthalmic suspension One drop in each eye daily  No current facility-administered medications on file prior to visit.     Review of Systems Review of Systems  Constitutional: Negative for fever, appetite change, fatigue and unexpected weight change.  Eyes: Negative for pain and visual disturbance.  ENT pos for mouth pain/ getting used to dentures/ neg for ulcerations in mouth  Respiratory: Negative for cough and shortness of breath.   Cardiovascular: Negative for cp or palpitations    Gastrointestinal: Negative for nausea, diarrhea and constipation.  Genitourinary: Negative for urgency and frequency.  Skin: Negative for pallor or rash   Neurological: Negative for weakness, light-headedness, numbness and headaches.  Hematological: Negative for adenopathy. Does not bruise/bleed easily.  Psychiatric/Behavioral: Negative for dysphoric mood. The patient is not nervous/anxious.         Objective:   Physical Exam  Constitutional: She appears well-developed and well-nourished. No distress.  HENT:  Head: Normocephalic and atraumatic.  Right Ear: External ear normal.  Left Ear: External ear normal.  Nose: Nose normal.  Mouth/Throat: Oropharynx is clear and moist.  Eyes: Conjunctivae and EOM are normal. Pupils are equal, round, and reactive to light. Right eye exhibits no discharge. Left eye exhibits no discharge. No scleral icterus.  Neck: Normal range of motion. Neck supple. No JVD present. Carotid bruit is not present. No thyromegaly present.  Cardiovascular: Normal rate, regular rhythm, normal heart sounds and intact distal pulses.  Exam reveals no gallop.   Pulmonary/Chest: Effort normal and breath sounds normal. No  respiratory distress. She has no wheezes. She exhibits no tenderness.  Abdominal: Soft. Bowel sounds are normal. She exhibits no distension, no abdominal bruit and no mass. There is no tenderness.  Musculoskeletal: She exhibits no edema and no tenderness.  Lymphadenopathy:    She has no cervical adenopathy.  Neurological: She is alert. She has normal reflexes. No cranial nerve deficit. She exhibits normal muscle tone. Coordination normal.  Skin: Skin is warm and dry. No rash noted. No erythema. No pallor.  Psychiatric: She has a normal mood and affect.          Assessment & Plan:

## 2012-12-10 NOTE — Patient Instructions (Signed)
Flu vaccine today  If you are interested in a shingles/zoster vaccine - call your insurance to check on coverage,( you should not get it within 1 month of other vaccines) , then call us for a prescription  for it to take to a pharmacy that gives the shot , or make a nurse visit to get it here depending on your coverage Keep taking good care of yourself  Labs are stable  Stay active

## 2012-12-10 NOTE — Assessment & Plan Note (Signed)
Reviewed health habits including diet and exercise and skin cancer prevention Also reviewed health mt list, fam hx and immunizations   Wellness labs rev Flu shot today Pt declines breast cancer screening at her age  Will check re: shingles vaccine with ins - but doubts she can afford that

## 2012-12-10 NOTE — Assessment & Plan Note (Signed)
Disc goals for lipids and reasons to control them Rev labs with pt Rev low sat fat diet in detail Controlled with statin and diet  

## 2012-12-10 NOTE — Assessment & Plan Note (Signed)
bp in fair control at this time  No changes needed  Disc lifstyle change with low sodium diet and exercise  Labs reviewed  

## 2012-12-18 DIAGNOSIS — H4010X0 Unspecified open-angle glaucoma, stage unspecified: Secondary | ICD-10-CM | POA: Diagnosis not present

## 2012-12-20 ENCOUNTER — Encounter: Payer: Self-pay | Admitting: Internal Medicine

## 2012-12-20 ENCOUNTER — Ambulatory Visit (INDEPENDENT_AMBULATORY_CARE_PROVIDER_SITE_OTHER): Payer: Medicare Other | Admitting: Internal Medicine

## 2012-12-20 VITALS — BP 128/60 | HR 64 | Ht 61.25 in | Wt 113.2 lb

## 2012-12-20 DIAGNOSIS — R1314 Dysphagia, pharyngoesophageal phase: Secondary | ICD-10-CM

## 2012-12-20 DIAGNOSIS — K222 Esophageal obstruction: Secondary | ICD-10-CM | POA: Diagnosis not present

## 2012-12-20 DIAGNOSIS — R1319 Other dysphagia: Secondary | ICD-10-CM

## 2012-12-20 DIAGNOSIS — R1310 Dysphagia, unspecified: Secondary | ICD-10-CM

## 2012-12-20 NOTE — Patient Instructions (Signed)
You have been scheduled for an endoscopy at [facility]. Please follow written instructions given to you at your visit today. If you use inhalers (even only as needed), please bring them with you on the day of your procedure. Your physician has requested that you go to www.[email or web address].com and enter the access code given to you at your visit today. This web site gives a general overview about your procedure. However, you should still follow specific instructions given to you by our office regarding your preparation for the procedure.   I appreciate the opportunity to care for you.

## 2012-12-20 NOTE — Progress Notes (Signed)
Subjective:    Patient ID: Victoria Allison, female    DOB: 12/08/1940, 72 y.o.   MRN: [id number]  HPI the patient is a delightful elderly woman with pemphigoid and multiple esophageal rings causing dysphagia. I dilated her in July of 2013, initially with balloon and then using a Maloney dilation. Since the Maloney dilation of 44 [place] she's enjoyed much improved swallowing without dysphagia until about the last month or so. She feels like there is a need for repeat dilation because of the recurrent swallowing problems. Wt Readings from Last 3 Encounters:  12/20/12 113 lb 3.2 oz (51.347 kg)  12/10/12 112 lb 12 oz (51.143 kg)  06/11/12 106 lb (48.081 kg)   Allergies  Allergen Reactions  . Aspirin     REACTION: bleeds too easily-  . Atorvastatin     REACTION: elevated LFT's  . Hydrocodone     REACTION: makes her loopy  . Tetanus Toxoid     REACTION: rash   Outpatient Prescriptions Prior to Visit  Medication Sig Dispense Refill  . acetaminophen (TYLENOL) 325 MG tablet Take 325 mg by mouth. OTC as directed       . Bimatoprost (LUMIGAN) 0.01 % SOLN One drop each eye daily       . lisinopril (PRINIVIL,ZESTRIL) 10 MG tablet Take 1 tablet (10 mg total) by mouth daily.  90 tablet  3  . omeprazole (PRILOSEC) 20 MG capsule Take 1 capsule (20 mg total) by mouth daily.  90 capsule  3  . oxybutynin (DITROPAN XL) 15 MG 24 hr tablet Take 1 tablet (15 mg total) by mouth daily.  90 tablet  3  . pravastatin (PRAVACHOL) 40 MG tablet Take 1 tablet (40 mg total) by mouth daily.  90 tablet  3  . prednisoLONE acetate (OMNIPRED) 1 % ophthalmic suspension One drop in each eye daily        No facility-administered medications prior to visit.   Past Medical History  Diagnosis Date  . Allergic rhinitis   . GERD (gastroesophageal reflux disease)   . CAD (coronary artery disease)   . Hyperlipemia   . Hypertension   . Urinary incontinence     OVERACTIVE BLADDER  . Lichen sclerosus 05/09    (DX BY GYN  AFTER ABN PAP)  . Pemphigoid, cicatricial     D/O AFFECTING EYES, MOUTH, THROAT NOW (DX [facility])  . Elevated LFTs   . History of hepatitis     unsure of type  . Colon polyp   . Diverticulosis     sigmoid and descending colon  . Internal hemorrhoids   . DM (diabetes mellitus)     no longer take any diabetic meds now that off Prednisone  . Chronic kidney disease     overactive bladder   Past Surgical History  Procedure Laterality Date  . Oophorectomy    . Angioplasty      LAD  . Nose surgery      Biopsy  . Cataract extraction    . Cardiac electrophysiology mapping and ablation    . Colonoscopy    . Abdominal hysterectomy    . Esophagogastroduodenoscopy  09/14/2011    Procedure: ESOPHAGOGASTRODUODENOSCOPY (EGD);  Surgeon: Marieanne Marxen E Arthea Nobel, MD;  Location: [facility];  Service: Endoscopy;  Laterality: N/A;  needs xray  . Savory dilation  09/14/2011    Procedure: SAVORY DILATION;  Surgeon: Zamarion Longest E Kenitra Leventhal, MD;  Location: [facility];  Service: Endoscopy;  Laterality: N/A;   History   Social History  .   Marital Status: Married    Spouse Name: N/A    Number of Children: 2  . Years of Education: N/A   Occupational History  . Retired    Social History Main Topics  . Smoking status: Former Smoker    Quit date: 02/29/1968  . Smokeless tobacco: Never Used  . Alcohol Use: No  . Drug Use: No  . Sexual Activity: None   Other Topics Concern  . None   Social History Narrative   Husband is fighting cancer. Exercises on stationary bike/walks   Daily caffeine    Family History  Problem Relation Age of Onset  . Stroke Mother     CVA  . Alzheimer's disease Mother   . Lung cancer Father   . Hypertension Brother   . Esophageal cancer Son 50  . Colon cancer Neg Hx   . Rectal cancer Neg Hx   . Stomach cancer Son 50     Review of Systems She does have some dry the left, but no visual disturbance from the pemphigoid. She is struggling with new dentures, she had gum problems from  the pemphigoid and required implants and dentures.    Objective:   Physical Exam General:  NAD Eyes:   anicteric Lungs:  clear Heart:  S1S2 no rubs, murmurs or gallops  Data Reviewed:  2013 endoscopy and GI notes     Assessment & Plan:   1. Esophageal rings   2. Esophageal dysphagia    I appreciate the opportunity to care for this patient. CC: Marne Tower, MD  

## 2012-12-20 NOTE — Assessment & Plan Note (Signed)
Recurrent dysphagia so need repeat dilation at hospital with flouro gudiance

## 2013-01-02 ENCOUNTER — Encounter (HOSPITAL_COMMUNITY)
Admission: RE | Disposition: A | Discharge status: Home / Self Care | Payer: Self-pay | Source: Ambulatory Visit | Attending: Internal Medicine

## 2013-01-02 ENCOUNTER — Ambulatory Visit (HOSPITAL_COMMUNITY)
Admission: RE | Admit: 2013-01-02 | Discharge: 2013-01-02 | Disposition: A | Discharge status: Home / Self Care | Payer: Medicare Other | Source: Ambulatory Visit | Attending: Internal Medicine | Admitting: Internal Medicine

## 2013-01-02 ENCOUNTER — Ambulatory Visit (HOSPITAL_COMMUNITY): Payer: Medicare Other

## 2013-01-02 ENCOUNTER — Encounter (HOSPITAL_COMMUNITY): Payer: Self-pay | Admitting: *Deleted

## 2013-01-02 DIAGNOSIS — K219 Gastro-esophageal reflux disease without esophagitis: Secondary | ICD-10-CM | POA: Diagnosis not present

## 2013-01-02 DIAGNOSIS — R1310 Dysphagia, unspecified: Secondary | ICD-10-CM

## 2013-01-02 DIAGNOSIS — R1319 Other dysphagia: Secondary | ICD-10-CM

## 2013-01-02 DIAGNOSIS — L129 Pemphigoid, unspecified: Secondary | ICD-10-CM | POA: Diagnosis not present

## 2013-01-02 DIAGNOSIS — E785 Hyperlipidemia, unspecified: Secondary | ICD-10-CM | POA: Insufficient documentation

## 2013-01-02 DIAGNOSIS — E119 Type 2 diabetes mellitus without complications: Secondary | ICD-10-CM | POA: Diagnosis not present

## 2013-01-02 DIAGNOSIS — R1314 Dysphagia, pharyngoesophageal phase: Secondary | ICD-10-CM

## 2013-01-02 DIAGNOSIS — I10 Essential (primary) hypertension: Secondary | ICD-10-CM | POA: Diagnosis not present

## 2013-01-02 DIAGNOSIS — K222 Esophageal obstruction: Secondary | ICD-10-CM | POA: Diagnosis not present

## 2013-01-02 DIAGNOSIS — I2510 Atherosclerotic heart disease of native coronary artery without angina pectoris: Secondary | ICD-10-CM | POA: Diagnosis not present

## 2013-01-02 HISTORY — PX: MALONEY DILATION: SHX5535

## 2013-01-02 HISTORY — PX: SAVORY DILATION: SHX5439

## 2013-01-02 HISTORY — PX: ESOPHAGOGASTRODUODENOSCOPY: SHX5428

## 2013-01-02 SURGERY — EGD (ESOPHAGOGASTRODUODENOSCOPY)
Anesthesia: Moderate Sedation

## 2013-01-02 MED ORDER — FENTANYL CITRATE 0.05 MG/ML IJ SOLN
INTRAMUSCULAR | Status: DC | PRN
  Administered 2013-01-02 (×2): 25 ug via INTRAVENOUS

## 2013-01-02 MED ORDER — SODIUM CHLORIDE 0.9 % IV SOLN
INTRAVENOUS | Status: DC
  Administered 2013-01-02: 500 mL via INTRAVENOUS

## 2013-01-02 MED ORDER — FENTANYL CITRATE 0.05 MG/ML IJ SOLN
INTRAMUSCULAR | Status: AC
  Filled 2013-01-02: qty 2

## 2013-01-02 MED ORDER — MIDAZOLAM HCL 10 MG/2ML IJ SOLN
INTRAMUSCULAR | Status: DC | PRN
  Administered 2013-01-02 (×2): 2 mg via INTRAVENOUS

## 2013-01-02 MED ORDER — OMEPRAZOLE 40 MG PO CPDR: 20.0000 mg | DELAYED_RELEASE_CAPSULE | Freq: Every day | ORAL | Status: DC

## 2013-01-02 MED ORDER — MIDAZOLAM HCL 10 MG/2ML IJ SOLN
INTRAMUSCULAR | Status: AC
  Filled 2013-01-02: qty 2

## 2013-01-02 NOTE — H&P (View-Only) (Signed)
Subjective:    Patient ID: Victoria Allison, female    DOB: May 18, 1940, 72 y.o.   MRN: [id number]  HPI the patient is a delightful elderly woman with pemphigoid and multiple esophageal rings causing dysphagia. I dilated her in July of 2013, initially with balloon and then using a Maloney dilation. Since the [facility] dilation of 44 [place] she's enjoyed much improved swallowing without dysphagia until about the last month or so. She feels like there is a need for repeat dilation because of the recurrent swallowing problems. Wt Readings from Last 3 Encounters:  12/20/12 113 lb 3.2 oz (51.347 kg)  12/10/12 112 lb 12 oz (51.143 kg)  06/11/12 106 lb (48.081 kg)   Allergies  Allergen Reactions  . Aspirin     REACTION: bleeds too easily-  . Atorvastatin     REACTION: elevated LFT's  . Hydrocodone     REACTION: makes her loopy  . Tetanus Toxoid     REACTION: rash   Outpatient Prescriptions Prior to Visit  Medication Sig Dispense Refill  . acetaminophen (TYLENOL) 325 MG tablet Take 325 mg by mouth. OTC as directed       . Bimatoprost (LUMIGAN) 0.01 % SOLN One drop each eye daily       . lisinopril (PRINIVIL,ZESTRIL) 10 MG tablet Take 1 tablet (10 mg total) by mouth daily.  90 tablet  3  . omeprazole (PRILOSEC) 20 MG capsule Take 1 capsule (20 mg total) by mouth daily.  90 capsule  3  . oxybutynin (DITROPAN XL) 15 MG 24 hr tablet Take 1 tablet (15 mg total) by mouth daily.  90 tablet  3  . pravastatin (PRAVACHOL) 40 MG tablet Take 1 tablet (40 mg total) by mouth daily.  90 tablet  3  . prednisoLONE acetate (OMNIPRED) 1 % ophthalmic suspension One drop in each eye daily        No facility-administered medications prior to visit.   Past Medical History  Diagnosis Date  . Allergic rhinitis   . GERD (gastroesophageal reflux disease)   . CAD (coronary artery disease)   . Hyperlipemia   . Hypertension   . Urinary incontinence     OVERACTIVE BLADDER  . Lichen sclerosus 05/09    (DX BY GYN  AFTER ABN PAP)  . Pemphigoid, cicatricial     D/O AFFECTING EYES, MOUTH, THROAT NOW (DX [facility])  . Elevated LFTs   . History of hepatitis     unsure of type  . Colon polyp   . Diverticulosis     sigmoid and descending colon  . Internal hemorrhoids   . DM (diabetes mellitus)     no longer take any diabetic meds now that off Prednisone  . Chronic kidney disease     overactive bladder   Past Surgical History  Procedure Laterality Date  . Oophorectomy    . Angioplasty      LAD  . Nose surgery      Biopsy  . Cataract extraction    . Cardiac electrophysiology mapping and ablation    . Colonoscopy    . Abdominal hysterectomy    . Esophagogastroduodenoscopy  09/14/2011    Procedure: ESOPHAGOGASTRODUODENOSCOPY (EGD);  Surgeon: Iva Boop, MD;  Location: [facility];  Service: Endoscopy;  Laterality: N/A;  needs xray  . Savory dilation  09/14/2011    Procedure: SAVORY DILATION;  Surgeon: Iva Boop, MD;  Location: [facility];  Service: Endoscopy;  Laterality: N/A;   History   Social History  .  Marital Status: Married    Spouse Name: N/A    Number of Children: 2  . Years of Education: N/A   Occupational History  . Retired    Social History Main Topics  . Smoking status: Former Smoker    Quit date: 02/29/1968  . Smokeless tobacco: Never Used  . Alcohol Use: No  . Drug Use: No  . Sexual Activity: None   Other Topics Concern  . None   Social History Narrative   Husband is fighting cancer. Exercises on stationary bike/walks   Daily caffeine    Family History  Problem Relation Age of Onset  . Stroke Mother     CVA  . Alzheimer's disease Mother   . Lung cancer Father   . Hypertension Brother   . Esophageal cancer Son 75  . Colon cancer Neg Hx   . Rectal cancer Neg Hx   . Stomach cancer Son 52     Review of Systems She does have some dry the left, but no visual disturbance from the pemphigoid. She is struggling with new dentures, she had gum problems from  the pemphigoid and required implants and dentures.    Objective:   Physical Exam General:  NAD Eyes:   anicteric Lungs:  clear Heart:  S1S2 no rubs, murmurs or gallops  Data Reviewed:  2013 endoscopy and GI notes     Assessment & Plan:   1. Esophageal rings   2. Esophageal dysphagia    I appreciate the opportunity to care for this patient. CC: Roxy Manns, MD

## 2013-01-02 NOTE — Interval H&P Note (Signed)
History and Physical Interval Note:  01/02/2013 1:38 PM  Victoria Allison  has presented today for surgery, with the diagnosis of Dysphagia [787.20]  The various methods of treatment have been discussed with the patient and family. After consideration of risks, benefits and other options for treatment, the patient has consented to  Procedure(s): ESOPHAGOGASTRODUODENOSCOPY (EGD) (N/A) MALONEY DILATION (N/A) SAVORY DILATION (N/A) as a surgical intervention .  The patient's history has been reviewed, patient examined, no change in status, stable for surgery.  I have reviewed the patient's chart and labs.  Questions were answered to the patient's satisfaction.     Iva Boop, MD, [facility]

## 2013-01-02 NOTE — Op Note (Signed)
[facility] [address] [place], [address]   ENDOSCOPY PROCEDURE REPORT  PATIENT: Victoria Allison, Victoria Allison  MR#: [id number] BIRTHDATE: 1941/01/27 , 72  yrs. old GENDER: Female ENDOSCOPIST: Iva Boop, MD, [facility] PROCEDURE DATE:  01/02/2013 PROCEDURE:  Esophagoscopy and Maloney dilation of esophagus ASA CLASS:     Class II INDICATIONS:  Dysphagia.   Therapeutic procedure.  Dilate rings MEDICATIONS: Fentanyl 50 mcg IV and Versed 4 mg IV TOPICAL ANESTHETIC: Cetacaine Spray  DESCRIPTION OF PROCEDURE: After the risks benefits and alternatives of the procedure were thoroughly explained, informed consent was obtained.  The Pentax Gastroscope [id number] endoscope was introduced through the mouth and advanced to the stomach body. Without limitations.  The instrument was slowly withdrawn as the mucosa was fully examined.        ESOPHAGUS: Multiple rings were found in the middle third of the esophagus and lower third of the esophagus with a dominant ring at the GE junction  STOMACH: The mucosa of the stomach appeared normal - exam to body. Retroflexed views revealed no abnormalities.     The scope was then withdrawn from the patient and a 44 and then 48 Fr Maloney dilator was passed under fluoro with reinspecton showing successful dilations without problems. The procedure was then completed.  COMPLICATIONS: There were no complications. ENDOSCOPIC IMPRESSION: 1.   Rings were found  and dilated to 48 Fr in the middle third of the esophagus and lower third of the esophagus 2.   The mucosa of the stomach appeared normal - exam to body  RECOMMENDATIONS: 1.  Clear liquids until 4 PM, then soft foods rest oof day.  Resume prior diet tomorrow. 2.  continue PPI - increase omeprazole to 40 mg daily 3.   Call Dr.  Leone Payor in 1-2 weeks and tell him if swallowing is ok or not.  REPEAT EXAM: If needed  eSigned:  Iva Boop, MD, [facility] 01/02/2013 2:10 PM   CC:The  Patient

## 2013-01-03 ENCOUNTER — Encounter (HOSPITAL_COMMUNITY): Payer: Self-pay | Admitting: Internal Medicine

## 2013-03-13 ENCOUNTER — Ambulatory Visit (INDEPENDENT_AMBULATORY_CARE_PROVIDER_SITE_OTHER): Payer: Medicare Other | Admitting: Family Medicine

## 2013-03-13 ENCOUNTER — Encounter: Payer: Self-pay | Admitting: Family Medicine

## 2013-03-13 VITALS — BP 116/64 | HR 86 | Temp 98.5°F | Ht 61.25 in | Wt 114.5 lb

## 2013-03-13 DIAGNOSIS — R82998 Other abnormal findings in urine: Secondary | ICD-10-CM | POA: Diagnosis not present

## 2013-03-13 DIAGNOSIS — B029 Zoster without complications: Secondary | ICD-10-CM

## 2013-03-13 DIAGNOSIS — R300 Dysuria: Secondary | ICD-10-CM | POA: Diagnosis not present

## 2013-03-13 DIAGNOSIS — R8281 Pyuria: Secondary | ICD-10-CM

## 2013-03-13 DIAGNOSIS — Z8619 Personal history of other infectious and parasitic diseases: Secondary | ICD-10-CM | POA: Insufficient documentation

## 2013-03-13 HISTORY — DX: Zoster without complications: B02.9

## 2013-03-13 LAB — POCT URINALYSIS DIPSTICK
Bilirubin, UA: NEGATIVE
Glucose, UA: NEGATIVE
Ketones, UA: NEGATIVE
Nitrite, UA: NEGATIVE
Spec Grav, UA: 1.01
Urobilinogen, UA: 0.2
pH, UA: 6.5

## 2013-03-13 MED ORDER — VALACYCLOVIR HCL 1 G PO TABS: 1000.0000 mg | ORAL_TABLET | Freq: Three times a day (TID) | ORAL | Status: DC

## 2013-03-13 MED ORDER — TRAMADOL HCL 50 MG PO TABS: 50.0000 mg | ORAL_TABLET | Freq: Three times a day (TID) | ORAL | Status: DC | PRN

## 2013-03-13 MED ORDER — SULFAMETHOXAZOLE-TMP DS 800-160 MG PO TABS: 1.0000 | ORAL_TABLET | Freq: Two times a day (BID) | ORAL | Status: DC

## 2013-03-13 NOTE — Progress Notes (Signed)
Subjective:    Patient ID: Victoria Allison, female    DOB: 1940/09/19, 73 y.o.   MRN: [id number]  HPI Here with urinary symptoms and also ? About poss shingles   She has pain in her R buttock and labia- which radiates down her leg  Noticed a rash last night   Pain to urinate- 2-3 days  Frequency is not worse than usual  Baseline urgency ? If pain is just from the shingles on labia when urine hits the area  No nausea but no appetite either   Patient Active Problem List   Diagnosis Date Noted  . Encounter for Medicare annual wellness exam 12/10/2012  . Esophageal rings 08/24/2011  . SUPRAVENTRICULAR TACHYCARDIA 09/22/2009  . ARTHRITIS, SHOULDER 05/21/2009  . INSOMNIA 02/05/2009  . POSTMENOPAUSAL STATUS 06/02/2008  . PEMPHIGOID 11/14/2007  . Nonspecific (abnormal) findings on radiological and other examination of body structure 07/05/2007  . HYPERLIPIDEMIA 03/13/2007  . HYPERTENSION 03/13/2007  . CORONARY ARTERY DISEASE 03/13/2007  . ALLERGIC RHINITIS 03/13/2007  . GERD 03/13/2007  . HAIR LOSS 03/13/2007  . LEG PAIN, CHRONIC 03/13/2007  . URINARY INCONTINENCE 03/13/2007  . COLONIC POLYPS, HX OF 03/13/2007  . HX, URINARY INFECTION 08/14/2006   Past Medical History  Diagnosis Date  . Allergic rhinitis   . GERD (gastroesophageal reflux disease)   . CAD (coronary artery disease)   . Hyperlipemia   . Hypertension   . Urinary incontinence     OVERACTIVE BLADDER  . Lichen sclerosus 95/62    (DX BY GYN AFTER ABN PAP)  . Pemphigoid, cicatricial     D/O AFFECTING EYES, MOUTH, THROAT NOW (DX [facility])  . Elevated LFTs   . History of hepatitis     unsure of type  . Colon polyp   . Diverticulosis     sigmoid and descending colon  . Internal hemorrhoids   . DM (diabetes mellitus)     no longer take any diabetic meds now that off Prednisone  . Chronic kidney disease     overactive bladder   Past Surgical History  Procedure Laterality Date  . Oophorectomy    . Angioplasty       LAD  . Nose surgery      Biopsy  . Cataract extraction    . Cardiac electrophysiology mapping and ablation    . Colonoscopy    . Abdominal hysterectomy    . Esophagogastroduodenoscopy  09/14/2011    Procedure: ESOPHAGOGASTRODUODENOSCOPY (EGD);  Surgeon: Gatha Mayer, MD;  Location: [facility];  Service: Endoscopy;  Laterality: N/A;  needs xray  . Savory dilation  09/14/2011    Procedure: SAVORY DILATION;  Surgeon: Gatha Mayer, MD;  Location: [facility];  Service: Endoscopy;  Laterality: N/A;  . Esophagogastroduodenoscopy N/A 01/02/2013    Procedure: ESOPHAGOGASTRODUODENOSCOPY (EGD);  Surgeon: Gatha Mayer, MD;  Location: [facility];  Service: Endoscopy;  Laterality: N/A;  Venia Minks dilation N/A 01/02/2013    Procedure: Venia Minks DILATION;  Surgeon: Gatha Mayer, MD;  Location: [facility];  Service: Endoscopy;  Laterality: N/A;  . Savory dilation N/A 01/02/2013    Procedure: SAVORY DILATION;  Surgeon: Gatha Mayer, MD;  Location: [facility];  Service: Endoscopy;  Laterality: N/A;   History  Substance Use Topics  . Smoking status: Former Smoker    Quit date: 02/29/1968  . Smokeless tobacco: Never Used  . Alcohol Use: No   Family History  Problem Relation Age of Onset  . Stroke Mother  CVA  . Alzheimer's disease Mother   . Lung cancer Father   . Hypertension Brother   . Esophageal cancer Son 89  . Colon cancer Neg Hx   . Rectal cancer Neg Hx   . Stomach cancer Son 35   Allergies  Allergen Reactions  . Aspirin     REACTION: bleeds too easily-  . Atorvastatin     REACTION: elevated LFT's  . Hydrocodone     REACTION: makes her loopy  . Tetanus Toxoid     REACTION: rash   Current Outpatient Prescriptions on File Prior to Visit  Medication Sig Dispense Refill  . acetaminophen (TYLENOL) 325 MG tablet Take 325 mg by mouth. OTC as directed       . Bimatoprost (LUMIGAN) 0.01 % SOLN One drop each eye daily       . lisinopril (PRINIVIL,ZESTRIL) 10 MG tablet  Take 1 tablet (10 mg total) by mouth daily.  90 tablet  3  . omeprazole (PRILOSEC) 40 MG capsule Take 1 capsule (40 mg total) by mouth daily.  90 capsule  3  . oxybutynin (DITROPAN XL) 15 MG 24 hr tablet Take 1 tablet (15 mg total) by mouth daily.  90 tablet  3  . pravastatin (PRAVACHOL) 40 MG tablet Take 1 tablet (40 mg total) by mouth daily.  90 tablet  3  . prednisoLONE acetate (OMNIPRED) 1 % ophthalmic suspension One drop in each eye daily       . [DISCONTINUED] oxybutynin (DITROPAN XL) 15 MG 24 hr tablet Take 15 mg by mouth daily.         No current facility-administered medications on file prior to visit.      Review of Systems Review of Systems  Constitutional: Negative for fever, appetite change, fatigue and unexpected weight change.  Eyes: Negative for pain and visual disturbance.  Respiratory: Negative for cough and shortness of breath.   Cardiovascular: Negative for cp or palpitations    Gastrointestinal: Negative for nausea, diarrhea and constipation.  Genitourinary: Negative for urgency and frequency. pos for dysuria / neg for hematuria  Skin: Negative for pallor and pos for painful rash  Neurological: Negative for weakness, light-headedness, numbness and headaches.  Hematological: Negative for adenopathy. Does not bruise/bleed easily.  Psychiatric/Behavioral: Negative for dysphoric mood. The patient is not nervous/anxious.         Objective:   Physical Exam  Constitutional: She appears well-developed and well-nourished. No distress.  HENT:  Head: Normocephalic and atraumatic.  Mouth/Throat: Oropharynx is clear and moist.  Eyes: Conjunctivae and EOM are normal. Pupils are equal, round, and reactive to light. Right eye exhibits no discharge. Left eye exhibits no discharge.  Neck: Normal range of motion. Neck supple.  Cardiovascular: Normal rate and regular rhythm.   Pulmonary/Chest: Effort normal and breath sounds normal. No respiratory distress. She has no wheezes.    Genitourinary:  R labia majora is involved with swelling/ vesicles and redness   Lymphadenopathy:    She has no cervical adenopathy.  Neurological: She is alert. She exhibits normal muscle tone.  Skin: Skin is warm and dry. Rash noted.  Vesicular rash in S2 dermatome  Mild swelling of R labia with denudation of the skin  Urethra is mildly inflammed     Psychiatric: She has a normal mood and affect.          Assessment & Plan:

## 2013-03-13 NOTE — Progress Notes (Signed)
Pre-visit discussion using our clinic review tool. No additional management support is needed unless otherwise documented below in the visit note.  

## 2013-03-13 NOTE — Patient Instructions (Signed)
Take the valtrex for shingles- finish it all  Take the bactrim for possible urinary or skin infection  Use tramadol for pain as needed- use extreme caution for sedation and falls  Follow up with me in 7-10 days   Shingles Shingles (herpes zoster) is an infection that is caused by the same virus that causes chickenpox (varicella). The infection causes a painful skin rash and fluid-filled blisters, which eventually break open, crust over, and heal. It may occur in any area of the body, but it usually affects only one side of the body or face. The pain of shingles usually lasts about 1 month. However, some people with shingles may develop long-term (chronic) pain in the affected area of the body. Shingles often occurs many years after the person had chickenpox. It is more common:  In people older than 50 years.  In people with weakened immune systems, such as those with HIV, AIDS, or cancer.  In people taking medicines that weaken the immune system, such as transplant medicines.  In people under great stress. CAUSES  Shingles is caused by the varicella zoster virus (VZV), which also causes chickenpox. After a person is infected with the virus, it can remain in the person's body for years in an inactive state (dormant). To cause shingles, the virus reactivates and breaks out as an infection in a nerve root. The virus can be spread from person to person (contagious) through contact with open blisters of the shingles rash. It will only spread to people who have not had chickenpox. When these people are exposed to the virus, they may develop chickenpox. They will not develop shingles. Once the blisters scab over, the person is no longer contagious and cannot spread the virus to others. SYMPTOMS  Shingles shows up in stages. The initial symptoms may be pain, itching, and tingling in an area of the skin. This pain is usually described as burning, stabbing, or throbbing.In a few days or weeks, a painful  red rash will appear in the area where the pain, itching, and tingling were felt. The rash is usually on one side of the body in a band or belt-like pattern. Then, the rash usually turns into fluid-filled blisters. They will scab over and dry up in approximately 2 3 weeks. Flu-like symptoms may also occur with the initial symptoms, the rash, or the blisters. These may include:  Fever.  Chills.  Headache.  Upset stomach. DIAGNOSIS  Your caregiver will perform a skin exam to diagnose shingles. Skin scrapings or fluid samples may also be taken from the blisters. This sample will be examined under a microscope or sent to a lab for further testing. TREATMENT  There is no specific cure for shingles. Your caregiver will likely prescribe medicines to help you manage the pain, recover faster, and avoid long-term problems. This may include antiviral drugs, anti-inflammatory drugs, and pain medicines. HOME CARE INSTRUCTIONS   Take a cool bath or apply cool compresses to the area of the rash or blisters as directed. This may help with the pain and itching.   Only take over-the-counter or prescription medicines as directed by your caregiver.   Rest as directed by your caregiver.  Keep your rash and blisters clean with mild soap and cool water or as directed by your caregiver.  Do not pick your blisters or scratch your rash. Apply an anti-itch cream or numbing creams to the affected area as directed by your caregiver.  Keep your shingles rash covered with a loose bandage (  dressing).  Avoid skin contact with:  Babies.   Pregnant women.   Children with eczema.   Elderly people with transplants.   People with chronic illnesses, such as leukemia or AIDS.   Wear loose-fitting clothing to help ease the pain of material rubbing against the rash.  Keep all follow-up appointments with your caregiver.If the area involved is on your face, you may receive a referral for follow-up to a  specialist, such as an eye doctor (ophthalmologist) or an ear, nose, and throat (ENT) doctor. Keeping all follow-up appointments will help you avoid eye complications, chronic pain, or disability.  SEEK IMMEDIATE MEDICAL CARE IF:   You have facial pain, pain around the eye area, or loss of feeling on one side of your face.  You have ear pain or ringing in your ear.  You have loss of taste.  Your pain is not relieved with prescribed medicines.   Your redness or swelling spreads.   You have more pain and swelling.  Your condition is worsening or has changed.   You have a feveror persistent symptoms for more than 2 3 days.  You have a fever and your symptoms suddenly get worse. MAKE SURE YOU:  Understand these instructions.  Will watch your condition.  Will get help right away if you are not doing well or get worse. Document Released: 02/14/2005 Document Revised: 11/09/2011 Document Reviewed: 09/29/2011 [facility] Patient Information 2014 [place].

## 2013-03-14 LAB — POCT UA - MICROSCOPIC ONLY
Casts, Ur, LPF, POC: 0
Crystals, Ur, HPF, POC: 0
Yeast, UA: 0

## 2013-03-14 NOTE — Assessment & Plan Note (Signed)
Unsure if this is from uti or from shingles causing skin changes of labia with d/c Will cover with bactrim and follow closely

## 2013-03-14 NOTE — Assessment & Plan Note (Signed)
S2 distribution immunocomp pt who could not have shingles vaccine due to ongoing tx of pemphigoid Valtrex 7 d Tramadol with caution for pain  F/u planned  She will update if worse or new symptoms

## 2013-03-22 ENCOUNTER — Encounter: Payer: Self-pay | Admitting: Family Medicine

## 2013-03-22 ENCOUNTER — Ambulatory Visit (INDEPENDENT_AMBULATORY_CARE_PROVIDER_SITE_OTHER): Payer: Medicare Other | Admitting: Family Medicine

## 2013-03-22 VITALS — BP 110/70 | HR 81 | Temp 97.9°F | Ht 61.25 in | Wt 112.0 lb

## 2013-03-22 DIAGNOSIS — B029 Zoster without complications: Secondary | ICD-10-CM | POA: Diagnosis not present

## 2013-03-22 MED ORDER — HYDROCODONE-ACETAMINOPHEN 5-325 MG PO TABS: 1.0000 | ORAL_TABLET | Freq: Four times a day (QID) | ORAL | Status: DC | PRN

## 2013-03-22 NOTE — Patient Instructions (Signed)
The shingles rash is looking much better  Try the hydrocodone for pain - with caution, and also use a stool softener if needed to prevent constipation with this  In the next month or so if pain does not improve let me know

## 2013-03-22 NOTE — Progress Notes (Signed)
Subjective:    Patient ID: Victoria Allison, female    DOB: [birth year], 73 y.o.   MRN: [id number]  HPI Here for f/u of shingles   The rash is mostly started drying up - esp in the buttock area  Still wet in the labia area  Finished the valtrex   The pain medicine gave her side eff (tramadol)- couldn't walk straight with it   Wants to try hydrocodone again (took off her all list)- took hydrocodone apap 5-500 in the past and was fine   Patient Active Problem List   Diagnosis Date Noted  . Shingles 03/13/2013  . Pyuria 03/13/2013  . Encounter for Medicare annual wellness exam 12/10/2012  . Esophageal rings 08/24/2011  . SUPRAVENTRICULAR TACHYCARDIA 09/22/2009  . ARTHRITIS, SHOULDER 05/21/2009  . INSOMNIA 02/05/2009  . POSTMENOPAUSAL STATUS 06/02/2008  . PEMPHIGOID 11/14/2007  . Nonspecific (abnormal) findings on radiological and other examination of body structure 07/05/2007  . HYPERLIPIDEMIA 03/13/2007  . HYPERTENSION 03/13/2007  . CORONARY ARTERY DISEASE 03/13/2007  . ALLERGIC RHINITIS 03/13/2007  . GERD 03/13/2007  . HAIR LOSS 03/13/2007  . LEG PAIN, CHRONIC 03/13/2007  . URINARY INCONTINENCE 03/13/2007  . COLONIC POLYPS, HX OF 03/13/2007  . HX, URINARY INFECTION 08/14/2006   Past Medical History  Diagnosis Date  . Allergic rhinitis   . GERD (gastroesophageal reflux disease)   . CAD (coronary artery disease)   . Hyperlipemia   . Hypertension   . Urinary incontinence     OVERACTIVE BLADDER  . Lichen sclerosus 43/73    (DX BY GYN AFTER ABN PAP)  . Pemphigoid, cicatricial     D/O AFFECTING EYES, MOUTH, THROAT NOW (DX [facility])  . Elevated LFTs   . History of hepatitis     unsure of type  . Colon polyp   . Diverticulosis     sigmoid and descending colon  . Internal hemorrhoids   . DM (diabetes mellitus)     no longer take any diabetic meds now that off Prednisone  . Chronic kidney disease     overactive bladder   Past Surgical History  Procedure Laterality  Date  . Oophorectomy    . Angioplasty      LAD  . Nose surgery      Biopsy  . Cataract extraction    . Cardiac electrophysiology mapping and ablation    . Colonoscopy    . Abdominal hysterectomy    . Esophagogastroduodenoscopy  09/14/2011    Procedure: ESOPHAGOGASTRODUODENOSCOPY (EGD);  Surgeon: Gatha Mayer, MD;  Location: [facility];  Service: Endoscopy;  Laterality: N/A;  needs xray  . Savory dilation  09/14/2011    Procedure: SAVORY DILATION;  Surgeon: Gatha Mayer, MD;  Location: [facility];  Service: Endoscopy;  Laterality: N/A;  . Esophagogastroduodenoscopy N/A 01/02/2013    Procedure: ESOPHAGOGASTRODUODENOSCOPY (EGD);  Surgeon: Gatha Mayer, MD;  Location: [facility];  Service: Endoscopy;  Laterality: N/A;  Venia Minks dilation N/A 01/02/2013    Procedure: Venia Minks DILATION;  Surgeon: Gatha Mayer, MD;  Location: [facility];  Service: Endoscopy;  Laterality: N/A;  . Savory dilation N/A 01/02/2013    Procedure: SAVORY DILATION;  Surgeon: Gatha Mayer, MD;  Location: [facility];  Service: Endoscopy;  Laterality: N/A;   History  Substance Use Topics  . Smoking status: Former Smoker    Quit date: 02/29/1968  . Smokeless tobacco: Never Used  . Alcohol Use: No   Family History  Problem Relation Age of Onset  .  Stroke Mother     CVA  . Alzheimer's disease Mother   . Lung cancer Father   . Hypertension Brother   . Esophageal cancer Son 50  . Colon cancer Neg Hx   . Rectal cancer Neg Hx   . Stomach cancer Son 65   Allergies  Allergen Reactions  . Aspirin     REACTION: bleeds too easily-  . Atorvastatin     REACTION: elevated LFT's  . Tetanus Toxoid     REACTION: rash  . Tramadol     dizzy   Current Outpatient Prescriptions on File Prior to Visit  Medication Sig Dispense Refill  . acetaminophen (TYLENOL) 325 MG tablet Take 325 mg by mouth. OTC as directed       . Bimatoprost (LUMIGAN) 0.01 % SOLN One drop each eye daily       . lisinopril  (PRINIVIL,ZESTRIL) 10 MG tablet Take 1 tablet (10 mg total) by mouth daily.  90 tablet  3  . omeprazole (PRILOSEC) 40 MG capsule Take 1 capsule (40 mg total) by mouth daily.  90 capsule  3  . oxybutynin (DITROPAN XL) 15 MG 24 hr tablet Take 1 tablet (15 mg total) by mouth daily.  90 tablet  3  . pravastatin (PRAVACHOL) 40 MG tablet Take 1 tablet (40 mg total) by mouth daily.  90 tablet  3  . prednisoLONE acetate (OMNIPRED) 1 % ophthalmic suspension One drop in each eye daily       . sulfamethoxazole-trimethoprim (BACTRIM DS) 800-160 MG per tablet Take 1 tablet by mouth 2 (two) times daily.  14 tablet  0  . traMADol (ULTRAM) 50 MG tablet Take 1 tablet (50 mg total) by mouth every 8 (eight) hours as needed for moderate pain (use with caution for sedation).  30 tablet  1  . valACYclovir (VALTREX) 1000 MG tablet Take 1 tablet (1,000 mg total) by mouth 3 (three) times daily.  21 tablet  0  . [DISCONTINUED] oxybutynin (DITROPAN XL) 15 MG 24 hr tablet Take 15 mg by mouth daily.         No current facility-administered medications on file prior to visit.     Review of Systems Review of Systems  Constitutional: Negative for fever, appetite change, fatigue and unexpected weight change.  Eyes: Negative for pain and visual disturbance.  Respiratory: Negative for cough and shortness of breath.   Cardiovascular: Negative for cp or palpitations    Gastrointestinal: Negative for nausea, diarrhea and constipation.  Genitourinary: Negative for urgency and frequency. neg for dysuria  Skin: Negative for pallor and pos for shingles rash which is painful   Neurological: Negative for weakness, light-headedness, numbness and headaches.  Hematological: Negative for adenopathy. Does not bruise/bleed easily.  Psychiatric/Behavioral: Negative for dysphoric mood. The patient is not nervous/anxious.         Objective:   Physical Exam  Constitutional: She appears well-developed and well-nourished.  HENT:  Head:  Normocephalic and atraumatic.  Eyes: Conjunctivae and EOM are normal. Pupils are equal, round, and reactive to light.  Cardiovascular: Normal rate and regular rhythm.   Abdominal: Soft. Bowel sounds are normal. She exhibits no distension. There is no tenderness.  Neurological: She is alert.  Skin: Skin is warm and dry. Rash noted.  Zoster rash in S2 dermatome is much improved- all vesicles are dry and no oozing  Labia is no longer swollen Area is still sensitive and tender  Psychiatric: She has a normal mood and affect.  Assessment & Plan:

## 2013-03-22 NOTE — Assessment & Plan Note (Signed)
Improved rash Intol of tramadol - will change to norco 5-325 for pain   (pt is tolerant of this)- with warnings of sedation and also constipation  inst to keep area clean and dry  Update in the next several weeks re: how pain is

## 2013-03-22 NOTE — Progress Notes (Signed)
Pre-visit discussion using our clinic review tool. No additional management support is needed unless otherwise documented below in the visit note.  

## 2013-04-05 ENCOUNTER — Ambulatory Visit (INDEPENDENT_AMBULATORY_CARE_PROVIDER_SITE_OTHER): Payer: Medicare Other | Admitting: Internal Medicine

## 2013-04-05 ENCOUNTER — Encounter: Payer: Self-pay | Admitting: Internal Medicine

## 2013-04-05 VITALS — BP 146/68 | HR 76 | Ht 60.25 in | Wt 111.5 lb

## 2013-04-05 DIAGNOSIS — K222 Esophageal obstruction: Secondary | ICD-10-CM | POA: Diagnosis not present

## 2013-04-05 DIAGNOSIS — R1310 Dysphagia, unspecified: Secondary | ICD-10-CM

## 2013-04-05 DIAGNOSIS — L129 Pemphigoid, unspecified: Secondary | ICD-10-CM | POA: Diagnosis not present

## 2013-04-05 NOTE — Progress Notes (Signed)
Subjective:    Patient ID: Victoria Allison, female    DOB: Jun 01, 1940, 73 y.o.   MRN: [id number]  HPI Very nice elderly lady with pemphigoid and esophageal rings - she was dilated 08/2011 and then again 12/2012. She started to have recurrent pill and solid food dysphagia in early January and is having this several times a week now. Denies heartburn. Mastication has become more difficult despite new dentures - pemphigoid has resulted in atrophy of the gums - despite posts/implants she says they are loose and problematic.  Wt Readings from Last 3 Encounters:  04/05/13 111 lb 8 oz (50.576 kg)  03/22/13 112 lb (50.803 kg)  03/13/13 114 lb 8 oz (51.937 kg)   Allergies  Allergen Reactions  . Aspirin     REACTION: bleeds too easily-  . Lipitor [Atorvastatin]     REACTION: elevated LFT's  . Tetanus Toxoid     REACTION: rash  . Ultram [Tramadol]     dizzy   Outpatient Prescriptions Prior to Visit  Medication Sig Dispense Refill  . acetaminophen (TYLENOL) 325 MG tablet Take 325 mg by mouth. OTC as directed       . Bimatoprost (LUMIGAN) 0.01 % SOLN One drop each eye daily       . HYDROcodone-acetaminophen (NORCO) 5-325 MG per tablet Take 1 tablet by mouth every 6 (six) hours as needed for moderate pain (watch out for sedation and constipation).  90 tablet  0  . lisinopril (PRINIVIL,ZESTRIL) 10 MG tablet Take 1 tablet (10 mg total) by mouth daily.  90 tablet  3  . omeprazole (PRILOSEC) 40 MG capsule Take 1 capsule (40 mg total) by mouth daily.  90 capsule  3  . oxybutynin (DITROPAN XL) 15 MG 24 hr tablet Take 1 tablet (15 mg total) by mouth daily.  90 tablet  3  . pravastatin (PRAVACHOL) 40 MG tablet Take 1 tablet (40 mg total) by mouth daily.  90 tablet  3  . prednisoLONE acetate (OMNIPRED) 1 % ophthalmic suspension One drop in each eye daily       . sulfamethoxazole-trimethoprim (BACTRIM DS) 800-160 MG per tablet Take 1 tablet by mouth 2 (two) times daily.  14 tablet  0  . traMADol  (ULTRAM) 50 MG tablet Take 1 tablet (50 mg total) by mouth every 8 (eight) hours as needed for moderate pain (use with caution for sedation).  30 tablet  1  . valACYclovir (VALTREX) 1000 MG tablet Take 1 tablet (1,000 mg total) by mouth 3 (three) times daily.  21 tablet  0   No facility-administered medications prior to visit.   Past Medical History  Diagnosis Date  . Allergic rhinitis   . GERD (gastroesophageal reflux disease)   . CAD (coronary artery disease)   . Hyperlipemia   . Hypertension   . Urinary incontinence     OVERACTIVE BLADDER  . Lichen sclerosus 40/98    (DX BY GYN AFTER ABN PAP)  . Pemphigoid, cicatricial     D/O AFFECTING EYES, MOUTH, THROAT NOW (DX [facility])  . Elevated LFTs   . History of hepatitis     unsure of type  . Colon polyp   . Diverticulosis     sigmoid and descending colon  . Internal hemorrhoids   . DM (diabetes mellitus)     no longer take any diabetic meds now that off Prednisone  . Chronic kidney disease     overactive bladder  . Esophageal  ring    Past Surgical History  Procedure Laterality Date  . Oophorectomy    . Angioplasty      LAD  . Nose surgery      Biopsy  . Cataract extraction    . Cardiac electrophysiology mapping and ablation    . Colonoscopy    . Abdominal hysterectomy    . Esophagogastroduodenoscopy  09/14/2011    Procedure: ESOPHAGOGASTRODUODENOSCOPY (EGD);  Surgeon: Gatha Mayer, MD;  Location: [facility];  Service: Endoscopy;  Laterality: N/A;  needs xray  . Savory dilation  09/14/2011    Procedure: SAVORY DILATION;  Surgeon: Gatha Mayer, MD;  Location: [facility];  Service: Endoscopy;  Laterality: N/A;  . Esophagogastroduodenoscopy N/A 01/02/2013    Procedure: ESOPHAGOGASTRODUODENOSCOPY (EGD);  Surgeon: Gatha Mayer, MD;  Location: [facility];  Service: Endoscopy;  Laterality: N/A;  Venia Minks dilation N/A 01/02/2013    Procedure: Venia Minks DILATION;  Surgeon: Gatha Mayer, MD;  Location: [facility];  Service:  Endoscopy;  Laterality: N/A;  . Savory dilation N/A 01/02/2013    Procedure: SAVORY DILATION;  Surgeon: Gatha Mayer, MD;  Location: [facility];  Service: Endoscopy;  Laterality: N/A;   History   Social History  . Marital Status: Married    Spouse Name: N/A    Number of Children: 2  . Years of Education: N/A   Occupational History  . Retired    Social History Main Topics  . Smoking status: Former Smoker    Quit date: 02/29/1968  . Smokeless tobacco: Never Used  . Alcohol Use: No  . Drug Use: No  . Sexual Activity: None   Other Topics Concern  . None   Social History Narrative   Husband is fighting cancer. Exercises on stationary bike/walks   Daily caffeine    Family History  Problem Relation Age of Onset  . Stroke Mother     CVA  . Alzheimer's disease Mother   . Lung cancer Father   . Hypertension Brother   . Esophageal cancer Son 81  . Colon cancer Neg Hx   . Rectal cancer Neg Hx   . Stomach cancer Son 59   Review of Systems Recent shingles, right buttock    Objective:   Physical Exam General:  NAD Eyes:   Anicteric Pharynx: Dentures Lungs:  clear Heart:  S1S2 no rubs, murmurs or gallops Neuro: A and O x 3  Data Reviewed:   Previous EGD's    Assessment & Plan:  Esophageal rings - Plan: 0.9 %  sodium chloride infusion, Ambulatory referral to Gastroenterology  Pemphigoid - Plan: Ambulatory referral to Gastroenterology  Dysphagia - Plan: Ambulatory referral to Gastroenterology

## 2013-04-05 NOTE — Assessment & Plan Note (Addendum)
Recurrent dysphagia - will schedule repeat dilation using fluoroscopy to guide. Presumably it is due to the rings, ? Impact of mastication problems.  The risks and benefits as well as alternatives of endoscopic procedure(s) have been discussed and reviewed. All questions answered. The patient agrees to proceed.

## 2013-04-05 NOTE — Patient Instructions (Signed)
You have been scheduled for an endoscopy at [facility]. Please follow written instructions given to you at your visit today. If you use inhalers (even only as needed), please bring them with you on the day of your procedure.  I appreciate the opportunity to care for you.  

## 2013-04-05 NOTE — Assessment & Plan Note (Addendum)
Underlying issue causing dysphagia, I think. Will attempt dilation again.

## 2013-04-12 ENCOUNTER — Ambulatory Visit (HOSPITAL_COMMUNITY): Payer: Medicare Other

## 2013-04-12 ENCOUNTER — Encounter (HOSPITAL_COMMUNITY): Payer: Self-pay | Admitting: *Deleted

## 2013-04-12 ENCOUNTER — Ambulatory Visit (HOSPITAL_COMMUNITY)
Admission: RE | Admit: 2013-04-12 | Discharge: 2013-04-12 | Disposition: A | Discharge status: Home / Self Care | Payer: Medicare Other | Source: Ambulatory Visit | Attending: Internal Medicine | Admitting: Internal Medicine

## 2013-04-12 ENCOUNTER — Encounter (HOSPITAL_COMMUNITY)
Admission: RE | Disposition: A | Discharge status: Home / Self Care | Payer: Self-pay | Source: Ambulatory Visit | Attending: Internal Medicine

## 2013-04-12 DIAGNOSIS — K219 Gastro-esophageal reflux disease without esophagitis: Secondary | ICD-10-CM | POA: Insufficient documentation

## 2013-04-12 DIAGNOSIS — I2510 Atherosclerotic heart disease of native coronary artery without angina pectoris: Secondary | ICD-10-CM | POA: Diagnosis not present

## 2013-04-12 DIAGNOSIS — R1314 Dysphagia, pharyngoesophageal phase: Secondary | ICD-10-CM

## 2013-04-12 DIAGNOSIS — I10 Essential (primary) hypertension: Secondary | ICD-10-CM | POA: Diagnosis not present

## 2013-04-12 DIAGNOSIS — IMO0002 Reserved for concepts with insufficient information to code with codable children: Secondary | ICD-10-CM | POA: Insufficient documentation

## 2013-04-12 DIAGNOSIS — L129 Pemphigoid, unspecified: Secondary | ICD-10-CM | POA: Diagnosis not present

## 2013-04-12 DIAGNOSIS — E785 Hyperlipidemia, unspecified: Secondary | ICD-10-CM | POA: Diagnosis not present

## 2013-04-12 DIAGNOSIS — Z87891 Personal history of nicotine dependence: Secondary | ICD-10-CM | POA: Insufficient documentation

## 2013-04-12 DIAGNOSIS — R1319 Other dysphagia: Secondary | ICD-10-CM

## 2013-04-12 DIAGNOSIS — Z79899 Other long term (current) drug therapy: Secondary | ICD-10-CM | POA: Insufficient documentation

## 2013-04-12 DIAGNOSIS — L940 Localized scleroderma [morphea]: Secondary | ICD-10-CM | POA: Diagnosis not present

## 2013-04-12 DIAGNOSIS — K222 Esophageal obstruction: Secondary | ICD-10-CM

## 2013-04-12 DIAGNOSIS — R1310 Dysphagia, unspecified: Secondary | ICD-10-CM | POA: Diagnosis present

## 2013-04-12 DIAGNOSIS — K209 Esophagitis, unspecified without bleeding: Secondary | ICD-10-CM | POA: Diagnosis not present

## 2013-04-12 HISTORY — PX: SAVORY DILATION: SHX5439

## 2013-04-12 HISTORY — DX: Zoster without complications: B02.9

## 2013-04-12 HISTORY — PX: ESOPHAGOGASTRODUODENOSCOPY: SHX5428

## 2013-04-12 SURGERY — EGD (ESOPHAGOGASTRODUODENOSCOPY)
Anesthesia: Moderate Sedation

## 2013-04-12 MED ORDER — BUTAMBEN-TETRACAINE-BENZOCAINE 2-2-14 % EX AERO
INHALATION_SPRAY | CUTANEOUS | Status: DC | PRN
  Administered 2013-04-12: 2 via TOPICAL

## 2013-04-12 MED ORDER — FENTANYL CITRATE 0.05 MG/ML IJ SOLN
INTRAMUSCULAR | Status: AC
  Filled 2013-04-12: qty 2

## 2013-04-12 MED ORDER — SODIUM CHLORIDE 0.9 % IV SOLN
INTRAVENOUS | Status: DC
  Administered 2013-04-12: 500 mL via INTRAVENOUS

## 2013-04-12 MED ORDER — FENTANYL CITRATE 0.05 MG/ML IJ SOLN
INTRAMUSCULAR | Status: DC | PRN
  Administered 2013-04-12 (×2): 25 ug via INTRAVENOUS

## 2013-04-12 MED ORDER — MIDAZOLAM HCL 10 MG/2ML IJ SOLN
INTRAMUSCULAR | Status: DC | PRN
  Administered 2013-04-12 (×3): 2 mg via INTRAVENOUS

## 2013-04-12 MED ORDER — MIDAZOLAM HCL 10 MG/2ML IJ SOLN
INTRAMUSCULAR | Status: AC
  Filled 2013-04-12: qty 2

## 2013-04-12 NOTE — Op Note (Signed)
[facility] [place], [id number]   ENDOSCOPY PROCEDURE REPORT  PATIENT: Victoria, Allison  MR#: [id number] BIRTHDATE: [birth year] , 27  yrs. old GENDER: Female ENDOSCOPIST: Gatha Mayer, MD, [facility] PROCEDURE DATE:  04/12/2013 PROCEDURE:  Esophagoscopy with biopsy and Maloney dilation of esophagus ASA CLASS:     Class II INDICATIONS:  Dysphagia. MEDICATIONS: Fentanyl 50 mcg IV and Versed 6 mg IV TOPICAL ANESTHETIC: Cetacaine Spray  DESCRIPTION OF PROCEDURE: After the risks benefits and alternatives of the procedure were thoroughly explained, informed consent was obtained.  The Pentax Gastroscope [id number] endoscope was introduced through the mouth and advanced to the stomach antrum. Without limitations.  The instrument was slowly withdrawn as the mucosa was fully examined.        ESOPHAGUS: Multiple subtle rings were seen in the mid and diatal esophagus.  Biopsies of entire esophagus taken.  STOMACH: Normal cardia, fundus, body and antrum.  Retroflexed views revealed no abnormalities.     The scope was then withdrawn from the patient. 50 and 52 [place] Maloney dilators wer passed with mild resistance and slight heme after biopsies  using fluoroscopic guidance with re-inspection showing a dilation effect,  and the procedure completed.  COMPLICATIONS: There were no complications. ENDOSCOPIC IMPRESSION: 1.   Multiple subtle rings were seen in the mid and diatal esophagus.  Biopsies of entire esophagus taken. 2.   Normal cardia, fundus, body and antrum.  RECOMMENDATIONS: 1.  Clear liquids until 130 , then soft foods rest of day.  Resume prior diet tomorrow. 2.  Office will call with results    eSigned:  Gatha Mayer, MD, [facility] 04/12/2013 11:33 AM   CC:The Patient

## 2013-04-12 NOTE — Interval H&P Note (Signed)
History and Physical Interval Note:  04/12/2013 10:35 AM  Victoria Allison  has presented today for surgery, with the diagnosis of esophageal ring dysphagia  The various methods of treatment have been discussed with the patient and family. After consideration of risks, benefits and other options for treatment, the patient has consented to  Procedure(s): ESOPHAGOGASTRODUODENOSCOPY (EGD) (N/A) SAVORY DILATION (N/A) as a surgical intervention .  The patient's history has been reviewed, patient examined, no change in status, stable for surgery.  I have reviewed the patient's chart and labs.  Questions were answered to the patient's satisfaction.     Silvano Rusk, MD, [facility]

## 2013-04-12 NOTE — H&P (View-Only) (Signed)
       Subjective:    Patient ID: Victoria Allison, female    DOB: 02/18/1941, 73 y.o.   MRN: [id number]  HPI Very nice elderly lady with pemphigoid and esophageal rings - she was dilated 08/2011 and then again 12/2012. She started to have recurrent pill and solid food dysphagia in early January and is having this several times a week now. Denies heartburn. Mastication has become more difficult despite new dentures - pemphigoid has resulted in atrophy of the gums - despite posts/implants she says they are loose and problematic.  Wt Readings from Last 3 Encounters:  04/05/13 111 lb 8 oz (50.576 kg)  03/22/13 112 lb (50.803 kg)  03/13/13 114 lb 8 oz (51.937 kg)   Allergies  Allergen Reactions  . Aspirin     REACTION: bleeds too easily-  . Lipitor [Atorvastatin]     REACTION: elevated LFT's  . Tetanus Toxoid     REACTION: rash  . Ultram [Tramadol]     dizzy   Outpatient Prescriptions Prior to Visit  Medication Sig Dispense Refill  . acetaminophen (TYLENOL) 325 MG tablet Take 325 mg by mouth. OTC as directed       . Bimatoprost (LUMIGAN) 0.01 % SOLN One drop each eye daily       . HYDROcodone-acetaminophen (NORCO) 5-325 MG per tablet Take 1 tablet by mouth every 6 (six) hours as needed for moderate pain (watch out for sedation and constipation).  90 tablet  0  . lisinopril (PRINIVIL,ZESTRIL) 10 MG tablet Take 1 tablet (10 mg total) by mouth daily.  90 tablet  3  . omeprazole (PRILOSEC) 40 MG capsule Take 1 capsule (40 mg total) by mouth daily.  90 capsule  3  . oxybutynin (DITROPAN XL) 15 MG 24 hr tablet Take 1 tablet (15 mg total) by mouth daily.  90 tablet  3  . pravastatin (PRAVACHOL) 40 MG tablet Take 1 tablet (40 mg total) by mouth daily.  90 tablet  3  . prednisoLONE acetate (OMNIPRED) 1 % ophthalmic suspension One drop in each eye daily       . sulfamethoxazole-trimethoprim (BACTRIM DS) 800-160 MG per tablet Take 1 tablet by mouth 2 (two) times daily.  14 tablet  0  . traMADol  (ULTRAM) 50 MG tablet Take 1 tablet (50 mg total) by mouth every 8 (eight) hours as needed for moderate pain (use with caution for sedation).  30 tablet  1  . valACYclovir (VALTREX) 1000 MG tablet Take 1 tablet (1,000 mg total) by mouth 3 (three) times daily.  21 tablet  0   No facility-administered medications prior to visit.   Past Medical History  Diagnosis Date  . Allergic rhinitis   . GERD (gastroesophageal reflux disease)   . CAD (coronary artery disease)   . Hyperlipemia   . Hypertension   . Urinary incontinence     OVERACTIVE BLADDER  . Lichen sclerosus 05/09    (DX BY GYN AFTER ABN PAP)  . Pemphigoid, cicatricial     D/O AFFECTING EYES, MOUTH, THROAT NOW (DX [facility])  . Elevated LFTs   . History of hepatitis     unsure of type  . Colon polyp   . Diverticulosis     sigmoid and descending colon  . Internal hemorrhoids   . DM (diabetes mellitus)     no longer take any diabetic meds now that off Prednisone  . Chronic kidney disease     overactive bladder  . Esophageal   ring    Past Surgical History  Procedure Laterality Date  . Oophorectomy    . Angioplasty      LAD  . Nose surgery      Biopsy  . Cataract extraction    . Cardiac electrophysiology mapping and ablation    . Colonoscopy    . Abdominal hysterectomy    . Esophagogastroduodenoscopy  09/14/2011    Procedure: ESOPHAGOGASTRODUODENOSCOPY (EGD);  Surgeon: Gatha Mayer, MD;  Location: [facility];  Service: Endoscopy;  Laterality: N/A;  needs xray  . Savory dilation  09/14/2011    Procedure: SAVORY DILATION;  Surgeon: Gatha Mayer, MD;  Location: [facility];  Service: Endoscopy;  Laterality: N/A;  . Esophagogastroduodenoscopy N/A 01/02/2013    Procedure: ESOPHAGOGASTRODUODENOSCOPY (EGD);  Surgeon: Gatha Mayer, MD;  Location: [facility];  Service: Endoscopy;  Laterality: N/A;  Venia Minks dilation N/A 01/02/2013    Procedure: Venia Minks DILATION;  Surgeon: Gatha Mayer, MD;  Location: [facility];  Service:  Endoscopy;  Laterality: N/A;  . Savory dilation N/A 01/02/2013    Procedure: SAVORY DILATION;  Surgeon: Gatha Mayer, MD;  Location: [facility];  Service: Endoscopy;  Laterality: N/A;   History   Social History  . Marital Status: Married    Spouse Name: N/A    Number of Children: 2  . Years of Education: N/A   Occupational History  . Retired    Social History Main Topics  . Smoking status: Former Smoker    Quit date: 02/29/1968  . Smokeless tobacco: Never Used  . Alcohol Use: No  . Drug Use: No  . Sexual Activity: None   Other Topics Concern  . None   Social History Narrative   Husband is fighting cancer. Exercises on stationary bike/walks   Daily caffeine    Family History  Problem Relation Age of Onset  . Stroke Mother     CVA  . Alzheimer's disease Mother   . Lung cancer Father   . Hypertension Brother   . Esophageal cancer Son 81  . Colon cancer Neg Hx   . Rectal cancer Neg Hx   . Stomach cancer Son 59   Review of Systems Recent shingles, right buttock    Objective:   Physical Exam General:  NAD Eyes:   Anicteric Pharynx: Dentures Lungs:  clear Heart:  S1S2 no rubs, murmurs or gallops Neuro: A and O x 3  Data Reviewed:   Previous EGD's    Assessment & Plan:  Esophageal rings - Plan: 0.9 %  sodium chloride infusion, Ambulatory referral to Gastroenterology  Pemphigoid - Plan: Ambulatory referral to Gastroenterology  Dysphagia - Plan: Ambulatory referral to Gastroenterology

## 2013-04-12 NOTE — Discharge Instructions (Signed)
I dilated the esophagus more today and I also took some biopsies (have never done that). Things look about the same. My office will call you with biopsy results and to get an update on your condition and we will go from there.  I appreciate the opportunity to care for you. Gatha Mayer, MD, FACG  YOU HAD AN ENDOSCOPIC PROCEDURE TODAY: Refer to the procedure report and other information in the discharge instructions given to you for any specific questions about what was found during the examination. If this information does not answer your questions, please call Dr. Celesta Aver office at [phone] to clarify.   YOU SHOULD EXPECT: Some feelings of bloating in the abdomen. Passage of more gas than usual. Walking can help get rid of the air that was put into your GI tract during the procedure and reduce the bloating. If you had a lower endoscopy (such as a colonoscopy or flexible sigmoidoscopy) you may notice spotting of blood in your stool or on the toilet paper. Some abdominal soreness may be present for a day or two, also.  DIET: Start with clear liquids and stay on those until 130 PM. If ok then may have soft foods like noodles, soup, mashed potatoes. Drink plenty of fluids but you should avoid alcoholic beverages for 24 hours. You may try regular foods tomorrow if you feel ok.  ACTIVITY: Your care partner should take you home directly after the procedure. You should plan to take it easy, moving slowly for the rest of the day. You can resume normal activity the day after the procedure however YOU SHOULD NOT DRIVE, use power tools, machinery or perform tasks that involve climbing or major physical exertion for 24 hours (because of the sedation medicines used during the test).   SYMPTOMS TO REPORT IMMEDIATELY: A gastroenterologist can be reached at any hour. Please call [phone]  for any of the following symptoms:  Following upper endoscopy (EGD, EUS, ERCP, esophageal dilation) Vomiting of  blood or coffee ground material  New, significant abdominal pain  New, significant chest pain or pain under the shoulder blades  Painful or persistently difficult swallowing  New shortness of breath  Black, tarry-looking or red, bloody stools  FOLLOW UP:  If any biopsies were taken you will be contacted by phone or by letter within the next 1-3 weeks. Call [phone]  if you have not heard about the biopsies in 3 weeks.  Please also call with any specific questions about appointments or follow up tests.

## 2013-04-15 ENCOUNTER — Encounter (HOSPITAL_COMMUNITY): Payer: Self-pay | Admitting: Internal Medicine

## 2013-04-16 NOTE — Progress Notes (Signed)
Quick Note:  Biopsies show slight inflammation - nothing specific How is her swallowing after dilation? ______

## 2013-04-17 NOTE — Progress Notes (Signed)
Quick Note:  OK then f/u prn ______

## 2013-04-30 ENCOUNTER — Other Ambulatory Visit: Payer: Self-pay

## 2013-04-30 MED ORDER — HYDROCODONE-ACETAMINOPHEN 5-325 MG PO TABS: 1.0000 | ORAL_TABLET | Freq: Four times a day (QID) | ORAL | Status: DC | PRN

## 2013-04-30 NOTE — Telephone Encounter (Signed)
Pt left v/m requesting rx hydrocodone apap. Call when ready for pick up.  

## 2013-04-30 NOTE — Telephone Encounter (Signed)
Px printed for pick up in IN box  

## 2013-05-01 NOTE — Telephone Encounter (Signed)
Pt notified Rx ready for pickup 

## 2013-06-19 DIAGNOSIS — R339 Retention of urine, unspecified: Secondary | ICD-10-CM | POA: Diagnosis not present

## 2013-06-19 DIAGNOSIS — N3946 Mixed incontinence: Secondary | ICD-10-CM | POA: Diagnosis not present

## 2013-06-19 DIAGNOSIS — H40009 Preglaucoma, unspecified, unspecified eye: Secondary | ICD-10-CM | POA: Diagnosis not present

## 2013-06-19 DIAGNOSIS — N3020 Other chronic cystitis without hematuria: Secondary | ICD-10-CM | POA: Diagnosis not present

## 2013-08-02 DIAGNOSIS — L129 Pemphigoid, unspecified: Secondary | ICD-10-CM | POA: Diagnosis not present

## 2013-08-02 DIAGNOSIS — D045 Carcinoma in situ of skin of trunk: Secondary | ICD-10-CM | POA: Diagnosis not present

## 2013-08-02 DIAGNOSIS — L570 Actinic keratosis: Secondary | ICD-10-CM | POA: Diagnosis not present

## 2013-08-02 DIAGNOSIS — Z85828 Personal history of other malignant neoplasm of skin: Secondary | ICD-10-CM | POA: Diagnosis not present

## 2013-08-02 DIAGNOSIS — D485 Neoplasm of uncertain behavior of skin: Secondary | ICD-10-CM | POA: Diagnosis not present

## 2013-08-02 DIAGNOSIS — D044 Carcinoma in situ of skin of scalp and neck: Secondary | ICD-10-CM | POA: Diagnosis not present

## 2013-11-14 DIAGNOSIS — L121 Cicatricial pemphigoid: Secondary | ICD-10-CM | POA: Diagnosis not present

## 2013-11-14 DIAGNOSIS — L570 Actinic keratosis: Secondary | ICD-10-CM | POA: Diagnosis not present

## 2013-11-14 DIAGNOSIS — Z85828 Personal history of other malignant neoplasm of skin: Secondary | ICD-10-CM | POA: Diagnosis not present

## 2013-12-02 ENCOUNTER — Telehealth: Payer: Self-pay | Admitting: Family Medicine

## 2013-12-02 DIAGNOSIS — I10 Essential (primary) hypertension: Secondary | ICD-10-CM

## 2013-12-02 DIAGNOSIS — E785 Hyperlipidemia, unspecified: Secondary | ICD-10-CM

## 2013-12-02 NOTE — Telephone Encounter (Signed)
Message copied by Abner Greenspan on Mon Dec 02, 2013  9:20 PM ------      Message from: Ellamae Sia      Created: Thu Nov 28, 2013  4:36 PM      Regarding: Lab orders for Thursday,10.8.15       Patient is scheduled for CPX labs, please order future labs, Thanks , Terri       ------

## 2013-12-06 ENCOUNTER — Other Ambulatory Visit (INDEPENDENT_AMBULATORY_CARE_PROVIDER_SITE_OTHER): Payer: Medicare Other

## 2013-12-06 DIAGNOSIS — E785 Hyperlipidemia, unspecified: Secondary | ICD-10-CM | POA: Diagnosis not present

## 2013-12-06 DIAGNOSIS — I10 Essential (primary) hypertension: Secondary | ICD-10-CM | POA: Diagnosis not present

## 2013-12-06 LAB — COMPREHENSIVE METABOLIC PANEL
ALT: 21 U/L (ref 0–35)
AST: 22 U/L (ref 0–37)
Albumin: 3.4 g/dL — ABNORMAL LOW (ref 3.5–5.2)
Alkaline Phosphatase: 82 U/L (ref 39–117)
BUN: 10 mg/dL (ref 6–23)
CO2: 25 mEq/L (ref 19–32)
Calcium: 9.4 mg/dL (ref 8.4–10.5)
Chloride: 104 mEq/L (ref 96–112)
Creatinine, Ser: 0.7 mg/dL (ref 0.4–1.2)
GFR: 83.85 mL/min (ref >60.00)
Glucose, Bld: 91 mg/dL (ref 70–99)
Potassium: 4.4 mEq/L (ref 3.5–5.1)
Sodium: 135 mEq/L (ref 135–145)
Total Bilirubin: 0.4 mg/dL (ref 0.2–1.2)
Total Protein: 7.2 g/dL (ref 6.0–8.3)

## 2013-12-06 LAB — LIPID PANEL
Cholesterol: 191 mg/dL (ref 0–200)
HDL: 45.2 mg/dL (ref >39.00)
LDL Cholesterol: 123 mg/dL — ABNORMAL HIGH (ref 0–99)
NonHDL: 145.8
Total CHOL/HDL Ratio: 4
Triglycerides: 115 mg/dL (ref 0.0–149.0)
VLDL: 23 mg/dL (ref 0.0–40.0)

## 2013-12-06 LAB — CBC WITH DIFFERENTIAL/PLATELET
Basophils Absolute: 0 10*3/uL (ref 0.0–0.1)
Basophils Relative: 0.8 % (ref 0.0–3.0)
Eosinophils Absolute: 0.2 10*3/uL (ref 0.0–0.7)
Eosinophils Relative: 3.1 % (ref 0.0–5.0)
HCT: 39.4 % (ref 36.0–46.0)
Hemoglobin: 13 g/dL (ref 12.0–15.0)
Lymphocytes Relative: 35.3 % (ref 12.0–46.0)
Lymphs Abs: 1.9 10*3/uL (ref 0.7–4.0)
MCHC: 33.1 g/dL (ref 30.0–36.0)
MCV: 89.7 fl (ref 78.0–100.0)
Monocytes Absolute: 0.4 10*3/uL (ref 0.1–1.0)
Monocytes Relative: 6.5 % (ref 3.0–12.0)
Neutro Abs: 3 10*3/uL (ref 1.4–7.7)
Neutrophils Relative %: 54.3 % (ref 43.0–77.0)
Platelets: 180 10*3/uL (ref 150.0–400.0)
RBC: 4.39 Mil/uL (ref 3.87–5.11)
RDW: 13.5 % (ref 11.5–15.5)
WBC: 5.5 10*3/uL (ref 4.0–10.5)

## 2013-12-06 LAB — TSH: TSH: 2.92 u[IU]/mL (ref 0.35–4.50)

## 2013-12-13 ENCOUNTER — Encounter: Payer: Self-pay | Admitting: Family Medicine

## 2013-12-13 ENCOUNTER — Ambulatory Visit (INDEPENDENT_AMBULATORY_CARE_PROVIDER_SITE_OTHER): Payer: Medicare Other | Admitting: Family Medicine

## 2013-12-13 VITALS — BP 130/80 | HR 73 | Temp 98.5°F | Ht 60.25 in | Wt 114.0 lb

## 2013-12-13 DIAGNOSIS — I10 Essential (primary) hypertension: Secondary | ICD-10-CM | POA: Diagnosis not present

## 2013-12-13 DIAGNOSIS — Z1211 Encounter for screening for malignant neoplasm of colon: Secondary | ICD-10-CM | POA: Diagnosis not present

## 2013-12-13 DIAGNOSIS — Z0000 Encounter for general adult medical examination without abnormal findings: Secondary | ICD-10-CM | POA: Diagnosis not present

## 2013-12-13 DIAGNOSIS — Z23 Encounter for immunization: Secondary | ICD-10-CM

## 2013-12-13 DIAGNOSIS — E785 Hyperlipidemia, unspecified: Secondary | ICD-10-CM | POA: Diagnosis not present

## 2013-12-13 MED ORDER — PRAVASTATIN SODIUM 40 MG PO TABS: 40.0000 mg | ORAL_TABLET | Freq: Every day | ORAL | Status: DC

## 2013-12-13 MED ORDER — LISINOPRIL 10 MG PO TABS: 10.0000 mg | ORAL_TABLET | Freq: Every day | ORAL | Status: DC

## 2013-12-13 MED ORDER — OXYBUTYNIN CHLORIDE ER 15 MG PO TB24: 15.0000 mg | ORAL_TABLET | Freq: Every day | ORAL | Status: DC

## 2013-12-13 NOTE — Patient Instructions (Addendum)
prevnar vaccine and flu vaccine today  Please do IFOB card for colon cancer screening  Take care of yourself  Stay active and eat a healthy well balanced diet

## 2013-12-13 NOTE — Assessment & Plan Note (Signed)
Reviewed health habits including diet and exercise and skin cancer prevention Reviewed appropriate screening tests for age  Also reviewed health mt list, fam hx and immunization status , as well as social and family history   See HPI Labs reviewed Flu shot and prevnar vaccines today

## 2013-12-13 NOTE — Progress Notes (Signed)
Pre visit review using our clinic review tool, if applicable. No additional management support is needed unless otherwise documented below in the visit note. 

## 2013-12-13 NOTE — Assessment & Plan Note (Signed)
Pt had polyps with last colonosc Will call GI to see when she is due for next one (? Due to age 73 skip) Given IFOB card today

## 2013-12-13 NOTE — Progress Notes (Signed)
Subjective:    Patient ID: Victoria Allison, female    DOB: 02-11-1941, 73 y.o.   MRN: [id number]  HPI I have personally reviewed the Medicare Annual Wellness questionnaire and have noted 1. The patient's medical and social history 2. Their use of alcohol, tobacco or illicit drugs 3. Their current medications and supplements 4. The patient's functional ability including ADL's, fall risks, home safety risks and hearing or visual             impairment. 5. Diet and physical activities 6. Evidence for depression or mood disorders  The patients weight, height, BMI have been recorded in the chart and visual acuity is per eye clinic.  I have made referrals, counseling and provided education to the patient based review of the above and I have provided the pt with a written personalized care plan for preventive services.  She is feeling better overall and able to gain some weight   See scanned forms.  Routine anticipatory guidance given to patient.  See health maintenance. Colon cancer screening 6/10 - polyps (she was not aware of a f/u) Breast cancer screening-she still declines mammograms  Self breast exam-no lumps or changes  Flu vaccine- today  Tetanus vaccine 4/10   Pneumovax 10/10 , prevnar  Zoster vaccine -not interested in a shingles vaccine   Advance directive-has that drawn up  Cognitive function addressed- see scanned forms- and if abnormal then additional documentation follows. No problems , does fairly well   PMH and SH reviewed  Meds, vitals, and allergies reviewed.   ROS: See HPI.  Otherwise negative.     bp is stable today  No cp or palpitations or headaches or edema  No side effects to medicines  BP Readings from Last 3 Encounters:  12/13/13 130/80  04/12/13 138/78  04/12/13 138/78      Hyperlipidemia  Lab Results  Component Value Date   CHOL 191 12/06/2013   CHOL 180 12/06/2012   CHOL 163 12/01/2011   Lab Results  Component Value Date   HDL 45.20  12/06/2013   HDL 47.00 12/06/2012   HDL 42.80 12/01/2011   Lab Results  Component Value Date   LDLCALC 123* 12/06/2013   LDLCALC 102* 12/06/2012   LDLCALC 99 12/01/2011   Lab Results  Component Value Date   TRIG 115.0 12/06/2013   TRIG 157.0* 12/06/2012   TRIG 106.0 12/01/2011   Lab Results  Component Value Date   CHOLHDL 4 12/06/2013   CHOLHDL 4 12/06/2012   CHOLHDL 4 12/01/2011   Lab Results  Component Value Date   LDLDIRECT 184.0 11/25/2010   LDLDIRECT 86.7 02/04/2009   on pravastatin and diet  Is eating a little more than she was  No problems with pravastatin   Her pemphigoid is stable recently   Other labs are stable    Patient Active Problem List   Diagnosis Date Noted  . Colon cancer screening 12/13/2013  . Esophageal dysphagia 04/12/2013  . Shingles 03/13/2013  . Encounter for Medicare annual wellness exam 12/10/2012  . Esophageal rings 08/24/2011  . SUPRAVENTRICULAR TACHYCARDIA 09/22/2009  . ARTHRITIS, SHOULDER 05/21/2009  . INSOMNIA 02/05/2009  . POSTMENOPAUSAL STATUS 06/02/2008  . PEMPHIGOID 11/14/2007  . Nonspecific (abnormal) findings on radiological and other examination of body structure 07/05/2007  . Hyperlipidemia LDL goal <100 03/13/2007  . Essential hypertension 03/13/2007  . CORONARY ARTERY DISEASE 03/13/2007  . ALLERGIC RHINITIS 03/13/2007  . GERD 03/13/2007  . HAIR LOSS 03/13/2007  . LEG PAIN, CHRONIC  03/13/2007  . URINARY INCONTINENCE 03/13/2007  . COLONIC POLYPS, HX OF 03/13/2007  . HX, URINARY INFECTION 08/14/2006   Past Medical History  Diagnosis Date  . Allergic rhinitis   . GERD (gastroesophageal reflux disease)   . CAD (coronary artery disease)   . Hyperlipemia   . Hypertension   . Urinary incontinence     OVERACTIVE BLADDER  . Lichen sclerosus 63/01    (DX BY GYN AFTER ABN PAP)  . Pemphigoid, cicatricial     D/O AFFECTING EYES, MOUTH, THROAT NOW (DX [facility])  . Elevated LFTs   . History of hepatitis     unsure of type  . Colon  polyp   . Diverticulosis     sigmoid and descending colon  . Internal hemorrhoids   . Chronic kidney disease     overactive bladder  . Esophageal ring   . DM (diabetes mellitus)     no longer take any diabetic meds now that off Prednisone  . Shingles 03/13/2013   Past Surgical History  Procedure Laterality Date  . Oophorectomy    . Angioplasty      LAD  . Nose surgery      Biopsy  . Cataract extraction    . Cardiac electrophysiology mapping and ablation    . Colonoscopy    . Abdominal hysterectomy    . Esophagogastroduodenoscopy  09/14/2011    Procedure: ESOPHAGOGASTRODUODENOSCOPY (EGD);  Surgeon: Gatha Mayer, MD;  Location: [facility];  Service: Endoscopy;  Laterality: N/A;  needs xray  . Savory dilation  09/14/2011    Procedure: SAVORY DILATION;  Surgeon: Gatha Mayer, MD;  Location: [facility];  Service: Endoscopy;  Laterality: N/A;  . Esophagogastroduodenoscopy N/A 01/02/2013    Procedure: ESOPHAGOGASTRODUODENOSCOPY (EGD);  Surgeon: Gatha Mayer, MD;  Location: [facility];  Service: Endoscopy;  Laterality: N/A;  Venia Minks dilation N/A 01/02/2013    Procedure: Venia Minks DILATION;  Surgeon: Gatha Mayer, MD;  Location: [facility];  Service: Endoscopy;  Laterality: N/A;  . Savory dilation N/A 01/02/2013    Procedure: SAVORY DILATION;  Surgeon: Gatha Mayer, MD;  Location: [facility];  Service: Endoscopy;  Laterality: N/A;  . Esophagogastroduodenoscopy N/A 04/12/2013    Procedure: ESOPHAGOGASTRODUODENOSCOPY (EGD);  Surgeon: Gatha Mayer, MD;  Location: [facility];  Service: Endoscopy;  Laterality: N/A;  . Savory dilation N/A 04/12/2013    Procedure: SAVORY DILATION;  Surgeon: Gatha Mayer, MD;  Location: [facility];  Service: Endoscopy;  Laterality: N/A;   History  Substance Use Topics  . Smoking status: Former Smoker    Quit date: 02/29/1968  . Smokeless tobacco: Never Used  . Alcohol Use: No   Family History  Problem Relation Age of Onset  . Stroke  Mother     CVA  . Alzheimer's disease Mother   . Lung cancer Father   . Hypertension Brother   . Esophageal cancer Son 28  . Colon cancer Neg Hx   . Rectal cancer Neg Hx   . Stomach cancer Son 69   Allergies  Allergen Reactions  . Aspirin     REACTION: bleeds too easily-  . Lipitor [Atorvastatin]     REACTION: elevated LFT's  . Tetanus Toxoid     REACTION: rash  . Ultram [Tramadol]     dizzy   Current Outpatient Prescriptions on File Prior to Visit  Medication Sig Dispense Refill  . acetaminophen (TYLENOL) 325 MG tablet Take 325 mg by mouth. OTC as directed       .  Bimatoprost (LUMIGAN) 0.01 % SOLN One drop each eye daily       . omeprazole (PRILOSEC) 40 MG capsule Take 1 capsule (40 mg total) by mouth daily.  90 capsule  3  . prednisoLONE acetate (OMNIPRED) 1 % ophthalmic suspension One drop in each eye daily        No current facility-administered medications on file prior to visit.    Review of Systems Review of Systems  Constitutional: Negative for fever, appetite change, fatigue and unexpected weight change.  Eyes: Negative for pain and visual disturbance.  ENT pos for loss of gums due to pemphigoid / malfitting denture , neg for ST Respiratory: Negative for cough and shortness of breath.   Cardiovascular: Negative for cp or palpitations    Gastrointestinal: Negative for nausea, diarrhea and constipation.  Genitourinary: Negative for urgency and frequency.  Skin: Negative for pallor or rash   Neurological: Negative for weakness, light-headedness, numbness and headaches.  Hematological: Negative for adenopathy. Does not bruise/bleed easily.  Psychiatric/Behavioral: Negative for dysphoric mood. The patient is not nervous/anxious.         Objective:   Physical Exam  Constitutional: She appears well-developed and well-nourished. No distress.  HENT:  Head: Normocephalic and atraumatic.  Right Ear: External ear normal.  Left Ear: External ear normal.  Mouth/Throat:  Oropharynx is clear and moist.  Dentures noted   Eyes: Conjunctivae and EOM are normal. Pupils are equal, round, and reactive to light. No scleral icterus.  Neck: Normal range of motion. Neck supple. No JVD present. Carotid bruit is not present. No thyromegaly present.  Cardiovascular: Normal rate, regular rhythm, normal heart sounds and intact distal pulses.  Exam reveals no gallop.   Pulmonary/Chest: Effort normal and breath sounds normal. No respiratory distress. She has no wheezes. She exhibits no tenderness.  Abdominal: Soft. Bowel sounds are normal. She exhibits no distension, no abdominal bruit and no mass. There is no tenderness.  Genitourinary: No breast swelling, tenderness, discharge or bleeding.  Breast exam: No mass, nodules, thickening, tenderness, bulging, retraction, inflamation, nipple discharge or skin changes noted.  No axillary or clavicular LA.      Musculoskeletal: Normal range of motion. She exhibits no edema and no tenderness.  Lymphadenopathy:    She has no cervical adenopathy.  Neurological: She is alert. She has normal reflexes. No cranial nerve deficit. She exhibits normal muscle tone. Coordination normal.  Skin: Skin is warm and dry. No rash noted. No erythema. No pallor.  Thin skin with some old ecchymosis on arms   Few SKs   Psychiatric: She has a normal mood and affect.          Assessment & Plan:   Problem List Items Addressed This Visit     Cardiovascular and Mediastinum   Essential hypertension      bp in fair control at this time  BP Readings from Last 1 Encounters:  12/13/13 130/80   No changes needed Disc lifstyle change with low sodium diet and exercise  Overall stable -med refilled - lisinopril Reviewed labs     Relevant Medications      lisinopril (PRINIVIL,ZESTRIL) tablet      pravastatin (PRAVACHOL) tablet     Other   Hyperlipidemia LDL goal <100     LDL is up a bit Disc watching sat fat (eating more cheese) in diet  Disc  goals for lipids and reasons to control them Rev labs with pt Rev low sat fat diet in detail  Continue current  statin and diet     Relevant Medications      lisinopril (PRINIVIL,ZESTRIL) tablet      pravastatin (PRAVACHOL) tablet   Encounter for Medicare annual wellness exam - Primary     Reviewed health habits including diet and exercise and skin cancer prevention Reviewed appropriate screening tests for age  Also reviewed health mt list, fam hx and immunization status , as well as social and family history   See HPI Labs reviewed Flu shot and prevnar vaccines today    Colon cancer screening     Pt had polyps with last colonosc Will call GI to see when she is due for next one (? Due to age 4 skip) Given IFOB card today    Relevant Orders      Fecal occult blood, imunochemical    Other Visit Diagnoses   Need for prophylactic vaccination and inoculation against influenza        Relevant Orders       Flu Vaccine QUAD 36+ mos PF IM (Fluarix Quad PF) (Completed)    Need for vaccination with 13-polyvalent pneumococcal conjugate vaccine        Relevant Orders       Pneumococcal conjugate vaccine 13-valent (Completed)

## 2013-12-13 NOTE — Assessment & Plan Note (Addendum)
LDL is up a bit Disc watching sat fat (eating more cheese) in diet  Disc goals for lipids and reasons to control them Rev labs with pt Rev low sat fat diet in detail  Continue current statin and diet

## 2013-12-13 NOTE — Assessment & Plan Note (Signed)
bp in fair control at this time  BP Readings from Last 1 Encounters:  12/13/13 130/80   No changes needed Disc lifstyle change with low sodium diet and exercise  Overall stable -med refilled - lisinopril Reviewed labs

## 2013-12-16 ENCOUNTER — Telehealth: Payer: Self-pay | Admitting: Family Medicine

## 2013-12-16 NOTE — Telephone Encounter (Signed)
emmi mailed  °

## 2013-12-17 ENCOUNTER — Other Ambulatory Visit (INDEPENDENT_AMBULATORY_CARE_PROVIDER_SITE_OTHER): Payer: Medicare Other

## 2013-12-17 DIAGNOSIS — Z1211 Encounter for screening for malignant neoplasm of colon: Secondary | ICD-10-CM | POA: Diagnosis not present

## 2013-12-17 DIAGNOSIS — H16223 Keratoconjunctivitis sicca, not specified as Sjogren's, bilateral: Secondary | ICD-10-CM | POA: Diagnosis not present

## 2013-12-17 LAB — FECAL OCCULT BLOOD, IMMUNOCHEMICAL: Fecal Occult Bld: NEGATIVE

## 2013-12-18 ENCOUNTER — Encounter: Payer: Self-pay | Admitting: *Deleted

## 2014-01-06 ENCOUNTER — Other Ambulatory Visit: Payer: Self-pay | Admitting: Internal Medicine

## 2014-01-22 DIAGNOSIS — Z961 Presence of intraocular lens: Secondary | ICD-10-CM | POA: Diagnosis not present

## 2014-03-27 DIAGNOSIS — L570 Actinic keratosis: Secondary | ICD-10-CM | POA: Diagnosis not present

## 2014-03-27 DIAGNOSIS — L121 Cicatricial pemphigoid: Secondary | ICD-10-CM | POA: Diagnosis not present

## 2014-03-27 DIAGNOSIS — L719 Rosacea, unspecified: Secondary | ICD-10-CM | POA: Diagnosis not present

## 2014-04-07 ENCOUNTER — Other Ambulatory Visit: Payer: Self-pay | Admitting: Internal Medicine

## 2014-04-22 ENCOUNTER — Encounter: Payer: Self-pay | Admitting: Family Medicine

## 2014-04-22 ENCOUNTER — Ambulatory Visit (INDEPENDENT_AMBULATORY_CARE_PROVIDER_SITE_OTHER): Payer: Medicare Other | Admitting: Family Medicine

## 2014-04-22 VITALS — BP 132/88 | HR 76 | Temp 98.1°F | Ht 60.25 in | Wt 119.0 lb

## 2014-04-22 DIAGNOSIS — R252 Cramp and spasm: Secondary | ICD-10-CM | POA: Diagnosis not present

## 2014-04-22 LAB — BASIC METABOLIC PANEL
BUN: 12 mg/dL (ref 6–23)
CO2: 29 mEq/L (ref 19–32)
Calcium: 9.7 mg/dL (ref 8.4–10.5)
Chloride: 101 mEq/L (ref 96–112)
Creatinine, Ser: 0.77 mg/dL (ref 0.40–1.20)
GFR: 77.52 mL/min (ref >60.00)
Glucose, Bld: 103 mg/dL — ABNORMAL HIGH (ref 70–99)
Potassium: 4.5 mEq/L (ref 3.5–5.1)
Sodium: 135 mEq/L (ref 135–145)

## 2014-04-22 LAB — MAGNESIUM: Magnesium: 2.1 mg/dL (ref 1.5–2.5)

## 2014-04-22 NOTE — Progress Notes (Signed)
Pre visit review using our clinic review tool, if applicable. No additional management support is needed unless otherwise documented below in the visit note. 

## 2014-04-22 NOTE — Assessment & Plan Note (Signed)
Recurrent in L neck- leaving her with soreness Disc stretching and use of heat Check bmet and also mag level today for cramping Recommend tonic water  Pending lab- magnesium may help also  Enc balanced diet  PT may be opt if no improvement

## 2014-04-22 NOTE — Progress Notes (Signed)
Subjective:    Patient ID: Victoria Allison, female    DOB: 03/09/1940, 74 y.o.   MRN: [id number]  HPI Here for acute on chronic neck pain  She gets cramps / muscle spasms in her neck   Had 3 episodes of cramping on Friday Sore since then  L side - around occiput for the most part Cramps last about a minute-eased off by tilting head to the left  No bony pain   No change in vision or headache   Does occ get cramps in her feet   Patient Active Problem List   Diagnosis Date Noted  . Colon cancer screening 12/13/2013  . Esophageal dysphagia 04/12/2013  . Shingles 03/13/2013  . Encounter for Medicare annual wellness exam 12/10/2012  . Esophageal rings 08/24/2011  . SUPRAVENTRICULAR TACHYCARDIA 09/22/2009  . ARTHRITIS, SHOULDER 05/21/2009  . INSOMNIA 02/05/2009  . POSTMENOPAUSAL STATUS 06/02/2008  . PEMPHIGOID 11/14/2007  . Nonspecific (abnormal) findings on radiological and other examination of body structure 07/05/2007  . Hyperlipidemia LDL goal <100 03/13/2007  . Essential hypertension 03/13/2007  . CORONARY ARTERY DISEASE 03/13/2007  . ALLERGIC RHINITIS 03/13/2007  . GERD 03/13/2007  . HAIR LOSS 03/13/2007  . LEG PAIN, CHRONIC 03/13/2007  . URINARY INCONTINENCE 03/13/2007  . COLONIC POLYPS, HX OF 03/13/2007  . HX, URINARY INFECTION 08/14/2006   Past Medical History  Diagnosis Date  . Allergic rhinitis   . GERD (gastroesophageal reflux disease)   . CAD (coronary artery disease)   . Hyperlipemia   . Hypertension   . Urinary incontinence     OVERACTIVE BLADDER  . Lichen sclerosus 96/29    (DX BY GYN AFTER ABN PAP)  . Pemphigoid, cicatricial     D/O AFFECTING EYES, MOUTH, THROAT NOW (DX [facility])  . Elevated LFTs   . History of hepatitis     unsure of type  . Colon polyp   . Diverticulosis     sigmoid and descending colon  . Internal hemorrhoids   . Chronic kidney disease     overactive bladder  . Esophageal ring   . DM (diabetes mellitus)     no longer take  any diabetic meds now that off Prednisone  . Shingles 03/13/2013   Past Surgical History  Procedure Laterality Date  . Oophorectomy    . Angioplasty      LAD  . Nose surgery      Biopsy  . Cataract extraction    . Cardiac electrophysiology mapping and ablation    . Colonoscopy    . Abdominal hysterectomy    . Esophagogastroduodenoscopy  09/14/2011    Procedure: ESOPHAGOGASTRODUODENOSCOPY (EGD);  Surgeon: Gatha Mayer, MD;  Location: [facility];  Service: Endoscopy;  Laterality: N/A;  needs xray  . Savory dilation  09/14/2011    Procedure: SAVORY DILATION;  Surgeon: Gatha Mayer, MD;  Location: [facility];  Service: Endoscopy;  Laterality: N/A;  . Esophagogastroduodenoscopy N/A 01/02/2013    Procedure: ESOPHAGOGASTRODUODENOSCOPY (EGD);  Surgeon: Gatha Mayer, MD;  Location: [facility];  Service: Endoscopy;  Laterality: N/A;  Venia Minks dilation N/A 01/02/2013    Procedure: Venia Minks DILATION;  Surgeon: Gatha Mayer, MD;  Location: [facility];  Service: Endoscopy;  Laterality: N/A;  . Savory dilation N/A 01/02/2013    Procedure: SAVORY DILATION;  Surgeon: Gatha Mayer, MD;  Location: [facility];  Service: Endoscopy;  Laterality: N/A;  . Esophagogastroduodenoscopy N/A 04/12/2013    Procedure: ESOPHAGOGASTRODUODENOSCOPY (EGD);  Surgeon: Gatha Mayer, MD;  Location: [facility];  Service: Endoscopy;  Laterality: N/A;  . Savory dilation N/A 04/12/2013    Procedure: SAVORY DILATION;  Surgeon: Gatha Mayer, MD;  Location: [facility];  Service: Endoscopy;  Laterality: N/A;   History  Substance Use Topics  . Smoking status: Former Smoker    Quit date: 02/29/1968  . Smokeless tobacco: Never Used  . Alcohol Use: No   Family History  Problem Relation Age of Onset  . Stroke Mother     CVA  . Alzheimer's disease Mother   . Lung cancer Father   . Hypertension Brother   . Esophageal cancer Son 61  . Colon cancer Neg Hx   . Rectal cancer Neg Hx   . Stomach cancer Son 40    Allergies  Allergen Reactions  . Aspirin     REACTION: bleeds too easily-  . Lipitor [Atorvastatin]     REACTION: elevated LFT's  . Tetanus Toxoid     REACTION: rash  . Ultram [Tramadol]     dizzy   Current Outpatient Prescriptions on File Prior to Visit  Medication Sig Dispense Refill  . acetaminophen (TYLENOL) 325 MG tablet Take 325 mg by mouth. OTC as directed     . lisinopril (PRINIVIL,ZESTRIL) 10 MG tablet Take 1 tablet (10 mg total) by mouth daily. 90 tablet 3  . omeprazole (PRILOSEC) 40 MG capsule TAKE ONE CAPSULE BY MOUTH ONCE DAILY 90 capsule 0  . oxybutynin (DITROPAN XL) 15 MG 24 hr tablet Take 1 tablet (15 mg total) by mouth daily. 90 tablet 3  . pravastatin (PRAVACHOL) 40 MG tablet Take 1 tablet (40 mg total) by mouth daily. 90 tablet 3  . prednisoLONE acetate (OMNIPRED) 1 % ophthalmic suspension One drop in each eye daily      No current facility-administered medications on file prior to visit.     Review of Systems Review of Systems  Constitutional: Negative for fever, appetite change, fatigue and unexpected weight change.  Eyes: Negative for pain and visual disturbance.  Respiratory: Negative for cough and shortness of breath.   Cardiovascular: Negative for cp or palpitations    Gastrointestinal: Negative for nausea, diarrhea and constipation.  Genitourinary: Negative for urgency and frequency.  Skin: Negative for pallor or rash   MSK pos for L sided neck soreness and spasms  Neurological: Negative for weakness, light-headedness, numbness and headaches.  Hematological: Negative for adenopathy. Does not bruise/bleed easily.  Psychiatric/Behavioral: Negative for dysphoric mood. The patient is not nervous/anxious.         Objective:   Physical Exam  Constitutional: She appears well-developed and well-nourished. No distress.  HENT:  Head: Normocephalic and atraumatic.  Mouth/Throat: Oropharynx is clear and moist.  Eyes: Conjunctivae and EOM are normal.  Pupils are equal, round, and reactive to light. No scleral icterus.  Neck: Normal range of motion. Neck supple. No JVD present. Carotid bruit is not present. No tracheal deviation present. No thyromegaly present.  Nl rom of neck  Tenderness in L cervical musculature with spasm noted No bony tenderness    Cardiovascular: Normal rate and regular rhythm.   Pulmonary/Chest: Effort normal and breath sounds normal. No respiratory distress. She has no wheezes. She has no rales.  Musculoskeletal: She exhibits tenderness. She exhibits no edema.       Cervical back: She exhibits tenderness. She exhibits normal range of motion, no bony tenderness, no swelling and normal pulse.  Lymphadenopathy:    She has no cervical adenopathy.  Neurological: She is alert.  She has normal reflexes. She displays no atrophy. No cranial nerve deficit or sensory deficit. She exhibits normal muscle tone. Coordination normal.  Skin: Skin is warm and dry. No rash noted. No erythema. No pallor.  Psychiatric: She has a normal mood and affect.          Assessment & Plan:   Problem List Items Addressed This Visit      Other   Cramp in muscle - Primary    Recurrent in L neck- leaving her with soreness Disc stretching and use of heat Check bmet and also mag level today for cramping Recommend tonic water  Pending lab- magnesium may help also  Enc balanced diet  PT may be opt if no improvement       Relevant Orders   Basic metabolic panel (Completed)   Magnesium (Completed)

## 2014-04-22 NOTE — Patient Instructions (Signed)
Drink more water  Try drinking 4 oz of tonic water a day- that may help with cramps (it has quinine in it)  Lab today  I may end up recommending magnesium depending on lab results  Try to put heat on sore neck muscles when able  Keep stretching

## 2014-04-23 DIAGNOSIS — Z961 Presence of intraocular lens: Secondary | ICD-10-CM | POA: Diagnosis not present

## 2014-05-01 DIAGNOSIS — H40003 Preglaucoma, unspecified, bilateral: Secondary | ICD-10-CM | POA: Diagnosis not present

## 2014-06-12 DIAGNOSIS — H40003 Preglaucoma, unspecified, bilateral: Secondary | ICD-10-CM | POA: Diagnosis not present

## 2014-06-25 ENCOUNTER — Ambulatory Visit (INDEPENDENT_AMBULATORY_CARE_PROVIDER_SITE_OTHER): Payer: Medicare Other | Admitting: Primary Care

## 2014-06-25 ENCOUNTER — Encounter: Payer: Self-pay | Admitting: Primary Care

## 2014-06-25 VITALS — BP 130/78 | HR 80 | Temp 98.8°F | Wt 117.5 lb

## 2014-06-25 DIAGNOSIS — J069 Acute upper respiratory infection, unspecified: Secondary | ICD-10-CM | POA: Diagnosis not present

## 2014-06-25 MED ORDER — FLUTICASONE PROPIONATE 50 MCG/ACT NA SUSP: 2.0000 | Freq: Every day | NASAL | Status: DC

## 2014-06-25 MED ORDER — BENZONATATE 200 MG PO CAPS: 200.0000 mg | ORAL_CAPSULE | Freq: Three times a day (TID) | ORAL | Status: DC | PRN

## 2014-06-25 NOTE — Patient Instructions (Signed)
You may take ibuprofen 400 mg three times daily as needed for your sore throat. This may be purchased over the counter. Use the Flonase nasal spray daily to each nostril for nasal congestion. Take the Tessalon Pearls three times daily as needed for cough. You may also take a daily Claritin or Zyrtec to help with allergy symptoms. It was a pleasure meeting you!  Upper Respiratory Infection, Adult An upper respiratory infection (URI) is also sometimes known as the common cold. The upper respiratory tract includes the nose, sinuses, throat, trachea, and bronchi. Bronchi are the airways leading to the lungs. Most people improve within 1 week, but symptoms can last up to 2 weeks. A residual cough may last even longer.  CAUSES Many different viruses can infect the tissues lining the upper respiratory tract. The tissues become irritated and inflamed and often become very moist. Mucus production is also common. A cold is contagious. You can easily spread the virus to others by oral contact. This includes kissing, sharing a glass, coughing, or sneezing. Touching your mouth or nose and then touching a surface, which is then touched by another person, can also spread the virus. SYMPTOMS  Symptoms typically develop 1 to 3 days after you come in contact with a cold virus. Symptoms vary from person to person. They may include:  Runny nose.  Sneezing.  Nasal congestion.  Sinus irritation.  Sore throat.  Loss of voice (laryngitis).  Cough.  Fatigue.  Muscle aches.  Loss of appetite.  Headache.  Low-grade fever. DIAGNOSIS  You might diagnose your own cold based on familiar symptoms, since most people get a cold 2 to 3 times a year. Your caregiver can confirm this based on your exam. Most importantly, your caregiver can check that your symptoms are not due to another disease such as strep throat, sinusitis, pneumonia, asthma, or epiglottitis. Blood tests, throat tests, and X-rays are not necessary  to diagnose a common cold, but they may sometimes be helpful in excluding other more serious diseases. Your caregiver will decide if any further tests are required. RISKS AND COMPLICATIONS  You may be at risk for a more severe case of the common cold if you smoke cigarettes, have chronic heart disease (such as heart failure) or lung disease (such as asthma), or if you have a weakened immune system. The very young and very old are also at risk for more serious infections. Bacterial sinusitis, middle ear infections, and bacterial pneumonia can complicate the common cold. The common cold can worsen asthma and chronic obstructive pulmonary disease (COPD). Sometimes, these complications can require emergency medical care and may be life-threatening. PREVENTION  The best way to protect against getting a cold is to practice good hygiene. Avoid oral or hand contact with people with cold symptoms. Wash your hands often if contact occurs. There is no clear evidence that vitamin C, vitamin E, echinacea, or exercise reduces the chance of developing a cold. However, it is always recommended to get plenty of rest and practice good nutrition. TREATMENT  Treatment is directed at relieving symptoms. There is no cure. Antibiotics are not effective, because the infection is caused by a virus, not by bacteria. Treatment may include:  Increased fluid intake. Sports drinks offer valuable electrolytes, sugars, and fluids.  Breathing heated mist or steam (vaporizer or shower).  Eating chicken soup or other clear broths, and maintaining good nutrition.  Getting plenty of rest.  Using gargles or lozenges for comfort.  Controlling fevers with ibuprofen or acetaminophen  as directed by your caregiver.  Increasing usage of your inhaler if you have asthma. Zinc gel and zinc lozenges, taken in the first 24 hours of the common cold, can shorten the duration and lessen the severity of symptoms. Pain medicines may help with  fever, muscle aches, and throat pain. A variety of non-prescription medicines are available to treat congestion and runny nose. Your caregiver can make recommendations and may suggest nasal or lung inhalers for other symptoms.  HOME CARE INSTRUCTIONS   Only take over-the-counter or prescription medicines for pain, discomfort, or fever as directed by your caregiver.  Use a warm mist humidifier or inhale steam from a shower to increase air moisture. This may keep secretions moist and make it easier to breathe.  Drink enough water and fluids to keep your urine clear or pale yellow.  Rest as needed.  Return to work when your temperature has returned to normal or as your caregiver advises. You may need to stay home longer to avoid infecting others. You can also use a face mask and careful hand washing to prevent spread of the virus. SEEK MEDICAL CARE IF:   After the first few days, you feel you are getting worse rather than better.  You need your caregiver's advice about medicines to control symptoms.  You develop chills, worsening shortness of breath, or brown or red sputum. These may be signs of pneumonia.  You develop yellow or brown nasal discharge or pain in the face, especially when you bend forward. These may be signs of sinusitis.  You develop a fever, swollen neck glands, pain with swallowing, or white areas in the back of your throat. These may be signs of strep throat. SEEK IMMEDIATE MEDICAL CARE IF:   You have a fever.  You develop severe or persistent headache, ear pain, sinus pain, or chest pain.  You develop wheezing, a prolonged cough, cough up blood, or have a change in your usual mucus (if you have chronic lung disease).  You develop sore muscles or a stiff neck. Document Released: 08/10/2000 Document Revised: 05/09/2011 Document Reviewed: 05/22/2013 [facility] Patient Information 2015 [place], [place]. This information is not intended to replace advice given to you by your  health care provider. Make sure you discuss any questions you have with your health care provider.

## 2014-06-25 NOTE — Progress Notes (Signed)
Subjective:    Patient ID: Victoria Allison, female    DOB: 1940-10-19, 74 y.o.   MRN: [id number]  HPI  Victoria Allison is a 74 year old female who presents today with a chief complaint of cough and chest congestion.  Her symptoms began Sunday with hoarseness and sore throat, then she developed a cough and congestion this Monday. Her cough is non productive and also reports nasal congestion, sinus pressure, and feeling "under the weather". She's used Afrin nasal spray once with some relief of nasal congestion. Denies fevers, chills, shortness of breath.  Review of Systems  Constitutional: Positive for fatigue. Negative for fever and chills.  HENT: Positive for congestion, ear pain, sinus pressure, sneezing and sore throat.   Eyes: Negative for itching.  Respiratory: Negative for shortness of breath.   Cardiovascular: Negative for chest pain.  Gastrointestinal: Negative for nausea and vomiting.  Musculoskeletal: Positive for myalgias.       Past Medical History  Diagnosis Date  . Allergic rhinitis   . GERD (gastroesophageal reflux disease)   . CAD (coronary artery disease)   . Hyperlipemia   . Hypertension   . Urinary incontinence     OVERACTIVE BLADDER  . Lichen sclerosus 78/29    (DX BY GYN AFTER ABN PAP)  . Pemphigoid, cicatricial     D/O AFFECTING EYES, MOUTH, THROAT NOW (DX [facility])  . Elevated LFTs   . History of hepatitis     unsure of type  . Colon polyp   . Diverticulosis     sigmoid and descending colon  . Internal hemorrhoids   . Chronic kidney disease     overactive bladder  . Esophageal ring   . DM (diabetes mellitus)     no longer take any diabetic meds now that off Prednisone  . Shingles 03/13/2013    History   Social History  . Marital Status: Married    Spouse Name: N/A  . Number of Children: 2  . Years of Education: N/A   Occupational History  . Retired    Social History Main Topics  . Smoking status: Former Smoker    Quit date: 02/29/1968  .  Smokeless tobacco: Never Used  . Alcohol Use: No  . Drug Use: No  . Sexual Activity: Not on file   Other Topics Concern  . Not on file   Social History Narrative   Husband is fighting cancer. Exercises on stationary bike/walks   Daily caffeine     Past Surgical History  Procedure Laterality Date  . Oophorectomy    . Angioplasty      LAD  . Nose surgery      Biopsy  . Cataract extraction    . Cardiac electrophysiology mapping and ablation    . Colonoscopy    . Abdominal hysterectomy    . Esophagogastroduodenoscopy  09/14/2011    Procedure: ESOPHAGOGASTRODUODENOSCOPY (EGD);  Surgeon: Victoria Mayer, MD;  Location: [facility];  Service: Endoscopy;  Laterality: N/A;  needs xray  . Savory dilation  09/14/2011    Procedure: SAVORY DILATION;  Surgeon: Victoria Mayer, MD;  Location: [facility];  Service: Endoscopy;  Laterality: N/A;  . Esophagogastroduodenoscopy N/A 01/02/2013    Procedure: ESOPHAGOGASTRODUODENOSCOPY (EGD);  Surgeon: Victoria Mayer, MD;  Location: [facility];  Service: Endoscopy;  Laterality: N/A;  Venia Minks dilation N/A 01/02/2013    Procedure: Venia Minks DILATION;  Surgeon: Victoria Mayer, MD;  Location: [facility];  Service: Endoscopy;  Laterality: N/A;  .  Savory dilation N/A 01/02/2013    Procedure: SAVORY DILATION;  Surgeon: Victoria Mayer, MD;  Location: [facility];  Service: Endoscopy;  Laterality: N/A;  . Esophagogastroduodenoscopy N/A 04/12/2013    Procedure: ESOPHAGOGASTRODUODENOSCOPY (EGD);  Surgeon: Victoria Mayer, MD;  Location: [facility];  Service: Endoscopy;  Laterality: N/A;  . Savory dilation N/A 04/12/2013    Procedure: SAVORY DILATION;  Surgeon: Victoria Mayer, MD;  Location: [facility];  Service: Endoscopy;  Laterality: N/A;    Family History  Problem Relation Age of Onset  . Stroke Mother     CVA  . Alzheimer's disease Mother   . Lung cancer Father   . Hypertension Brother   . Esophageal cancer Son 73  . Colon cancer Neg Hx   .  Rectal cancer Neg Hx   . Stomach cancer Son 81    Allergies  Allergen Reactions  . Aspirin     REACTION: bleeds too easily-  . Lipitor [Atorvastatin]     REACTION: elevated LFT's  . Tetanus Toxoid     REACTION: rash  . Ultram [Tramadol]     dizzy    Current Outpatient Prescriptions on File Prior to Visit  Medication Sig Dispense Refill  . acetaminophen (TYLENOL) 325 MG tablet Take 325 mg by mouth. OTC as directed     . lisinopril (PRINIVIL,ZESTRIL) 10 MG tablet Take 1 tablet (10 mg total) by mouth daily. 90 tablet 3  . metroNIDAZOLE (METROCREAM) 0.75 % cream Apply 1 application topically 2 (two) times daily.    Marland Kitchen omeprazole (PRILOSEC) 40 MG capsule TAKE ONE CAPSULE BY MOUTH ONCE DAILY 90 capsule 0  . oxybutynin (DITROPAN XL) 15 MG 24 hr tablet Take 1 tablet (15 mg total) by mouth daily. 90 tablet 3  . pravastatin (PRAVACHOL) 40 MG tablet Take 1 tablet (40 mg total) by mouth daily. 90 tablet 3  . prednisoLONE acetate (OMNIPRED) 1 % ophthalmic suspension One drop in each eye daily      No current facility-administered medications on file prior to visit.    BP 130/78 mmHg  Pulse 80  Temp(Src) 98.8 F (37.1 C) (Oral)  Wt 117 lb 8 oz (53.298 kg)  SpO2 99%  LMP 02/29/1968    Objective:   Physical Exam  Constitutional: She is oriented to person, place, and time. She appears well-developed.  Appears well, not sickly.  HENT:  Right Ear: Tympanic membrane, external ear and ear canal normal.  Left Ear: Tympanic membrane, external ear and ear canal normal.  Nose: Nose normal.  Mouth/Throat: Oropharynx is clear and moist.  Eyes: Conjunctivae are normal. Pupils are equal, round, and reactive to light.  Neck: Neck supple.  Cardiovascular: Normal rate and regular rhythm.   Pulmonary/Chest: Effort normal and breath sounds normal.  Lymphadenopathy:    She has no cervical adenopathy.  Neurological: She is alert and oriented to person, place, and time.  Skin: Skin is warm and dry.  No rash noted.          Assessment & Plan:  Upper respiratory tract infection:  Suspect viral URI do to examination, symptoms, and physical appearance. Lungs clear, do no suspect pneumonia. RX for Flonase for nasal congestion, Tessalon Pearls for cough. Encouraged plenty of fluids. May take Claritin for allergy symptoms. Follow up if no improvement or worsening symptoms.

## 2014-06-25 NOTE — Progress Notes (Signed)
Pre visit review using our clinic review tool, if applicable. No additional management support is needed unless otherwise documented below in the visit note. 

## 2014-07-07 ENCOUNTER — Encounter: Payer: Self-pay | Admitting: Family Medicine

## 2014-07-07 ENCOUNTER — Ambulatory Visit (INDEPENDENT_AMBULATORY_CARE_PROVIDER_SITE_OTHER): Payer: Medicare Other | Admitting: Family Medicine

## 2014-07-07 VITALS — BP 122/74 | HR 99 | Temp 98.8°F | Ht 60.25 in | Wt 117.2 lb

## 2014-07-07 DIAGNOSIS — J0100 Acute maxillary sinusitis, unspecified: Secondary | ICD-10-CM | POA: Insufficient documentation

## 2014-07-07 DIAGNOSIS — J069 Acute upper respiratory infection, unspecified: Secondary | ICD-10-CM | POA: Diagnosis not present

## 2014-07-07 DIAGNOSIS — K219 Gastro-esophageal reflux disease without esophagitis: Secondary | ICD-10-CM

## 2014-07-07 MED ORDER — AMOXICILLIN-POT CLAVULANATE 875-125 MG PO TABS: 1.0000 | ORAL_TABLET | Freq: Two times a day (BID) | ORAL | Status: DC

## 2014-07-07 MED ORDER — OMEPRAZOLE 40 MG PO CPDR
40.0000 mg | DELAYED_RELEASE_CAPSULE | Freq: Every day | ORAL | Status: DC
Start: 2014-07-07 — End: 2015-07-06

## 2014-07-07 MED ORDER — BENZONATATE 200 MG PO CAPS
200.0000 mg | ORAL_CAPSULE | Freq: Three times a day (TID) | ORAL | Status: DC | PRN
Start: 2014-07-07 — End: 2015-05-15

## 2014-07-07 NOTE — Assessment & Plan Note (Signed)
Pt asked for refill of omeprazole 40 (20 did not work)- since she is no longer seeing GI Sent to [facility]

## 2014-07-07 NOTE — Progress Notes (Signed)
Pre visit review using our clinic review tool, if applicable. No additional management support is needed unless otherwise documented below in the visit note. 

## 2014-07-07 NOTE — Progress Notes (Signed)
Subjective:    Patient ID: Victoria Allison, female    DOB: 17-Nov-1940, 75 y.o.   MRN: [id number]  HPI Here for f/u for uri not improving   Bad chest congestion and cough  Was given tessalon for cough- finished that  claritin - does not tolerate  Does not feel good   Does not think she has a fever  Is wiped out  Cough is prod of yellow mucous to green  A little wheezing   Nasal stuffiness  Some facial pain around eyes  Ears are ok - no pain Throat is raw   Patient Active Problem List   Diagnosis Date Noted  . Cramp in muscle 04/22/2014  . Colon cancer screening 12/13/2013  . Esophageal dysphagia 04/12/2013  . Shingles 03/13/2013  . Encounter for Medicare annual wellness exam 12/10/2012  . Esophageal rings 08/24/2011  . SUPRAVENTRICULAR TACHYCARDIA 09/22/2009  . ARTHRITIS, SHOULDER 05/21/2009  . INSOMNIA 02/05/2009  . POSTMENOPAUSAL STATUS 06/02/2008  . PEMPHIGOID 11/14/2007  . Nonspecific (abnormal) findings on radiological and other examination of body structure 07/05/2007  . Hyperlipidemia LDL goal <100 03/13/2007  . Essential hypertension 03/13/2007  . CORONARY ARTERY DISEASE 03/13/2007  . ALLERGIC RHINITIS 03/13/2007  . GERD 03/13/2007  . HAIR LOSS 03/13/2007  . LEG PAIN, CHRONIC 03/13/2007  . URINARY INCONTINENCE 03/13/2007  . COLONIC POLYPS, HX OF 03/13/2007  . HX, URINARY INFECTION 08/14/2006   Past Medical History  Diagnosis Date  . Allergic rhinitis   . GERD (gastroesophageal reflux disease)   . CAD (coronary artery disease)   . Hyperlipemia   . Hypertension   . Urinary incontinence     OVERACTIVE BLADDER  . Lichen sclerosus 82/42    (DX BY GYN AFTER ABN PAP)  . Pemphigoid, cicatricial     D/O AFFECTING EYES, MOUTH, THROAT NOW (DX [facility])  . Elevated LFTs   . History of hepatitis     unsure of type  . Colon polyp   . Diverticulosis     sigmoid and descending colon  . Internal hemorrhoids   . Chronic kidney disease     overactive bladder  .  Esophageal ring   . DM (diabetes mellitus)     no longer take any diabetic meds now that off Prednisone  . Shingles 03/13/2013   Past Surgical History  Procedure Laterality Date  . Oophorectomy    . Angioplasty      LAD  . Nose surgery      Biopsy  . Cataract extraction    . Cardiac electrophysiology mapping and ablation    . Colonoscopy    . Abdominal hysterectomy    . Esophagogastroduodenoscopy  09/14/2011    Procedure: ESOPHAGOGASTRODUODENOSCOPY (EGD);  Surgeon: Gatha Mayer, MD;  Location: [facility];  Service: Endoscopy;  Laterality: N/A;  needs xray  . Savory dilation  09/14/2011    Procedure: SAVORY DILATION;  Surgeon: Gatha Mayer, MD;  Location: [facility];  Service: Endoscopy;  Laterality: N/A;  . Esophagogastroduodenoscopy N/A 01/02/2013    Procedure: ESOPHAGOGASTRODUODENOSCOPY (EGD);  Surgeon: Gatha Mayer, MD;  Location: [facility];  Service: Endoscopy;  Laterality: N/A;  Venia Minks dilation N/A 01/02/2013    Procedure: Venia Minks DILATION;  Surgeon: Gatha Mayer, MD;  Location: [facility];  Service: Endoscopy;  Laterality: N/A;  . Savory dilation N/A 01/02/2013    Procedure: SAVORY DILATION;  Surgeon: Gatha Mayer, MD;  Location: [facility];  Service: Endoscopy;  Laterality: N/A;  . Esophagogastroduodenoscopy N/A 04/12/2013  Procedure: ESOPHAGOGASTRODUODENOSCOPY (EGD);  Surgeon: Gatha Mayer, MD;  Location: [facility];  Service: Endoscopy;  Laterality: N/A;  . Savory dilation N/A 04/12/2013    Procedure: SAVORY DILATION;  Surgeon: Gatha Mayer, MD;  Location: [facility];  Service: Endoscopy;  Laterality: N/A;   History  Substance Use Topics  . Smoking status: Former Smoker    Quit date: 02/29/1968  . Smokeless tobacco: Never Used  . Alcohol Use: No   Family History  Problem Relation Age of Onset  . Stroke Mother     CVA  . Alzheimer's disease Mother   . Lung cancer Father   . Hypertension Brother   . Esophageal cancer Son 71  . Colon  cancer Neg Hx   . Rectal cancer Neg Hx   . Stomach cancer Son 49   Allergies  Allergen Reactions  . Aspirin     REACTION: bleeds too easily-  . Lipitor [Atorvastatin]     REACTION: elevated LFT's  . Tetanus Toxoid     REACTION: rash  . Ultram [Tramadol]     dizzy   Current Outpatient Prescriptions on File Prior to Visit  Medication Sig Dispense Refill  . acetaminophen (TYLENOL) 325 MG tablet Take 325 mg by mouth. OTC as directed     . fluticasone (FLONASE) 50 MCG/ACT nasal spray Place 2 sprays into both nostrils daily. 16 g 1  . lisinopril (PRINIVIL,ZESTRIL) 10 MG tablet Take 1 tablet (10 mg total) by mouth daily. 90 tablet 3  . metroNIDAZOLE (METROCREAM) 0.75 % cream Apply 1 application topically 2 (two) times daily.    Marland Kitchen omeprazole (PRILOSEC) 40 MG capsule TAKE ONE CAPSULE BY MOUTH ONCE DAILY 90 capsule 0  . oxybutynin (DITROPAN XL) 15 MG 24 hr tablet Take 1 tablet (15 mg total) by mouth daily. 90 tablet 3  . pravastatin (PRAVACHOL) 40 MG tablet Take 1 tablet (40 mg total) by mouth daily. 90 tablet 3  . prednisoLONE acetate (OMNIPRED) 1 % ophthalmic suspension One drop in each eye daily      No current facility-administered medications on file prior to visit.     Review of Systems Review of Systems  Constitutional: Negative for fever, appetite change, and unexpected weight change. pos for malaise/fatigue ENT pos for cong and rhinorrhea and sinus pain and ST Eyes: Negative for pain and visual disturbance.  Respiratory: Negative for shortness of breath.   Cardiovascular: Negative for cp or palpitations    Gastrointestinal: Negative for nausea, diarrhea and constipation.  Genitourinary: Negative for urgency and frequency.  Skin: Negative for pallor or rash   Neurological: Negative for weakness, light-headedness, numbness and headaches.  Hematological: Negative for adenopathy. Does not bruise/bleed easily.  Psychiatric/Behavioral: Negative for dysphoric mood. The patient is not  nervous/anxious.         Objective:   Physical Exam  Constitutional: She appears well-developed and well-nourished. No distress.  HENT:  Head: Normocephalic and atraumatic.  Right Ear: External ear normal.  Left Ear: External ear normal.  Mouth/Throat: Oropharynx is clear and moist. No oropharyngeal exudate.  Nares are injected and congested  Bilateral maxillary sinus tenderness  Post nasal drip   Eyes: Conjunctivae and EOM are normal. Pupils are equal, round, and reactive to light. Right eye exhibits no discharge. Left eye exhibits no discharge.  Neck: Normal range of motion. Neck supple.  Cardiovascular: Normal rate and regular rhythm.   Pulmonary/Chest: Effort normal and breath sounds normal. No respiratory distress. She has no wheezes. She has no rales.  Lymphadenopathy:    She has no cervical adenopathy.  Neurological: She is alert. No cranial nerve deficit.  Skin: Skin is warm and dry. No rash noted.  Psychiatric: She has a normal mood and affect.          Assessment & Plan:   Problem List Items Addressed This Visit    Acute sinusitis    S/p uri Cover with augmentin Disc symptomatic care - see instructions on AVS  Refilled tessalon  Update if not starting to improve in a week or if worsening        Relevant Medications   amoxicillin-clavulanate (AUGMENTIN) 875-125 MG per tablet   benzonatate (TESSALON) 200 MG capsule   GERD    Pt asked for refill of omeprazole 40 (20 did not work)- since she is no longer seeing GI Sent to [facility]      Relevant Medications   omeprazole (PRILOSEC) 40 MG capsule    Other Visit Diagnoses    Upper respiratory tract infection    -  Primary    Relevant Medications    benzonatate (TESSALON) 200 MG capsule

## 2014-07-07 NOTE — Patient Instructions (Signed)
I think your cold has turned into a sinus infection  Drink lots of fluids Take the augmentin as directed Tessalon for cough  Rest  Update if not starting to improve in a week or if worsening

## 2014-07-07 NOTE — Assessment & Plan Note (Signed)
S/p uri Cover with augmentin Disc symptomatic care - see instructions on AVS  Refilled tessalon  Update if not starting to improve in a week or if worsening

## 2014-07-31 DIAGNOSIS — L570 Actinic keratosis: Secondary | ICD-10-CM | POA: Diagnosis not present

## 2014-07-31 DIAGNOSIS — L121 Cicatricial pemphigoid: Secondary | ICD-10-CM | POA: Diagnosis not present

## 2014-09-11 DIAGNOSIS — H40003 Preglaucoma, unspecified, bilateral: Secondary | ICD-10-CM | POA: Diagnosis not present

## 2014-09-18 DIAGNOSIS — H40003 Preglaucoma, unspecified, bilateral: Secondary | ICD-10-CM | POA: Diagnosis not present

## 2014-09-18 DIAGNOSIS — Z961 Presence of intraocular lens: Secondary | ICD-10-CM | POA: Diagnosis not present

## 2014-11-04 ENCOUNTER — Encounter: Payer: Self-pay | Admitting: Internal Medicine

## 2014-12-18 DIAGNOSIS — L570 Actinic keratosis: Secondary | ICD-10-CM | POA: Diagnosis not present

## 2014-12-18 DIAGNOSIS — D0439 Carcinoma in situ of skin of other parts of face: Secondary | ICD-10-CM | POA: Diagnosis not present

## 2014-12-18 DIAGNOSIS — D489 Neoplasm of uncertain behavior, unspecified: Secondary | ICD-10-CM | POA: Diagnosis not present

## 2014-12-25 ENCOUNTER — Other Ambulatory Visit: Payer: Self-pay | Admitting: Family Medicine

## 2015-01-16 DIAGNOSIS — Z961 Presence of intraocular lens: Secondary | ICD-10-CM | POA: Diagnosis not present

## 2015-01-29 DIAGNOSIS — L905 Scar conditions and fibrosis of skin: Secondary | ICD-10-CM | POA: Diagnosis not present

## 2015-01-29 DIAGNOSIS — L570 Actinic keratosis: Secondary | ICD-10-CM | POA: Diagnosis not present

## 2015-01-29 DIAGNOSIS — L908 Other atrophic disorders of skin: Secondary | ICD-10-CM | POA: Diagnosis not present

## 2015-01-29 DIAGNOSIS — C4492 Squamous cell carcinoma of skin, unspecified: Secondary | ICD-10-CM | POA: Diagnosis not present

## 2015-01-29 DIAGNOSIS — L578 Other skin changes due to chronic exposure to nonionizing radiation: Secondary | ICD-10-CM | POA: Diagnosis not present

## 2015-01-29 DIAGNOSIS — Z85828 Personal history of other malignant neoplasm of skin: Secondary | ICD-10-CM | POA: Diagnosis not present

## 2015-02-17 ENCOUNTER — Other Ambulatory Visit: Payer: Self-pay | Admitting: Family Medicine

## 2015-03-16 ENCOUNTER — Other Ambulatory Visit: Payer: Self-pay | Admitting: Family Medicine

## 2015-03-16 NOTE — Telephone Encounter (Signed)
Received refill request electronically Last office visit 07/07/14 See allergy/contraindication Is it okay to refill?

## 2015-03-16 NOTE — Telephone Encounter (Signed)
Please schedule f/u in May and refill until then

## 2015-03-17 NOTE — Telephone Encounter (Signed)
appt scheduled and med refilled 

## 2015-03-31 ENCOUNTER — Other Ambulatory Visit: Payer: Self-pay | Admitting: Family Medicine

## 2015-04-14 ENCOUNTER — Other Ambulatory Visit: Payer: Self-pay | Admitting: Internal Medicine

## 2015-04-14 DIAGNOSIS — Z20828 Contact with and (suspected) exposure to other viral communicable diseases: Secondary | ICD-10-CM | POA: Insufficient documentation

## 2015-04-14 MED ORDER — OSELTAMIVIR PHOSPHATE 75 MG PO CAPS: 75.0000 mg | ORAL_CAPSULE | Freq: Every day | ORAL | Status: DC

## 2015-05-15 ENCOUNTER — Encounter: Payer: Self-pay | Admitting: Family Medicine

## 2015-05-15 ENCOUNTER — Ambulatory Visit (INDEPENDENT_AMBULATORY_CARE_PROVIDER_SITE_OTHER): Payer: Medicare Other | Admitting: Family Medicine

## 2015-05-15 VITALS — BP 122/68 | HR 72 | Temp 98.2°F | Ht 60.25 in | Wt 119.5 lb

## 2015-05-15 DIAGNOSIS — J0100 Acute maxillary sinusitis, unspecified: Secondary | ICD-10-CM

## 2015-05-15 DIAGNOSIS — J069 Acute upper respiratory infection, unspecified: Secondary | ICD-10-CM | POA: Diagnosis not present

## 2015-05-15 MED ORDER — FLUTICASONE PROPIONATE 50 MCG/ACT NA SUSP: 2.0000 | Freq: Every day | NASAL | Status: DC

## 2015-05-15 MED ORDER — LISINOPRIL 10 MG PO TABS: 10.0000 mg | ORAL_TABLET | Freq: Every day | ORAL | Status: DC

## 2015-05-15 MED ORDER — AMOXICILLIN-POT CLAVULANATE 875-125 MG PO TABS: 1.0000 | ORAL_TABLET | Freq: Two times a day (BID) | ORAL | Status: DC

## 2015-05-15 NOTE — Progress Notes (Signed)
Subjective:    Patient ID: Victoria Allison, female    DOB: [birth year], 75 y.o.   MRN: [id number]  HPI Here for uri symptoms  Started 2/12    Husband had the flu  Dr Derrel Nip gave her tamiflu to prevent flu     Has a bad cough - prod / also has it in throat from pnd- cannot bring it up  Sinus pain in face  Blowing nose - purulent drainage and also dry  Congestion  Thinks she may have had a mild case of flu (was on tamiflu)  She had body aches but not much of a fever - was 101 one day only  Patient Active Problem List   Diagnosis Date Noted  . Exposure to influenza 04/14/2015  . Acute sinusitis 07/07/2014  . Cramp in muscle 04/22/2014  . Colon cancer screening 12/13/2013  . Esophageal dysphagia 04/12/2013  . Shingles 03/13/2013  . Encounter for Medicare annual wellness exam 12/10/2012  . Esophageal rings 08/24/2011  . SUPRAVENTRICULAR TACHYCARDIA 09/22/2009  . ARTHRITIS, SHOULDER 05/21/2009  . INSOMNIA 02/05/2009  . POSTMENOPAUSAL STATUS 06/02/2008  . PEMPHIGOID 11/14/2007  . Nonspecific (abnormal) findings on radiological and other examination of body structure 07/05/2007  . Hyperlipidemia LDL goal <100 03/13/2007  . Essential hypertension 03/13/2007  . CORONARY ARTERY DISEASE 03/13/2007  . ALLERGIC RHINITIS 03/13/2007  . GERD 03/13/2007  . HAIR LOSS 03/13/2007  . LEG PAIN, CHRONIC 03/13/2007  . URINARY INCONTINENCE 03/13/2007  . COLONIC POLYPS, HX OF 03/13/2007  . HX, URINARY INFECTION 08/14/2006   Past Medical History  Diagnosis Date  . Allergic rhinitis   . GERD (gastroesophageal reflux disease)   . CAD (coronary artery disease)   . Hyperlipemia   . Hypertension   . Urinary incontinence     OVERACTIVE BLADDER  . Lichen sclerosus 123456    (DX BY GYN AFTER ABN PAP)  . Pemphigoid, cicatricial     D/O AFFECTING EYES, MOUTH, THROAT NOW (DX [facility])  . Elevated LFTs   . History of hepatitis     unsure of type  . Colon polyp   . Diverticulosis     sigmoid and  descending colon  . Internal hemorrhoids   . Chronic kidney disease     overactive bladder  . Esophageal ring   . DM (diabetes mellitus) ([place])     no longer take any diabetic meds now that off Prednisone  . Shingles 03/13/2013   Past Surgical History  Procedure Laterality Date  . Oophorectomy    . Angioplasty      LAD  . Nose surgery      Biopsy  . Cataract extraction    . Cardiac electrophysiology mapping and ablation    . Colonoscopy    . Abdominal hysterectomy    . Esophagogastroduodenoscopy  09/14/2011    Procedure: ESOPHAGOGASTRODUODENOSCOPY (EGD);  Surgeon: Gatha Mayer, MD;  Location: [facility];  Service: Endoscopy;  Laterality: N/A;  needs xray  . Savory dilation  09/14/2011    Procedure: SAVORY DILATION;  Surgeon: Gatha Mayer, MD;  Location: [facility];  Service: Endoscopy;  Laterality: N/A;  . Esophagogastroduodenoscopy N/A 01/02/2013    Procedure: ESOPHAGOGASTRODUODENOSCOPY (EGD);  Surgeon: Gatha Mayer, MD;  Location: [facility];  Service: Endoscopy;  Laterality: N/A;  Venia Minks dilation N/A 01/02/2013    Procedure: Venia Minks DILATION;  Surgeon: Gatha Mayer, MD;  Location: [facility];  Service: Endoscopy;  Laterality: N/A;  . Savory dilation N/A 01/02/2013  Procedure: SAVORY DILATION;  Surgeon: Gatha Mayer, MD;  Location: [facility];  Service: Endoscopy;  Laterality: N/A;  . Esophagogastroduodenoscopy N/A 04/12/2013    Procedure: ESOPHAGOGASTRODUODENOSCOPY (EGD);  Surgeon: Gatha Mayer, MD;  Location: [facility];  Service: Endoscopy;  Laterality: N/A;  . Savory dilation N/A 04/12/2013    Procedure: SAVORY DILATION;  Surgeon: Gatha Mayer, MD;  Location: [facility];  Service: Endoscopy;  Laterality: N/A;   Social History  Substance Use Topics  . Smoking status: Former Smoker    Quit date: 02/29/1968  . Smokeless tobacco: Never Used  . Alcohol Use: No   Family History  Problem Relation Age of Onset  . Stroke Mother     CVA  . Alzheimer's  disease Mother   . Lung cancer Father   . Hypertension Brother   . Esophageal cancer Son 29  . Colon cancer Neg Hx   . Rectal cancer Neg Hx   . Stomach cancer Son 39   Allergies  Allergen Reactions  . Aspirin     REACTION: bleeds too easily-  . Lipitor [Atorvastatin]     REACTION: elevated LFT's  . Tetanus Toxoid     REACTION: rash  . Ultram [Tramadol]     dizzy   Current Outpatient Prescriptions on File Prior to Visit  Medication Sig Dispense Refill  . acetaminophen (TYLENOL) 325 MG tablet Take 325 mg by mouth. OTC as directed     . metroNIDAZOLE (METROCREAM) 0.75 % cream Apply 1 application topically 2 (two) times daily.    Marland Kitchen omeprazole (PRILOSEC) 40 MG capsule Take 1 capsule (40 mg total) by mouth daily. 90 capsule 3  . oxybutynin (DITROPAN XL) 15 MG 24 hr tablet TAKE ONE TABLET BY MOUTH ONCE DAILY 90 tablet 1  . pravastatin (PRAVACHOL) 40 MG tablet TAKE ONE TABLET BY MOUTH ONCE DAILY 30 tablet 3  . prednisoLONE acetate (OMNIPRED) 1 % ophthalmic suspension One drop in each eye daily     . timolol (BETIMOL) 0.5 % ophthalmic solution Place 1 drop into both eyes at bedtime.     No current facility-administered medications on file prior to visit.    Review of Systems  Constitutional: Positive for appetite change. Negative for fever and fatigue.  HENT: Positive for congestion, ear pain, postnasal drip, rhinorrhea, sinus pressure and sore throat. Negative for nosebleeds.   Eyes: Negative for pain, redness and itching.  Respiratory: Positive for cough. Negative for shortness of breath and wheezing.   Cardiovascular: Negative for chest pain.  Gastrointestinal: Negative for nausea, vomiting, abdominal pain and diarrhea.  Endocrine: Negative for polyuria.  Genitourinary: Negative for dysuria, urgency and frequency.  Musculoskeletal: Negative for myalgias and arthralgias.  Allergic/Immunologic: Negative for immunocompromised state.  Neurological: Positive for headaches. Negative for  dizziness, tremors, syncope, weakness and numbness.  Hematological: Negative for adenopathy. Does not bruise/bleed easily.  Psychiatric/Behavioral: Negative for dysphoric mood. The patient is not nervous/anxious.        Objective:   Physical Exam  Constitutional: She appears well-developed and well-nourished. No distress.  HENT:  Head: Normocephalic and atraumatic.  Right Ear: External ear normal.  Left Ear: External ear normal.  Mouth/Throat: Oropharynx is clear and moist. No oropharyngeal exudate.  Nares are injected and congested  Bilateral maxillary sinus tenderness  Post nasal drip   Eyes: Conjunctivae and EOM are normal. Pupils are equal, round, and reactive to light. Right eye exhibits no discharge. Left eye exhibits no discharge.  Neck: Normal range of motion. Neck supple.  Cardiovascular: Normal rate and regular rhythm.   Pulmonary/Chest: Effort normal and breath sounds normal. No respiratory distress. She has no wheezes. She has no rales.  Lymphadenopathy:    She has no cervical adenopathy.  Neurological: She is alert. No cranial nerve deficit.  Skin: Skin is warm and dry. No rash noted.  Psychiatric: She has a normal mood and affect.          Assessment & Plan:   Problem List Items Addressed This Visit      Respiratory   Acute sinusitis - Primary    Cover with augmentin I think you have a sinus infection  Take augmentin as directed  Drink fluids Breathe steam  Try netti pot  Continue flonase   Update if not starting to improve in a week or if worsening        Relevant Medications   amoxicillin-clavulanate (AUGMENTIN) 875-125 MG tablet   fluticasone (FLONASE) 50 MCG/ACT nasal spray    Other Visit Diagnoses    Upper respiratory tract infection        Relevant Medications    fluticasone (FLONASE) 50 MCG/ACT nasal spray

## 2015-05-15 NOTE — Progress Notes (Signed)
Pre visit review using our clinic review tool, if applicable. No additional management support is needed unless otherwise documented below in the visit note. 

## 2015-05-15 NOTE — Patient Instructions (Signed)
I think you have a sinus infection  Take augmentin as directed  Drink fluids Breathe steam  Try netti pot  Continue flonase   Update if not starting to improve in a week or if worsening

## 2015-05-17 NOTE — Assessment & Plan Note (Signed)
Cover with augmentin I think you have a sinus infection  Take augmentin as directed  Drink fluids Breathe steam  Try netti pot  Continue flonase   Update if not starting to improve in a week or if worsening

## 2015-06-26 ENCOUNTER — Ambulatory Visit (INDEPENDENT_AMBULATORY_CARE_PROVIDER_SITE_OTHER): Payer: Medicare Other | Admitting: Family Medicine

## 2015-06-26 ENCOUNTER — Encounter: Payer: Self-pay | Admitting: Family Medicine

## 2015-06-26 VITALS — BP 104/66 | HR 78 | Temp 98.5°F | Ht 60.25 in | Wt 119.8 lb

## 2015-06-26 DIAGNOSIS — J0100 Acute maxillary sinusitis, unspecified: Secondary | ICD-10-CM

## 2015-06-26 MED ORDER — AMOXICILLIN-POT CLAVULANATE 875-125 MG PO TABS: 1.0000 | ORAL_TABLET | Freq: Two times a day (BID) | ORAL | Status: DC

## 2015-06-26 MED ORDER — BENZONATATE 200 MG PO CAPS: 200.0000 mg | ORAL_CAPSULE | Freq: Two times a day (BID) | ORAL | Status: DC | PRN

## 2015-06-26 NOTE — Progress Notes (Signed)
Patient ID: Victoria Allison, female   DOB: 1940-06-08, 75 y.o.   MRN: [id number]  Tommi Rumps, MD Phone: [phone]  Victoria Allison is a 75 y.o. female who presents today for same-day visit.  Patient notes 2+ weeks of thick yellow mucus from her nose. Notes maxillary and frontal sinus pressure and congestion. Notes some cough that is mildly productive. No fevers. No shortness of breath. No ear pain. No chest pain. Has not been taking any medications for this. Does note frequent history of sinus infections.  PMH: Former smoker   ROS see history of present illness  Objective  Physical Exam Filed Vitals:   06/26/15 1607  BP: 104/66  Pulse: 78  Temp: 98.5 F (36.9 C)    BP Readings from Last 3 Encounters:  06/26/15 104/66  05/15/15 122/68  07/07/14 122/74   Wt Readings from Last 3 Encounters:  06/26/15 119 lb 12.8 oz (54.341 kg)  05/15/15 119 lb 8 oz (54.205 kg)  07/07/14 117 lb 4 oz (53.184 kg)    Physical Exam  Constitutional: She is well-developed, well-nourished, and in no distress.  HENT:  Head: Normocephalic and atraumatic.  Right Ear: External ear normal.  Left Ear: External ear normal.  Normal TMs bilaterally, postnasal drip noted in posterior pharynx, no tonsillar swelling, maxillary sinuses tender to percussion  Eyes: Conjunctivae are normal. Pupils are equal, round, and reactive to light.  Neck: Neck supple.  Cardiovascular: Normal rate, regular rhythm and normal heart sounds.   Pulmonary/Chest: Effort normal and breath sounds normal.  Lymphadenopathy:    She has no cervical adenopathy.  Neurological: She is alert. Gait normal.  Skin: Skin is warm and dry. She is not diaphoretic.     Assessment/Plan: Please see individual problem list.  Acute sinusitis Symptoms most consistent with bacterial sinusitis. Vital signs are stable. Benign lung exam. We'll treat with Augmentin for 10 days. Tessalon for cough. Given return precautions.    No orders of  the defined types were placed in this encounter.    Meds ordered this encounter  Medications  . amoxicillin-clavulanate (AUGMENTIN) 875-125 MG tablet    Sig: Take 1 tablet by mouth 2 (two) times daily.    Dispense:  20 tablet    Refill:  0  . benzonatate (TESSALON) 200 MG capsule    Sig: Take 1 capsule (200 mg total) by mouth 2 (two) times daily as needed for cough.    Dispense:  20 capsule    Refill:  0    Tommi Rumps, MD [place]

## 2015-06-26 NOTE — Patient Instructions (Signed)
Nice to meet you. You appear to have a sinus infection. We will start you on Augmentin to treat this. You should take a probiotic or eat yogurt while on the antibiotic. You can use the Tessalon for cough. If you develop chest pain, shortness of breath, productive of blood, fevers, or any new or changing symptoms please seek medical attention.

## 2015-06-26 NOTE — Assessment & Plan Note (Signed)
Symptoms most consistent with bacterial sinusitis. Vital signs are stable. Benign lung exam. We'll treat with Augmentin for 10 days. Tessalon for cough. Given return precautions.

## 2015-07-06 ENCOUNTER — Other Ambulatory Visit: Payer: Self-pay | Admitting: Family Medicine

## 2015-07-07 ENCOUNTER — Ambulatory Visit (INDEPENDENT_AMBULATORY_CARE_PROVIDER_SITE_OTHER): Payer: Medicare Other

## 2015-07-07 VITALS — BP 110/70 | HR 65 | Temp 97.7°F | Ht 60.0 in | Wt 119.2 lb

## 2015-07-07 DIAGNOSIS — Z1239 Encounter for other screening for malignant neoplasm of breast: Secondary | ICD-10-CM

## 2015-07-07 DIAGNOSIS — Z0000 Encounter for general adult medical examination without abnormal findings: Secondary | ICD-10-CM | POA: Diagnosis not present

## 2015-07-07 NOTE — Progress Notes (Signed)
   Subjective:    Patient ID: Victoria Allison, female    DOB: 23-Apr-1940, 75 y.o.   MRN: [id number]  HPI    Review of Systems     Objective:   Physical Exam        Assessment & Plan:  I reviewed health advisor's note, was available for consultation, and agree with documentation and plan.

## 2015-07-07 NOTE — Progress Notes (Signed)
Subjective:   Victoria Allison is a 75 y.o. female who presents for Medicare Annual (Subsequent) preventive examination.  Review of Systems:  N/A Cardiac Risk Factors include: advanced age (>62men, >68 women);dyslipidemia     Objective:     Vitals: BP 110/70 mmHg  Pulse 65  Temp(Src) 97.7 F (36.5 C)  Ht 5' (1.524 m)  Wt 119 lb 4 oz (54.091 kg)  BMI 23.29 kg/m2  SpO2 98%  LMP 02/29/1968  Body mass index is 23.29 kg/(m^2).   Tobacco History  Smoking status  . Former Smoker  . Quit date: 02/29/1968  Smokeless tobacco  . Never Used     Counseling given: No   Past Medical History  Diagnosis Date  . Allergic rhinitis   . GERD (gastroesophageal reflux disease)   . CAD (coronary artery disease)   . Hyperlipemia   . Hypertension   . Urinary incontinence     OVERACTIVE BLADDER  . Lichen sclerosus 123456    (DX BY GYN AFTER ABN PAP)  . Pemphigoid, cicatricial     D/O AFFECTING EYES, MOUTH, THROAT NOW (DX [facility])  . Elevated LFTs   . History of hepatitis     unsure of type  . Colon polyp   . Diverticulosis     sigmoid and descending colon  . Internal hemorrhoids   . Chronic kidney disease     overactive bladder  . Esophageal ring   . DM (diabetes mellitus) ([place])     no longer take any diabetic meds now that off Prednisone  . Shingles 03/13/2013   Past Surgical History  Procedure Laterality Date  . Oophorectomy    . Angioplasty      LAD  . Nose surgery      Biopsy  . Cataract extraction    . Cardiac electrophysiology mapping and ablation    . Colonoscopy    . Abdominal hysterectomy    . Esophagogastroduodenoscopy  09/14/2011    Procedure: ESOPHAGOGASTRODUODENOSCOPY (EGD);  Surgeon: Gatha Mayer, MD;  Location: [facility];  Service: Endoscopy;  Laterality: N/A;  needs xray  . Savory dilation  09/14/2011    Procedure: SAVORY DILATION;  Surgeon: Gatha Mayer, MD;  Location: [facility];  Service: Endoscopy;  Laterality: N/A;  .  Esophagogastroduodenoscopy N/A 01/02/2013    Procedure: ESOPHAGOGASTRODUODENOSCOPY (EGD);  Surgeon: Gatha Mayer, MD;  Location: [facility];  Service: Endoscopy;  Laterality: N/A;  Venia Minks dilation N/A 01/02/2013    Procedure: Venia Minks DILATION;  Surgeon: Gatha Mayer, MD;  Location: [facility];  Service: Endoscopy;  Laterality: N/A;  . Savory dilation N/A 01/02/2013    Procedure: SAVORY DILATION;  Surgeon: Gatha Mayer, MD;  Location: [facility];  Service: Endoscopy;  Laterality: N/A;  . Esophagogastroduodenoscopy N/A 04/12/2013    Procedure: ESOPHAGOGASTRODUODENOSCOPY (EGD);  Surgeon: Gatha Mayer, MD;  Location: [facility];  Service: Endoscopy;  Laterality: N/A;  . Savory dilation N/A 04/12/2013    Procedure: SAVORY DILATION;  Surgeon: Gatha Mayer, MD;  Location: [facility];  Service: Endoscopy;  Laterality: N/A;   Family History  Problem Relation Age of Onset  . Stroke Mother     CVA  . Alzheimer's disease Mother   . Lung cancer Father   . Hypertension Brother   . Esophageal cancer Son 47  . Colon cancer Neg Hx   . Rectal cancer Neg Hx   . Stomach cancer Son 37   History  Sexual Activity  . Sexual Activity: No  Outpatient Encounter Prescriptions as of 07/07/2015  Medication Sig  . acetaminophen (TYLENOL) 325 MG tablet Take 325 mg by mouth. OTC as directed   . amoxicillin-clavulanate (AUGMENTIN) 875-125 MG tablet Take 1 tablet by mouth 2 (two) times daily.  . benzonatate (TESSALON) 200 MG capsule Take 1 capsule (200 mg total) by mouth 2 (two) times daily as needed for cough.  . fluticasone (FLONASE) 50 MCG/ACT nasal spray Place 2 sprays into both nostrils daily.  Marland Kitchen lisinopril (PRINIVIL,ZESTRIL) 10 MG tablet Take 1 tablet (10 mg total) by mouth daily.  . metroNIDAZOLE (METROCREAM) 0.75 % cream Apply 1 application topically 2 (two) times daily.  Marland Kitchen omeprazole (PRILOSEC) 40 MG capsule TAKE ONE CAPSULE BY MOUTH ONCE DAILY  . oxybutynin (DITROPAN XL) 15 MG 24 hr  tablet TAKE ONE TABLET BY MOUTH ONCE DAILY  . pravastatin (PRAVACHOL) 40 MG tablet TAKE ONE TABLET BY MOUTH ONCE DAILY  . prednisoLONE acetate (OMNIPRED) 1 % ophthalmic suspension One drop in each eye daily   . timolol (BETIMOL) 0.5 % ophthalmic solution Place 1 drop into both eyes at bedtime.   No facility-administered encounter medications on file as of 07/07/2015.    Activities of Daily Living In your present state of health, do you have any difficulty performing the following activities: 07/07/2015  Hearing? Y  Vision? N  Difficulty concentrating or making decisions? N  Walking or climbing stairs? N  Dressing or bathing? N  Doing errands, shopping? N  Preparing Food and eating ? N  Using the Toilet? N  In the past six months, have you accidently leaked urine? Y  Do you have problems with loss of bowel control? N  Managing your Medications? N  Managing your Finances? N  Housekeeping or managing your Housekeeping? N    Patient Care Team: Abner Greenspan, MD as PCP - General Estill Cotta, MD as Consulting Physician (Ophthalmology) Gatha Mayer, MD as Consulting Physician (Gastroenterology) Baxter Kail, MD as Consulting Physician (Dermatology)    Assessment:     Hearing Screening Comments: Bilateral hearing aids Vision Screening Comments: Last eye exam in Nov 2016  Exercise Activities and Dietary recommendations Current Exercise Habits: The patient does not participate in regular exercise at present (pt walks frequently in home doing housework and outside doing yardwork), Exercise limited by: None identified  Goals    . Eat more fruits and vegetables     Starting 07/07/15, I will increase my intake of fresh fruits and vegetables to at least 4 servings daily.       Fall Risk Fall Risk  07/07/2015 12/13/2013 12/10/2012  Falls in the past year? No No No   Depression Screen PHQ 2/9 Scores 07/07/2015 12/13/2013 12/10/2012  PHQ - 2 Score 0 0 0     Cognitive Testing MMSE -  Mini Mental State Exam 07/07/2015  Orientation to time 5  Orientation to Place 5  Registration 3  Attention/ Calculation 0  Recall 3  Language- name 2 objects 0  Language- repeat 1  Language- follow 3 step command 3  Language- read & follow direction 0  Write a sentence 0  Copy design 0  Total score 20   PLEASE NOTE: A Mini-Cog screen was completed. Maximum score is 20. A value of 0 denotes this part of [place] MMSE was not completed or the patient failed this part of the Mini-Cog screening.   Mini-Cog Screening Orientation to Time - Max 5 pts Orientation to Place - Max 5 pts Registration - Max 3  pts Recall - Max 3 pts Language Repeat - Max 1 pts Language Follow 3 Step Command - Max 3 pts  Immunization History  Administered Date(s) Administered  . Influenza Split 12/03/2010  . Influenza Whole 12/23/2008  . Influenza,inj,Quad PF,36+ Mos 12/10/2012, 12/13/2013  . Pneumococcal Conjugate-13 12/13/2013  . Pneumococcal Polysaccharide-23 12/23/2008  . Td 02/28/1998, 06/03/2008   Screening Tests Health Maintenance  Topic Date Due  . MAMMOGRAM  11/29/2015 (Originally 12/09/2011)  . ZOSTAVAX  04/01/2020 (Originally 07/26/1998)  . INFLUENZA VACCINE  09/29/2015  . TETANUS/TDAP  06/04/2018  . DEXA SCAN  Completed  . PNA vac Low Risk Adult  Completed      Plan:     I have personally reviewed and addressed the Medicare Annual Wellness questionnaire and have noted the following in the patient's chart:  A. Medical and social history B. Use of alcohol, tobacco or illicit drugs  C. Current medications and supplements D. Functional ability and status E.  Nutritional status F.  Physical activity G. Advance directives H. List of other physicians I.  Hospitalizations, surgeries, and ER visits in previous 12 months J.  [place] to include hearing, vision, cognitive, depression L. Referrals and appointments - none  In addition, I have reviewed and discussed with patient  certain preventive protocols, quality metrics, and best practice recommendations. A written personalized care plan for preventive services as well as general preventive health recommendations were provided to patient.  See attached scanned questionnaire for additional information.   Signed,   Lindell Noe, MHA, BS, LPN Health Advisor [phone]

## 2015-07-07 NOTE — Progress Notes (Signed)
PCP notes:  Health maintenance: Mammogram order generated. Appt scheduled 07/17/15 @ [facility]. Pt encouraged to complete monthly self-breast examinations.

## 2015-07-07 NOTE — Progress Notes (Signed)
Pre visit review using our clinic review tool, if applicable. No additional management support is needed unless otherwise documented below in the visit note. 

## 2015-07-07 NOTE — Patient Instructions (Signed)
Victoria Allison , Thank you for taking time to come for your Medicare Wellness Visit. I appreciate your ongoing commitment to your health goals. Please review the following plan we discussed and let me know if I can assist you in the future.   These are the goals we discussed: Goals    . Eat more fruits and vegetables     Starting 07/07/15, I will increase my intake of fresh fruits and vegetables to at least 4 servings daily.        This is a list of the screening recommended for you and due dates:  Health Maintenance  Topic Date Due  . Mammogram  04/01/2020*  . Shingles Vaccine  04/01/2020*  . Flu Shot  09/29/2015  . Tetanus Vaccine  06/04/2018  . DEXA scan (bone density measurement)  Completed  . Pneumonia vaccines  Completed  *Topic was postponed. The date shown is not the original due date.   Preventive Care for Adults  A healthy lifestyle and preventive care can promote health and wellness. Preventive health guidelines for adults include the following key practices.  . A routine yearly physical is a good way to check with your health care provider about your health and preventive screening. It is a chance to share any concerns and updates on your health and to receive a thorough exam.  . Visit your dentist for a routine exam and preventive care every 6 months. Brush your teeth twice a day and floss once a day. Good oral hygiene prevents tooth decay and gum disease.  . The frequency of eye exams is based on your age, health, family medical history, use  of contact lenses, and other factors. Follow your health care provider's ecommendations for frequency of eye exams.  . Eat a healthy diet. Foods like vegetables, fruits, whole grains, low-fat dairy products, and lean protein foods contain the nutrients you need without too many calories. Decrease your intake of foods high in solid fats, added sugars, and salt. Eat the right amount of calories for you. Get information about a proper  diet from your health care provider, if necessary.  . Regular physical exercise is one of the most important things you can do for your health. Most adults should get at least 150 minutes of moderate-intensity exercise (any activity that increases your heart rate and causes you to sweat) each week. In addition, most adults need muscle-strengthening exercises on 2 or more days a week.  Silver Sneakers may be a benefit available to you. To determine eligibility, you may visit the website: www.silversneakers.com or contact program at [phone] Mon-Fri between 8AM-8PM.   . Maintain a healthy weight. The body mass index (BMI) is a screening tool to identify possible weight problems. It provides an estimate of body fat based on height and weight. Your health care provider can find your BMI and can help you achieve or maintain a healthy weight.   For adults 20 years and older: ? A BMI below 18.5 is considered underweight. ? A BMI of 18.5 to 24.9 is normal. ? A BMI of 25 to 29.9 is considered overweight. ? A BMI of 30 and above is considered obese.   . Maintain normal blood lipids and cholesterol levels by exercising and minimizing your intake of saturated fat. Eat a balanced diet with plenty of fruit and vegetables. Blood tests for lipids and cholesterol should begin at age 77 and be repeated every 5 years. If your lipid or cholesterol levels are high, you are  over 65, or you are at high risk for heart disease, you may need your cholesterol levels checked more frequently. Ongoing high lipid and cholesterol levels should be treated with medicines if diet and exercise are not working.  . If you smoke, find out from your health care provider how to quit. If you do not use tobacco, please do not start.  . If you choose to drink alcohol, please do not consume more than 2 drinks per day. One drink is considered to be 12 ounces (355 mL) of beer, 5 ounces (148 mL) of wine, or 1.5 ounces (44 mL) of  liquor.  . If you are 53-74 years old, ask your health care provider if you should take aspirin to prevent strokes.  . Use sunscreen. Apply sunscreen liberally and repeatedly throughout the day. You should seek shade when your shadow is shorter than you. Protect yourself by wearing long sleeves, pants, a wide-brimmed hat, and sunglasses year round, whenever you are outdoors.  . Once a month, do a whole body skin exam, using a mirror to look at the skin on your back. Tell your health care provider of new moles, moles that have irregular borders, moles that are larger than a pencil eraser, or moles that have changed in shape or color.

## 2015-07-14 ENCOUNTER — Encounter: Payer: Self-pay | Admitting: Family Medicine

## 2015-07-14 ENCOUNTER — Ambulatory Visit (INDEPENDENT_AMBULATORY_CARE_PROVIDER_SITE_OTHER): Payer: Medicare Other | Admitting: Family Medicine

## 2015-07-14 VITALS — BP 120/80 | HR 62 | Temp 97.9°F | Wt 119.8 lb

## 2015-07-14 DIAGNOSIS — Z8619 Personal history of other infectious and parasitic diseases: Secondary | ICD-10-CM

## 2015-07-14 DIAGNOSIS — K219 Gastro-esophageal reflux disease without esophagitis: Secondary | ICD-10-CM

## 2015-07-14 DIAGNOSIS — J302 Other seasonal allergic rhinitis: Secondary | ICD-10-CM

## 2015-07-14 DIAGNOSIS — E785 Hyperlipidemia, unspecified: Secondary | ICD-10-CM

## 2015-07-14 DIAGNOSIS — I10 Essential (primary) hypertension: Secondary | ICD-10-CM

## 2015-07-14 DIAGNOSIS — R32 Unspecified urinary incontinence: Secondary | ICD-10-CM

## 2015-07-14 DIAGNOSIS — J069 Acute upper respiratory infection, unspecified: Secondary | ICD-10-CM | POA: Diagnosis not present

## 2015-07-14 DIAGNOSIS — L129 Pemphigoid, unspecified: Secondary | ICD-10-CM

## 2015-07-14 DIAGNOSIS — Z1211 Encounter for screening for malignant neoplasm of colon: Secondary | ICD-10-CM

## 2015-07-14 LAB — COMPREHENSIVE METABOLIC PANEL
ALT: 17 U/L (ref 0–35)
AST: 16 U/L (ref 0–37)
Albumin: 3.9 g/dL (ref 3.5–5.2)
Alkaline Phosphatase: 81 U/L (ref 39–117)
BUN: 10 mg/dL (ref 6–23)
CO2: 28 mEq/L (ref 19–32)
Calcium: 9.4 mg/dL (ref 8.4–10.5)
Chloride: 104 mEq/L (ref 96–112)
Creatinine, Ser: 0.63 mg/dL (ref 0.40–1.20)
GFR: 97.4 mL/min (ref >60.00)
Glucose, Bld: 94 mg/dL (ref 70–99)
Potassium: 4.2 mEq/L (ref 3.5–5.1)
Sodium: 139 mEq/L (ref 135–145)
Total Bilirubin: 0.4 mg/dL (ref 0.2–1.2)
Total Protein: 6.7 g/dL (ref 6.0–8.3)

## 2015-07-14 LAB — CBC WITH DIFFERENTIAL/PLATELET
Basophils Absolute: 0 10*3/uL (ref 0.0–0.1)
Basophils Relative: 0.8 % (ref 0.0–3.0)
Eosinophils Absolute: 0.2 10*3/uL (ref 0.0–0.7)
Eosinophils Relative: 2.7 % (ref 0.0–5.0)
HCT: 36.8 % (ref 36.0–46.0)
Hemoglobin: 12.4 g/dL (ref 12.0–15.0)
Lymphocytes Relative: 39.2 % (ref 12.0–46.0)
Lymphs Abs: 2.3 10*3/uL (ref 0.7–4.0)
MCHC: 33.8 g/dL (ref 30.0–36.0)
MCV: 86.8 fl (ref 78.0–100.0)
Monocytes Absolute: 0.4 10*3/uL (ref 0.1–1.0)
Monocytes Relative: 7.4 % (ref 3.0–12.0)
Neutro Abs: 3 10*3/uL (ref 1.4–7.7)
Neutrophils Relative %: 49.9 % (ref 43.0–77.0)
Platelets: 180 10*3/uL (ref 150.0–400.0)
RBC: 4.24 Mil/uL (ref 3.87–5.11)
RDW: 13.8 % (ref 11.5–15.5)
WBC: 5.9 10*3/uL (ref 4.0–10.5)

## 2015-07-14 LAB — TSH: TSH: 3.27 u[IU]/mL (ref 0.35–4.50)

## 2015-07-14 LAB — LIPID PANEL
Cholesterol: 182 mg/dL (ref 0–200)
HDL: 42.9 mg/dL (ref >39.00)
LDL Cholesterol: 112 mg/dL — ABNORMAL HIGH (ref 0–99)
NonHDL: 138.94
Total CHOL/HDL Ratio: 4
Triglycerides: 133 mg/dL (ref 0.0–149.0)
VLDL: 26.6 mg/dL (ref 0.0–40.0)

## 2015-07-14 MED ORDER — LISINOPRIL 10 MG PO TABS: 10.0000 mg | ORAL_TABLET | Freq: Every day | ORAL | Status: DC

## 2015-07-14 MED ORDER — OXYBUTYNIN CHLORIDE ER 15 MG PO TB24: 15.0000 mg | ORAL_TABLET | Freq: Every day | ORAL | Status: DC

## 2015-07-14 MED ORDER — PRAVASTATIN SODIUM 40 MG PO TABS: 40.0000 mg | ORAL_TABLET | Freq: Every day | ORAL | Status: DC

## 2015-07-14 MED ORDER — OMEPRAZOLE 40 MG PO CPDR: 40.0000 mg | DELAYED_RELEASE_CAPSULE | Freq: Every day | ORAL | Status: DC

## 2015-07-14 MED ORDER — FLUTICASONE PROPIONATE 50 MCG/ACT NA SUSP: 2.0000 | Freq: Every day | NASAL | Status: DC

## 2015-07-14 NOTE — Assessment & Plan Note (Signed)
Doing well with omeprazole 40  Has been unable to get off of it- symptoms return in moderate to severe fashion even with time and good diet Will continue this

## 2015-07-14 NOTE — Patient Instructions (Signed)
Labs today Take care of yourself  Eat a balanced diet the best you can (with your mouth problems) Continue flonase for allergies In addition to that -try nasal saline (I like the brand simply saline) over the counter - as needed to help with the post nasal mucous  No change in medicines

## 2015-07-14 NOTE — Progress Notes (Signed)
Subjective:    Patient ID: Victoria Allison, female    DOB: 1940/07/07, 75 y.o.   MRN: [id number]  HPI Here for f/u of chronic health problems /annual exam   Doing ok overall   Reviewed her AMW visit with Katha Cabal - she changed her mind and scheduled a mammogram  Overall a good/reassuring visit   Wt is stable  Taking care of yourself   bp is stable today  No cp or palpitations or headaches or edema  No side effects to medicines  BP Readings from Last 3 Encounters:  07/14/15 120/80  07/07/15 110/70  06/26/15 104/66     On lisinopril   Hyperlipidemia Lab Results  Component Value Date   CHOL 191 12/06/2013   HDL 45.20 12/06/2013   LDLCALC 123* 12/06/2013   LDLDIRECT 184.0 11/25/2010   TRIG 115.0 12/06/2013   CHOLHDL 4 12/06/2013    On pravastatin and diet  Avoids fried and greasy foods for the most part  Some candy at times however  Also CAD-no problems   Getting a mammogram upcoming  Had dexa in 2010 -normal range No falls or fractures    GERD Takes omeprazole 40 mg and it works well   Overactive bladder Takes ditropan  flonase for allergies - still uses that  Has a lot of thick post nasal drip in am   Due for labs today  Patient Active Problem List   Diagnosis Date Noted  . Exposure to influenza 04/14/2015  . Acute sinusitis 07/07/2014  . Cramp in muscle 04/22/2014  . Colon cancer screening 12/13/2013  . Esophageal dysphagia 04/12/2013  . History of shingles 03/13/2013  . Encounter for Medicare annual wellness exam 12/10/2012  . Esophageal rings 08/24/2011  . SUPRAVENTRICULAR TACHYCARDIA 09/22/2009  . ARTHRITIS, SHOULDER 05/21/2009  . INSOMNIA 02/05/2009  . POSTMENOPAUSAL STATUS 06/02/2008  . PEMPHIGOID 11/14/2007  . Nonspecific (abnormal) findings on radiological and other examination of body structure 07/05/2007  . Hyperlipidemia LDL goal <100 03/13/2007  . Essential hypertension 03/13/2007  . CORONARY ARTERY DISEASE 03/13/2007  . ALLERGIC  RHINITIS 03/13/2007  . GERD 03/13/2007  . HAIR LOSS 03/13/2007  . LEG PAIN, CHRONIC 03/13/2007  . URINARY INCONTINENCE 03/13/2007  . COLONIC POLYPS, HX OF 03/13/2007  . HX, URINARY INFECTION 08/14/2006   Past Medical History  Diagnosis Date  . Allergic rhinitis   . GERD (gastroesophageal reflux disease)   . CAD (coronary artery disease)   . Hyperlipemia   . Hypertension   . Urinary incontinence     OVERACTIVE BLADDER  . Lichen sclerosus 123456    (DX BY GYN AFTER ABN PAP)  . Pemphigoid, cicatricial     D/O AFFECTING EYES, MOUTH, THROAT NOW (DX [facility])  . Elevated LFTs   . History of hepatitis     unsure of type  . Colon polyp   . Diverticulosis     sigmoid and descending colon  . Internal hemorrhoids   . Chronic kidney disease     overactive bladder  . Esophageal ring   . DM (diabetes mellitus) ([place])     no longer take any diabetic meds now that off Prednisone  . Shingles 03/13/2013   Past Surgical History  Procedure Laterality Date  . Oophorectomy    . Angioplasty      LAD  . Nose surgery      Biopsy  . Cataract extraction    . Cardiac electrophysiology mapping and ablation    . Colonoscopy    . Abdominal  hysterectomy    . Esophagogastroduodenoscopy  09/14/2011    Procedure: ESOPHAGOGASTRODUODENOSCOPY (EGD);  Surgeon: Gatha Mayer, MD;  Location: [facility];  Service: Endoscopy;  Laterality: N/A;  needs xray  . Savory dilation  09/14/2011    Procedure: SAVORY DILATION;  Surgeon: Gatha Mayer, MD;  Location: [facility];  Service: Endoscopy;  Laterality: N/A;  . Esophagogastroduodenoscopy N/A 01/02/2013    Procedure: ESOPHAGOGASTRODUODENOSCOPY (EGD);  Surgeon: Gatha Mayer, MD;  Location: [facility];  Service: Endoscopy;  Laterality: N/A;  Venia Minks dilation N/A 01/02/2013    Procedure: Venia Minks DILATION;  Surgeon: Gatha Mayer, MD;  Location: [facility];  Service: Endoscopy;  Laterality: N/A;  . Savory dilation N/A 01/02/2013    Procedure: SAVORY DILATION;   Surgeon: Gatha Mayer, MD;  Location: [facility];  Service: Endoscopy;  Laterality: N/A;  . Esophagogastroduodenoscopy N/A 04/12/2013    Procedure: ESOPHAGOGASTRODUODENOSCOPY (EGD);  Surgeon: Gatha Mayer, MD;  Location: [facility];  Service: Endoscopy;  Laterality: N/A;  . Savory dilation N/A 04/12/2013    Procedure: SAVORY DILATION;  Surgeon: Gatha Mayer, MD;  Location: [facility];  Service: Endoscopy;  Laterality: N/A;   Social History  Substance Use Topics  . Smoking status: Former Smoker    Quit date: 02/29/1968  . Smokeless tobacco: Never Used  . Alcohol Use: No   Family History  Problem Relation Age of Onset  . Stroke Mother     CVA  . Alzheimer's disease Mother   . Lung cancer Father   . Hypertension Brother   . Esophageal cancer Son 30  . Colon cancer Neg Hx   . Rectal cancer Neg Hx   . Stomach cancer Son 57   Allergies  Allergen Reactions  . Aspirin     REACTION: bleeds too easily-  . Lipitor [Atorvastatin]     REACTION: elevated LFT's  . Tetanus Toxoid     REACTION: rash  . Ultram [Tramadol]     dizzy   Current Outpatient Prescriptions on File Prior to Visit  Medication Sig Dispense Refill  . acetaminophen (TYLENOL) 325 MG tablet Take 325 mg by mouth. OTC as directed     . metroNIDAZOLE (METROCREAM) 0.75 % cream Apply 1 application topically 2 (two) times daily.    . prednisoLONE acetate (OMNIPRED) 1 % ophthalmic suspension One drop in each eye daily     . timolol (BETIMOL) 0.5 % ophthalmic solution Place 1 drop into both eyes at bedtime.     No current facility-administered medications on file prior to visit.    Review of Systems    Review of Systems  Constitutional: Negative for fever, appetite change, fatigue and unexpected weight change.  Eyes: Negative for pain and visual disturbance.  ENT pos for pnd from allergies, pos for mouth soreness and gum loss from pemphigoid (baseline) Respiratory: Negative for cough and shortness of breath.     Cardiovascular: Negative for cp or palpitations    Gastrointestinal: Negative for nausea, diarrhea and constipation.  Genitourinary: Negative for urgency and pos for frequency (baseline) helped with ditropan Skin: Negative for pallor or rash   Neurological: Negative for weakness, light-headedness, numbness and headaches.  Hematological: Negative for adenopathy. Does not bruise/bleed easily.  Psychiatric/Behavioral: Negative for dysphoric mood. The patient is not nervous/anxious.      Objective:   Physical Exam  Constitutional: She appears well-developed and well-nourished. No distress.  Well appearing elderly female  HENT:  Head: Normocephalic and atraumatic.  Right Ear: External ear normal.  Left Ear: External ear normal.  Nose: Nose normal.  Mouth/Throat: Oropharynx is clear and moist.  Gum atrophy with dentures that easily slip out  Scant cerumen bilat   Mild clear pnd  Eyes: Conjunctivae and EOM are normal. Pupils are equal, round, and reactive to light. Right eye exhibits no discharge. Left eye exhibits no discharge. No scleral icterus.  Neck: Normal range of motion. Neck supple. No JVD present. Carotid bruit is not present. No thyromegaly present.  Cardiovascular: Normal rate, regular rhythm, normal heart sounds and intact distal pulses.  Exam reveals no gallop.   Pulmonary/Chest: Effort normal and breath sounds normal. No respiratory distress. She has no wheezes. She has no rales.  Abdominal: Soft. Bowel sounds are normal. She exhibits no distension and no mass. There is no tenderness.  Musculoskeletal: She exhibits no edema or tenderness.  Lymphadenopathy:    She has no cervical adenopathy.  Neurological: She is alert. She has normal reflexes. No cranial nerve deficit. She exhibits normal muscle tone. Coordination normal.  Skin: Skin is warm and dry. No rash noted. No erythema. No pallor.  Psychiatric: She has a normal mood and affect.          Assessment & Plan:    Problem List Items Addressed This Visit      Cardiovascular and Mediastinum   Essential hypertension - Primary    bp in fair control at this time  BP Readings from Last 1 Encounters:  07/14/15 120/80   No changes needed Disc lifstyle change with low sodium diet and exercise  Labs today  Refilled lisinopril       Relevant Medications   pravastatin (PRAVACHOL) 40 MG tablet   lisinopril (PRINIVIL,ZESTRIL) 10 MG tablet   Other Relevant Orders   CBC with Differential/Platelet   Comprehensive metabolic panel   TSH   Lipid panel     Digestive   GERD    Doing well with omeprazole 40  Has been unable to get off of it- symptoms return in moderate to severe fashion even with time and good diet Will continue this        Relevant Medications   omeprazole (PRILOSEC) 40 MG capsule     Musculoskeletal and Integument   PEMPHIGOID    Currently very little gum tissue left -she wears dentures and has difficulty keeping them in  Eats soft foods with caution  No longer on medication at this time        Other   URINARY INCONTINENCE    Pt continues ditropan xl which is helpful       Relevant Medications   oxybutynin (DITROPAN XL) 15 MG 24 hr tablet   Hyperlipidemia LDL goal <100    Labs today on pravastatin and diet  Disc goals for lipids and reasons to control them Rev labs with pt (from last check) Rev low sat fat diet in detail       Relevant Medications   pravastatin (PRAVACHOL) 40 MG tablet   lisinopril (PRINIVIL,ZESTRIL) 10 MG tablet   Other Relevant Orders   Lipid panel   History of shingles   Colon cancer screening    Other Visit Diagnoses    Upper respiratory tract infection        Relevant Medications    fluticasone (FLONASE) 50 MCG/ACT nasal spray

## 2015-07-14 NOTE — Assessment & Plan Note (Signed)
bp in fair control at this time  BP Readings from Last 1 Encounters:  07/14/15 120/80   No changes needed Disc lifstyle change with low sodium diet and exercise  Labs today  Refilled lisinopril

## 2015-07-14 NOTE — Assessment & Plan Note (Signed)
Pt continues ditropan xl which is helpful

## 2015-07-14 NOTE — Assessment & Plan Note (Signed)
Continues flonase Suggest use of nasal saline spray bid to help with thick mucous in throat and pnd

## 2015-07-14 NOTE — Progress Notes (Signed)
Pre visit review using our clinic review tool, if applicable. No additional management support is needed unless otherwise documented below in the visit note. 

## 2015-07-14 NOTE — Assessment & Plan Note (Signed)
Labs today on pravastatin and diet  Disc goals for lipids and reasons to control them Rev labs with pt (from last check) Rev low sat fat diet in detail

## 2015-07-14 NOTE — Assessment & Plan Note (Signed)
Currently very little gum tissue left -she wears dentures and has difficulty keeping them in  Eats soft foods with caution  No longer on medication at this time

## 2015-07-15 DIAGNOSIS — H40003 Preglaucoma, unspecified, bilateral: Secondary | ICD-10-CM | POA: Diagnosis not present

## 2015-07-17 ENCOUNTER — Other Ambulatory Visit: Payer: Self-pay | Admitting: Family Medicine

## 2015-07-17 ENCOUNTER — Ambulatory Visit
Admission: RE | Admit: 2015-07-17 | Discharge: 2015-07-17 | Disposition: A | Discharge status: Home / Self Care | Payer: Medicare Other | Source: Ambulatory Visit | Attending: Family Medicine | Admitting: Family Medicine

## 2015-07-17 DIAGNOSIS — Z1231 Encounter for screening mammogram for malignant neoplasm of breast: Secondary | ICD-10-CM | POA: Insufficient documentation

## 2015-07-17 DIAGNOSIS — Z1239 Encounter for other screening for malignant neoplasm of breast: Secondary | ICD-10-CM

## 2015-07-17 HISTORY — DX: Malignant (primary) neoplasm, unspecified: C80.1

## 2015-07-17 LAB — HM MAMMOGRAPHY

## 2015-07-20 ENCOUNTER — Encounter: Payer: Self-pay | Admitting: *Deleted

## 2015-07-22 DIAGNOSIS — H40053 Ocular hypertension, bilateral: Secondary | ICD-10-CM | POA: Diagnosis not present

## 2015-10-26 DIAGNOSIS — Z85828 Personal history of other malignant neoplasm of skin: Secondary | ICD-10-CM | POA: Diagnosis not present

## 2015-10-26 DIAGNOSIS — L570 Actinic keratosis: Secondary | ICD-10-CM | POA: Diagnosis not present

## 2015-10-26 DIAGNOSIS — L309 Dermatitis, unspecified: Secondary | ICD-10-CM | POA: Diagnosis not present

## 2015-10-26 DIAGNOSIS — D485 Neoplasm of uncertain behavior of skin: Secondary | ICD-10-CM | POA: Diagnosis not present

## 2015-10-26 DIAGNOSIS — C4402 Squamous cell carcinoma of skin of lip: Secondary | ICD-10-CM | POA: Diagnosis not present

## 2015-10-26 DIAGNOSIS — L121 Cicatricial pemphigoid: Secondary | ICD-10-CM | POA: Diagnosis not present

## 2015-12-09 DIAGNOSIS — L814 Other melanin hyperpigmentation: Secondary | ICD-10-CM | POA: Diagnosis not present

## 2015-12-09 DIAGNOSIS — L578 Other skin changes due to chronic exposure to nonionizing radiation: Secondary | ICD-10-CM | POA: Diagnosis not present

## 2015-12-09 DIAGNOSIS — L908 Other atrophic disorders of skin: Secondary | ICD-10-CM | POA: Diagnosis not present

## 2015-12-09 DIAGNOSIS — C4402 Squamous cell carcinoma of skin of lip: Secondary | ICD-10-CM | POA: Diagnosis not present

## 2015-12-09 DIAGNOSIS — D492 Neoplasm of unspecified behavior of bone, soft tissue, and skin: Secondary | ICD-10-CM | POA: Diagnosis not present

## 2016-01-27 DIAGNOSIS — Z85828 Personal history of other malignant neoplasm of skin: Secondary | ICD-10-CM | POA: Diagnosis not present

## 2016-01-27 DIAGNOSIS — L570 Actinic keratosis: Secondary | ICD-10-CM | POA: Diagnosis not present

## 2016-02-02 DIAGNOSIS — L121 Cicatricial pemphigoid: Secondary | ICD-10-CM | POA: Diagnosis not present

## 2016-02-10 DIAGNOSIS — H43819 Vitreous degeneration, unspecified eye: Secondary | ICD-10-CM | POA: Diagnosis not present

## 2016-02-10 DIAGNOSIS — H43811 Vitreous degeneration, right eye: Secondary | ICD-10-CM | POA: Diagnosis not present

## 2016-02-16 DIAGNOSIS — L121 Cicatricial pemphigoid: Secondary | ICD-10-CM | POA: Diagnosis not present

## 2016-02-29 HISTORY — PX: OTHER SURGICAL HISTORY: SHX169

## 2016-03-15 DIAGNOSIS — L121 Cicatricial pemphigoid: Secondary | ICD-10-CM | POA: Diagnosis not present

## 2016-04-07 ENCOUNTER — Encounter: Payer: Self-pay | Admitting: Family Medicine

## 2016-04-07 ENCOUNTER — Ambulatory Visit (INDEPENDENT_AMBULATORY_CARE_PROVIDER_SITE_OTHER): Payer: Medicare Other | Admitting: Family Medicine

## 2016-04-07 DIAGNOSIS — J0100 Acute maxillary sinusitis, unspecified: Secondary | ICD-10-CM | POA: Diagnosis not present

## 2016-04-07 MED ORDER — AMOXICILLIN-POT CLAVULANATE 875-125 MG PO TABS: 1.0000 | ORAL_TABLET | Freq: Two times a day (BID) | ORAL | 0 refills | Status: DC

## 2016-04-07 MED ORDER — BENZONATATE 200 MG PO CAPS: 200.0000 mg | ORAL_CAPSULE | Freq: Three times a day (TID) | ORAL | 0 refills | Status: DC | PRN

## 2016-04-07 NOTE — Patient Instructions (Signed)
Start augmentin, take tessalon for cough.  Update [place] as needed.   Rest and fluids in the meantime.  Take care.  Glad to see you.

## 2016-04-07 NOTE — Assessment & Plan Note (Signed)
Nontoxic, augmentin, tessalon, rest and fluids, see AVS.  She agrees.

## 2016-04-07 NOTE — Progress Notes (Signed)
Husband was recently sick.   Sx started about 2 weeks ago.  She has no fevers.  Cough.  Sputum.  No aches.  Post nasal gtt.  Some HA, likely cough related.  Some facial pain.  No vomiting, no diarrhea.  No ear pain.  Hard of hearing at baseline.    She has pemphigoid hx noted.    Meds, vitals, and allergies reviewed.   ROS: Per HPI unless specifically indicated in ROS section   GEN: nad, alert and oriented HEENT: mucous membranes moist, tm w/o erythema, nasal exam w/o erythema, clear discharge noted,  OP with cobblestoning NECK: supple w/o LA, max sinuses ttp B CV: rrr.   PULM: ctab, no inc wob EXT: no edema SKIN: no acute rash

## 2016-04-07 NOTE — Progress Notes (Signed)
Pre visit review using our clinic review tool, if applicable. No additional management support is needed unless otherwise documented below in the visit note. 

## 2016-04-08 ENCOUNTER — Ambulatory Visit: Payer: Medicare Other | Admitting: Family Medicine

## 2016-05-17 ENCOUNTER — Ambulatory Visit (INDEPENDENT_AMBULATORY_CARE_PROVIDER_SITE_OTHER): Payer: Medicare Other | Admitting: Internal Medicine

## 2016-05-17 ENCOUNTER — Encounter: Payer: Self-pay | Admitting: Internal Medicine

## 2016-05-17 ENCOUNTER — Encounter (INDEPENDENT_AMBULATORY_CARE_PROVIDER_SITE_OTHER): Payer: Self-pay

## 2016-05-17 VITALS — BP 138/56 | HR 72 | Ht 60.0 in | Wt 119.5 lb

## 2016-05-17 DIAGNOSIS — L129 Pemphigoid, unspecified: Secondary | ICD-10-CM

## 2016-05-17 DIAGNOSIS — K222 Esophageal obstruction: Secondary | ICD-10-CM

## 2016-05-17 DIAGNOSIS — R1310 Dysphagia, unspecified: Secondary | ICD-10-CM | POA: Diagnosis not present

## 2016-05-17 DIAGNOSIS — R1319 Other dysphagia: Secondary | ICD-10-CM

## 2016-05-17 NOTE — Progress Notes (Signed)
Victoria Allison 77 y.o. 03/31/1940 [id number]  Assessment & Plan:   Encounter Diagnoses  Name Primary?  . Esophageal dysphagia Yes  . Esophageal rings, acquired   . Odynophagia   . Pemphigoid     Sounds like rings are a problem again. Not sure why sehe is having the odynophagia features. EGD to evaluate and plans to dilate under fluoro again - Maloney dilation likely  The risks and benefits as well as alternatives of endoscopic procedure(s) have been discussed and reviewed. All questions answered. The patient agrees to proceed.  I appreciate the opportunity to care for her.  ZE:SPQZR Tower, MD   Subjective:   Chief Complaint:Swallowing problems  HPI The patient is a delightful 76 year old married white one with a history of pemphigoid, and multiple rings of the esophagus causing dysphagia. She has had several esophageal dilations but none since 2015. In the past 2 months she has noted that she is having recurrent solid food dysphagia and also some painful swallowing. The painful swallowing and chest burning is a new feature. She is maintained on a PPI. She still uses dentures but there are little looser because she is having some gum recession. She is now carefully cutting her food and chewing well and for the most part is okay but is asking for help with the dysphagia again.  Wt Readings from Last 3 Encounters:  05/17/16 54.2 kg (119 lb 8 oz)  04/07/16 53.5 kg (118 lb)  07/14/15 54.3 kg (119 lb 12.8 oz)   Allergies  Allergen Reactions  . Aspirin     REACTION: bleeds too easily-  . Lipitor [Atorvastatin]     REACTION: elevated LFT's  . Tetanus Toxoid     REACTION: rash  . Ultram [Tramadol]     dizzy   Current Meds  Medication Sig  . acetaminophen (TYLENOL) 325 MG tablet Take 325 mg by mouth. OTC as directed   . brimonidine (ALPHAGAN) 0.15 % ophthalmic solution   . fluticasone (FLONASE) 50 MCG/ACT nasal spray Place 2 sprays into both nostrils daily.  Marland Kitchen  lisinopril (PRINIVIL,ZESTRIL) 10 MG tablet Take 1 tablet (10 mg total) by mouth daily.  Marland Kitchen omeprazole (PRILOSEC) 40 MG capsule Take 1 capsule (40 mg total) by mouth daily.  Marland Kitchen oxybutynin (DITROPAN XL) 15 MG 24 hr tablet Take 1 tablet (15 mg total) by mouth daily.  . pravastatin (PRAVACHOL) 40 MG tablet Take 1 tablet (40 mg total) by mouth daily.  . prednisoLONE acetate (OMNIPRED) 1 % ophthalmic suspension One drop in each eye daily   . timolol (BETIMOL) 0.5 % ophthalmic solution Place 1 drop into both eyes 2 (two) times daily.    Past Medical History:  Diagnosis Date  . Allergic rhinitis   . CAD (coronary artery disease)   . Cancer ([place])    skin  . Chronic kidney disease    overactive bladder  . Colon polyp   . Diverticulosis    sigmoid and descending colon  . DM (diabetes mellitus) ([place])    no longer take any diabetic meds now that off Prednisone  . Elevated LFTs   . Esophageal ring   . GERD (gastroesophageal reflux disease)   . History of hepatitis    unsure of type  . Hyperlipemia   . Hypertension   . Internal hemorrhoids   . Lichen sclerosus 00/76   (DX BY GYN AFTER ABN PAP)  . Pemphigoid, cicatricial    D/O AFFECTING EYES, MOUTH, THROAT NOW (DX [facility])  . Shingles  03/13/2013  . Urinary incontinence    OVERACTIVE BLADDER   Past Surgical History:  Procedure Laterality Date  . ABDOMINAL HYSTERECTOMY    . ANGIOPLASTY     LAD  . CARDIAC ELECTROPHYSIOLOGY MAPPING AND ABLATION    . CATARACT EXTRACTION    . COLONOSCOPY    . ESOPHAGOGASTRODUODENOSCOPY  09/14/2011   Procedure: ESOPHAGOGASTRODUODENOSCOPY (EGD);  Surgeon: Gatha Mayer, MD;  Location: [facility];  Service: Endoscopy;  Laterality: N/A;  needs xray  . ESOPHAGOGASTRODUODENOSCOPY N/A 01/02/2013   Procedure: ESOPHAGOGASTRODUODENOSCOPY (EGD);  Surgeon: Gatha Mayer, MD;  Location: [facility];  Service: Endoscopy;  Laterality: N/A;  . ESOPHAGOGASTRODUODENOSCOPY N/A 04/12/2013   Procedure: ESOPHAGOGASTRODUODENOSCOPY  (EGD);  Surgeon: Gatha Mayer, MD;  Location: [facility];  Service: Endoscopy;  Laterality: N/A;  . Venia Minks DILATION N/A 01/02/2013   Procedure: Venia Minks DILATION;  Surgeon: Gatha Mayer, MD;  Location: [facility];  Service: Endoscopy;  Laterality: N/A;  . NOSE SURGERY     Biopsy  . OOPHORECTOMY    . SAVORY DILATION  09/14/2011   Procedure: SAVORY DILATION;  Surgeon: Gatha Mayer, MD;  Location: [facility];  Service: Endoscopy;  Laterality: N/A;  . SAVORY DILATION N/A 01/02/2013   Procedure: SAVORY DILATION;  Surgeon: Gatha Mayer, MD;  Location: [facility];  Service: Endoscopy;  Laterality: N/A;  . SAVORY DILATION N/A 04/12/2013   Procedure: SAVORY DILATION;  Surgeon: Gatha Mayer, MD;  Location: [facility];  Service: Endoscopy;  Laterality: N/A;  . skin cancer removal  02/2016   family history includes Alzheimer's disease in her mother; Breast cancer in her maternal aunt; Esophageal cancer (age of onset: 63) in her son; Hypertension in her brother; Lung cancer in her father; Stomach cancer (age of onset: 89) in her son; Stroke in her mother. Social History   Social History  . Marital status: Married    Spouse name: N/A  . Number of children: 2  . Years of education: N/A   Occupational History  . Retired Retired   Social History Main Topics  . Smoking status: Former Smoker    Quit date: 02/29/1968  . Smokeless tobacco: Never Used  . Alcohol use No  . Drug use: No  . Sexual activity: No   Other Topics Concern  . None   Social History Narrative   Husband is fighting cancer. Exercises on stationary bike/walks   Daily caffeine     Review of Systems Having some eye symptoms that she thinks might be related to pemphigoid.  Objective:   Physical Exam @BP  (!) 138/56   Pulse 72   Ht 5' (1.524 m)   Wt 54.2 kg (119 lb 8 oz)   LMP 02/29/1968   BMI 23.34 kg/m @  General:  Well-developed, well-nourished and in no acute distress Eyes:  anicteric. ENT:   Mouth  and posterior pharynx free of lesions.  Neck:   supple w/o thyromegaly or mass.  Lungs: Clear to auscultation bilaterally. Heart:  S1S2, no rubs, murmurs, gallops. Abdomen:  soft, non-tender, no hepatosplenomegaly, hernia, or mass and BS+.  Lymph:  no cervical or supraclavicular adenopathy. Extremities:   no  cyanosis or clubbing Skin   no rash. Neuro:  A&O x 3.  Psych:  appropriate mood and  Affect.   Data Reviewed: 2015 EGD and dilation

## 2016-05-17 NOTE — Patient Instructions (Signed)
You have been scheduled for an endoscopy. Please follow written instructions given to you at your visit today. If you use inhalers (even only as needed), please bring them with you on the day of your procedure.   Chew carefully between now and your procedure.    I appreciate the opportunity to care for you. Silvano Rusk, MD, [facility]

## 2016-05-19 ENCOUNTER — Encounter (HOSPITAL_COMMUNITY): Payer: Self-pay | Admitting: *Deleted

## 2016-05-24 ENCOUNTER — Encounter (HOSPITAL_COMMUNITY)
Admission: RE | Disposition: A | Discharge status: Home / Self Care | Payer: Self-pay | Source: Ambulatory Visit | Attending: Internal Medicine

## 2016-05-24 ENCOUNTER — Encounter (HOSPITAL_COMMUNITY): Payer: Self-pay

## 2016-05-24 ENCOUNTER — Ambulatory Visit (HOSPITAL_COMMUNITY): Payer: Medicare Other

## 2016-05-24 ENCOUNTER — Ambulatory Visit (HOSPITAL_COMMUNITY)
Admission: RE | Admit: 2016-05-24 | Discharge: 2016-05-24 | Disposition: A | Discharge status: Home / Self Care | Payer: Medicare Other | Source: Ambulatory Visit | Attending: Internal Medicine | Admitting: Internal Medicine

## 2016-05-24 ENCOUNTER — Ambulatory Visit (HOSPITAL_COMMUNITY): Payer: Medicare Other | Admitting: Anesthesiology

## 2016-05-24 DIAGNOSIS — E1122 Type 2 diabetes mellitus with diabetic chronic kidney disease: Secondary | ICD-10-CM | POA: Diagnosis not present

## 2016-05-24 DIAGNOSIS — N189 Chronic kidney disease, unspecified: Secondary | ICD-10-CM | POA: Insufficient documentation

## 2016-05-24 DIAGNOSIS — R1310 Dysphagia, unspecified: Secondary | ICD-10-CM | POA: Diagnosis not present

## 2016-05-24 DIAGNOSIS — K2289 Other specified disease of esophagus: Secondary | ICD-10-CM

## 2016-05-24 DIAGNOSIS — Z803 Family history of malignant neoplasm of breast: Secondary | ICD-10-CM | POA: Diagnosis not present

## 2016-05-24 DIAGNOSIS — K228 Other specified diseases of esophagus: Secondary | ICD-10-CM | POA: Diagnosis not present

## 2016-05-24 DIAGNOSIS — Z9861 Coronary angioplasty status: Secondary | ICD-10-CM | POA: Diagnosis not present

## 2016-05-24 DIAGNOSIS — K648 Other hemorrhoids: Secondary | ICD-10-CM | POA: Diagnosis not present

## 2016-05-24 DIAGNOSIS — Z86010 Personal history of colonic polyps: Secondary | ICD-10-CM | POA: Diagnosis not present

## 2016-05-24 DIAGNOSIS — L121 Cicatricial pemphigoid: Secondary | ICD-10-CM | POA: Insufficient documentation

## 2016-05-24 DIAGNOSIS — Z79899 Other long term (current) drug therapy: Secondary | ICD-10-CM | POA: Insufficient documentation

## 2016-05-24 DIAGNOSIS — Z820 Family history of epilepsy and other diseases of the nervous system: Secondary | ICD-10-CM | POA: Insufficient documentation

## 2016-05-24 DIAGNOSIS — I2510 Atherosclerotic heart disease of native coronary artery without angina pectoris: Secondary | ICD-10-CM | POA: Diagnosis not present

## 2016-05-24 DIAGNOSIS — Z90710 Acquired absence of both cervix and uterus: Secondary | ICD-10-CM | POA: Insufficient documentation

## 2016-05-24 DIAGNOSIS — J309 Allergic rhinitis, unspecified: Secondary | ICD-10-CM | POA: Insufficient documentation

## 2016-05-24 DIAGNOSIS — Z87891 Personal history of nicotine dependence: Secondary | ICD-10-CM | POA: Diagnosis not present

## 2016-05-24 DIAGNOSIS — J302 Other seasonal allergic rhinitis: Secondary | ICD-10-CM

## 2016-05-24 DIAGNOSIS — Z7952 Long term (current) use of systemic steroids: Secondary | ICD-10-CM | POA: Diagnosis not present

## 2016-05-24 DIAGNOSIS — K222 Esophageal obstruction: Secondary | ICD-10-CM | POA: Insufficient documentation

## 2016-05-24 DIAGNOSIS — N3281 Overactive bladder: Secondary | ICD-10-CM | POA: Insufficient documentation

## 2016-05-24 DIAGNOSIS — K219 Gastro-esophageal reflux disease without esophagitis: Secondary | ICD-10-CM | POA: Insufficient documentation

## 2016-05-24 DIAGNOSIS — Z9849 Cataract extraction status, unspecified eye: Secondary | ICD-10-CM | POA: Diagnosis not present

## 2016-05-24 DIAGNOSIS — R1314 Dysphagia, pharyngoesophageal phase: Secondary | ICD-10-CM | POA: Diagnosis not present

## 2016-05-24 DIAGNOSIS — Z886 Allergy status to analgesic agent status: Secondary | ICD-10-CM | POA: Insufficient documentation

## 2016-05-24 DIAGNOSIS — Z8249 Family history of ischemic heart disease and other diseases of the circulatory system: Secondary | ICD-10-CM | POA: Insufficient documentation

## 2016-05-24 DIAGNOSIS — Z85828 Personal history of other malignant neoplasm of skin: Secondary | ICD-10-CM | POA: Diagnosis not present

## 2016-05-24 DIAGNOSIS — E785 Hyperlipidemia, unspecified: Secondary | ICD-10-CM | POA: Insufficient documentation

## 2016-05-24 DIAGNOSIS — Z7951 Long term (current) use of inhaled steroids: Secondary | ICD-10-CM | POA: Insufficient documentation

## 2016-05-24 DIAGNOSIS — Z823 Family history of stroke: Secondary | ICD-10-CM | POA: Insufficient documentation

## 2016-05-24 DIAGNOSIS — R1319 Other dysphagia: Secondary | ICD-10-CM

## 2016-05-24 DIAGNOSIS — Z800 Family history of malignant neoplasm of digestive organs: Secondary | ICD-10-CM | POA: Insufficient documentation

## 2016-05-24 DIAGNOSIS — I129 Hypertensive chronic kidney disease with stage 1 through stage 4 chronic kidney disease, or unspecified chronic kidney disease: Secondary | ICD-10-CM | POA: Diagnosis not present

## 2016-05-24 DIAGNOSIS — Z801 Family history of malignant neoplasm of trachea, bronchus and lung: Secondary | ICD-10-CM | POA: Insufficient documentation

## 2016-05-24 DIAGNOSIS — Z888 Allergy status to other drugs, medicaments and biological substances status: Secondary | ICD-10-CM | POA: Insufficient documentation

## 2016-05-24 DIAGNOSIS — I10 Essential (primary) hypertension: Secondary | ICD-10-CM | POA: Diagnosis not present

## 2016-05-24 DIAGNOSIS — Z887 Allergy status to serum and vaccine status: Secondary | ICD-10-CM | POA: Insufficient documentation

## 2016-05-24 DIAGNOSIS — L900 Lichen sclerosus et atrophicus: Secondary | ICD-10-CM | POA: Diagnosis not present

## 2016-05-24 HISTORY — PX: MALONEY DILATION: SHX5535

## 2016-05-24 HISTORY — PX: ESOPHAGOGASTRODUODENOSCOPY (EGD) WITH PROPOFOL: SHX5813

## 2016-05-24 SURGERY — ESOPHAGOGASTRODUODENOSCOPY (EGD) WITH PROPOFOL
Anesthesia: Monitor Anesthesia Care

## 2016-05-24 MED ORDER — LACTATED RINGERS IV SOLN
INTRAVENOUS | Status: DC | PRN
  Administered 2016-05-24: 09:00:00 via INTRAVENOUS

## 2016-05-24 MED ORDER — PROPOFOL 10 MG/ML IV BOLUS
INTRAVENOUS | Status: AC
  Filled 2016-05-24: qty 20

## 2016-05-24 MED ORDER — PROPOFOL 10 MG/ML IV BOLUS
INTRAVENOUS | Status: DC | PRN
  Administered 2016-05-24: 10 mg via INTRAVENOUS
  Administered 2016-05-24: 20 mg via INTRAVENOUS
  Administered 2016-05-24: 10 mg via INTRAVENOUS
  Administered 2016-05-24: 20 mg via INTRAVENOUS
  Administered 2016-05-24 (×3): 10 mg via INTRAVENOUS

## 2016-05-24 MED ORDER — SODIUM CHLORIDE 0.9 % IV SOLN: INTRAVENOUS | Status: DC

## 2016-05-24 SURGICAL SUPPLY — 15 items

## 2016-05-24 NOTE — Anesthesia Postprocedure Evaluation (Signed)
Anesthesia Post Note  Patient: NGA RABON  Procedure(s) Performed: Procedure(s) (LRB): ESOPHAGOGASTRODUODENOSCOPY (EGD) WITH PROPOFOL (N/A) MALONEY DILATION (N/A)  Patient location during evaluation: PACU Anesthesia Type: MAC Level of consciousness: awake and alert Pain management: pain level controlled Vital Signs Assessment: post-procedure vital signs reviewed and stable Respiratory status: spontaneous breathing, nonlabored ventilation, respiratory function stable and patient connected to nasal cannula oxygen Cardiovascular status: stable and blood pressure returned to baseline Anesthetic complications: no       Last Vitals:  Vitals:   05/24/16 0943 05/24/16 1000  BP: (!) 129/46 (!) 147/59  Pulse: 83 69  Resp: 19 15  Temp: 36.7 C     Last Pain:  Vitals:   05/24/16 0943  TempSrc: Oral                 Yacoub Diltz EDWARD

## 2016-05-24 NOTE — Discharge Instructions (Signed)
° °  I dilated the esophagus to 16 mm today. That should help a lot. If not as good as you need we can do some more in a few weeks. Please be sure to let me know how you are by calling back next week.  I would not be surprised if you spit up a small amount of blood after the dilation - large amounts please call.  I appreciate the opportunity to care for you. Gatha Mayer, MD, FACG   YOU HAD AN ENDOSCOPIC PROCEDURE TODAY: Refer to the procedure report and other information in the discharge instructions given to you for any specific questions about what was found during the examination. If this information does not answer your questions, please call Dr. Celesta Aver office at [phone] to clarify.   YOU SHOULD EXPECT: Some feelings of bloating in the abdomen. Passage of more gas than usual. Walking can help get rid of the air that was put into your GI tract during the procedure and reduce the bloating. If you had a lower endoscopy (such as a colonoscopy or flexible sigmoidoscopy) you may notice spotting of blood in your stool or on the toilet paper. Some abdominal soreness may be present for a day or two, also.  DIET:   Clear liquids only until 11 AM then try very soft foods. If ok tomorrow try solid food.   ACTIVITY: Your care partner should take you home directly after the procedure. You should plan to take it easy, moving slowly for the rest of the day. You can resume normal activity the day after the procedure however YOU SHOULD NOT DRIVE, use power tools, machinery or perform tasks that involve climbing or major physical exertion for 24 hours (because of the sedation medicines used during the test).   SYMPTOMS TO REPORT IMMEDIATELY: A gastroenterologist can be reached at any hour. Please call [phone]  for any of the following symptoms:   Following upper endoscopy (EGD, EUS, ERCP, esophageal dilation) Vomiting of blood or coffee ground material  New, significant abdominal pain    New, significant chest pain or pain under the shoulder blades  Painful or persistently difficult swallowing  New shortness of breath  Black, tarry-looking or red, bloody stools

## 2016-05-24 NOTE — Anesthesia Preprocedure Evaluation (Addendum)
Anesthesia Evaluation  Patient identified by MRN, date of birth, ID band Patient awake    Reviewed: Allergy & Precautions, H&P , Patient's Chart, lab work & pertinent test results, reviewed documented beta blocker date and time   Airway Mallampati: II  TM Distance: >3 FB Neck ROM: full    Dental no notable dental hx.    Pulmonary former smoker,    Pulmonary exam normal breath sounds clear to auscultation       Cardiovascular hypertension,  Rhythm:regular Rate:Normal     Neuro/Psych    GI/Hepatic   Endo/Other  diabetes  Renal/GU      Musculoskeletal   Abdominal   Peds  Hematology   Anesthesia Other Findings   Reproductive/Obstetrics                             Anesthesia Physical Anesthesia Plan  ASA: II  Anesthesia Plan: MAC   Post-op Pain Management:    Induction: Intravenous  Airway Management Planned: Mask and Natural Airway  Additional Equipment:   Intra-op Plan:   Post-operative Plan:   Informed Consent: I have reviewed the patients History and Physical, chart, labs and discussed the procedure including the risks, benefits and alternatives for the proposed anesthesia with the patient or authorized representative who has indicated his/her understanding and acceptance.   Dental Advisory Given  Plan Discussed with: CRNA and Surgeon  Anesthesia Plan Comments:         Anesthesia Quick Evaluation

## 2016-05-24 NOTE — H&P (View-Only) (Signed)
Victoria Allison 76 y.o. 22-Aug-1940 [id number]  Assessment & Plan:   Encounter Diagnoses  Name Primary?  . Esophageal dysphagia Yes  . Esophageal rings, acquired   . Odynophagia   . Pemphigoid     Sounds like rings are a problem again. Not sure why sehe is having the odynophagia features. EGD to evaluate and plans to dilate under fluoro again - Maloney dilation likely  The risks and benefits as well as alternatives of endoscopic procedure(s) have been discussed and reviewed. All questions answered. The patient agrees to proceed.  I appreciate the opportunity to care for her.  HQ:IONGE Tower, MD   Subjective:   Chief Complaint:Swallowing problems  HPI The patient is a delightful 76 year old married white one with a history of pemphigoid, and multiple rings of the esophagus causing dysphagia. She has had several esophageal dilations but none since 2015. In the past 2 months she has noted that she is having recurrent solid food dysphagia and also some painful swallowing. The painful swallowing and chest burning is a new feature. She is maintained on a PPI. She still uses dentures but there are little looser because she is having some gum recession. She is now carefully cutting her food and chewing well and for the most part is okay but is asking for help with the dysphagia again.  Wt Readings from Last 3 Encounters:  05/17/16 54.2 kg (119 lb 8 oz)  04/07/16 53.5 kg (118 lb)  07/14/15 54.3 kg (119 lb 12.8 oz)   Allergies  Allergen Reactions  . Aspirin     REACTION: bleeds too easily-  . Lipitor [Atorvastatin]     REACTION: elevated LFT's  . Tetanus Toxoid     REACTION: rash  . Ultram [Tramadol]     dizzy   Current Meds  Medication Sig  . acetaminophen (TYLENOL) 325 MG tablet Take 325 mg by mouth. OTC as directed   . brimonidine (ALPHAGAN) 0.15 % ophthalmic solution   . fluticasone (FLONASE) 50 MCG/ACT nasal spray Place 2 sprays into both nostrils daily.  Marland Kitchen  lisinopril (PRINIVIL,ZESTRIL) 10 MG tablet Take 1 tablet (10 mg total) by mouth daily.  Marland Kitchen omeprazole (PRILOSEC) 40 MG capsule Take 1 capsule (40 mg total) by mouth daily.  Marland Kitchen oxybutynin (DITROPAN XL) 15 MG 24 hr tablet Take 1 tablet (15 mg total) by mouth daily.  . pravastatin (PRAVACHOL) 40 MG tablet Take 1 tablet (40 mg total) by mouth daily.  . prednisoLONE acetate (OMNIPRED) 1 % ophthalmic suspension One drop in each eye daily   . timolol (BETIMOL) 0.5 % ophthalmic solution Place 1 drop into both eyes 2 (two) times daily.    Past Medical History:  Diagnosis Date  . Allergic rhinitis   . CAD (coronary artery disease)   . Cancer ([place])    skin  . Chronic kidney disease    overactive bladder  . Colon polyp   . Diverticulosis    sigmoid and descending colon  . DM (diabetes mellitus) ([place])    no longer take any diabetic meds now that off Prednisone  . Elevated LFTs   . Esophageal ring   . GERD (gastroesophageal reflux disease)   . History of hepatitis    unsure of type  . Hyperlipemia   . Hypertension   . Internal hemorrhoids   . Lichen sclerosus 95/28   (DX BY GYN AFTER ABN PAP)  . Pemphigoid, cicatricial    D/O AFFECTING EYES, MOUTH, THROAT NOW (DX [facility])  . Shingles  03/13/2013  . Urinary incontinence    OVERACTIVE BLADDER   Past Surgical History:  Procedure Laterality Date  . ABDOMINAL HYSTERECTOMY    . ANGIOPLASTY     LAD  . CARDIAC ELECTROPHYSIOLOGY MAPPING AND ABLATION    . CATARACT EXTRACTION    . COLONOSCOPY    . ESOPHAGOGASTRODUODENOSCOPY  09/14/2011   Procedure: ESOPHAGOGASTRODUODENOSCOPY (EGD);  Surgeon: Gatha Mayer, MD;  Location: [facility];  Service: Endoscopy;  Laterality: N/A;  needs xray  . ESOPHAGOGASTRODUODENOSCOPY N/A 01/02/2013   Procedure: ESOPHAGOGASTRODUODENOSCOPY (EGD);  Surgeon: Gatha Mayer, MD;  Location: [facility];  Service: Endoscopy;  Laterality: N/A;  . ESOPHAGOGASTRODUODENOSCOPY N/A 04/12/2013   Procedure: ESOPHAGOGASTRODUODENOSCOPY  (EGD);  Surgeon: Gatha Mayer, MD;  Location: [facility];  Service: Endoscopy;  Laterality: N/A;  . Venia Minks DILATION N/A 01/02/2013   Procedure: Venia Minks DILATION;  Surgeon: Gatha Mayer, MD;  Location: [facility];  Service: Endoscopy;  Laterality: N/A;  . NOSE SURGERY     Biopsy  . OOPHORECTOMY    . SAVORY DILATION  09/14/2011   Procedure: SAVORY DILATION;  Surgeon: Gatha Mayer, MD;  Location: [facility];  Service: Endoscopy;  Laterality: N/A;  . SAVORY DILATION N/A 01/02/2013   Procedure: SAVORY DILATION;  Surgeon: Gatha Mayer, MD;  Location: [facility];  Service: Endoscopy;  Laterality: N/A;  . SAVORY DILATION N/A 04/12/2013   Procedure: SAVORY DILATION;  Surgeon: Gatha Mayer, MD;  Location: [facility];  Service: Endoscopy;  Laterality: N/A;  . skin cancer removal  02/2016   family history includes Alzheimer's disease in her mother; Breast cancer in her maternal aunt; Esophageal cancer (age of onset: 10) in her son; Hypertension in her brother; Lung cancer in her father; Stomach cancer (age of onset: 39) in her son; Stroke in her mother. Social History   Social History  . Marital status: Married    Spouse name: N/A  . Number of children: 2  . Years of education: N/A   Occupational History  . Retired Retired   Social History Main Topics  . Smoking status: Former Smoker    Quit date: 02/29/1968  . Smokeless tobacco: Never Used  . Alcohol use No  . Drug use: No  . Sexual activity: No   Other Topics Concern  . None   Social History Narrative   Husband is fighting cancer. Exercises on stationary bike/walks   Daily caffeine     Review of Systems Having some eye symptoms that she thinks might be related to pemphigoid.  Objective:   Physical Exam @BP  (!) 138/56   Pulse 72   Ht 5' (1.524 m)   Wt 54.2 kg (119 lb 8 oz)   LMP 02/29/1968   BMI 23.34 kg/m @  General:  Well-developed, well-nourished and in no acute distress Eyes:  anicteric. ENT:   Mouth  and posterior pharynx free of lesions.  Neck:   supple w/o thyromegaly or mass.  Lungs: Clear to auscultation bilaterally. Heart:  S1S2, no rubs, murmurs, gallops. Abdomen:  soft, non-tender, no hepatosplenomegaly, hernia, or mass and BS+.  Lymph:  no cervical or supraclavicular adenopathy. Extremities:   no  cyanosis or clubbing Skin   no rash. Neuro:  A&O x 3.  Psych:  appropriate mood and  Affect.   Data Reviewed: 2015 EGD and dilation

## 2016-05-24 NOTE — Anesthesia Preprocedure Evaluation (Signed)
Anesthesia Evaluation  Patient identified by MRN, date of birth, ID band Patient awake    Reviewed: Allergy & Precautions, H&P , NPO status , Patient's Chart, lab work & pertinent test results, reviewed documented beta blocker date and time   Airway Mallampati: II  TM Distance: >3 FB Neck ROM: full    Dental no notable dental hx.    Pulmonary former smoker,    Pulmonary exam normal breath sounds clear to auscultation       Cardiovascular hypertension,  Rhythm:regular Rate:Normal     Neuro/Psych    GI/Hepatic   Endo/Other  diabetes  Renal/GU      Musculoskeletal   Abdominal   Peds  Hematology   Anesthesia Other Findings DM HTN CAD  Reproductive/Obstetrics                            Anesthesia Physical Anesthesia Plan  ASA: II  Anesthesia Plan: MAC   Post-op Pain Management:    Induction: Intravenous  Airway Management Planned: Mask and Natural Airway  Additional Equipment:   Intra-op Plan:   Post-operative Plan:   Informed Consent: I have reviewed the patients History and Physical, chart, labs and discussed the procedure including the risks, benefits and alternatives for the proposed anesthesia with the patient or authorized representative who has indicated his/her understanding and acceptance.   Dental Advisory Given  Plan Discussed with: CRNA and Surgeon  Anesthesia Plan Comments:         Anesthesia Quick Evaluation

## 2016-05-24 NOTE — Op Note (Signed)
[facility] Patient Name: Victoria Allison Procedure Date: 05/24/2016 MRN: [id number] Attending MD: Gatha Mayer , MD Date of Birth: 08/15/1940 CSN: [id number] Age: 76 Admit Type: Outpatient Procedure:                Upper GI endoscopy Indications:              Dysphagia, Stricture of the esophagus, For therapy                            of esophageal stricture Providers:                Gatha Mayer, MD, Elmer Ramp. Tilden Dome, RN, Corliss Parish, Technician Referring MD:              Medicines:                Propofol per Anesthesia, Monitored Anesthesia Care Complications:            No immediate complications. Estimated Blood Loss:     Estimated blood loss was minimal. Procedure:                Pre-Anesthesia Assessment:                           - Prior to the procedure, a History and Physical                            was performed, and patient medications and                            allergies were reviewed. The patient's tolerance of                            previous anesthesia was also reviewed. The risks                            and benefits of the procedure and the sedation                            options and risks were discussed with the patient.                            All questions were answered, and informed consent                            was obtained. Prior Anticoagulants: The patient has                            taken no previous anticoagulant or antiplatelet                            agents. ASA Grade Assessment: II - A patient with  mild systemic disease. After reviewing the risks                            and benefits, the patient was deemed in                            satisfactory condition to undergo the procedure.                           After obtaining informed consent, the endoscope was                            passed under direct vision. Throughout the             procedure, the patient's blood pressure, pulse, and                            oxygen saturations were monitored continuously. The                            EG-[id number] ([id number]) scope was introduced through the                            mouth, and advanced to the second part of duodenum.                            The upper GI endoscopy was accomplished without                            difficulty. The patient tolerated the procedure                            well. Scope In: Scope Out: Findings:      Multiple moderate benign-appearing, intrinsic stenoses were found in the       entire esophagus. The narrowest stenosis measured 1.2 cm (inner       diameter) and they were traversed. The scope was withdrawn. Dilation was       performed with a Maloney dilator with mild resistance at 44 Fr and 48       Fr. Fluoroscoopic guidance used. The dilation site was examined       following endoscope reinsertion and showed moderate improvement in       luminal narrowing. Estimated blood loss was minimal.      The exam was otherwise without abnormality.      The cardia and gastric fundus were normal on retroflexion. Impression:               - Benign-appearing esophageal stenoses. Dilated.                           - The examination was otherwise normal.                           - No specimens collected. Moderate Sedation:      N/A- Per Anesthesia Care Recommendation:           - Patient  has a contact number available for                            emergencies. The signs and symptoms of potential                            delayed complications were discussed with the                            patient. Return to normal activities tomorrow.                            Written discharge instructions were provided to the                            patient.                           - Clear liquids x 1 hour then soft foods rest of                            day. Start prior diet tomorrow.                            - Continue present medications.                           - Call back next week and let Dr. Carlean Purl know how                            swallowing is to see if additional dilation needed. Procedure Code(s):        --- Professional ---                           [phone], Esophagogastroduodenoscopy, flexible,                            transoral; diagnostic, including collection of                            specimen(s) by brushing or washing, when performed                            (separate procedure)                           43450, Dilation of esophagus, by unguided sound or                            bougie, single or multiple passes Diagnosis Code(s):        --- Professional ---                           K22.2, Esophageal obstruction  R13.10, Dysphagia, unspecified CPT copyright 2016 American Medical Association. All rights reserved. The codes documented in this report are preliminary and upon coder review may  be revised to meet current compliance requirements. Gatha Mayer, MD 05/24/2016 9:48:33 AM This report has been signed electronically. Number of Addenda: 0

## 2016-05-24 NOTE — Interval H&P Note (Signed)
History and Physical Interval Note:  05/24/2016 9:22 AM  Victoria Allison  has presented today for surgery, with the diagnosis of dysphagia  The various methods of treatment have been discussed with the patient and family. After consideration of risks, benefits and other options for treatment, the patient has consented to  Procedure(s): ESOPHAGOGASTRODUODENOSCOPY (EGD) WITH PROPOFOL (N/A) MALONEY DILATION (N/A) as a surgical intervention .  The patient's history has been reviewed, patient examined, no change in status, stable for surgery.  I have reviewed the patient's chart and labs.  Questions were answered to the patient's satisfaction.     Silvano Rusk

## 2016-05-24 NOTE — Transfer of Care (Signed)
Immediate Anesthesia Transfer of Care Note  Patient: Victoria Allison  Procedure(s) Performed: Procedure(s): ESOPHAGOGASTRODUODENOSCOPY (EGD) WITH PROPOFOL (N/A) MALONEY DILATION (N/A)  Patient Location: PACU  Anesthesia Type:MAC  Level of Consciousness: sedated  Airway & Oxygen Therapy: Patient Spontanous Breathing and Patient connected to nasal cannula oxygen  Post-op Assessment: Report given to RN and Post -op Vital signs reviewed and stable  Post vital signs: Reviewed and stable  Last Vitals:  Vitals:   05/24/16 0907  BP: (!) 152/79  Pulse: (!) 58  Resp: 12  Temp: 36.9 C    Last Pain:  Vitals:   05/24/16 0907  TempSrc: Oral         Complications: No apparent anesthesia complications

## 2016-05-25 ENCOUNTER — Encounter (HOSPITAL_COMMUNITY): Payer: Self-pay | Admitting: Internal Medicine

## 2016-05-25 NOTE — Addendum Note (Signed)
Addendum  created 05/25/16 1026 by Lyndle Herrlich, MD   Sign clinical note

## 2016-05-25 NOTE — Addendum Note (Signed)
Addendum  created 05/25/16 1031 by Lyndle Herrlich, MD   Sign clinical note

## 2016-06-14 DIAGNOSIS — L121 Cicatricial pemphigoid: Secondary | ICD-10-CM | POA: Diagnosis not present

## 2016-07-05 ENCOUNTER — Telehealth: Payer: Self-pay | Admitting: Family Medicine

## 2016-07-05 ENCOUNTER — Ambulatory Visit (INDEPENDENT_AMBULATORY_CARE_PROVIDER_SITE_OTHER): Payer: Medicare Other | Admitting: Family Medicine

## 2016-07-05 ENCOUNTER — Encounter: Payer: Self-pay | Admitting: Family Medicine

## 2016-07-05 VITALS — BP 124/78 | HR 62 | Temp 97.8°F | Ht 60.0 in | Wt 117.0 lb

## 2016-07-05 DIAGNOSIS — I10 Essential (primary) hypertension: Secondary | ICD-10-CM

## 2016-07-05 DIAGNOSIS — T304 Corrosion of unspecified body region, unspecified degree: Secondary | ICD-10-CM | POA: Insufficient documentation

## 2016-07-05 DIAGNOSIS — E785 Hyperlipidemia, unspecified: Secondary | ICD-10-CM

## 2016-07-05 MED ORDER — SILVER SULFADIAZINE 1 % EX CREA: 1.0000 "application " | TOPICAL_CREAM | Freq: Every day | CUTANEOUS | 0 refills | Status: DC

## 2016-07-05 NOTE — Progress Notes (Signed)
Subjective:    Patient ID: Victoria Allison, female    DOB: Mar 02, 1940, 76 y.o.   MRN: [id number]  HPI  76 yo pt here for a skin burn on her left thumb Hx of oral pemphigoid   She used a very strong drain cleaner a week ago - a bit splashed on her L proximal thumb She did not even feel it  Then started with a blister  Then opened up   She used a cream from derm - then polysporin   It hurts/is sore  No drainage for the most part  Not a lot of redness    Patient Active Problem List   Diagnosis Date Noted  . Chemical burn 07/05/2016  . Exposure to influenza 04/14/2015  . Acute non-recurrent maxillary sinusitis 07/07/2014  . Cramp in muscle 04/22/2014  . Colon cancer screening 12/13/2013  . Esophageal dysphagia 04/12/2013  . History of shingles 03/13/2013  . Encounter for Medicare annual wellness exam 12/10/2012  . Esophageal rings 08/24/2011  . SUPRAVENTRICULAR TACHYCARDIA 09/22/2009  . ARTHRITIS, SHOULDER 05/21/2009  . INSOMNIA 02/05/2009  . POSTMENOPAUSAL STATUS 06/02/2008  . PEMPHIGOID 11/14/2007  . Nonspecific (abnormal) findings on radiological and other examination of body structure 07/05/2007  . Hyperlipidemia LDL goal <100 03/13/2007  . Essential hypertension 03/13/2007  . CORONARY ARTERY DISEASE 03/13/2007  . Allergic rhinitis 03/13/2007  . GERD 03/13/2007  . HAIR LOSS 03/13/2007  . LEG PAIN, CHRONIC 03/13/2007  . URINARY INCONTINENCE 03/13/2007  . COLONIC POLYPS, HX OF 03/13/2007  . HX, URINARY INFECTION 08/14/2006   Past Medical History:  Diagnosis Date  . Allergic rhinitis   . CAD (coronary artery disease)   . Cancer ([place])    skin  . Chronic kidney disease    overactive bladder  . Colon polyp   . Diverticulosis    sigmoid and descending colon  . DM (diabetes mellitus) ([place])    no longer take any diabetic meds now that off Prednisone, type 2 diet controlled  . Elevated LFTs over one year ago  . Esophageal ring   . GERD (gastroesophageal reflux  disease)   . History of hepatitis 1975   unsure of type  . Hyperlipemia   . Hypertension   . Internal hemorrhoids   . Lichen sclerosus 91/63   (DX BY GYN AFTER ABN PAP)  . Pemphigoid, cicatricial    D/O AFFECTING EYES, MOUTH, THROAT NOW (DX [facility])  . Shingles 03/13/2013  . Urinary incontinence    OVERACTIVE BLADDER   Past Surgical History:  Procedure Laterality Date  . ABDOMINAL HYSTERECTOMY    . ANGIOPLASTY  yrs ago   LAD  . CARDIAC ELECTROPHYSIOLOGY MAPPING AND ABLATION    . CATARACT EXTRACTION Bilateral   . COLONOSCOPY    . ESOPHAGOGASTRODUODENOSCOPY  09/14/2011   Procedure: ESOPHAGOGASTRODUODENOSCOPY (EGD);  Surgeon: Gatha Mayer, MD;  Location: [facility];  Service: Endoscopy;  Laterality: N/A;  needs xray  . ESOPHAGOGASTRODUODENOSCOPY N/A 01/02/2013   Procedure: ESOPHAGOGASTRODUODENOSCOPY (EGD);  Surgeon: Gatha Mayer, MD;  Location: [facility];  Service: Endoscopy;  Laterality: N/A;  . ESOPHAGOGASTRODUODENOSCOPY N/A 04/12/2013   Procedure: ESOPHAGOGASTRODUODENOSCOPY (EGD);  Surgeon: Gatha Mayer, MD;  Location: [facility];  Service: Endoscopy;  Laterality: N/A;  . ESOPHAGOGASTRODUODENOSCOPY (EGD) WITH PROPOFOL N/A 05/24/2016   Procedure: ESOPHAGOGASTRODUODENOSCOPY (EGD) WITH PROPOFOL;  Surgeon: Gatha Mayer, MD;  Location: [facility];  Service: Endoscopy;  Laterality: N/A;  . MALONEY DILATION N/A 01/02/2013   Procedure: Venia Minks DILATION;  Surgeon: Ofilia Neas  Carlean Purl, MD;  Location: [facility];  Service: Endoscopy;  Laterality: N/A;  . Venia Minks DILATION N/A 05/24/2016   Procedure: Venia Minks DILATION;  Surgeon: Gatha Mayer, MD;  Location: [facility];  Service: Endoscopy;  Laterality: N/A;  . NOSE SURGERY     Biopsy  . OOPHORECTOMY    . SAVORY DILATION  09/14/2011   Procedure: SAVORY DILATION;  Surgeon: Gatha Mayer, MD;  Location: [facility];  Service: Endoscopy;  Laterality: N/A;  . SAVORY DILATION N/A 01/02/2013   Procedure: SAVORY DILATION;  Surgeon: Gatha Mayer, MD;  Location: [facility];  Service: Endoscopy;  Laterality: N/A;  . SAVORY DILATION N/A 04/12/2013   Procedure: SAVORY DILATION;  Surgeon: Gatha Mayer, MD;  Location: [facility];  Service: Endoscopy;  Laterality: N/A;  . skin cancer removal  02/2016   Social History  Substance Use Topics  . Smoking status: Former Smoker    Quit date: 02/29/1968  . Smokeless tobacco: Never Used  . Alcohol use No   Family History  Problem Relation Age of Onset  . Lung cancer Father   . Esophageal cancer Son 70  . Stroke Mother     CVA  . Alzheimer's disease Mother   . Hypertension Brother   . Stomach cancer Son 72  . Breast cancer Maternal Aunt   . Colon cancer Neg Hx   . Rectal cancer Neg Hx    Allergies  Allergen Reactions  . Aspirin     REACTION: bleeds too easily-  . Lipitor [Atorvastatin]     REACTION: elevated LFT's  . Ultram [Tramadol]     dizzy  . Adhesive  [Tape] Rash  . Latex Itching and Rash  . Tetanus Toxoid Rash   Current Outpatient Prescriptions on File Prior to Visit  Medication Sig Dispense Refill  . acetaminophen (TYLENOL) 500 MG tablet Take 500 mg by mouth daily as needed for moderate pain or headache.    . brimonidine (ALPHAGAN) 0.15 % ophthalmic solution Place 1 drop into both eyes 2 (two) times daily.     . fluticasone (FLONASE) 50 MCG/ACT nasal spray Place 2 sprays into both nostrils daily as needed for allergies.    Marland Kitchen lisinopril (PRINIVIL,ZESTRIL) 10 MG tablet Take 1 tablet (10 mg total) by mouth daily. 90 tablet 3  . omeprazole (PRILOSEC) 40 MG capsule Take 1 capsule (40 mg total) by mouth daily. 90 capsule 3  . oxybutynin (DITROPAN XL) 15 MG 24 hr tablet Take 1 tablet (15 mg total) by mouth daily. 90 tablet 3  . pravastatin (PRAVACHOL) 40 MG tablet Take 1 tablet (40 mg total) by mouth at bedtime.    . prednisoLONE acetate (OMNIPRED) 1 % ophthalmic suspension Place 1 drop into both eyes daily.     . sodium chloride (OCEAN) 0.65 % SOLN nasal spray  Place 1 spray into both nostrils as needed for congestion.    . timolol (BETIMOL) 0.5 % ophthalmic solution Place 1 drop into both eyes 2 (two) times daily.      No current facility-administered medications on file prior to visit.     Review of Systems    Review of Systems  Constitutional: Negative for fever, appetite change, fatigue and unexpected weight change.  Eyes: Negative for pain and visual disturbance.  ENt pos for oral pemphigoid affecting gums  Respiratory: Negative for cough and shortness of breath.   Cardiovascular: Negative for cp or palpitations    Gastrointestinal: Negative for nausea, diarrhea and constipation.  Genitourinary: Negative for  urgency and frequency.  Skin: Negative for pallor or rash  pos for chemical burn on L thumb Neurological: Negative for weakness, light-headedness, numbness and headaches.  Hematological: Negative for adenopathy. Does not bruise/bleed easily.  Psychiatric/Behavioral: Negative for dysphoric mood. The patient is not nervous/anxious.      Objective:   Physical Exam  Constitutional: She appears well-developed and well-nourished. No distress.  Well appearing elderly female  HENT:  Head: Normocephalic and atraumatic.  Eyes: Conjunctivae and EOM are normal. Pupils are equal, round, and reactive to light.  Neck: Normal range of motion. Neck supple.  Cardiovascular: Normal rate.   Pulmonary/Chest: Effort normal and breath sounds normal.  Musculoskeletal: She exhibits no edema.  Lymphadenopathy:    She has no cervical adenopathy.  Neurological: She is alert.  Skin: Skin is warm and dry. No pallor.  Chemical burn L proximal dorsal thumb area - several oval shaped areas 1 cm and 0.5 cm  Healing granulation tissue present/ one small vesicle and 1-2 mm collar of erythema without swelling No drainage Moderately tender   Dressed with silvadene and non stick gauze    Psychiatric: She has a normal mood and affect.          Assessment  & Plan:   Problem List Items Addressed This Visit      Other   Chemical burn    From a splash of drain cleaner a week ago  Looks to be healing well- tender as expected and minimal redness  Disc healing by 2ndary intention and what to expect  Px silvadene cream for use daily with loose dressing  Watch for s/s of infx Wound care disc and handout given  Counseled re: avoiding chemical exposure in the future

## 2016-07-05 NOTE — Telephone Encounter (Signed)
-----   Message from Eustace Pen, LPN sent at 06/01/3152  4:09 PM EDT ----- Regarding: Labs 07/05/16 Please place lab orders.   Traditional Medicare

## 2016-07-05 NOTE — Assessment & Plan Note (Signed)
From a splash of drain cleaner a week ago  Looks to be healing well- tender as expected and minimal redness  Disc healing by 2ndary intention and what to expect  Px silvadene cream for use daily with loose dressing  Watch for s/s of infx Wound care disc and handout given  Counseled re: avoiding chemical exposure in the future

## 2016-07-05 NOTE — Progress Notes (Signed)
Pre visit review using our clinic review tool, if applicable. No additional management support is needed unless otherwise documented below in the visit note. 

## 2016-07-05 NOTE — Patient Instructions (Signed)
You have a chemical burn on your hand that is getting better It does not look infected - we want to prevent infection  Use the silvadene cream once daily  Cover lightly  Keep clean with soap and water  Watch for redness/swelling or new drainage or increased pain   Do not submerge until healed  This may take 1-2 months to heal completely   Update [place] if anything changes

## 2016-07-08 ENCOUNTER — Ambulatory Visit (INDEPENDENT_AMBULATORY_CARE_PROVIDER_SITE_OTHER): Payer: Medicare Other

## 2016-07-08 VITALS — BP 118/78 | HR 61 | Temp 97.8°F | Ht 60.5 in | Wt 115.5 lb

## 2016-07-08 DIAGNOSIS — I10 Essential (primary) hypertension: Secondary | ICD-10-CM | POA: Diagnosis not present

## 2016-07-08 DIAGNOSIS — E785 Hyperlipidemia, unspecified: Secondary | ICD-10-CM | POA: Diagnosis not present

## 2016-07-08 DIAGNOSIS — Z0000 Encounter for general adult medical examination without abnormal findings: Secondary | ICD-10-CM

## 2016-07-08 LAB — COMPREHENSIVE METABOLIC PANEL
ALT: 15 U/L (ref 0–35)
AST: 15 U/L (ref 0–37)
Albumin: 3.8 g/dL (ref 3.5–5.2)
Alkaline Phosphatase: 73 U/L (ref 39–117)
BUN: 9 mg/dL (ref 6–23)
CO2: 30 mEq/L (ref 19–32)
Calcium: 9.2 mg/dL (ref 8.4–10.5)
Chloride: 102 mEq/L (ref 96–112)
Creatinine, Ser: 0.69 mg/dL (ref 0.40–1.20)
GFR: 87.47 mL/min (ref >60.00)
Glucose, Bld: 99 mg/dL (ref 70–99)
Potassium: 4.5 mEq/L (ref 3.5–5.1)
Sodium: 135 mEq/L (ref 135–145)
Total Bilirubin: 0.3 mg/dL (ref 0.2–1.2)
Total Protein: 6.7 g/dL (ref 6.0–8.3)

## 2016-07-08 LAB — CBC WITH DIFFERENTIAL/PLATELET
Basophils Absolute: 0.1 10*3/uL (ref 0.0–0.1)
Basophils Relative: 1 % (ref 0.0–3.0)
Eosinophils Absolute: 0.1 10*3/uL (ref 0.0–0.7)
Eosinophils Relative: 1.8 % (ref 0.0–5.0)
HCT: 37.2 % (ref 36.0–46.0)
Hemoglobin: 12.6 g/dL (ref 12.0–15.0)
Lymphocytes Relative: 36.1 % (ref 12.0–46.0)
Lymphs Abs: 2.2 10*3/uL (ref 0.7–4.0)
MCHC: 33.8 g/dL (ref 30.0–36.0)
MCV: 87.2 fl (ref 78.0–100.0)
Monocytes Absolute: 0.4 10*3/uL (ref 0.1–1.0)
Monocytes Relative: 6.7 % (ref 3.0–12.0)
Neutro Abs: 3.3 10*3/uL (ref 1.4–7.7)
Neutrophils Relative %: 54.4 % (ref 43.0–77.0)
Platelets: 194 10*3/uL (ref 150.0–400.0)
RBC: 4.27 Mil/uL (ref 3.87–5.11)
RDW: 13.4 % (ref 11.5–15.5)
WBC: 6 10*3/uL (ref 4.0–10.5)

## 2016-07-08 LAB — LIPID PANEL
Cholesterol: 169 mg/dL (ref 0–200)
HDL: 50.2 mg/dL (ref >39.00)
LDL Cholesterol: 82 mg/dL (ref 0–99)
NonHDL: 118.61
Total CHOL/HDL Ratio: 3
Triglycerides: 181 mg/dL — ABNORMAL HIGH (ref 0.0–149.0)
VLDL: 36.2 mg/dL (ref 0.0–40.0)

## 2016-07-08 LAB — TSH: TSH: 3.63 u[IU]/mL (ref 0.35–4.50)

## 2016-07-08 NOTE — Patient Instructions (Addendum)
Victoria Allison , Thank you for taking time to come for your Medicare Wellness Visit. I appreciate your ongoing commitment to your health goals. Please review the following plan we discussed and let me know if I can assist you in the future.   These are the goals we discussed: Goals    . Eat more fruits and vegetables          Starting 07/08/2016, I will continue to increase my intake of fresh fruits and vegetables to at least 4 servings daily.        This is a list of the screening recommended for you and due dates:  Health Maintenance  Topic Date Due  . Mammogram  07/16/2016  . Flu Shot  09/28/2016  . Tetanus Vaccine  06/04/2018  . DEXA scan (bone density measurement)  Completed  . Pneumonia vaccines  Completed    Preventive Care for Adults  A healthy lifestyle and preventive care can promote health and wellness. Preventive health guidelines for adults include the following key practices.  . A routine yearly physical is a good way to check with your health care provider about your health and preventive screening. It is a chance to share any concerns and updates on your health and to receive a thorough exam.  . Visit your dentist for a routine exam and preventive care every 6 months. Brush your teeth twice a day and floss once a day. Good oral hygiene prevents tooth decay and gum disease.  . The frequency of eye exams is based on your age, health, family medical history, use  of contact lenses, and other factors. Follow your health care provider's ecommendations for frequency of eye exams.  . Eat a healthy diet. Foods like vegetables, fruits, whole grains, low-fat dairy products, and lean protein foods contain the nutrients you need without too many calories. Decrease your intake of foods high in solid fats, added sugars, and salt. Eat the right amount of calories for you. Get information about a proper diet from your health care provider, if necessary.  . Regular physical exercise is  one of the most important things you can do for your health. Most adults should get at least 150 minutes of moderate-intensity exercise (any activity that increases your heart rate and causes you to sweat) each week. In addition, most adults need muscle-strengthening exercises on 2 or more days a week.  Silver Sneakers may be a benefit available to you. To determine eligibility, you may visit the website: www.silversneakers.com or contact program at (619) 402-5826 Mon-Fri between 8AM-8PM.   . Maintain a healthy weight. The body mass index (BMI) is a screening tool to identify possible weight problems. It provides an estimate of body fat based on height and weight. Your health care provider can find your BMI and can help you achieve or maintain a healthy weight.   For adults 20 years and older: ? A BMI below 18.5 is considered underweight. ? A BMI of 18.5 to 24.9 is normal. ? A BMI of 25 to 29.9 is considered overweight. ? A BMI of 30 and above is considered obese.   . Maintain normal blood lipids and cholesterol levels by exercising and minimizing your intake of saturated fat. Eat a balanced diet with plenty of fruit and vegetables. Blood tests for lipids and cholesterol should begin at age 29 and be repeated every 5 years. If your lipid or cholesterol levels are high, you are over 50, or you are at high risk for heart disease,  you may need your cholesterol levels checked more frequently. Ongoing high lipid and cholesterol levels should be treated with medicines if diet and exercise are not working.  . If you smoke, find out from your health care provider how to quit. If you do not use tobacco, please do not start.  . If you choose to drink alcohol, please do not consume more than 2 drinks per day. One drink is considered to be 12 ounces (355 mL) of beer, 5 ounces (148 mL) of wine, or 1.5 ounces (44 mL) of liquor.  . If you are 56-94 years old, ask your health care provider if you should take  aspirin to prevent strokes.  . Use sunscreen. Apply sunscreen liberally and repeatedly throughout the day. You should seek shade when your shadow is shorter than you. Protect yourself by wearing long sleeves, pants, a wide-brimmed hat, and sunglasses year round, whenever you are outdoors.  . Once a month, do a whole body skin exam, using a mirror to look at the skin on your back. Tell your health care provider of new moles, moles that have irregular borders, moles that are larger than a pencil eraser, or moles that have changed in shape or color.

## 2016-07-08 NOTE — Progress Notes (Signed)
Subjective:   Victoria Allison is a 76 y.o. female who presents for Medicare Annual (Subsequent) preventive examination.  Review of Systems:  N/A Cardiac Risk Factors include: advanced age (>34men, >30 women);dyslipidemia     Objective:     Vitals: BP 118/78 (BP Location: Right Arm, Patient Position: Sitting, Cuff Size: Normal)   Pulse 61   Temp 97.8 F (36.6 C) (Oral)   Ht 5' 0.5" (1.537 m) Comment: no shoes  Wt 115 lb 8 oz (52.4 kg)   LMP 02/29/1968   SpO2 99%   BMI 22.19 kg/m   Body mass index is 22.19 kg/m.   Tobacco History  Smoking Status  . Former Smoker  . Quit date: 02/29/1968  Smokeless Tobacco  . Never Used     Counseling given: No   Past Medical History:  Diagnosis Date  . Allergic rhinitis   . CAD (coronary artery disease)   . Cancer ([place])    skin  . Chronic kidney disease    overactive bladder  . Colon polyp   . Diverticulosis    sigmoid and descending colon  . DM (diabetes mellitus) ([place])    no longer take any diabetic meds now that off Prednisone, type 2 diet controlled  . Elevated LFTs over one year ago  . Esophageal ring   . GERD (gastroesophageal reflux disease)   . History of hepatitis 1975   unsure of type  . Hyperlipemia   . Hypertension   . Internal hemorrhoids   . Lichen sclerosus 54/49   (DX BY GYN AFTER ABN PAP)  . Pemphigoid, cicatricial    D/O AFFECTING EYES, MOUTH, THROAT NOW (DX [facility])  . Shingles 03/13/2013  . Urinary incontinence    OVERACTIVE BLADDER   Past Surgical History:  Procedure Laterality Date  . ABDOMINAL HYSTERECTOMY    . ANGIOPLASTY  yrs ago   LAD  . CARDIAC ELECTROPHYSIOLOGY MAPPING AND ABLATION    . CATARACT EXTRACTION Bilateral   . COLONOSCOPY    . ESOPHAGOGASTRODUODENOSCOPY  09/14/2011   Procedure: ESOPHAGOGASTRODUODENOSCOPY (EGD);  Surgeon: Gatha Mayer, MD;  Location: [facility];  Service: Endoscopy;  Laterality: N/A;  needs xray  . ESOPHAGOGASTRODUODENOSCOPY N/A 01/02/2013   Procedure:  ESOPHAGOGASTRODUODENOSCOPY (EGD);  Surgeon: Gatha Mayer, MD;  Location: [facility];  Service: Endoscopy;  Laterality: N/A;  . ESOPHAGOGASTRODUODENOSCOPY N/A 04/12/2013   Procedure: ESOPHAGOGASTRODUODENOSCOPY (EGD);  Surgeon: Gatha Mayer, MD;  Location: [facility];  Service: Endoscopy;  Laterality: N/A;  . ESOPHAGOGASTRODUODENOSCOPY (EGD) WITH PROPOFOL N/A 05/24/2016   Procedure: ESOPHAGOGASTRODUODENOSCOPY (EGD) WITH PROPOFOL;  Surgeon: Gatha Mayer, MD;  Location: [facility];  Service: Endoscopy;  Laterality: N/A;  . Venia Minks DILATION N/A 01/02/2013   Procedure: Venia Minks DILATION;  Surgeon: Gatha Mayer, MD;  [facility]: [facility];  Service: Endoscopy;  Laterality: N/A;  . Venia Minks DILATION N/A 05/24/2016   Procedure: Venia Minks DILATION;  Surgeon: Gatha Mayer, MD;  [facility]: [facility];  Service: Endoscopy;  Laterality: N/A;  . NOSE SURGERY     Biopsy  . OOPHORECTOMY    . SAVORY DILATION  09/14/2011   Procedure: SAVORY DILATION;  Surgeon: Gatha Mayer, MD;  [facility]: [facility];  Service: Endoscopy;  Laterality: N/A;  . SAVORY DILATION N/A 01/02/2013   Procedure: SAVORY DILATION;  Surgeon: Gatha Mayer, MD;  [facility]: [facility];  Service: Endoscopy;  Laterality: N/A;  . SAVORY DILATION N/A 04/12/2013   Procedure: SAVORY DILATION;  Surgeon: Gatha Mayer, MD;  [facility]: [facility];  Service: Endoscopy;  Laterality: N/A;  . skin cancer removal  02/2016   Family History  Problem Relation Age of Onset  . Lung cancer Father   . Esophageal cancer Son 6  . Stroke Mother        CVA  . Alzheimer's disease Mother   . Hypertension Brother   . Stomach cancer Son 53  . Breast cancer Maternal Aunt   . Colon cancer Neg Hx   . Rectal cancer Neg Hx    History  Sexual Activity  . Sexual activity: No    Outpatient Encounter Prescriptions as of 07/08/2016  Medication Sig  . acetaminophen (TYLENOL) 500 MG tablet Take 500 mg by mouth daily as needed for moderate pain or  headache.  . brimonidine (ALPHAGAN) 0.15 % ophthalmic solution Place 1 drop into both eyes 2 (two) times daily.   . fluticasone (FLONASE) 50 MCG/ACT nasal spray Place 2 sprays into both nostrils daily as needed for allergies.  Marland Kitchen lisinopril (PRINIVIL,ZESTRIL) 10 MG tablet Take 1 tablet (10 mg total) by mouth daily.  Marland Kitchen omeprazole (PRILOSEC) 40 MG capsule Take 1 capsule (40 mg total) by mouth daily.  Marland Kitchen oxybutynin (DITROPAN XL) 15 MG 24 hr tablet Take 1 tablet (15 mg total) by mouth daily.  . pravastatin (PRAVACHOL) 40 MG tablet Take 1 tablet (40 mg total) by mouth at bedtime.  . prednisoLONE acetate (OMNIPRED) 1 % ophthalmic suspension Place 1 drop into both eyes daily.   . silver sulfADIAZINE (SILVADENE) 1 % cream Apply 1 application topically daily. To burn on hand  . sodium chloride (OCEAN) 0.65 % SOLN nasal spray Place 1 spray into both nostrils as needed for congestion.  . timolol (BETIMOL) 0.5 % ophthalmic solution Place 1 drop into both eyes 2 (two) times daily.    No facility-administered encounter medications on file as of 07/08/2016.     Activities of Daily Living In your present state of health, do you have any difficulty performing the following activities: 07/08/2016  Hearing? Y  Vision? N  Difficulty concentrating or making decisions? N  Walking or climbing stairs? N  Dressing or bathing? N  Doing errands, shopping? N  Preparing Food and eating ? N  Using the Toilet? N  In the past six months, have you accidently leaked urine? Y  Do you have problems with loss of bowel control? N  Managing your Medications? N  Managing your Finances? N  Housekeeping or managing your Housekeeping? N  Some recent data might be hidden    Patient Care Team: Tower, Wynelle Fanny, MD as PCP - General Dingeldein, Remo Lipps, MD as Consulting Physician (Ophthalmology) Gatha Mayer, MD as Consulting Physician (Gastroenterology) Baxter Kail, MD as Consulting Physician (Dermatology)    Assessment:      Hearing Screening Comments: Bilateral hearing aids Vision Screening Comments: Last vision exam in April 2018 with Dr. Sandra Cockayne  Exercise Activities and Dietary recommendations Current Exercise Habits: The patient does not participate in regular exercise at present, Exercise limited by: None identified  Goals    . Eat more fruits and vegetables          Starting 07/08/2016, I will continue to increase my intake of fresh fruits and vegetables to at least 4 servings daily.       Fall Risk Fall Risk  07/08/2016 07/07/2015 12/13/2013 12/10/2012  Falls in the past year? No No No No   Depression Screen PHQ 2/9 Scores 07/08/2016 07/07/2015 12/13/2013 12/10/2012  PHQ - 2 Score 0 0  0 0     Cognitive Function MMSE - Mini Mental State Exam 07/08/2016 07/07/2015  Orientation to time 5 5  Orientation to Place 5 5  Registration 3 3  Attention/ Calculation 0 0  Recall 3 3  Language- name 2 objects 0 0  Language- repeat 1 1  Language- follow 3 step command 3 3  Language- read & follow direction 0 0  Write a sentence 0 0  Copy design 0 0  Total score 20 20     PLEASE NOTE: A Mini-Cog screen was completed. Maximum score is 20. A value of 0 denotes this part of Folstein MMSE was not completed or the patient failed this part of the Mini-Cog screening.   Mini-Cog Screening Orientation to Time - Max 5 pts Orientation to Place - Max 5 pts Registration - Max 3 pts Recall - Max 3 pts Language Repeat - Max 1 pts Language Follow 3 Step Command - Max 3 pts      Immunization History  Administered Date(s) Administered  . Influenza Split 12/03/2010  . Influenza Whole 12/23/2008  . Influenza,inj,Quad PF,36+ Mos 12/10/2012, 12/13/2013  . Pneumococcal Conjugate-13 12/13/2013  . Pneumococcal Polysaccharide-23 12/23/2008  . Td 02/28/1998, 06/03/2008   Screening Tests Health Maintenance  Topic Date Due  . MAMMOGRAM  07/16/2016  . INFLUENZA VACCINE  09/28/2016  . TETANUS/TDAP  06/04/2018  . DEXA  SCAN  Completed  . PNA vac Low Risk Adult  Completed      Plan:     I have personally reviewed and addressed the Medicare Annual Wellness questionnaire and have noted the following in the patient's chart:  A. Medical and social history B. Use of alcohol, tobacco or illicit drugs  C. Current medications and supplements D. Functional ability and status E.  Nutritional status F.  Physical activity G. Advance directives H. List of other physicians I.  Hospitalizations, surgeries, and ER visits in previous 12 months J.  [place] to include hearing, vision, cognitive, depression L. Referrals and appointments - none  In addition, I have reviewed and discussed with patient certain preventive protocols, quality metrics, and best practice recommendations. A written personalized care plan for preventive services as well as general preventive health recommendations were provided to patient.  See attached scanned questionnaire for additional information.   Signed,   Lindell Noe, MHA, BS, LPN Health Coach'

## 2016-07-08 NOTE — Progress Notes (Signed)
PCP notes:   Health maintenance:  Mammogram - addressed; pt has decided to stop having mammograms; encouraged pt to discuss with PCP at next appt  Abnormal screenings:   None  Patient concerns:   None  Nurse concerns:  None  Next PCP appt:   07/27/16 @ 1215

## 2016-07-08 NOTE — Progress Notes (Signed)
Pre visit review using our clinic review tool, if applicable. No additional management support is needed unless otherwise documented below in the visit note. 

## 2016-07-09 NOTE — Progress Notes (Signed)
I reviewed health advisor's note, was available for consultation, and agree with documentation and plan.  

## 2016-07-16 ENCOUNTER — Other Ambulatory Visit: Payer: Self-pay | Admitting: Family Medicine

## 2016-07-26 DIAGNOSIS — C44329 Squamous cell carcinoma of skin of other parts of face: Secondary | ICD-10-CM | POA: Diagnosis not present

## 2016-07-27 ENCOUNTER — Ambulatory Visit (INDEPENDENT_AMBULATORY_CARE_PROVIDER_SITE_OTHER): Payer: Medicare Other | Admitting: Family Medicine

## 2016-07-27 ENCOUNTER — Encounter: Payer: Self-pay | Admitting: Family Medicine

## 2016-07-27 VITALS — BP 106/62 | HR 60 | Temp 98.0°F | Ht 60.5 in | Wt 117.0 lb

## 2016-07-27 DIAGNOSIS — L129 Pemphigoid, unspecified: Secondary | ICD-10-CM

## 2016-07-27 DIAGNOSIS — I10 Essential (primary) hypertension: Secondary | ICD-10-CM | POA: Diagnosis not present

## 2016-07-27 DIAGNOSIS — T304 Corrosion of unspecified body region, unspecified degree: Secondary | ICD-10-CM

## 2016-07-27 DIAGNOSIS — E785 Hyperlipidemia, unspecified: Secondary | ICD-10-CM | POA: Diagnosis not present

## 2016-07-27 MED ORDER — PRAVASTATIN SODIUM 40 MG PO TABS: 40.0000 mg | ORAL_TABLET | Freq: Every day | ORAL | 3 refills | Status: DC

## 2016-07-27 MED ORDER — LISINOPRIL 10 MG PO TABS: 10.0000 mg | ORAL_TABLET | Freq: Every day | ORAL | 3 refills | Status: DC

## 2016-07-27 MED ORDER — OMEPRAZOLE 40 MG PO CPDR: 40.0000 mg | DELAYED_RELEASE_CAPSULE | Freq: Every day | ORAL | 3 refills | Status: DC

## 2016-07-27 MED ORDER — OXYBUTYNIN CHLORIDE ER 15 MG PO TB24: 15.0000 mg | ORAL_TABLET | Freq: Every day | ORAL | 3 refills | Status: DC

## 2016-07-27 NOTE — Assessment & Plan Note (Signed)
bp in fair control at this time  BP Readings from Last 1 Encounters:  07/27/16 106/62   No changes needed Disc lifstyle change with low sodium diet and exercise  Labs reviewed

## 2016-07-27 NOTE — Assessment & Plan Note (Signed)
Pt has trouble getting denture to stay due to loss of mouth/gum tissue  She is chopping foods and careful about swallowing

## 2016-07-27 NOTE — Assessment & Plan Note (Signed)
Disc goals for lipids and reasons to control them Rev labs with pt Rev low sat fat diet in detail Well controlled with pravastatin  Trig up -will watch sweets/carbs

## 2016-07-27 NOTE — Progress Notes (Signed)
Subjective:    Patient ID: Victoria Allison, female    DOB: 1940-07-18, 76 y.o.   MRN: [id number]  HPI Here for annual f/u of chronic health problems   Excited to turn 50 this year /had a good birthday   Had amw 5/11 No care gaps-pt had decided to stop having mammograms   Wt Readings from Last 3 Encounters:  07/27/16 117 lb (53.1 kg)  07/08/16 115 lb 8 oz (52.4 kg)  07/05/16 117 lb (53.1 kg)  stable/she is not as hungry and she has trouble chewing with oral pemphigoid  bmi 22.4  Mammogram 5/17 neg-she decided not to have further breast screening / she would not treat cancer if she had it  Self breast exams= no breast lumps   Colonoscopy 6/10 adenoma  Declines colon cancer screening   dexa 5/10-normal bmd  She declines further dexa tests No falls or fractures  Not taking ca and D    Zoster status -has had shingles in the past Unsure if interested in shingrix   bp is stable today  No cp or palpitations or headaches or edema  No side effects to medicines  BP Readings from Last 3 Encounters:  07/27/16 106/62  07/08/16 118/78  07/05/16 124/78     Hx of oral pemphigoid - progressive /no gum tissue left    Hx of hyperlipidemia Lab Results  Component Value Date   CHOL 169 07/08/2016   CHOL 182 07/14/2015   CHOL 191 12/06/2013   Lab Results  Component Value Date   HDL 50.20 07/08/2016   HDL 42.90 07/14/2015   HDL 45.20 12/06/2013   Lab Results  Component Value Date   LDLCALC 82 07/08/2016   LDLCALC 112 (H) 07/14/2015   LDLCALC 123 (H) 12/06/2013   Lab Results  Component Value Date   TRIG 181.0 (H) 07/08/2016   TRIG 133.0 07/14/2015   TRIG 115.0 12/06/2013   Lab Results  Component Value Date   CHOLHDL 3 07/08/2016   CHOLHDL 4 07/14/2015   CHOLHDL 4 12/06/2013   Lab Results  Component Value Date   LDLDIRECT 184.0 11/25/2010   LDLDIRECT 86.7 02/04/2009   Takes pravastatin and diet  LDL is well controlled  HDL is good Trig up a bit  Goes to  derm  Skin ca removed under nose and R forehead - watching her   Results for orders placed or performed in visit on 07/08/16  CBC with Differential/Platelet  Result Value Ref Range   WBC 6.0 4.0 - 10.5 K/uL   RBC 4.27 3.87 - 5.11 Mil/uL   Hemoglobin 12.6 12.0 - 15.0 g/dL   HCT 37.2 36.0 - 46.0 %   MCV 87.2 78.0 - 100.0 fl   MCHC 33.8 30.0 - 36.0 g/dL   RDW 13.4 11.5 - 15.5 %   Platelets 194.0 150.0 - 400.0 K/uL   Neutrophils Relative % 54.4 43.0 - 77.0 %   Lymphocytes Relative 36.1 12.0 - 46.0 %   Monocytes Relative 6.7 3.0 - 12.0 %   Eosinophils Relative 1.8 0.0 - 5.0 %   Basophils Relative 1.0 0.0 - 3.0 %   Neutro Abs 3.3 1.4 - 7.7 K/uL   Lymphs Abs 2.2 0.7 - 4.0 K/uL   Monocytes Absolute 0.4 0.1 - 1.0 K/uL   Eosinophils Absolute 0.1 0.0 - 0.7 K/uL   Basophils Absolute 0.1 0.0 - 0.1 K/uL  Comprehensive metabolic panel  Result Value Ref Range   Sodium 135 135 - 145 mEq/L  Potassium 4.5 3.5 - 5.1 mEq/L   Chloride 102 96 - 112 mEq/L   CO2 30 19 - 32 mEq/L   Glucose, Bld 99 70 - 99 mg/dL   BUN 9 6 - 23 mg/dL   Creatinine, Ser 0.69 0.40 - 1.20 mg/dL   Total Bilirubin 0.3 0.2 - 1.2 mg/dL   Alkaline Phosphatase 73 39 - 117 U/L   AST 15 0 - 37 U/L   ALT 15 0 - 35 U/L   Total Protein 6.7 6.0 - 8.3 g/dL   Albumin 3.8 3.5 - 5.2 g/dL   Calcium 9.2 8.4 - 10.5 mg/dL   GFR 87.47 >60.00 mL/min  Lipid panel  Result Value Ref Range   Cholesterol 169 0 - 200 mg/dL   Triglycerides 181.0 (H) 0.0 - 149.0 mg/dL   HDL 50.20 >39.00 mg/dL   VLDL 36.2 0.0 - 40.0 mg/dL   LDL Cholesterol 82 0 - 99 mg/dL   Total CHOL/HDL Ratio 3    NonHDL 118.61   TSH  Result Value Ref Range   TSH 3.63 0.35 - 4.50 uIU/mL    Patient Active Problem List   Diagnosis Date Noted  . Chemical burn 07/05/2016  . Cramp in muscle 04/22/2014  . Colon cancer screening 12/13/2013  . Esophageal dysphagia 04/12/2013  . Encounter for Medicare annual wellness exam 12/10/2012  . Esophageal rings 08/24/2011  .  SUPRAVENTRICULAR TACHYCARDIA 09/22/2009  . ARTHRITIS, SHOULDER 05/21/2009  . INSOMNIA 02/05/2009  . POSTMENOPAUSAL STATUS 06/02/2008  . PEMPHIGOID 11/14/2007  . Nonspecific (abnormal) findings on radiological and other examination of body structure 07/05/2007  . Hyperlipidemia LDL goal <100 03/13/2007  . Essential hypertension 03/13/2007  . CORONARY ARTERY DISEASE 03/13/2007  . Allergic rhinitis 03/13/2007  . GERD 03/13/2007  . HAIR LOSS 03/13/2007  . LEG PAIN, CHRONIC 03/13/2007  . URINARY INCONTINENCE 03/13/2007  . COLONIC POLYPS, HX OF 03/13/2007  . HX, URINARY INFECTION 08/14/2006   Past Medical History:  Diagnosis Date  . Allergic rhinitis   . CAD (coronary artery disease)   . Cancer ([place])    skin  . Chronic kidney disease    overactive bladder  . Colon polyp   . Diverticulosis    sigmoid and descending colon  . DM (diabetes mellitus) ([place])    no longer take any diabetic meds now that off Prednisone, type 2 diet controlled  . Elevated LFTs over one year ago  . Esophageal ring   . GERD (gastroesophageal reflux disease)   . History of hepatitis 1975   unsure of type  . Hyperlipemia   . Hypertension   . Internal hemorrhoids   . Lichen sclerosus 01/09   (DX BY GYN AFTER ABN PAP)  . Pemphigoid, cicatricial    D/O AFFECTING EYES, MOUTH, THROAT NOW (DX [facility])  . Shingles 03/13/2013  . Urinary incontinence    OVERACTIVE BLADDER   Past Surgical History:  Procedure Laterality Date  . ABDOMINAL HYSTERECTOMY    . ANGIOPLASTY  yrs ago   LAD  . CARDIAC ELECTROPHYSIOLOGY MAPPING AND ABLATION    . CATARACT EXTRACTION Bilateral   . COLONOSCOPY    . ESOPHAGOGASTRODUODENOSCOPY  09/14/2011   Procedure: ESOPHAGOGASTRODUODENOSCOPY (EGD);  Surgeon: Gatha Mayer, MD;  Location: [facility];  Service: Endoscopy;  Laterality: N/A;  needs xray  . ESOPHAGOGASTRODUODENOSCOPY N/A 01/02/2013   Procedure: ESOPHAGOGASTRODUODENOSCOPY (EGD);  Surgeon: Gatha Mayer, MD;  Location: [facility];  Service: Endoscopy;  Laterality: N/A;  . ESOPHAGOGASTRODUODENOSCOPY N/A 04/12/2013   Procedure: ESOPHAGOGASTRODUODENOSCOPY (  EGD);  Surgeon: Gatha Mayer, MD;  [facility];  Service: Endoscopy;  Laterality: N/A;  . ESOPHAGOGASTRODUODENOSCOPY (EGD) WITH PROPOFOL N/A 05/24/2016   Procedure: ESOPHAGOGASTRODUODENOSCOPY (EGD) WITH PROPOFOL;  Surgeon: Gatha Mayer, MD;  [facility];  Service: Endoscopy;  Laterality: N/A;  . Venia Minks DILATION N/A 01/02/2013   Procedure: Venia Minks DILATION;  Surgeon: Gatha Mayer, MD;  [facility];  Service: Endoscopy;  Laterality: N/A;  . Venia Minks DILATION N/A 05/24/2016   Procedure: Venia Minks DILATION;  Surgeon: Gatha Mayer, MD;  Location: [facility];  Service: Endoscopy;  Laterality: N/A;  . NOSE SURGERY     Biopsy  . OOPHORECTOMY    . SAVORY DILATION  09/14/2011   Procedure: SAVORY DILATION;  Surgeon: Gatha Mayer, MD;  [facility]: [facility];  Service: Endoscopy;  Laterality: N/A;  . SAVORY DILATION N/A 01/02/2013   Procedure: SAVORY DILATION;  Surgeon: Gatha Mayer, MD;  [facility]: [facility];  Service: Endoscopy;  Laterality: N/A;  . SAVORY DILATION N/A 04/12/2013   Procedure: SAVORY DILATION;  Surgeon: Gatha Mayer, MD;  Location: [facility];  Service: Endoscopy;  Laterality: N/A;  . skin cancer removal  02/2016   Social History  Substance Use Topics  . Smoking status: Former Smoker    Quit date: 02/29/1968  . Smokeless tobacco: Never Used  . Alcohol use No   Family History  Problem Relation Age of Onset  . Lung cancer Father   . Esophageal cancer Son 24  . Stroke Mother        CVA  . Alzheimer's disease Mother   . Hypertension Brother   . Stomach cancer Son 26  . Breast cancer Maternal Aunt   . Colon cancer Neg Hx   . Rectal cancer Neg Hx    Allergies  Allergen Reactions  . Aspirin     REACTION: bleeds too easily-  . Lipitor [Atorvastatin]     REACTION: elevated LFT's  . Ultram [Tramadol]      dizzy  . Adhesive  [Tape] Rash  . Latex Itching and Rash  . Tetanus Toxoid Rash   Current Outpatient Prescriptions on File Prior to Visit  Medication Sig Dispense Refill  . acetaminophen (TYLENOL) 500 MG tablet Take 500 mg by mouth daily as needed for moderate pain or headache.    . brimonidine (ALPHAGAN) 0.15 % ophthalmic solution Place 1 drop into both eyes 2 (two) times daily.     . fluticasone (FLONASE) 50 MCG/ACT nasal spray Place 2 sprays into both nostrils daily as needed for allergies.    . prednisoLONE acetate (OMNIPRED) 1 % ophthalmic suspension Place 1 drop into both eyes daily.     . sodium chloride (OCEAN) 0.65 % SOLN nasal spray Place 1 spray into both nostrils as needed for congestion.    . timolol (BETIMOL) 0.5 % ophthalmic solution Place 1 drop into both eyes 2 (two) times daily.      No current facility-administered medications on file prior to visit.     Review of Systems Review of Systems  Constitutional: Negative for fever, appetite change, fatigue and unexpected weight change.  Eyes: Negative for pain and visual disturbance.  Respiratory: Negative for cough and shortness of breath.   Cardiovascular: Negative for cp or palpitations    Gastrointestinal: Negative for nausea, diarrhea and constipation.  Genitourinary: Negative for urgency and frequency.  Skin: Negative for pallor or rash  pos for healing burn on L thumb Neurological: Negative for weakness, light-headedness, numbness and  headaches.  Hematological: Negative for adenopathy. Does not bruise/bleed easily.  Psychiatric/Behavioral: Negative for dysphoric mood. The patient is not nervous/anxious.         Objective:   Physical Exam  Constitutional: She appears well-developed and well-nourished. No distress.  Well appearing petite elderly female  HENT:  Head: Normocephalic and atraumatic.  Right Ear: External ear normal.  Left Ear: External ear normal.  Mouth/Throat: Oropharynx is clear and moist.    Eyes: Conjunctivae and EOM are normal. Pupils are equal, round, and reactive to light. No scleral icterus.  Neck: Normal range of motion. Neck supple. No JVD present. Carotid bruit is not present. No thyromegaly present.  Cardiovascular: Normal rate, regular rhythm, normal heart sounds and intact distal pulses.  Exam reveals no gallop.   Pulmonary/Chest: Effort normal and breath sounds normal. No respiratory distress. She has no wheezes. She exhibits no tenderness.  Abdominal: Soft. Bowel sounds are normal. She exhibits no distension, no abdominal bruit and no mass. There is no tenderness.  Genitourinary: No breast swelling, tenderness, discharge or bleeding.  Genitourinary Comments: Breast exam: No mass, nodules, thickening, tenderness, bulging, retraction, inflamation, nipple discharge or skin changes noted.  No axillary or clavicular LA.      Musculoskeletal: Normal range of motion. She exhibits no edema or tenderness.  Lymphadenopathy:    She has no cervical adenopathy.  Neurological: She is alert. She has normal reflexes. No cranial nerve deficit. She exhibits normal muscle tone. Coordination normal.  Skin: Skin is warm and dry. No rash noted. No erythema. No pallor.  Solar lentigines diffusely  Some sks   L prox thumb- chemical burn area well healed without skin interruption  Pink in color   Psychiatric: She has a normal mood and affect.  Talkative Mentally sharp           Assessment & Plan:   Problem List Items Addressed This Visit      Cardiovascular and Mediastinum   Essential hypertension - Primary    bp in fair control at this time  BP Readings from Last 1 Encounters:  07/27/16 106/62   No changes needed Disc lifstyle change with low sodium diet and exercise  Labs reviewed       Relevant Medications   lisinopril (PRINIVIL,ZESTRIL) 10 MG tablet   pravastatin (PRAVACHOL) 40 MG tablet     Musculoskeletal and Integument   PEMPHIGOID    Pt has trouble getting  denture to stay due to loss of mouth/gum tissue  She is chopping foods and careful about swallowing        Other   Chemical burn    Much improved - will continue to keep clean      Hyperlipidemia LDL goal <100    Disc goals for lipids and reasons to control them Rev labs with pt Rev low sat fat diet in detail Well controlled with pravastatin  Trig up -will watch sweets/carbs      Relevant Medications   lisinopril (PRINIVIL,ZESTRIL) 10 MG tablet   pravastatin (PRAVACHOL) 40 MG tablet

## 2016-07-27 NOTE — Patient Instructions (Addendum)
Try to get 1200-1500 mg of calcium per day with at least 1000 iu of vitamin D - for bone health   If you are interested in the new shingles vaccine (Shingrix) - call your insurance to check on coverage,( you should not get it within 1 month of other vaccines) , then call us for a prescription  for it to take to a pharmacy that gives the shot , or make a nurse visit to get it here depending on your coverage  Your burn looks much better  It is ok to get it wet Keep it clean with soap and water   Stay active and eat a healthy diet   Get outdoors as much as you can but don't get sunburned  Socialize - it is good for your memory

## 2016-07-27 NOTE — Assessment & Plan Note (Signed)
Much improved - will continue to keep clean

## 2016-08-17 DIAGNOSIS — C44329 Squamous cell carcinoma of skin of other parts of face: Secondary | ICD-10-CM | POA: Diagnosis not present

## 2016-09-09 NOTE — Addendum Note (Signed)
Addendum  created 09/09/16 1350 by Savon Cobbs, MD   Sign clinical note    

## 2016-09-09 NOTE — Anesthesia Postprocedure Evaluation (Signed)
Anesthesia Post Note  Patient: Victoria Allison  Procedure(s) Performed: Procedure(s) (LRB): ESOPHAGOGASTRODUODENOSCOPY (EGD) WITH PROPOFOL (N/A) MALONEY DILATION (N/A)     Anesthesia Post Evaluation  Last Vitals:  Vitals:   05/24/16 0943 05/24/16 1000  BP: (!) 129/46 (!) 147/59  Pulse: 83 69  Resp: 19 15  Temp: 36.7 C     Last Pain:  Vitals:   05/25/16 1341  TempSrc:   PainSc: 0-No pain                 Kayle Correa EDWARD

## 2017-01-12 DIAGNOSIS — H40003 Preglaucoma, unspecified, bilateral: Secondary | ICD-10-CM | POA: Diagnosis not present

## 2017-01-12 DIAGNOSIS — L121 Cicatricial pemphigoid: Secondary | ICD-10-CM | POA: Diagnosis not present

## 2017-01-27 DIAGNOSIS — Z85828 Personal history of other malignant neoplasm of skin: Secondary | ICD-10-CM | POA: Diagnosis not present

## 2017-01-27 DIAGNOSIS — L570 Actinic keratosis: Secondary | ICD-10-CM | POA: Diagnosis not present

## 2017-01-27 DIAGNOSIS — L821 Other seborrheic keratosis: Secondary | ICD-10-CM | POA: Diagnosis not present

## 2017-01-27 DIAGNOSIS — L121 Cicatricial pemphigoid: Secondary | ICD-10-CM | POA: Diagnosis not present

## 2017-06-23 DIAGNOSIS — L309 Dermatitis, unspecified: Secondary | ICD-10-CM | POA: Diagnosis not present

## 2017-06-23 DIAGNOSIS — A46 Erysipelas: Secondary | ICD-10-CM | POA: Diagnosis not present

## 2017-06-23 DIAGNOSIS — L121 Cicatricial pemphigoid: Secondary | ICD-10-CM | POA: Diagnosis not present

## 2017-07-09 ENCOUNTER — Telehealth: Payer: Self-pay | Admitting: Family Medicine

## 2017-07-09 DIAGNOSIS — I10 Essential (primary) hypertension: Secondary | ICD-10-CM

## 2017-07-09 DIAGNOSIS — E785 Hyperlipidemia, unspecified: Secondary | ICD-10-CM

## 2017-07-09 NOTE — Telephone Encounter (Signed)
-----   Message from Eustace Pen, LPN sent at [phone]/37/7939  4:58 PM EDT ----- Regarding: Labs 5/13 Lab orders needed. Thank you.  Insurance:  [employer]

## 2017-07-12 ENCOUNTER — Ambulatory Visit (INDEPENDENT_AMBULATORY_CARE_PROVIDER_SITE_OTHER): Payer: Medicare Other

## 2017-07-12 VITALS — BP 124/70 | HR 57 | Temp 97.7°F | Ht 60.25 in | Wt 121.8 lb

## 2017-07-12 DIAGNOSIS — I10 Essential (primary) hypertension: Secondary | ICD-10-CM | POA: Diagnosis not present

## 2017-07-12 DIAGNOSIS — E785 Hyperlipidemia, unspecified: Secondary | ICD-10-CM | POA: Diagnosis not present

## 2017-07-12 DIAGNOSIS — Z0000 Encounter for general adult medical examination without abnormal findings: Secondary | ICD-10-CM | POA: Diagnosis not present

## 2017-07-12 LAB — COMPREHENSIVE METABOLIC PANEL
ALT: 17 U/L (ref 0–35)
AST: 17 U/L (ref 0–37)
Albumin: 3.7 g/dL (ref 3.5–5.2)
Alkaline Phosphatase: 76 U/L (ref 39–117)
BUN: 11 mg/dL (ref 6–23)
CO2: 27 mEq/L (ref 19–32)
Calcium: 9.2 mg/dL (ref 8.4–10.5)
Chloride: 103 mEq/L (ref 96–112)
Creatinine, Ser: 0.69 mg/dL (ref 0.40–1.20)
GFR: 87.24 mL/min (ref >60.00)
Glucose, Bld: 106 mg/dL — ABNORMAL HIGH (ref 70–99)
Potassium: 4.3 mEq/L (ref 3.5–5.1)
Sodium: 136 mEq/L (ref 135–145)
Total Bilirubin: 0.3 mg/dL (ref 0.2–1.2)
Total Protein: 6.8 g/dL (ref 6.0–8.3)

## 2017-07-12 LAB — CBC WITH DIFFERENTIAL/PLATELET
Basophils Absolute: 0 10*3/uL (ref 0.0–0.1)
Basophils Relative: 0.9 % (ref 0.0–3.0)
Eosinophils Absolute: 0.1 10*3/uL (ref 0.0–0.7)
Eosinophils Relative: 2.3 % (ref 0.0–5.0)
HCT: 35.5 % — ABNORMAL LOW (ref 36.0–46.0)
Hemoglobin: 11.8 g/dL — ABNORMAL LOW (ref 12.0–15.0)
Lymphocytes Relative: 35.2 % (ref 12.0–46.0)
Lymphs Abs: 2 10*3/uL (ref 0.7–4.0)
MCHC: 33.3 g/dL (ref 30.0–36.0)
MCV: 84.7 fl (ref 78.0–100.0)
Monocytes Absolute: 0.4 10*3/uL (ref 0.1–1.0)
Monocytes Relative: 6.9 % (ref 3.0–12.0)
Neutro Abs: 3.1 10*3/uL (ref 1.4–7.7)
Neutrophils Relative %: 54.7 % (ref 43.0–77.0)
Platelets: 180 10*3/uL (ref 150.0–400.0)
RBC: 4.19 Mil/uL (ref 3.87–5.11)
RDW: 14.1 % (ref 11.5–15.5)
WBC: 5.6 10*3/uL (ref 4.0–10.5)

## 2017-07-12 LAB — LIPID PANEL
Cholesterol: 184 mg/dL (ref 0–200)
HDL: 50.5 mg/dL (ref >39.00)
LDL Cholesterol: 101 mg/dL — ABNORMAL HIGH (ref 0–99)
NonHDL: 133.41
Total CHOL/HDL Ratio: 4
Triglycerides: 161 mg/dL — ABNORMAL HIGH (ref 0.0–149.0)
VLDL: 32.2 mg/dL (ref 0.0–40.0)

## 2017-07-12 LAB — TSH: TSH: 3.33 u[IU]/mL (ref 0.35–4.50)

## 2017-07-12 NOTE — Patient Instructions (Signed)
Ms. Bucio , Thank you for taking time to come for your Medicare Wellness Visit. I appreciate your ongoing commitment to your health goals. Please review the following plan we discussed and let me know if I can assist you in the future.   These are the goals we discussed: Goals    . Eat more fruits and vegetables     Starting 07/12/2017, I will continue to increase my intake of fresh fruits and vegetables to at least 4 servings daily.        This is a list of the screening recommended for you and due dates:  Health Maintenance  Topic Date Due  . Mammogram  04/02/2023*  . Flu Shot  09/28/2017  . Tetanus Vaccine  06/04/2018  . DEXA scan (bone density measurement)  Completed  . Pneumonia vaccines  Completed  *Topic was postponed. The date shown is not the original due date.   Preventive Care for Adults  A healthy lifestyle and preventive care can promote health and wellness. Preventive health guidelines for adults include the following key practices.  . A routine yearly physical is a good way to check with your health care provider about your health and preventive screening. It is a chance to share any concerns and updates on your health and to receive a thorough exam.  . Visit your dentist for a routine exam and preventive care every 6 months. Brush your teeth twice a day and floss once a day. Good oral hygiene prevents tooth decay and gum disease.  . The frequency of eye exams is based on your age, health, family medical history, use  of contact lenses, and other factors. Follow your health care provider's recommendations for frequency of eye exams.  . Eat a healthy diet. Foods like vegetables, fruits, whole grains, low-fat dairy products, and lean protein foods contain the nutrients you need without too many calories. Decrease your intake of foods high in solid fats, added sugars, and salt. Eat the right amount of calories for you. Get information about a proper diet from your health  care provider, if necessary.  . Regular physical exercise is one of the most important things you can do for your health. Most adults should get at least 150 minutes of moderate-intensity exercise (any activity that increases your heart rate and causes you to sweat) each week. In addition, most adults need muscle-strengthening exercises on 2 or more days a week.  Silver Sneakers may be a benefit available to you. To determine eligibility, you may visit the website: www.silversneakers.com or contact program at (216)479-4796 Mon-Fri between 8AM-8PM.   . Maintain a healthy weight. The body mass index (BMI) is a screening tool to identify possible weight problems. It provides an estimate of body fat based on height and weight. Your health care provider can find your BMI and can help you achieve or maintain a healthy weight.   For adults 20 years and older: ? A BMI below 18.5 is considered underweight. ? A BMI of 18.5 to 24.9 is normal. ? A BMI of 25 to 29.9 is considered overweight. ? A BMI of 30 and above is considered obese.   . Maintain normal blood lipids and cholesterol levels by exercising and minimizing your intake of saturated fat. Eat a balanced diet with plenty of fruit and vegetables. Blood tests for lipids and cholesterol should begin at age 47 and be repeated every 5 years. If your lipid or cholesterol levels are high, you are over 50, or you  are at high risk for heart disease, you may need your cholesterol levels checked more frequently. Ongoing high lipid and cholesterol levels should be treated with medicines if diet and exercise are not working.  . If you smoke, find out from your health care provider how to quit. If you do not use tobacco, please do not start.  . If you choose to drink alcohol, please do not consume more than 2 drinks per day. One drink is considered to be 12 ounces (355 mL) of beer, 5 ounces (148 mL) of wine, or 1.5 ounces (44 mL) of liquor.  . If you are 74-51  years old, ask your health care provider if you should take aspirin to prevent strokes.  . Use sunscreen. Apply sunscreen liberally and repeatedly throughout the day. You should seek shade when your shadow is shorter than you. Protect yourself by wearing long sleeves, pants, a wide-brimmed hat, and sunglasses year round, whenever you are outdoors.  . Once a month, do a whole body skin exam, using a mirror to look at the skin on your back. Tell your health care provider of new moles, moles that have irregular borders, moles that are larger than a pencil eraser, or moles that have changed in shape or color.

## 2017-07-12 NOTE — Progress Notes (Signed)
PCP notes:   Health maintenance:  No gaps identified.  Abnormal screenings:   None  Patient concerns:   None  Nurse concerns:  None  Next PCP appt:   07/19/17 @ 1030

## 2017-07-12 NOTE — Progress Notes (Signed)
Subjective:   Victoria Allison is a 77 y.o. female who presents for Medicare Annual (Subsequent) preventive examination.  Review of Systems:  N/A Cardiac Risk Factors include: advanced age (>43men, >66 women);dyslipidemia     Objective:     Vitals: BP 124/70 (BP Location: Right Arm, Patient Position: Sitting, Cuff Size: Normal)   Pulse (!) 57   Temp 97.7 F (36.5 C) (Oral)   Ht 5' 0.25" (1.53 m) Comment: no shoes  Wt 121 lb 12 oz (55.2 kg)   LMP 02/29/1968   SpO2 100%   BMI 23.58 kg/m   Body mass index is 23.58 kg/m.  Advanced Directives 07/12/2017 07/08/2016 05/24/2016 05/19/2016 07/07/2015 04/12/2013 01/02/2013  Does Patient Have a Medical Advance Directive? Yes Yes Yes Yes Yes Patient has advance directive, copy not in chart Patient has advance directive, copy not in chart  Type of Advance Directive [place];Living will [place];Living will [place];Living will [place];Living will [place];Living will - Living will  Does patient want to make changes to medical advance directive? - - - No - Patient declined - - -  Copy of [place] in Chart? No - copy requested No - copy requested - Yes - - -  Pre-existing out of facility DNR order (yellow form or pink MOST form) - - - - - - -    Tobacco Social History   Tobacco Use  Smoking Status Former Smoker  . Last attempt to quit: 02/29/1968  . Years since quitting: 49.4  Smokeless Tobacco Never Used     Counseling given: No   Clinical Intake:  Pre-visit preparation completed: Yes  Pain : No/denies pain Pain Score: 0-No pain     Nutritional Status: BMI of 19-24  Normal Nutritional Risks: None Diabetes: No  How often do you need to have someone help you when you read instructions, pamphlets, or other written materials from your doctor or pharmacy?: 1 - Never What is the last grade level you completed in  school?: 11th grade  Interpreter Needed?: No  Comments: pt lives with spouse Information entered by :: LPinson  Past Medical History:  Diagnosis Date  . Allergic rhinitis   . CAD (coronary artery disease)   . Cancer ([place])    skin  . Chronic kidney disease    overactive bladder  . Colon polyp   . Diverticulosis    sigmoid and descending colon  . DM (diabetes mellitus) ([place])    no longer take any diabetic meds now that off Prednisone, type 2 diet controlled  . Elevated LFTs over one year ago  . Esophageal ring   . GERD (gastroesophageal reflux disease)   . History of hepatitis 1975   unsure of type  . Hyperlipemia   . Hypertension   . Internal hemorrhoids   . Lichen sclerosus 39/76   (DX BY GYN AFTER ABN PAP)  . Pemphigoid, cicatricial    D/O AFFECTING EYES, MOUTH, THROAT NOW (DX [facility])  . Shingles 03/13/2013  . Urinary incontinence    OVERACTIVE BLADDER   Past Surgical History:  Procedure Laterality Date  . ABDOMINAL HYSTERECTOMY    . ANGIOPLASTY  yrs ago   LAD  . CARDIAC ELECTROPHYSIOLOGY MAPPING AND ABLATION    . CATARACT EXTRACTION Bilateral   . COLONOSCOPY    . ESOPHAGOGASTRODUODENOSCOPY  09/14/2011   Procedure: ESOPHAGOGASTRODUODENOSCOPY (EGD);  Surgeon: Gatha Mayer, MD;  Location: [facility];  Service:  Endoscopy;  Laterality: N/A;  needs xray  . ESOPHAGOGASTRODUODENOSCOPY N/A 01/02/2013   Procedure: ESOPHAGOGASTRODUODENOSCOPY (EGD);  Surgeon: Gatha Mayer, MD;  Location: [facility];  Service: Endoscopy;  Laterality: N/A;  . ESOPHAGOGASTRODUODENOSCOPY N/A 04/12/2013   Procedure: ESOPHAGOGASTRODUODENOSCOPY (EGD);  Surgeon: Gatha Mayer, MD;  Location: [facility];  Service: Endoscopy;  Laterality: N/A;  . ESOPHAGOGASTRODUODENOSCOPY (EGD) WITH PROPOFOL N/A 05/24/2016   Procedure: ESOPHAGOGASTRODUODENOSCOPY (EGD) WITH PROPOFOL;  Surgeon: Gatha Mayer, MD;  Location: [facility];  Service: Endoscopy;  Laterality: N/A;  . Venia Minks DILATION N/A 01/02/2013    Procedure: Venia Minks DILATION;  Surgeon: Gatha Mayer, MD;  Location: [facility];  Service: Endoscopy;  Laterality: N/A;  . Venia Minks DILATION N/A 05/24/2016   Procedure: Venia Minks DILATION;  Surgeon: Gatha Mayer, MD;  Location: [facility];  Service: Endoscopy;  Laterality: N/A;  . NOSE SURGERY     Biopsy  . OOPHORECTOMY    . SAVORY DILATION  09/14/2011   Procedure: SAVORY DILATION;  Surgeon: Gatha Mayer, MD;  Location: [facility];  Service: Endoscopy;  Laterality: N/A;  . SAVORY DILATION N/A 01/02/2013   Procedure: SAVORY DILATION;  Surgeon: Gatha Mayer, MD;  Location: [facility];  Service: Endoscopy;  Laterality: N/A;  . SAVORY DILATION N/A 04/12/2013   Procedure: SAVORY DILATION;  Surgeon: Gatha Mayer, MD;  Location: [facility];  Service: Endoscopy;  Laterality: N/A;  . skin cancer removal  02/2016   Family History  Problem Relation Age of Onset  . Lung cancer Father   . Esophageal cancer Son 4  . Stroke Mother        CVA  . Alzheimer's disease Mother   . Hypertension Brother   . Stomach cancer Son 66  . Breast cancer Maternal Aunt   . Colon cancer Neg Hx   . Rectal cancer Neg Hx    Social History   Socioeconomic History  . Marital status: Married    Spouse name: Not on file  . Number of children: 2  . Years of education: Not on file  . Highest education level: Not on file  Occupational History  . Occupation: Retired    Fish farm manager: RETIRED  Social Needs  . Financial resource strain: Not on file  . Food insecurity:    Worry: Not on file    Inability: Not on file  . Transportation needs:    Medical: Not on file    Non-medical: Not on file  Tobacco Use  . Smoking status: Former Smoker    Last attempt to quit: 02/29/1968    Years since quitting: 49.4  . Smokeless tobacco: Never Used  Substance and Sexual Activity  . Alcohol use: No    Alcohol/week: 0.0 oz  . Drug use: No  . Sexual activity: Never  Lifestyle  . Physical activity:    Days per week:  Not on file    Minutes per session: Not on file  . Stress: Not on file  Relationships  . Social connections:    Talks on phone: Not on file    Gets together: Not on file    Attends religious service: Not on file    Active member of club or organization: Not on file    Attends meetings of clubs or organizations: Not on file    Relationship status: Not on file  Other Topics Concern  . Not on file  Social History Narrative   Husband is fighting cancer. Exercises on stationary bike/walks   Daily caffeine  Outpatient Encounter Medications as of 07/12/2017  Medication Sig  . acetaminophen (TYLENOL) 500 MG tablet Take 500 mg by mouth daily as needed for moderate pain or headache.  . brimonidine (ALPHAGAN) 0.15 % ophthalmic solution Place 1 drop into both eyes 2 (two) times daily.   . fluticasone (FLONASE) 50 MCG/ACT nasal spray Place 2 sprays into both nostrils daily as needed for allergies.  Marland Kitchen lisinopril (PRINIVIL,ZESTRIL) 10 MG tablet Take 1 tablet (10 mg total) by mouth daily.  Marland Kitchen omeprazole (PRILOSEC) 40 MG capsule Take 1 capsule (40 mg total) by mouth daily.  Marland Kitchen oxybutynin (DITROPAN XL) 15 MG 24 hr tablet Take 1 tablet (15 mg total) by mouth daily.  . pravastatin (PRAVACHOL) 40 MG tablet Take 1 tablet (40 mg total) by mouth daily.  . prednisoLONE acetate (OMNIPRED) 1 % ophthalmic suspension Place 1 drop into both eyes daily.   . sodium chloride (OCEAN) 0.65 % SOLN nasal spray Place 1 spray into both nostrils as needed for congestion.  . timolol (BETIMOL) 0.5 % ophthalmic solution Place 1 drop into both eyes 2 (two) times daily.    No facility-administered encounter medications on file as of 07/12/2017.     Activities of Daily Living In your present state of health, do you have any difficulty performing the following activities: 07/12/2017  Hearing? Y  Vision? N  Difficulty concentrating or making decisions? N  Walking or climbing stairs? N  Dressing or bathing? N  Doing errands,  shopping? N  Preparing Food and eating ? N  Using the Toilet? N  In the past six months, have you accidently leaked urine? N  Do you have problems with loss of bowel control? N  Managing your Medications? N  Managing your Finances? N  Housekeeping or managing your Housekeeping? N  Some recent data might be hidden    Patient Care Team: Tower, Wynelle Fanny, MD as PCP - General Dingeldein, Remo Lipps, MD as Consulting Physician (Ophthalmology) Gatha Mayer, MD as Consulting Physician (Gastroenterology) Baxter Kail, MD as Consulting Physician (Dermatology)    Assessment:   This is a routine wellness examination for [place].  Hearing Screening Comments: Bilateral hearing aids Vision Screening Comments: Eye exam every 3 mths; future appt scheduled 07/13/17    Exercise Activities and Dietary recommendations Current Exercise Habits: Home exercise routine, Type of exercise: Other - see comments(stationary bike), Time (Minutes): 30, Frequency (Times/Week): 7, Weekly Exercise (Minutes/Week): 210, Intensity: Mild, Exercise limited by: None identified  Goals    . Eat more fruits and vegetables     Starting 07/12/2017, I will continue to increase my intake of fresh fruits and vegetables to at least 4 servings daily.        Fall Risk Fall Risk  07/12/2017 07/08/2016 07/07/2015 12/13/2013 12/10/2012  Falls in the past year? No No No No No   Depression Screen PHQ 2/9 Scores 07/12/2017 07/08/2016 07/07/2015 12/13/2013  PHQ - 2 Score 0 0 0 0  PHQ- 9 Score 0 - - -     Cognitive Function MMSE - Mini Mental State Exam 07/12/2017 07/08/2016 07/07/2015  Orientation to time 5 5 5   Orientation to Place 5 5 5   Registration 3 3 3   Attention/ Calculation 0 0 0  Recall 3 3 3   Language- name 2 objects 0 0 0  Language- repeat 1 1 1   Language- follow 3 step command 3 3 3   Language- read & follow direction 0 0 0  Write a sentence 0 0 0  Copy design  0 0 0  Total score 20 20 20      PLEASE NOTE: A Mini-Cog screen  was completed. Maximum score is 20. A value of 0 denotes this part of [place] MMSE was not completed or the patient failed this part of the Mini-Cog screening.   Mini-Cog Screening Orientation to Time - Max 5 pts Orientation to Place - Max 5 pts Registration - Max 3 pts Recall - Max 3 pts Language Repeat - Max 1 pts Language Follow 3 Step Command - Max 3 pts     Immunization History  Administered Date(s) Administered  . Influenza Split 12/03/2010  . Influenza Whole 12/23/2008  . Influenza,inj,Quad PF,6+ Mos 12/10/2012, 12/13/2013  . Pneumococcal Conjugate-13 12/13/2013  . Pneumococcal Polysaccharide-23 12/23/2008  . Td 02/28/1998, 06/03/2008    Screening Tests Health Maintenance  Topic Date Due  . MAMMOGRAM  04/02/2023 (Originally 07/16/2016)  . INFLUENZA VACCINE  09/28/2017  . TETANUS/TDAP  06/04/2018  . DEXA SCAN  Completed  . PNA vac Low Risk Adult  Completed        Plan:     I have personally reviewed, addressed, and noted the following in the patient's chart:  A. Medical and social history B. Use of alcohol, tobacco or illicit drugs  C. Current medications and supplements D. Functional ability and status E.  Nutritional status F.  Physical activity G. Advance directives H. List of other physicians I.  Hospitalizations, surgeries, and ER visits in previous 12 months J.  [place] to include hearing, vision, cognitive, depression L. Referrals and appointments - none  In addition, I have reviewed and discussed with patient certain preventive protocols, quality metrics, and best practice recommendations. A written personalized care plan for preventive services as well as general preventive health recommendations were provided to patient.  See attached scanned questionnaire for additional information.   Signed,   Lindell Noe, MHA, BS, LPN Health Coach

## 2017-07-13 DIAGNOSIS — H4063X0 Glaucoma secondary to drugs, bilateral, stage unspecified: Secondary | ICD-10-CM | POA: Diagnosis not present

## 2017-07-13 NOTE — Progress Notes (Signed)
I reviewed health advisor's note, was available for consultation, and agree with documentation and plan.  

## 2017-07-19 ENCOUNTER — Encounter: Payer: Self-pay | Admitting: Family Medicine

## 2017-07-19 ENCOUNTER — Ambulatory Visit (INDEPENDENT_AMBULATORY_CARE_PROVIDER_SITE_OTHER): Payer: Medicare Other | Admitting: Family Medicine

## 2017-07-19 VITALS — BP 122/60 | HR 74 | Temp 98.1°F | Ht 60.25 in | Wt 120.8 lb

## 2017-07-19 DIAGNOSIS — K219 Gastro-esophageal reflux disease without esophagitis: Secondary | ICD-10-CM

## 2017-07-19 DIAGNOSIS — R1310 Dysphagia, unspecified: Secondary | ICD-10-CM | POA: Diagnosis not present

## 2017-07-19 DIAGNOSIS — I10 Essential (primary) hypertension: Secondary | ICD-10-CM

## 2017-07-19 DIAGNOSIS — L129 Pemphigoid, unspecified: Secondary | ICD-10-CM

## 2017-07-19 DIAGNOSIS — R7309 Other abnormal glucose: Secondary | ICD-10-CM

## 2017-07-19 DIAGNOSIS — J302 Other seasonal allergic rhinitis: Secondary | ICD-10-CM

## 2017-07-19 DIAGNOSIS — R1319 Other dysphagia: Secondary | ICD-10-CM

## 2017-07-19 DIAGNOSIS — R194 Change in bowel habit: Secondary | ICD-10-CM

## 2017-07-19 DIAGNOSIS — Z1231 Encounter for screening mammogram for malignant neoplasm of breast: Secondary | ICD-10-CM | POA: Diagnosis not present

## 2017-07-19 DIAGNOSIS — E785 Hyperlipidemia, unspecified: Secondary | ICD-10-CM

## 2017-07-19 MED ORDER — OMEPRAZOLE 40 MG PO CPDR: 40.0000 mg | DELAYED_RELEASE_CAPSULE | Freq: Every day | ORAL | 3 refills | Status: DC

## 2017-07-19 MED ORDER — PRAVASTATIN SODIUM 40 MG PO TABS: 40.0000 mg | ORAL_TABLET | Freq: Every day | ORAL | 3 refills | Status: DC

## 2017-07-19 MED ORDER — FLUTICASONE PROPIONATE 50 MCG/ACT NA SUSP: 2.0000 | Freq: Every day | NASAL | 11 refills | Status: AC | PRN

## 2017-07-19 MED ORDER — OXYBUTYNIN CHLORIDE ER 15 MG PO TB24: 15.0000 mg | ORAL_TABLET | Freq: Every day | ORAL | 3 refills | Status: DC

## 2017-07-19 MED ORDER — LISINOPRIL 10 MG PO TABS: 10.0000 mg | ORAL_TABLET | Freq: Every day | ORAL | 3 refills | Status: DC

## 2017-07-19 NOTE — Assessment & Plan Note (Signed)
Continue ppi  F/u with GI for dysphagia

## 2017-07-19 NOTE — Assessment & Plan Note (Signed)
Disc goals for lipids and reasons to control them Rev last labs with pt Rev low sat fat diet in detail LDL up with ice cream  Continue pravastatin  Better diet

## 2017-07-19 NOTE — Assessment & Plan Note (Signed)
With hx of esophageal rings and dilatation  Also hx of oral pemphigoid  Sees dr Carlean Purl Ref done

## 2017-07-19 NOTE — Progress Notes (Signed)
Subjective:    Patient ID: Victoria Allison, female    DOB: 1940-12-18, 77 y.o.   MRN: [id number]  HPI  Here for annual f/u of chronic medical problems   Having more issues with bm  Loose stool with pain before hand and has to push to move bowels  Going on for 2-3 mo   Every once in a while she had pain in her L neck -posterior  Comes and goes  Massage helps    Wt Readings from Last 3 Encounters:  07/19/17 120 lb 12 oz (54.8 kg)  07/12/17 121 lb 12 oz (55.2 kg)  07/27/16 117 lb (53.1 kg)  stable weight  23.39 kg/m   Had amw on 5/15 No gaps or concerns   Colonoscopy 6/10-polyp Given - symptoms would like to see  Also has swallowing issues  Needs fu with GI   Mammogram 5/17- is ok with doing another one  [facility]  Self breast exam   dexa 5/10  Declines further  No falls or fractures   Zoster status -not interested in vaccine   Pemphigoid /oral is worse  She has further receeding of gums  Still sees derm No longer on any tx  Has also had a skin cancer removed    bp is stable today  No cp or palpitations or headaches or edema  No side effects to medicines  BP Readings from Last 3 Encounters:  07/19/17 122/60  07/12/17 124/70  07/27/16 106/62    Lisinopril   Hyperlipidemia Lab Results  Component Value Date   CHOL 184 07/12/2017   CHOL 169 07/08/2016   CHOL 182 07/14/2015   Lab Results  Component Value Date   HDL 50.50 07/12/2017   HDL 50.20 07/08/2016   HDL 42.90 07/14/2015   Lab Results  Component Value Date   LDLCALC 101 (H) 07/12/2017   LDLCALC 82 07/08/2016   LDLCALC 112 (H) 07/14/2015   Lab Results  Component Value Date   TRIG 161.0 (H) 07/12/2017   TRIG 181.0 (H) 07/08/2016   TRIG 133.0 07/14/2015   Lab Results  Component Value Date   CHOLHDL 4 07/12/2017   CHOLHDL 3 07/08/2016   CHOLHDL 4 07/14/2015   Lab Results  Component Value Date   LDLDIRECT 184.0 11/25/2010   LDLDIRECT 86.7 02/04/2009   Pravastatin  and diet  CAD  Glucose is 106  Ate ice cream quite a bit for a while  Stopped it a week ago   Caffeine - trying to cut back   Lab Results  Component Value Date   WBC 5.6 07/12/2017   HGB 11.8 (L) 07/12/2017   HCT 35.5 (L) 07/12/2017   MCV 84.7 07/12/2017   PLT 180.0 07/12/2017   down a little  Will get to GI  Lab Results  Component Value Date   CREATININE 0.69 07/12/2017   BUN 11 07/12/2017   NA 136 07/12/2017   K 4.3 07/12/2017   CL 103 07/12/2017   CO2 27 07/12/2017   Lab Results  Component Value Date   ALT 17 07/12/2017   AST 17 07/12/2017   ALKPHOS 76 07/12/2017   BILITOT 0.3 07/12/2017    Lab Results  Component Value Date   TSH 3.33 07/12/2017     Patient Active Problem List   Diagnosis Date Noted  . Bowel habit changes 07/19/2017  . Screening mammogram, encounter for 07/19/2017  . Elevated glucose 07/19/2017  . Cramp in muscle 04/22/2014  . Colon cancer screening  12/13/2013  . Dysphagia 04/12/2013  . Encounter for Medicare annual wellness exam 12/10/2012  . Esophageal rings 08/24/2011  . SUPRAVENTRICULAR TACHYCARDIA 09/22/2009  . ARTHRITIS, SHOULDER 05/21/2009  . INSOMNIA 02/05/2009  . POSTMENOPAUSAL STATUS 06/02/2008  . PEMPHIGOID 11/14/2007  . Nonspecific (abnormal) findings on radiological and other examination of body structure 07/05/2007  . Hyperlipidemia LDL goal <100 03/13/2007  . Essential hypertension 03/13/2007  . CORONARY ARTERY DISEASE 03/13/2007  . Allergic rhinitis 03/13/2007  . GERD 03/13/2007  . HAIR LOSS 03/13/2007  . LEG PAIN, CHRONIC 03/13/2007  . URINARY INCONTINENCE 03/13/2007  . COLONIC POLYPS, HX OF 03/13/2007  . HX, URINARY INFECTION 08/14/2006   Past Medical History:  Diagnosis Date  . Allergic rhinitis   . CAD (coronary artery disease)   . Cancer ([place])    skin  . Chronic kidney disease    overactive bladder  . Colon polyp   . Diverticulosis    sigmoid and descending colon  . DM (diabetes mellitus) ([place])     no longer take any diabetic meds now that off Prednisone, type 2 diet controlled  . Elevated LFTs over one year ago  . Esophageal ring   . GERD (gastroesophageal reflux disease)   . History of hepatitis 1975   unsure of type  . Hyperlipemia   . Hypertension   . Internal hemorrhoids   . Lichen sclerosus 81/19   (DX BY GYN AFTER ABN PAP)  . Pemphigoid, cicatricial    D/O AFFECTING EYES, MOUTH, THROAT NOW (DX [facility])  . Shingles 03/13/2013  . Urinary incontinence    OVERACTIVE BLADDER   Past Surgical History:  Procedure Laterality Date  . ABDOMINAL HYSTERECTOMY    . ANGIOPLASTY  yrs ago   LAD  . CARDIAC ELECTROPHYSIOLOGY MAPPING AND ABLATION    . CATARACT EXTRACTION Bilateral   . COLONOSCOPY    . ESOPHAGOGASTRODUODENOSCOPY  09/14/2011   Procedure: ESOPHAGOGASTRODUODENOSCOPY (EGD);  Surgeon: Gatha Mayer, MD;  Location: [facility];  Service: Endoscopy;  Laterality: N/A;  needs xray  . ESOPHAGOGASTRODUODENOSCOPY N/A 01/02/2013   Procedure: ESOPHAGOGASTRODUODENOSCOPY (EGD);  Surgeon: Gatha Mayer, MD;  Location: [facility];  Service: Endoscopy;  Laterality: N/A;  . ESOPHAGOGASTRODUODENOSCOPY N/A 04/12/2013   Procedure: ESOPHAGOGASTRODUODENOSCOPY (EGD);  Surgeon: Gatha Mayer, MD;  Location: [facility];  Service: Endoscopy;  Laterality: N/A;  . ESOPHAGOGASTRODUODENOSCOPY (EGD) WITH PROPOFOL N/A 05/24/2016   Procedure: ESOPHAGOGASTRODUODENOSCOPY (EGD) WITH PROPOFOL;  Surgeon: Gatha Mayer, MD;  Location: [facility];  Service: Endoscopy;  Laterality: N/A;  . Venia Minks DILATION N/A 01/02/2013   Procedure: Venia Minks DILATION;  Surgeon: Gatha Mayer, MD;  Location: [facility];  Service: Endoscopy;  Laterality: N/A;  . Venia Minks DILATION N/A 05/24/2016   Procedure: Venia Minks DILATION;  Surgeon: Gatha Mayer, MD;  Location: [facility];  Service: Endoscopy;  Laterality: N/A;  . NOSE SURGERY     Biopsy  . OOPHORECTOMY    . SAVORY DILATION  09/14/2011   Procedure: SAVORY DILATION;   Surgeon: Gatha Mayer, MD;  Location: [facility];  Service: Endoscopy;  Laterality: N/A;  . SAVORY DILATION N/A 01/02/2013   Procedure: SAVORY DILATION;  Surgeon: Gatha Mayer, MD;  Location: [facility];  Service: Endoscopy;  Laterality: N/A;  . SAVORY DILATION N/A 04/12/2013   Procedure: SAVORY DILATION;  Surgeon: Gatha Mayer, MD;  Location: [facility];  Service: Endoscopy;  Laterality: N/A;  . skin cancer removal  02/2016   Social History   Tobacco Use  . Smoking status: Former  Smoker    Last attempt to quit: 02/29/1968    Years since quitting: 49.4  . Smokeless tobacco: Never Used  Substance Use Topics  . Alcohol use: No    Alcohol/week: 0.0 oz  . Drug use: No   Family History  Problem Relation Age of Onset  . Lung cancer Father   . Esophageal cancer Son 13  . Stroke Mother        CVA  . Alzheimer's disease Mother   . Hypertension Brother   . Stomach cancer Son 58  . Breast cancer Maternal Aunt   . Colon cancer Neg Hx   . Rectal cancer Neg Hx    Allergies  Allergen Reactions  . Aspirin     REACTION: bleeds too easily-  . Lipitor [Atorvastatin]     REACTION: elevated LFT's  . Ultram [Tramadol]     dizzy  . Adhesive  [Tape] Rash  . Latex Itching and Rash  . Tetanus Toxoid Rash   Current Outpatient Medications on File Prior to Visit  Medication Sig Dispense Refill  . acetaminophen (TYLENOL) 500 MG tablet Take 500 mg by mouth daily as needed for moderate pain or headache.    . brimonidine (ALPHAGAN) 0.15 % ophthalmic solution Place 1 drop into both eyes 2 (two) times daily.     . prednisoLONE acetate (OMNIPRED) 1 % ophthalmic suspension Place 1 drop into both eyes daily.     . sodium chloride (OCEAN) 0.65 % SOLN nasal spray Place 1 spray into both nostrils as needed for congestion.    . timolol (BETIMOL) 0.5 % ophthalmic solution Place 1 drop into both eyes 2 (two) times daily.      No current facility-administered medications on file prior to visit.       Review of Systems  Constitutional: Negative for activity change, appetite change, fatigue, fever and unexpected weight change.  HENT: Negative for congestion, ear pain, rhinorrhea, sinus pressure and sore throat.        Oral pemphigoid with bone loss  Eyes: Negative for pain, redness and visual disturbance.  Respiratory: Negative for cough, shortness of breath and wheezing.   Cardiovascular: Negative for chest pain and palpitations.  Gastrointestinal: Positive for abdominal pain and diarrhea. Negative for blood in stool, constipation, nausea, rectal pain and vomiting.  Endocrine: Negative for polydipsia and polyuria.  Genitourinary: Negative for dysuria, frequency and urgency.  Musculoskeletal: Positive for neck pain. Negative for arthralgias, back pain and myalgias.       Occ L neck muscle pain   Skin: Negative for pallor and rash.       Rash on face recently - tx by derm  Oral pemphigoid  Allergic/Immunologic: Negative for environmental allergies.  Neurological: Negative for dizziness, syncope and headaches.  Hematological: Negative for adenopathy. Does not bruise/bleed easily.  Psychiatric/Behavioral: Negative for decreased concentration and dysphoric mood. The patient is not nervous/anxious.        Objective:   Physical Exam  Constitutional: She appears well-developed and well-nourished. No distress.  Well appearing elderly female   HENT:  Head: Normocephalic and atraumatic.  Right Ear: External ear normal.  Left Ear: External ear normal.  Mouth/Throat: Oropharynx is clear and moist.  Gum and bone loss for pemphigoid  Eyes: Pupils are equal, round, and reactive to light. Conjunctivae and EOM are normal. No scleral icterus.  Neck: Normal range of motion. Neck supple. No JVD present. Carotid bruit is not present. No thyromegaly present.  Cardiovascular: Normal rate, regular rhythm, normal heart  sounds and intact distal pulses. Exam reveals no gallop.  Pulmonary/Chest: Effort  normal and breath sounds normal. No respiratory distress. She has no wheezes. She exhibits no tenderness. No breast tenderness, discharge or bleeding.  Abdominal: Soft. Bowel sounds are normal. She exhibits no distension, no abdominal bruit and no mass. There is no tenderness. There is no rebound and no guarding. No hernia.  Genitourinary: No breast tenderness, discharge or bleeding.  Genitourinary Comments: Breast exam: No mass, nodules, thickening, tenderness, bulging, retraction, inflamation, nipple discharge or skin changes noted.  No axillary or clavicular LA.      Musculoskeletal: Normal range of motion. She exhibits no edema or tenderness.  Lymphadenopathy:    She has no cervical adenopathy.  Neurological: She is alert. She has normal reflexes. No cranial nerve deficit. She exhibits normal muscle tone. Coordination normal.  Skin: Skin is warm and dry. No rash noted. No erythema. No pallor.  sks diffusely Fair   Few erythematous spots on L side of face recently tx by derm  Psychiatric: She has a normal mood and affect.          Assessment & Plan:   Problem List Items Addressed This Visit      Cardiovascular and Mediastinum   Essential hypertension - Primary    bp in fair control at this time  BP Readings from Last 1 Encounters:  07/19/17 122/60   No changes needed Most recent labs reviewed  Disc lifstyle change with low sodium diet and exercise        Relevant Medications   lisinopril (PRINIVIL,ZESTRIL) 10 MG tablet   pravastatin (PRAVACHOL) 40 MG tablet     Digestive   Dysphagia    With hx of esophageal rings and dilatation  Also hx of oral pemphigoid  Sees dr Carlean Purl Ref done       Relevant Orders   Ambulatory referral to Gastroenterology   GERD    Continue ppi  F/u with GI for dysphagia      Relevant Medications   omeprazole (PRILOSEC) 40 MG capsule     Musculoskeletal and Integument   PEMPHIGOID    Continues to exp receeding gums / bone  loss Dentures are more uncomfortable- wears them less  Sees derm reg  No tx currently        Other   Bowel habit changes    abd cramping followed by loose stool that is hard to pass Hx of colon polyps  ? If poss obst  Also mildly anemic  Likely needs diag colonoscopy  Ref to GI Has seen Dr Carlean Purl      Relevant Orders   Ambulatory referral to Gastroenterology   Elevated glucose    106 fasting  Was eating ice cream-has stopped  disc imp of low glycemic diet and wt loss to prevent DM2       Hyperlipidemia LDL goal <100    Disc goals for lipids and reasons to control them Rev last labs with pt Rev low sat fat diet in detail LDL up with ice cream  Continue pravastatin  Better diet       Relevant Medications   lisinopril (PRINIVIL,ZESTRIL) 10 MG tablet   pravastatin (PRAVACHOL) 40 MG tablet   Screening mammogram, encounter for    Scheduled annual screening mammogram Nl breast exam today  Encouraged monthly self exams        Relevant Orders   MM DIGITAL SCREENING BILATERAL    Other Visit Diagnoses    Other seasonal allergic  rhinitis       Relevant Medications   fluticasone (FLONASE) 50 MCG/ACT nasal spray

## 2017-07-19 NOTE — Assessment & Plan Note (Signed)
abd cramping followed by loose stool that is hard to pass Hx of colon polyps  ? If poss obst  Also mildly anemic  Likely needs diag colonoscopy  Ref to GI Has seen Dr Carlean Purl

## 2017-07-19 NOTE — Assessment & Plan Note (Signed)
bp in fair control at this time  BP Readings from Last 1 Encounters:  07/19/17 122/60   No changes needed Most recent labs reviewed  Disc lifstyle change with low sodium diet and exercise

## 2017-07-19 NOTE — Assessment & Plan Note (Signed)
Continues to exp receeding gums / bone loss Dentures are more uncomfortable- wears them less  Sees derm reg  No tx currently

## 2017-07-19 NOTE — Assessment & Plan Note (Signed)
Scheduled annual screening mammogram Nl breast exam today  Encouraged monthly self exams   

## 2017-07-19 NOTE — Patient Instructions (Addendum)
If you stop caffeine- cut it back slowly by 1 drink per week   We will refer to GI Also for mammogram   Keep taking care of yourself

## 2017-07-19 NOTE — Assessment & Plan Note (Signed)
106 fasting  Was eating ice cream-has stopped  disc imp of low glycemic diet and wt loss to prevent DM2

## 2017-08-04 ENCOUNTER — Ambulatory Visit
Admission: RE | Admit: 2017-08-04 | Discharge: 2017-08-04 | Disposition: A | Discharge status: Home / Self Care | Payer: Medicare Other | Source: Ambulatory Visit | Attending: Family Medicine | Admitting: Family Medicine

## 2017-08-04 DIAGNOSIS — Z1231 Encounter for screening mammogram for malignant neoplasm of breast: Secondary | ICD-10-CM

## 2017-08-07 ENCOUNTER — Encounter: Payer: Self-pay | Admitting: Gastroenterology

## 2017-08-07 ENCOUNTER — Ambulatory Visit (INDEPENDENT_AMBULATORY_CARE_PROVIDER_SITE_OTHER): Payer: Medicare Other | Admitting: Gastroenterology

## 2017-08-07 VITALS — BP 116/80 | HR 74 | Ht 60.0 in | Wt 121.0 lb

## 2017-08-07 DIAGNOSIS — R1310 Dysphagia, unspecified: Secondary | ICD-10-CM | POA: Diagnosis not present

## 2017-08-07 DIAGNOSIS — K222 Esophageal obstruction: Secondary | ICD-10-CM | POA: Diagnosis not present

## 2017-08-07 DIAGNOSIS — R1319 Other dysphagia: Secondary | ICD-10-CM

## 2017-08-07 DIAGNOSIS — K5900 Constipation, unspecified: Secondary | ICD-10-CM | POA: Diagnosis not present

## 2017-08-07 NOTE — H&P (View-Only) (Signed)
08/07/2017 Victoria Allison [id number] October 22, 1940   HISTORY OF PRESENT ILLNESS: This is a 76 year old female who is a patient of Dr. Celesta Aver.  She has a history of esophageal stenosis.  Her last EGD in March 2018 at which time she had [facility] dilation with fluoroscopy.  She presents here today with recurrent dysphagia.  Says that it has been an issue again for the past 6 months or so but it has been worsening.  She also says that her dentures no longer fit well because her gums have eroded so she knows that it affects her chewing her food well.  Mostly meats and breads get stuck.  Takes small bites.  Sometimes has to bring food back up.  Takes omeprazole 40 mg daily.  Also reports constipation and gets intermittent LLQ abdominal pains at times.  These pains will occur and then later she has to have a BM, which is often hard stool and difficult to pass.  Does not see blood in her stool.   Past Medical History:  Diagnosis Date  . Allergic rhinitis   . CAD (coronary artery disease)   . Cancer ([place])    skin  . Chronic kidney disease    overactive bladder  . Colon polyp   . Diverticulosis    sigmoid and descending colon  . DM (diabetes mellitus) ([place])    no longer take any diabetic meds now that off Prednisone, type 2 diet controlled  . Elevated LFTs over one year ago  . Esophageal ring   . GERD (gastroesophageal reflux disease)   . History of hepatitis 1975   unsure of type  . Hyperlipemia   . Hypertension   . Internal hemorrhoids   . Lichen sclerosus 31/51   (DX BY GYN AFTER ABN PAP)  . Pemphigoid, cicatricial    D/O AFFECTING EYES, MOUTH, THROAT NOW (DX [facility])  . Shingles 03/13/2013  . Urinary incontinence    OVERACTIVE BLADDER   Past Surgical History:  Procedure Laterality Date  . ABDOMINAL HYSTERECTOMY    . ANGIOPLASTY  yrs ago   LAD  . CARDIAC ELECTROPHYSIOLOGY MAPPING AND ABLATION    . CATARACT EXTRACTION Bilateral   . COLONOSCOPY    . ESOPHAGOGASTRODUODENOSCOPY   09/14/2011   Procedure: ESOPHAGOGASTRODUODENOSCOPY (EGD);  Surgeon: Gatha Mayer, MD;  Location: [facility];  Service: Endoscopy;  Laterality: N/A;  needs xray  . ESOPHAGOGASTRODUODENOSCOPY N/A 01/02/2013   Procedure: ESOPHAGOGASTRODUODENOSCOPY (EGD);  Surgeon: Gatha Mayer, MD;  Location: [facility];  Service: Endoscopy;  Laterality: N/A;  . ESOPHAGOGASTRODUODENOSCOPY N/A 04/12/2013   Procedure: ESOPHAGOGASTRODUODENOSCOPY (EGD);  Surgeon: Gatha Mayer, MD;  Location: [facility];  Service: Endoscopy;  Laterality: N/A;  . ESOPHAGOGASTRODUODENOSCOPY (EGD) WITH PROPOFOL N/A 05/24/2016   Procedure: ESOPHAGOGASTRODUODENOSCOPY (EGD) WITH PROPOFOL;  Surgeon: Gatha Mayer, MD;  Location: [facility];  Service: Endoscopy;  Laterality: N/A;  . Venia Minks DILATION N/A 01/02/2013   Procedure: Venia Minks DILATION;  Surgeon: Gatha Mayer, MD;  Location: [facility];  Service: Endoscopy;  Laterality: N/A;  . Venia Minks DILATION N/A 05/24/2016   Procedure: Venia Minks DILATION;  Surgeon: Gatha Mayer, MD;  Location: [facility];  Service: Endoscopy;  Laterality: N/A;  . NOSE SURGERY     Biopsy  . OOPHORECTOMY    . SAVORY DILATION  09/14/2011   Procedure: SAVORY DILATION;  Surgeon: Gatha Mayer, MD;  Location: [facility];  Service: Endoscopy;  Laterality: N/A;  . SAVORY DILATION N/A 01/02/2013   Procedure: SAVORY DILATION;  Surgeon: Gatha Mayer, MD;  Location: [facility];  Service: Endoscopy;  Laterality: N/A;  . SAVORY DILATION N/A 04/12/2013   Procedure: SAVORY DILATION;  Surgeon: Gatha Mayer, MD;  Location: [facility];  Service: Endoscopy;  Laterality: N/A;  . skin cancer removal  02/2016    reports that she quit smoking about 49 years ago. She has never used smokeless tobacco. She reports that she does not drink alcohol or use drugs. family history includes Alzheimer's disease in her mother; Breast cancer in her maternal aunt; Esophageal cancer (age of onset: 62) in her son; Hypertension in  her brother; Lung cancer in her father; Stomach cancer (age of onset: 57) in her son; Stroke in her mother. Allergies  Allergen Reactions  . Aspirin     REACTION: bleeds too easily-  . Lipitor [Atorvastatin]     REACTION: elevated LFT's  . Ultram [Tramadol]     dizzy  . Adhesive  [Tape] Rash  . Latex Itching and Rash  . Tetanus Toxoid Rash      Outpatient Encounter Medications as of 08/07/2017  Medication Sig  . acetaminophen (TYLENOL) 500 MG tablet Take 500 mg by mouth daily as needed for moderate pain or headache.  . brimonidine (ALPHAGAN) 0.15 % ophthalmic solution Place 1 drop into both eyes 2 (two) times daily.   . fluticasone (FLONASE) 50 MCG/ACT nasal spray Place 2 sprays into both nostrils daily as needed for allergies.  Marland Kitchen lisinopril (PRINIVIL,ZESTRIL) 10 MG tablet Take 1 tablet (10 mg total) by mouth daily.  Marland Kitchen omeprazole (PRILOSEC) 40 MG capsule Take 1 capsule (40 mg total) by mouth daily.  Marland Kitchen oxybutynin (DITROPAN XL) 15 MG 24 hr tablet Take 1 tablet (15 mg total) by mouth daily.  . pravastatin (PRAVACHOL) 40 MG tablet Take 1 tablet (40 mg total) by mouth daily.  . prednisoLONE acetate (OMNIPRED) 1 % ophthalmic suspension Place 1 drop into both eyes daily.   . sodium chloride (OCEAN) 0.65 % SOLN nasal spray Place 1 spray into both nostrils as needed for congestion.  . timolol (BETIMOL) 0.5 % ophthalmic solution Place 1 drop into both eyes 2 (two) times daily.    No facility-administered encounter medications on file as of 08/07/2017.      REVIEW OF SYSTEMS  : All other systems reviewed and negative except where noted in the History of Present Illness.   PHYSICAL EXAM: BP 116/80   Pulse 74   Ht 5' (1.524 m)   Wt 121 lb (54.9 kg)   LMP 02/29/1968   BMI 23.63 kg/m  General: Well developed white female in no acute distress Head: Normocephalic and atraumatic Eyes:  Sclerae anicteric, conjunctiva pink. Ears: Normal auditory acuity Lungs: Clear throughout to  auscultation; no increased WOB. Heart: Regular rate and rhythm; no M/RG. Abdomen: Soft, non-distended.  Normal bowel sounds.  Non-tender. Musculoskeletal: Symmetrical with no gross deformities  Skin: No lesions on visible extremities Extremities: No edema  Neurological: Alert oriented x 4, grossly non-focal Psychological:  Alert and cooperative. Normal mood and affect  ASSESSMENT AND PLAN: *Dysphagia, history of esophageal rings:  With recurrent complaints of dysphagia to solid foods.  Will schedule for EGD with dilation with Dr. Carlean Purl.  I think that a component of her dysphagia actually has to do with her not being able to chew her food well since her dentures are not fitting well. *Constipation and LLQ abdominal pain:  Will have her start Miralax daily and see if treating the constipation helps with her  abdominal pain as well.  **The risks, benefits, and alternatives to EGD with dilation were discussed with the patient and she consents to proceed.   CC:  Tower, Wynelle Fanny, MD

## 2017-08-07 NOTE — Patient Instructions (Signed)
You have been scheduled for an endoscopy. Please follow written instructions given to you at your visit today. If you use inhalers (even only as needed), please bring them with you on the day of your procedure. Your physician has requested that you go to [email or web address] and enter the access code given to you at your visit today. This web site gives a general overview about your procedure. However, you should still follow specific instructions given to you by our office regarding your preparation for the procedure.  Start Miralax once daily.

## 2017-08-07 NOTE — Progress Notes (Addendum)
08/07/2017 Victoria Allison [id number] Feb 03, 1941   HISTORY OF PRESENT ILLNESS: This is a 77 year old female who is a patient of Dr. Celesta Aver.  She has a history of esophageal stenosis.  Her last EGD in March 2018 at which time she had [facility] dilation with fluoroscopy.  She presents here today with recurrent dysphagia.  Says that it has been an issue again for the past 6 months or so but it has been worsening.  She also says that her dentures no longer fit well because her gums have eroded so she knows that it affects her chewing her food well.  Mostly meats and breads get stuck.  Takes small bites.  Sometimes has to bring food back up.  Takes omeprazole 40 mg daily.  Also reports constipation and gets intermittent LLQ abdominal pains at times.  These pains will occur and then later she has to have a BM, which is often hard stool and difficult to pass.  Does not see blood in her stool.   Past Medical History:  Diagnosis Date  . Allergic rhinitis   . CAD (coronary artery disease)   . Cancer ([place])    skin  . Chronic kidney disease    overactive bladder  . Colon polyp   . Diverticulosis    sigmoid and descending colon  . DM (diabetes mellitus) ([place])    no longer take any diabetic meds now that off Prednisone, type 2 diet controlled  . Elevated LFTs over one year ago  . Esophageal ring   . GERD (gastroesophageal reflux disease)   . History of hepatitis 1975   unsure of type  . Hyperlipemia   . Hypertension   . Internal hemorrhoids   . Lichen sclerosus 37/62   (DX BY GYN AFTER ABN PAP)  . Pemphigoid, cicatricial    D/O AFFECTING EYES, MOUTH, THROAT NOW (DX [facility])  . Shingles 03/13/2013  . Urinary incontinence    OVERACTIVE BLADDER   Past Surgical History:  Procedure Laterality Date  . ABDOMINAL HYSTERECTOMY    . ANGIOPLASTY  yrs ago   LAD  . CARDIAC ELECTROPHYSIOLOGY MAPPING AND ABLATION    . CATARACT EXTRACTION Bilateral   . COLONOSCOPY    . ESOPHAGOGASTRODUODENOSCOPY   09/14/2011   Procedure: ESOPHAGOGASTRODUODENOSCOPY (EGD);  Surgeon: Gatha Mayer, MD;  Location: [facility];  Service: Endoscopy;  Laterality: N/A;  needs xray  . ESOPHAGOGASTRODUODENOSCOPY N/A 01/02/2013   Procedure: ESOPHAGOGASTRODUODENOSCOPY (EGD);  Surgeon: Gatha Mayer, MD;  Location: [facility];  Service: Endoscopy;  Laterality: N/A;  . ESOPHAGOGASTRODUODENOSCOPY N/A 04/12/2013   Procedure: ESOPHAGOGASTRODUODENOSCOPY (EGD);  Surgeon: Gatha Mayer, MD;  Location: [facility];  Service: Endoscopy;  Laterality: N/A;  . ESOPHAGOGASTRODUODENOSCOPY (EGD) WITH PROPOFOL N/A 05/24/2016   Procedure: ESOPHAGOGASTRODUODENOSCOPY (EGD) WITH PROPOFOL;  Surgeon: Gatha Mayer, MD;  Location: [facility];  Service: Endoscopy;  Laterality: N/A;  . Venia Minks DILATION N/A 01/02/2013   Procedure: Venia Minks DILATION;  Surgeon: Gatha Mayer, MD;  Location: [facility];  Service: Endoscopy;  Laterality: N/A;  . Venia Minks DILATION N/A 05/24/2016   Procedure: Venia Minks DILATION;  Surgeon: Gatha Mayer, MD;  Location: [facility];  Service: Endoscopy;  Laterality: N/A;  . NOSE SURGERY     Biopsy  . OOPHORECTOMY    . SAVORY DILATION  09/14/2011   Procedure: SAVORY DILATION;  Surgeon: Gatha Mayer, MD;  Location: [facility];  Service: Endoscopy;  Laterality: N/A;  . SAVORY DILATION N/A 01/02/2013   Procedure: SAVORY DILATION;  Surgeon: Gatha Mayer, MD;  Location: [facility];  Service: Endoscopy;  Laterality: N/A;  . SAVORY DILATION N/A 04/12/2013   Procedure: SAVORY DILATION;  Surgeon: Gatha Mayer, MD;  Location: [facility];  Service: Endoscopy;  Laterality: N/A;  . skin cancer removal  02/2016    reports that she quit smoking about 49 years ago. She has never used smokeless tobacco. She reports that she does not drink alcohol or use drugs. family history includes Alzheimer's disease in her mother; Breast cancer in her maternal aunt; Esophageal cancer (age of onset: 63) in her son; Hypertension in  her brother; Lung cancer in her father; Stomach cancer (age of onset: 52) in her son; Stroke in her mother. Allergies  Allergen Reactions  . Aspirin     REACTION: bleeds too easily-  . Lipitor [Atorvastatin]     REACTION: elevated LFT's  . Ultram [Tramadol]     dizzy  . Adhesive  [Tape] Rash  . Latex Itching and Rash  . Tetanus Toxoid Rash      Outpatient Encounter Medications as of 08/07/2017  Medication Sig  . acetaminophen (TYLENOL) 500 MG tablet Take 500 mg by mouth daily as needed for moderate pain or headache.  . brimonidine (ALPHAGAN) 0.15 % ophthalmic solution Place 1 drop into both eyes 2 (two) times daily.   . fluticasone (FLONASE) 50 MCG/ACT nasal spray Place 2 sprays into both nostrils daily as needed for allergies.  Marland Kitchen lisinopril (PRINIVIL,ZESTRIL) 10 MG tablet Take 1 tablet (10 mg total) by mouth daily.  Marland Kitchen omeprazole (PRILOSEC) 40 MG capsule Take 1 capsule (40 mg total) by mouth daily.  Marland Kitchen oxybutynin (DITROPAN XL) 15 MG 24 hr tablet Take 1 tablet (15 mg total) by mouth daily.  . pravastatin (PRAVACHOL) 40 MG tablet Take 1 tablet (40 mg total) by mouth daily.  . prednisoLONE acetate (OMNIPRED) 1 % ophthalmic suspension Place 1 drop into both eyes daily.   . sodium chloride (OCEAN) 0.65 % SOLN nasal spray Place 1 spray into both nostrils as needed for congestion.  . timolol (BETIMOL) 0.5 % ophthalmic solution Place 1 drop into both eyes 2 (two) times daily.    No facility-administered encounter medications on file as of 08/07/2017.      REVIEW OF SYSTEMS  : All other systems reviewed and negative except where noted in the History of Present Illness.   PHYSICAL EXAM: BP 116/80   Pulse 74   Ht 5' (1.524 m)   Wt 121 lb (54.9 kg)   LMP 02/29/1968   BMI 23.63 kg/m  General: Well developed white female in no acute distress Head: Normocephalic and atraumatic Eyes:  Sclerae anicteric, conjunctiva pink. Ears: Normal auditory acuity Lungs: Clear throughout to  auscultation; no increased WOB. Heart: Regular rate and rhythm; no M/RG. Abdomen: Soft, non-distended.  Normal bowel sounds.  Non-tender. Musculoskeletal: Symmetrical with no gross deformities  Skin: No lesions on visible extremities Extremities: No edema  Neurological: Alert oriented x 4, grossly non-focal Psychological:  Alert and cooperative. Normal mood and affect  ASSESSMENT AND PLAN: *Dysphagia, history of esophageal rings:  With recurrent complaints of dysphagia to solid foods.  Will schedule for EGD with dilation with Dr. Carlean Purl.  I think that a component of her dysphagia actually has to do with her not being able to chew her food well since her dentures are not fitting well. *Constipation and LLQ abdominal pain:  Will have her start Miralax daily and see if treating the constipation helps with her  abdominal pain as well.  **The risks, benefits, and alternatives to EGD with dilation were discussed with the patient and she consents to proceed.   CC:  Tower, Wynelle Fanny, MD  Agree with Ms. Draden Cottingham's management.  Gatha Mayer, MD, [facility]

## 2017-08-08 NOTE — Addendum Note (Signed)
Addended by: Wyline Beady on: 08/08/2017 02:20 PM   Modules accepted: Orders, SmartSet

## 2017-08-17 ENCOUNTER — Encounter: Payer: Medicare Other | Admitting: Internal Medicine

## 2017-08-21 ENCOUNTER — Encounter (HOSPITAL_COMMUNITY): Payer: Self-pay | Admitting: *Deleted

## 2017-08-22 ENCOUNTER — Ambulatory Visit (HOSPITAL_COMMUNITY): Payer: Medicare Other

## 2017-08-22 ENCOUNTER — Ambulatory Visit (HOSPITAL_COMMUNITY)
Admission: RE | Admit: 2017-08-22 | Discharge: 2017-08-22 | Disposition: A | Discharge status: Home / Self Care | Payer: Medicare Other | Source: Ambulatory Visit | Attending: Internal Medicine | Admitting: Internal Medicine

## 2017-08-22 ENCOUNTER — Encounter (HOSPITAL_COMMUNITY)
Admission: RE | Disposition: A | Discharge status: Home / Self Care | Payer: Self-pay | Source: Ambulatory Visit | Attending: Internal Medicine

## 2017-08-22 ENCOUNTER — Other Ambulatory Visit: Payer: Self-pay

## 2017-08-22 ENCOUNTER — Encounter (HOSPITAL_COMMUNITY): Payer: Self-pay

## 2017-08-22 ENCOUNTER — Ambulatory Visit (HOSPITAL_COMMUNITY): Payer: Medicare Other | Admitting: Anesthesiology

## 2017-08-22 DIAGNOSIS — I2510 Atherosclerotic heart disease of native coronary artery without angina pectoris: Secondary | ICD-10-CM | POA: Insufficient documentation

## 2017-08-22 DIAGNOSIS — Z85828 Personal history of other malignant neoplasm of skin: Secondary | ICD-10-CM | POA: Insufficient documentation

## 2017-08-22 DIAGNOSIS — I10 Essential (primary) hypertension: Secondary | ICD-10-CM | POA: Diagnosis not present

## 2017-08-22 DIAGNOSIS — R1310 Dysphagia, unspecified: Secondary | ICD-10-CM | POA: Diagnosis not present

## 2017-08-22 DIAGNOSIS — N189 Chronic kidney disease, unspecified: Secondary | ICD-10-CM | POA: Diagnosis not present

## 2017-08-22 DIAGNOSIS — R1319 Other dysphagia: Secondary | ICD-10-CM

## 2017-08-22 DIAGNOSIS — K222 Esophageal obstruction: Secondary | ICD-10-CM

## 2017-08-22 DIAGNOSIS — E1122 Type 2 diabetes mellitus with diabetic chronic kidney disease: Secondary | ICD-10-CM | POA: Insufficient documentation

## 2017-08-22 DIAGNOSIS — E785 Hyperlipidemia, unspecified: Secondary | ICD-10-CM | POA: Insufficient documentation

## 2017-08-22 DIAGNOSIS — Z79899 Other long term (current) drug therapy: Secondary | ICD-10-CM | POA: Diagnosis not present

## 2017-08-22 DIAGNOSIS — Z800 Family history of malignant neoplasm of digestive organs: Secondary | ICD-10-CM | POA: Insufficient documentation

## 2017-08-22 DIAGNOSIS — I129 Hypertensive chronic kidney disease with stage 1 through stage 4 chronic kidney disease, or unspecified chronic kidney disease: Secondary | ICD-10-CM | POA: Insufficient documentation

## 2017-08-22 DIAGNOSIS — Z87891 Personal history of nicotine dependence: Secondary | ICD-10-CM | POA: Diagnosis not present

## 2017-08-22 DIAGNOSIS — K5900 Constipation, unspecified: Secondary | ICD-10-CM

## 2017-08-22 DIAGNOSIS — Z86010 Personal history of colonic polyps: Secondary | ICD-10-CM | POA: Diagnosis not present

## 2017-08-22 DIAGNOSIS — K219 Gastro-esophageal reflux disease without esophagitis: Secondary | ICD-10-CM | POA: Insufficient documentation

## 2017-08-22 HISTORY — PX: ESOPHAGOGASTRODUODENOSCOPY (EGD) WITH PROPOFOL: SHX5813

## 2017-08-22 HISTORY — PX: MALONEY DILATION: SHX5535

## 2017-08-22 SURGERY — ESOPHAGOGASTRODUODENOSCOPY (EGD) WITH PROPOFOL
Anesthesia: Monitor Anesthesia Care

## 2017-08-22 MED ORDER — SODIUM CHLORIDE 0.9 % IV SOLN: INTRAVENOUS | Status: DC

## 2017-08-22 MED ORDER — PROPOFOL 10 MG/ML IV BOLUS
INTRAVENOUS | Status: AC
  Filled 2017-08-22: qty 20

## 2017-08-22 MED ORDER — LACTATED RINGERS IV SOLN
INTRAVENOUS | Status: DC
  Administered 2017-08-22: 14:00:00 via INTRAVENOUS

## 2017-08-22 MED ORDER — LIDOCAINE 2% (20 MG/ML) 5 ML SYRINGE
INTRAMUSCULAR | Status: DC | PRN
  Administered 2017-08-22: 40 mg via INTRAVENOUS

## 2017-08-22 MED ORDER — PROPOFOL 10 MG/ML IV BOLUS
INTRAVENOUS | Status: DC | PRN
  Administered 2017-08-22 (×4): 20 mg via INTRAVENOUS

## 2017-08-22 SURGICAL SUPPLY — 15 items

## 2017-08-22 NOTE — Discharge Instructions (Addendum)
° °  I dilated the esophagus like I did last year.  If this does not relieve your swallowing problems let me know.  YOU HAD AN ENDOSCOPIC PROCEDURE TODAY: Refer to the procedure report and other information in the discharge instructions given to you for any specific questions about what was found during the examination. If this information does not answer your questions, please call Dr. Celesta Aver office at [phone] to clarify.   YOU SHOULD EXPECT: Some feelings of bloating in the abdomen. Passage of more gas than usual. Walking can help get rid of the air that was put into your GI tract during the procedure and reduce the bloating. If you had a lower endoscopy (such as a colonoscopy or flexible sigmoidoscopy) you may notice spotting of blood in your stool or on the toilet paper. Some abdominal soreness may be present for a day or two, also.  DIET:   Clear liquids only until 4 PM today then try soft foods - and regular consistency foods tomorrow.   ACTIVITY: Your care partner should take you home directly after the procedure. You should plan to take it easy, moving slowly for the rest of the day. You can resume normal activity the day after the procedure however YOU SHOULD NOT DRIVE, use power tools, machinery or perform tasks that involve climbing or major physical exertion for 24 hours (because of the sedation medicines used during the test).   SYMPTOMS TO REPORT IMMEDIATELY: A gastroenterologist can be reached at any hour. Please call [phone]  for any of the following symptoms:   Following upper endoscopy (EGD, EUS, ERCP, esophageal dilation) Vomiting of blood or coffee ground material  New, significant abdominal pain  New, significant chest pain or pain under the shoulder blades  Painful or persistently difficult swallowing  New shortness of breath  Black, tarry-looking or red, bloody stools   I appreciate the opportunity to care for you. Gatha Mayer, MD, [facility]

## 2017-08-22 NOTE — Anesthesia Preprocedure Evaluation (Signed)
Anesthesia Evaluation  Patient identified by MRN, date of birth, ID band Patient awake    Reviewed: Allergy & Precautions, NPO status , Patient's Chart, lab work & pertinent test results  Airway Mallampati: II  TM Distance: >3 FB Neck ROM: Full    Dental no notable dental hx.    Pulmonary neg pulmonary ROS, former smoker,    Pulmonary exam normal breath sounds clear to auscultation       Cardiovascular hypertension, Normal cardiovascular exam Rhythm:Regular Rate:Normal     Neuro/Psych negative neurological ROS  negative psych ROS   GI/Hepatic Neg liver ROS, GERD  Medicated,  Endo/Other  negative endocrine ROSdiabetes  Renal/GU negative Renal ROS  negative genitourinary   Musculoskeletal negative musculoskeletal ROS (+)   Abdominal   Peds negative pediatric ROS (+)  Hematology negative hematology ROS (+)   Anesthesia Other Findings   Reproductive/Obstetrics negative OB ROS                             Anesthesia Physical Anesthesia Plan  ASA: II  Anesthesia Plan: MAC   Post-op Pain Management:    Induction: Intravenous  PONV Risk Score and Plan: 0  Airway Management Planned: Simple Face Mask  Additional Equipment:   Intra-op Plan:   Post-operative Plan:   Informed Consent: I have reviewed the patients History and Physical, chart, labs and discussed the procedure including the risks, benefits and alternatives for the proposed anesthesia with the patient or authorized representative who has indicated his/her understanding and acceptance.   Dental advisory given  Plan Discussed with: CRNA and Surgeon  Anesthesia Plan Comments:         Anesthesia Quick Evaluation

## 2017-08-22 NOTE — Op Note (Signed)
[facility] Patient Name: Victoria Allison Procedure Date: 08/22/2017 MRN: [id number] Attending MD: Gatha Mayer , MD Date of Birth: 1940-03-31 CSN: [id number] Age: 77 Admit Type: Inpatient Procedure:                Upper GI endoscopy Indications:              Dysphagia, Stenosis of the esophagus, For therapy                            of esophageal stenosis Providers:                Gatha Mayer, MD, Cleda Daub, RN, Elspeth Cho Tech., Technician, Marla Roe, CRNA Referring MD:              Medicines:                Propofol per Anesthesia, Monitored Anesthesia Care Complications:            No immediate complications. Estimated Blood Loss:     Estimated blood loss was minimal. Procedure:                Pre-Anesthesia Assessment:                           - Prior to the procedure, a History and Physical                            was performed, and patient medications and                            allergies were reviewed. The patient's tolerance of                            previous anesthesia was also reviewed. The risks                            and benefits of the procedure and the sedation                            options and risks were discussed with the patient.                            All questions were answered, and informed consent                            was obtained. Prior Anticoagulants: The patient has                            taken no previous anticoagulant or antiplatelet                            agents. ASA Grade Assessment: III - A patient with  severe systemic disease. After reviewing the risks                            and benefits, the patient was deemed in                            satisfactory condition to undergo the procedure.                           After obtaining informed consent, the endoscope was                            passed under direct vision.  Throughout the                            procedure, the patient's blood pressure, pulse, and                            oxygen saturations were monitored continuously. The                            [id number] ([id number]) scope was introduced through the                            mouth, and advanced to the body of the stomach. The                            upper GI endoscopy was accomplished without                            difficulty. The patient tolerated the procedure                            well. Scope In: Scope Out: Findings:      Multiple benign-appearing, intrinsic moderate stenoses (rings) were       found in the lower third of the esophagus. The narrowest stenosis       measured 1.2 cm (inner diameter). The stenoses were traversed. The scope       was withdrawn. Dilation was performed with a Maloney dilator with mild       resistance at 44 Fr and 48 Fr. Fluoroscopic guidance used. The dilation       site was examined following endoscope reinsertion and showed moderate       mucosal disruption and moderate improvement in luminal narrowing.       Estimated blood loss was minimal.      The exam was otherwise without abnormality.      The cardia and gastric fundus were normal on retroflexion. Impression:               - Benign-appearing esophageal stenoses - rings - as                            in past. Dilated. 44 and 48 Fr                           -  The examination was otherwise normal.                           - No specimens collected. Moderate Sedation:      N/A- Per Anesthesia Care Recommendation:           - Patient has a contact number available for                            emergencies. The signs and symptoms of potential                            delayed complications were discussed with the                            patient. Return to normal activities tomorrow.                            Written discharge instructions were provided to the                             patient.                           - Clear liquids x 1 hour then soft foods rest of                            day. Start prior diet tomorrow.                           - Continue present medications.                           - Repeat upper endoscopy PRN for retreatment. Procedure Code(s):        --- Professional ---                           34196, Esophagoscopy, flexible, transoral;                            diagnostic, including collection of specimen(s) by                            brushing or washing, when performed (separate                            procedure)                           43450, Dilation of esophagus, by unguided sound or                            bougie, single or multiple passes Diagnosis Code(s):        --- Professional ---  K22.2, Esophageal obstruction                           R13.10, Dysphagia, unspecified CPT copyright 2017 [employer]. All rights reserved. The codes documented in this report are preliminary and upon coder review may  be revised to meet current compliance requirements. Gatha Mayer, MD 08/22/2017 2:41:37 PM This report has been signed electronically. Number of Addenda: 0

## 2017-08-22 NOTE — Interval H&P Note (Signed)
History and Physical Interval Note:  08/22/2017 2:11 PM  Victoria Allison  has presented today for surgery, with the diagnosis of esophageal rings, dysphagia/db  The various methods of treatment have been discussed with the patient and family. After consideration of risks, benefits and other options for treatment, the patient has consented to  Procedure(s): ESOPHAGOGASTRODUODENOSCOPY (EGD) WITH PROPOFOL (N/A) MALONEY DILATION (N/A) as a surgical intervention .  The patient's history has been reviewed, patient examined, no change in status, stable for surgery.  I have reviewed the patient's chart and labs.  Questions were answered to the patient's satisfaction.     Silvano Rusk

## 2017-08-22 NOTE — Transfer of Care (Signed)
Immediate Anesthesia Transfer of Care Note  Patient: Victoria Allison  Procedure(s) Performed: ESOPHAGOGASTRODUODENOSCOPY (EGD) WITH PROPOFOL (N/A ) MALONEY DILATION (N/A )  Patient Location: PACU  Anesthesia Type:MAC  Level of Consciousness: sedated  Airway & Oxygen Therapy: Patient Spontanous Breathing and Patient connected to nasal cannula oxygen  Post-op Assessment: Report given to RN and Post -op Vital signs reviewed and stable  Post vital signs: Reviewed and stable  Last Vitals:  Vitals Value Taken Time  BP    Temp    Pulse    Resp    SpO2      Last Pain:  Vitals:   08/22/17 1211  TempSrc: Oral  PainSc: 0-No pain         Complications: No apparent anesthesia complications

## 2017-08-22 NOTE — Anesthesia Procedure Notes (Signed)
Date/Time: 08/22/2017 2:19 PM Performed by: Talbot Grumbling, CRNA Oxygen Delivery Method: Nasal cannula

## 2017-08-23 ENCOUNTER — Encounter (HOSPITAL_COMMUNITY): Payer: Self-pay | Admitting: Internal Medicine

## 2017-08-23 NOTE — Anesthesia Postprocedure Evaluation (Signed)
Anesthesia Post Note  Patient: KIEARA SCHWARK  Procedure(s) Performed: ESOPHAGOGASTRODUODENOSCOPY (EGD) WITH PROPOFOL (N/A ) MALONEY DILATION (N/A )     Patient location during evaluation: PACU Anesthesia Type: MAC Level of consciousness: awake and alert Pain management: pain level controlled Vital Signs Assessment: post-procedure vital signs reviewed and stable Respiratory status: spontaneous breathing, nonlabored ventilation, respiratory function stable and patient connected to nasal cannula oxygen Cardiovascular status: stable and blood pressure returned to baseline Postop Assessment: no apparent nausea or vomiting Anesthetic complications: no    Last Vitals:  Vitals:   08/22/17 1450 08/22/17 1500  BP: (!) 159/64 (!) 160/73  Pulse: 63 (!) 59  Resp: 15 11  Temp:    SpO2: 100% 100%    Last Pain:  Vitals:   08/22/17 1500  TempSrc:   PainSc: 0-No pain                 Dewain Platz S

## 2017-08-23 NOTE — Addendum Note (Signed)
Addendum  created 08/23/17 0740 by Lollie Sails, CRNA   Charge Capture section accepted

## 2018-01-09 DIAGNOSIS — H40003 Preglaucoma, unspecified, bilateral: Secondary | ICD-10-CM | POA: Diagnosis not present

## 2018-01-09 DIAGNOSIS — H4063X0 Glaucoma secondary to drugs, bilateral, stage unspecified: Secondary | ICD-10-CM | POA: Diagnosis not present

## 2018-01-16 DIAGNOSIS — L121 Cicatricial pemphigoid: Secondary | ICD-10-CM | POA: Diagnosis not present

## 2018-02-01 ENCOUNTER — Encounter: Payer: Self-pay | Admitting: Family Medicine

## 2018-02-01 ENCOUNTER — Ambulatory Visit (INDEPENDENT_AMBULATORY_CARE_PROVIDER_SITE_OTHER): Payer: Medicare Other | Admitting: Family Medicine

## 2018-02-01 VITALS — BP 134/62 | HR 67 | Temp 97.9°F | Ht 60.0 in | Wt 116.2 lb

## 2018-02-01 DIAGNOSIS — Z23 Encounter for immunization: Secondary | ICD-10-CM

## 2018-02-01 DIAGNOSIS — L129 Pemphigoid, unspecified: Secondary | ICD-10-CM

## 2018-02-01 DIAGNOSIS — J0100 Acute maxillary sinusitis, unspecified: Secondary | ICD-10-CM

## 2018-02-01 DIAGNOSIS — J019 Acute sinusitis, unspecified: Secondary | ICD-10-CM | POA: Insufficient documentation

## 2018-02-01 MED ORDER — AMOXICILLIN-POT CLAVULANATE 875-125 MG PO TABS: 1.0000 | ORAL_TABLET | Freq: Two times a day (BID) | ORAL | 0 refills | Status: DC

## 2018-02-01 NOTE — Patient Instructions (Addendum)
Take augmentin for a sinus infection  Drink lots of fluids  Rest when you can  You can try nasal saline spray over the counter for congestion also   Update if not starting to improve in a week or if worsening    Flu shot today

## 2018-02-01 NOTE — Progress Notes (Signed)
Subjective:    Patient ID: Victoria Allison, female    DOB: April 05, 1940, 77 y.o.   MRN: [id number]  HPI Here for uri symptoms   3 weeks of symptoms    (in setting of exp to hosp pneumonia from husband)   Nasal congestion and sinus pressure  Throat is sore  Ears are ok   Hoarse voice  No cough or sob No fever   Tried mucinex  Due to pemphigoid the top of mouth is sore  Her doctor for this retired-may need a ref in the future /unsure who they will set her up with  Still loosing gum tissue unfortunately   Needs flu shot today  Patient Active Problem List   Diagnosis Date Noted  . Acute sinusitis 02/01/2018  . Constipation 08/07/2017  . Bowel habit changes 07/19/2017  . Screening mammogram, encounter for 07/19/2017  . Elevated glucose 07/19/2017  . Cramp in muscle 04/22/2014  . Colon cancer screening 12/13/2013  . Dysphagia 04/12/2013  . Encounter for Medicare annual wellness exam 12/10/2012  . Esophageal rings 08/24/2011  . SUPRAVENTRICULAR TACHYCARDIA 09/22/2009  . ARTHRITIS, SHOULDER 05/21/2009  . INSOMNIA 02/05/2009  . POSTMENOPAUSAL STATUS 06/02/2008  . PEMPHIGOID 11/14/2007  . Nonspecific (abnormal) findings on radiological and other examination of body structure 07/05/2007  . Hyperlipidemia LDL goal <100 03/13/2007  . Essential hypertension 03/13/2007  . CORONARY ARTERY DISEASE 03/13/2007  . Allergic rhinitis 03/13/2007  . GERD 03/13/2007  . HAIR LOSS 03/13/2007  . LEG PAIN, CHRONIC 03/13/2007  . URINARY INCONTINENCE 03/13/2007  . COLONIC POLYPS, HX OF 03/13/2007  . HX, URINARY INFECTION 08/14/2006   Past Medical History:  Diagnosis Date  . Allergic rhinitis   . CAD (coronary artery disease)   . Cancer ([place])    skin  . Chronic kidney disease    overactive bladder  . Colon polyp   . Diverticulosis    sigmoid and descending colon  . DM (diabetes mellitus) ([place])    no longer take any diabetic meds now that off Prednisone, type 2 diet controlled  .  Elevated LFTs over one year ago  . Esophageal ring   . GERD (gastroesophageal reflux disease)   . History of hepatitis 1975   unsure of type  . Hyperlipemia   . Hypertension   . Internal hemorrhoids   . Lichen sclerosus 47/09   (DX BY GYN AFTER ABN PAP)  . Pemphigoid, cicatricial    D/O AFFECTING EYES, MOUTH, THROAT NOW (DX [facility])  . Shingles 03/13/2013  . Urinary incontinence    OVERACTIVE BLADDER   Past Surgical History:  Procedure Laterality Date  . ABDOMINAL HYSTERECTOMY    . ANGIOPLASTY  yrs ago   LAD  . CARDIAC ELECTROPHYSIOLOGY MAPPING AND ABLATION    . CATARACT EXTRACTION Bilateral   . COLONOSCOPY    . ESOPHAGOGASTRODUODENOSCOPY  09/14/2011   Procedure: ESOPHAGOGASTRODUODENOSCOPY (EGD);  Surgeon: Gatha Mayer, MD;  Location: [facility];  Service: Endoscopy;  Laterality: N/A;  needs xray  . ESOPHAGOGASTRODUODENOSCOPY N/A 01/02/2013   Procedure: ESOPHAGOGASTRODUODENOSCOPY (EGD);  Surgeon: Gatha Mayer, MD;  Location: [facility];  Service: Endoscopy;  Laterality: N/A;  . ESOPHAGOGASTRODUODENOSCOPY N/A 04/12/2013   Procedure: ESOPHAGOGASTRODUODENOSCOPY (EGD);  Surgeon: Gatha Mayer, MD;  Location: [facility];  Service: Endoscopy;  Laterality: N/A;  . ESOPHAGOGASTRODUODENOSCOPY (EGD) WITH PROPOFOL N/A 05/24/2016   Procedure: ESOPHAGOGASTRODUODENOSCOPY (EGD) WITH PROPOFOL;  Surgeon: Gatha Mayer, MD;  Location: [facility];  Service: Endoscopy;  Laterality: N/A;  . ESOPHAGOGASTRODUODENOSCOPY (EGD)  WITH PROPOFOL N/A 08/22/2017   Procedure: ESOPHAGOGASTRODUODENOSCOPY (EGD) WITH PROPOFOL;  Surgeon: Gatha Mayer, MD;  Location: [facility];  Service: Endoscopy;  Laterality: N/A;  . Venia Minks DILATION N/A 01/02/2013   Procedure: Venia Minks DILATION;  Surgeon: Gatha Mayer, MD;  Location: [facility];  Service: Endoscopy;  Laterality: N/A;  . Venia Minks DILATION N/A 05/24/2016   Procedure: Venia Minks DILATION;  Surgeon: Gatha Mayer, MD;  Location: [facility];  Service:  Endoscopy;  Laterality: N/A;  . Venia Minks DILATION N/A 08/22/2017   Procedure: Venia Minks DILATION;  Surgeon: Gatha Mayer, MD;  Location: [facility];  Service: Endoscopy;  Laterality: N/A;  . NOSE SURGERY     Biopsy  . OOPHORECTOMY    . SAVORY DILATION  09/14/2011   Procedure: SAVORY DILATION;  Surgeon: Gatha Mayer, MD;  Location: [facility];  Service: Endoscopy;  Laterality: N/A;  . SAVORY DILATION N/A 01/02/2013   Procedure: SAVORY DILATION;  Surgeon: Gatha Mayer, MD;  Location: [facility];  Service: Endoscopy;  Laterality: N/A;  . SAVORY DILATION N/A 04/12/2013   Procedure: SAVORY DILATION;  Surgeon: Gatha Mayer, MD;  Location: [facility];  Service: Endoscopy;  Laterality: N/A;  . skin cancer removal  02/2016   Social History   Tobacco Use  . Smoking status: Former Smoker    Last attempt to quit: 02/29/1968    Years since quitting: 49.9  . Smokeless tobacco: Never Used  Substance Use Topics  . Alcohol use: No    Alcohol/week: 0.0 standard drinks  . Drug use: No   Family History  Problem Relation Age of Onset  . Lung cancer Father   . Esophageal cancer Son 47  . Stroke Mother        CVA  . Alzheimer's disease Mother   . Hypertension Brother   . Stomach cancer Son 80  . Breast cancer Maternal Aunt   . Colon cancer Neg Hx   . Rectal cancer Neg Hx    Allergies  Allergen Reactions  . Aspirin Other (See Comments)    Bleeds too easily  . Lipitor [Atorvastatin] Other (See Comments)    Elevated LFT's  . Ultram [Tramadol] Other (See Comments)    Dizziness  . Adhesive  [Tape] Rash  . Latex Itching and Rash  . Tetanus Toxoid Rash   Current Outpatient Medications on File Prior to Visit  Medication Sig Dispense Refill  . acetaminophen (TYLENOL) 500 MG tablet Take 500 mg by mouth daily as needed for moderate pain or headache.    . brimonidine (ALPHAGAN) 0.2 % ophthalmic solution Place 1 drop into both eyes daily.    . fluticasone (FLONASE) 50 MCG/ACT nasal spray  Place 2 sprays into both nostrils daily as needed for allergies. 16 g 11  . lisinopril (PRINIVIL,ZESTRIL) 10 MG tablet Take 1 tablet (10 mg total) by mouth daily. 90 tablet 3  . omeprazole (PRILOSEC) 40 MG capsule Take 1 capsule (40 mg total) by mouth daily. 90 capsule 3  . oxybutynin (DITROPAN XL) 15 MG 24 hr tablet Take 1 tablet (15 mg total) by mouth daily. 90 tablet 3  . pravastatin (PRAVACHOL) 40 MG tablet Take 1 tablet (40 mg total) by mouth daily. 90 tablet 3  . prednisoLONE acetate (OMNIPRED) 1 % ophthalmic suspension Place 1 drop into both eyes daily.     . sodium chloride (OCEAN) 0.65 % SOLN nasal spray Place 1 spray into both nostrils as needed for congestion.    . timolol (BETIMOL) 0.5 %  ophthalmic solution Place 1 drop into both eyes 2 (two) times daily.      No current facility-administered medications on file prior to visit.     Review of Systems  Constitutional: Positive for appetite change. Negative for fatigue and fever.  HENT: Positive for congestion, ear pain, mouth sores, postnasal drip, rhinorrhea, sinus pressure and sore throat. Negative for nosebleeds.   Eyes: Negative for pain, redness and itching.  Respiratory: Positive for cough. Negative for shortness of breath and wheezing.   Cardiovascular: Negative for chest pain.  Gastrointestinal: Negative for abdominal pain, diarrhea, nausea and vomiting.  Endocrine: Negative for polyuria.  Genitourinary: Negative for dysuria, frequency and urgency.  Musculoskeletal: Negative for arthralgias and myalgias.  Allergic/Immunologic: Negative for immunocompromised state.  Neurological: Positive for headaches. Negative for dizziness, tremors, syncope, weakness and numbness.  Hematological: Negative for adenopathy. Does not bruise/bleed easily.  Psychiatric/Behavioral: Negative for dysphoric mood. The patient is not nervous/anxious.        Objective:   Physical Exam  Constitutional: She appears well-developed and  well-nourished. No distress.  Well appearing elderly female  HENT:  Head: Normocephalic and atraumatic.  Right Ear: External ear normal.  Left Ear: External ear normal.  Mouth/Throat: Oropharynx is clear and moist. No oropharyngeal exudate.  Nares are injected and congested  Bilateral maxillary sinus tenderness  Post nasal drip   Gum loss noted from pemphigoid  No ulcers in mouth currently   Eyes: Pupils are equal, round, and reactive to light. Conjunctivae and EOM are normal. Right eye exhibits no discharge. Left eye exhibits no discharge.  Neck: Normal range of motion. Neck supple.  Cardiovascular: Normal rate and regular rhythm.  Pulmonary/Chest: Effort normal and breath sounds normal. No respiratory distress. She has no wheezes. She has no rales.  Good air exch   Lymphadenopathy:    She has no cervical adenopathy.  Neurological: She is alert. No cranial nerve deficit.  Skin: Skin is warm and dry. No rash noted.  Psychiatric: She has a normal mood and affect.          Assessment & Plan:   Problem List Items Addressed This Visit      Respiratory   Acute sinusitis - Primary    3 weeks of ongoing nasal cong and sinus pressure Some hoarseness Disc symptomatic care - see instructions on AVS  tx with augmentin  Update if not starting to improve in a week or if worsening          Relevant Medications   amoxicillin-clavulanate (AUGMENTIN) 875-125 MG tablet     Musculoskeletal and Integument   PEMPHIGOID    Continues to progress with gum loss No helpful tx  Her doctor is retiring/leaving -may need ref upcoming (dermatology)         Other Visit Diagnoses    Need for influenza vaccination       Relevant Orders   Flu Vaccine QUAD 6+ mos PF IM (Fluarix Quad PF) (Completed)

## 2018-02-04 NOTE — Assessment & Plan Note (Signed)
3 weeks of ongoing nasal cong and sinus pressure Some hoarseness Disc symptomatic care - see instructions on AVS  tx with augmentin  Update if not starting to improve in a week or if worsening

## 2018-02-04 NOTE — Assessment & Plan Note (Signed)
Continues to progress with gum loss No helpful tx  Her doctor is retiring/leaving -may need ref upcoming (dermatology)

## 2018-03-29 ENCOUNTER — Ambulatory Visit (INDEPENDENT_AMBULATORY_CARE_PROVIDER_SITE_OTHER): Payer: Medicare Other | Admitting: Family Medicine

## 2018-03-29 ENCOUNTER — Encounter: Payer: Self-pay | Admitting: Family Medicine

## 2018-03-29 VITALS — BP 144/62 | HR 71 | Temp 98.1°F | Resp 12 | Ht 60.0 in | Wt 115.8 lb

## 2018-03-29 DIAGNOSIS — J0101 Acute recurrent maxillary sinusitis: Secondary | ICD-10-CM

## 2018-03-29 MED ORDER — AMOXICILLIN-POT CLAVULANATE 875-125 MG PO TABS: 1.0000 | ORAL_TABLET | Freq: Two times a day (BID) | ORAL | 0 refills | Status: AC

## 2018-03-29 NOTE — Patient Instructions (Signed)
  Antibiotics are not need for a viral infection but the following will help:   1. Drink plenty of fluids 2. Get lots of rest  Sinus Congestion 1) Neti Pot (Saline rinse) -- 2 times day -- if tolerated 2) Flonase (Store Brand ok) - once daily 3) Over the counter congestion medications  Cough 1) Cough drops can be helpful 2) Nyquil (or nighttime cough medication) 3) Honey is proven to be one of the best cough medications   Sore Throat 1) Honey as above, cough drops 2) Ibuprofen or Aleve can be helpful 3) Salt water Gargles  If you develop fevers (Temperature >100.4), chills, worsening symptoms or symptoms lasting longer than 10 days return to clinic.

## 2018-03-29 NOTE — Progress Notes (Signed)
Subjective:     Victoria Allison is a 78 y.o. female presenting for URI (symptoms started Friday 03/23/2018. Hoarse voice, head congestion, headache a little bit, sometimes coughing upyellow phleghm. No fever. Took Mucinex and Benadryl.)     URI   This is a new problem. The current episode started in the past 7 days. The problem has been unchanged. There has been no fever. Associated symptoms include congestion, coughing, ear pain, headaches, a plugged ear sensation, sinus pain and a sore throat. Pertinent negatives include no abdominal pain, chest pain, nausea, rhinorrhea or vomiting. Associated symptoms comments: Hoarse voice. She has tried decongestant and antihistamine for the symptoms. The treatment provided mild relief.     Review of Systems  HENT: Positive for congestion, ear pain, sinus pain and sore throat. Negative for rhinorrhea.   Respiratory: Positive for cough.   Cardiovascular: Negative for chest pain.  Gastrointestinal: Negative for abdominal pain, nausea and vomiting.  Neurological: Positive for headaches.     Social History   Tobacco Use  Smoking Status Former Smoker  . Last attempt to quit: 02/29/1968  . Years since quitting: 50.1  Smokeless Tobacco Never Used        Objective:    BP Readings from Last 3 Encounters:  03/29/18 (!) 144/62  02/01/18 134/62  08/22/17 (!) 160/73   Wt Readings from Last 3 Encounters:  03/29/18 115 lb 12 oz (52.5 kg)  02/01/18 116 lb 4 oz (52.7 kg)  08/07/17 121 lb (54.9 kg)    BP (!) 144/62   Pulse 71   Temp 98.1 F (36.7 C)   Resp 12   Ht 5' (1.524 m)   Wt 115 lb 12 oz (52.5 kg)   LMP 02/29/1968   SpO2 100%   BMI 22.61 kg/m    Physical Exam Constitutional:      General: She is not in acute distress.    Appearance: She is well-developed. She is not diaphoretic.  HENT:     Head: Normocephalic and atraumatic.     Right Ear: Tympanic membrane and ear canal normal.     Left Ear: Ear canal normal. A middle ear  effusion is present. Tympanic membrane is not erythematous or bulging.     Nose: Mucosal edema, congestion and rhinorrhea present.     Right Sinus: Maxillary sinus tenderness present. No frontal sinus tenderness.     Left Sinus: Maxillary sinus tenderness present. No frontal sinus tenderness.     Mouth/Throat:     Pharynx: Uvula midline. No oropharyngeal exudate or posterior oropharyngeal erythema.     Tonsils: Swelling: 0 on the right. 0 on the left.     Comments: Dentures and mental poles present Eyes:     General: No scleral icterus.    Conjunctiva/sclera: Conjunctivae normal.  Neck:     Musculoskeletal: Neck supple.  Cardiovascular:     Rate and Rhythm: Normal rate and regular rhythm.     Heart sounds: Normal heart sounds. No murmur.  Pulmonary:     Effort: Pulmonary effort is normal. No respiratory distress.     Breath sounds: Normal breath sounds.  Lymphadenopathy:     Cervical: No cervical adenopathy.  Skin:    General: Skin is warm and dry.     Capillary Refill: Capillary refill takes less than 2 seconds.  Neurological:     Mental Status: She is alert.  Psychiatric:        Mood and Affect: Mood normal.  Behavior: Behavior normal.           Assessment & Plan:   Problem List Items Addressed This Visit      Respiratory   Acute sinusitis - Primary   Relevant Medications   amoxicillin-clavulanate (AUGMENTIN) 875-125 MG tablet     Symptomatic care in addition to Abx  Return if symptoms worsen or fail to improve.  Lesleigh Noe, MD

## 2018-04-11 ENCOUNTER — Ambulatory Visit (INDEPENDENT_AMBULATORY_CARE_PROVIDER_SITE_OTHER): Payer: Medicare Other | Admitting: Family Medicine

## 2018-04-11 ENCOUNTER — Encounter: Payer: Self-pay | Admitting: Family Medicine

## 2018-04-11 VITALS — BP 102/60 | HR 67 | Temp 98.2°F | Ht 60.0 in | Wt 116.6 lb

## 2018-04-11 DIAGNOSIS — J040 Acute laryngitis: Secondary | ICD-10-CM

## 2018-04-11 NOTE — Patient Instructions (Signed)
I think you have viral laryngitis   It may turn into a cold (it may not)   Voice rest is the best thing to do Drink lots of fluids Salt water gargle may help throat discomfort  Breathing steam may help also   Watch for fever/ productive cough or wheezing and let us know   Update if not starting to improve in a week or if worsening

## 2018-04-11 NOTE — Assessment & Plan Note (Signed)
Suspect viral  Has had several sinus infections but those are resolved  sympt care disc  Poss of cold symptoms or bronchitis discussed Voice rest/steam / gargles Update if not starting to improve in a week or if worsening  -esp fever or cough I doubt this is related to her oral pemphigoid since her throat looks clear but will continue to monitor

## 2018-04-11 NOTE — Progress Notes (Signed)
Subjective:    Patient ID: Victoria Allison, female    DOB: [birth year], 78 y.o.   MRN: [id number]  HPI  Here for ST and hoarse voice  Has had 2 sinus infections since early dec   Last tx on 1/30 with augmentin  She got better   Hoarse voice started 2 days ago  Husband has pneumonia at home  ST -not too sore   Nasal congestion in am only  No fever  No cough  Chest feels a little irritated -occ spits up a bit of phlegm   She has oral pemphigoid   No otc  medicines   Patient Active Problem List   Diagnosis Date Noted  . Laryngitis, acute 04/11/2018  . Constipation 08/07/2017  . Bowel habit changes 07/19/2017  . Screening mammogram, encounter for 07/19/2017  . Elevated glucose 07/19/2017  . Cramp in muscle 04/22/2014  . Colon cancer screening 12/13/2013  . Dysphagia 04/12/2013  . Encounter for Medicare annual wellness exam 12/10/2012  . Esophageal rings 08/24/2011  . SUPRAVENTRICULAR TACHYCARDIA 09/22/2009  . ARTHRITIS, SHOULDER 05/21/2009  . INSOMNIA 02/05/2009  . POSTMENOPAUSAL STATUS 06/02/2008  . PEMPHIGOID 11/14/2007  . Nonspecific (abnormal) findings on radiological and other examination of body structure 07/05/2007  . Hyperlipidemia LDL goal <100 03/13/2007  . Essential hypertension 03/13/2007  . CORONARY ARTERY DISEASE 03/13/2007  . Allergic rhinitis 03/13/2007  . GERD 03/13/2007  . HAIR LOSS 03/13/2007  . LEG PAIN, CHRONIC 03/13/2007  . URINARY INCONTINENCE 03/13/2007  . COLONIC POLYPS, HX OF 03/13/2007  . HX, URINARY INFECTION 08/14/2006   Past Medical History:  Diagnosis Date  . Allergic rhinitis   . CAD (coronary artery disease)   . Cancer ([place])    skin  . Chronic kidney disease    overactive bladder  . Colon polyp   . Diverticulosis    sigmoid and descending colon  . DM (diabetes mellitus) ([place])    no longer take any diabetic meds now that off Prednisone, type 2 diet controlled  . Elevated LFTs over one year ago  . Esophageal ring   . GERD  (gastroesophageal reflux disease)   . History of hepatitis 1975   unsure of type  . Hyperlipemia   . Hypertension   . Internal hemorrhoids   . Lichen sclerosus 40/98   (DX BY GYN AFTER ABN PAP)  . Pemphigoid, cicatricial    D/O AFFECTING EYES, MOUTH, THROAT NOW (DX [facility])  . Shingles 03/13/2013  . Urinary incontinence    OVERACTIVE BLADDER   Past Surgical History:  Procedure Laterality Date  . ABDOMINAL HYSTERECTOMY    . ANGIOPLASTY  yrs ago   LAD  . CARDIAC ELECTROPHYSIOLOGY MAPPING AND ABLATION    . CATARACT EXTRACTION Bilateral   . COLONOSCOPY    . ESOPHAGOGASTRODUODENOSCOPY  09/14/2011   Procedure: ESOPHAGOGASTRODUODENOSCOPY (EGD);  Surgeon: Gatha Mayer, MD;  Location: [facility];  Service: Endoscopy;  Laterality: N/A;  needs xray  . ESOPHAGOGASTRODUODENOSCOPY N/A 01/02/2013   Procedure: ESOPHAGOGASTRODUODENOSCOPY (EGD);  Surgeon: Gatha Mayer, MD;  Location: [facility];  Service: Endoscopy;  Laterality: N/A;  . ESOPHAGOGASTRODUODENOSCOPY N/A 04/12/2013   Procedure: ESOPHAGOGASTRODUODENOSCOPY (EGD);  Surgeon: Gatha Mayer, MD;  Location: [facility];  Service: Endoscopy;  Laterality: N/A;  . ESOPHAGOGASTRODUODENOSCOPY (EGD) WITH PROPOFOL N/A 05/24/2016   Procedure: ESOPHAGOGASTRODUODENOSCOPY (EGD) WITH PROPOFOL;  Surgeon: Gatha Mayer, MD;  Location: [facility];  Service: Endoscopy;  Laterality: N/A;  . ESOPHAGOGASTRODUODENOSCOPY (EGD) WITH PROPOFOL N/A 08/22/2017   Procedure: ESOPHAGOGASTRODUODENOSCOPY (  EGD) WITH PROPOFOL;  Surgeon: Gatha Mayer, MD;  Location: [facility];  Service: Endoscopy;  Laterality: N/A;  . Venia Minks DILATION N/A 01/02/2013   Procedure: Venia Minks DILATION;  Surgeon: Gatha Mayer, MD;  Location: [facility];  Service: Endoscopy;  Laterality: N/A;  . Venia Minks DILATION N/A 05/24/2016   Procedure: Venia Minks DILATION;  Surgeon: Gatha Mayer, MD;  Location: [facility];  Service: Endoscopy;  Laterality: N/A;  . Venia Minks DILATION N/A 08/22/2017    Procedure: Venia Minks DILATION;  Surgeon: Gatha Mayer, MD;  Location: [facility];  Service: Endoscopy;  Laterality: N/A;  . NOSE SURGERY     Biopsy  . OOPHORECTOMY    . SAVORY DILATION  09/14/2011   Procedure: SAVORY DILATION;  Surgeon: Gatha Mayer, MD;  Location: [facility];  Service: Endoscopy;  Laterality: N/A;  . SAVORY DILATION N/A 01/02/2013   Procedure: SAVORY DILATION;  Surgeon: Gatha Mayer, MD;  Location: [facility];  Service: Endoscopy;  Laterality: N/A;  . SAVORY DILATION N/A 04/12/2013   Procedure: SAVORY DILATION;  Surgeon: Gatha Mayer, MD;  Location: [facility];  Service: Endoscopy;  Laterality: N/A;  . skin cancer removal  02/2016   Social History   Tobacco Use  . Smoking status: Former Smoker    Last attempt to quit: 02/29/1968    Years since quitting: 50.1  . Smokeless tobacco: Never Used  Substance Use Topics  . Alcohol use: No    Alcohol/week: 0.0 standard drinks  . Drug use: No   Family History  Problem Relation Age of Onset  . Lung cancer Father   . Esophageal cancer Son 17  . Stroke Mother        CVA  . Alzheimer's disease Mother   . Hypertension Brother   . Stomach cancer Son 18  . Breast cancer Maternal Aunt   . Colon cancer Neg Hx   . Rectal cancer Neg Hx    Allergies  Allergen Reactions  . Aspirin Other (See Comments)    Bleeds too easily  . Lipitor [Atorvastatin] Other (See Comments)    Elevated LFT's  . Ultram [Tramadol] Other (See Comments)    Dizziness  . Adhesive  [Tape] Rash  . Latex Itching and Rash  . Tetanus Toxoid Rash   Current Outpatient Medications on File Prior to Visit  Medication Sig Dispense Refill  . acetaminophen (TYLENOL) 500 MG tablet Take 500 mg by mouth daily as needed for moderate pain or headache.    . brimonidine (ALPHAGAN) 0.2 % ophthalmic solution Place 1 drop into both eyes daily.    . fluticasone (FLONASE) 50 MCG/ACT nasal spray Place 2 sprays into both nostrils daily as needed for allergies. 16  g 11  . lisinopril (PRINIVIL,ZESTRIL) 10 MG tablet Take 1 tablet (10 mg total) by mouth daily. 90 tablet 3  . omeprazole (PRILOSEC) 40 MG capsule Take 1 capsule (40 mg total) by mouth daily. 90 capsule 3  . oxybutynin (DITROPAN XL) 15 MG 24 hr tablet Take 1 tablet (15 mg total) by mouth daily. 90 tablet 3  . pravastatin (PRAVACHOL) 40 MG tablet Take 1 tablet (40 mg total) by mouth daily. 90 tablet 3  . prednisoLONE acetate (OMNIPRED) 1 % ophthalmic suspension Place 1 drop into both eyes daily.     . sodium chloride (OCEAN) 0.65 % SOLN nasal spray Place 1 spray into both nostrils as needed for congestion.    . timolol (BETIMOL) 0.5 % ophthalmic solution Place 1 drop into both eyes  2 (two) times daily.      No current facility-administered medications on file prior to visit.      Review of Systems  Constitutional: Negative for activity change, appetite change, fatigue, fever and unexpected weight change.  HENT: Positive for postnasal drip, sneezing, sore throat and voice change. Negative for congestion, ear pain, rhinorrhea and sinus pressure.   Eyes: Negative for pain, redness and visual disturbance.  Respiratory: Negative for cough, choking, chest tightness, shortness of breath, wheezing and stridor.   Cardiovascular: Negative for chest pain and palpitations.  Gastrointestinal: Negative for abdominal pain, blood in stool, constipation and diarrhea.  Endocrine: Negative for polydipsia and polyuria.  Genitourinary: Negative for dysuria, frequency and urgency.  Musculoskeletal: Negative for arthralgias, back pain and myalgias.  Skin: Negative for pallor and rash.  Allergic/Immunologic: Negative for environmental allergies.  Neurological: Negative for dizziness, syncope and headaches.  Hematological: Negative for adenopathy. Does not bruise/bleed easily.  Psychiatric/Behavioral: Negative for decreased concentration and dysphoric mood. The patient is not nervous/anxious.        Objective:    Physical Exam Constitutional:      General: She is not in acute distress.    Appearance: She is well-developed and normal weight. She is not ill-appearing.  HENT:     Head: Normocephalic and atraumatic.     Comments: No sinus tenderness     Right Ear: Tympanic membrane and ear canal normal.     Left Ear: Tympanic membrane and ear canal normal.     Nose: Nose normal.     Comments: Boggy nares     Mouth/Throat:     Mouth: Mucous membranes are moist.     Pharynx: Oropharynx is clear. No oropharyngeal exudate or posterior oropharyngeal erythema.     Comments: Baseline oral pemphigoid with bone loss under her dentures No mucosal change in throat or on palate Eyes:     General: No scleral icterus.       Right eye: No discharge.        Left eye: No discharge.     Extraocular Movements: Extraocular movements intact.     Conjunctiva/sclera: Conjunctivae normal.     Pupils: Pupils are equal, round, and reactive to light.  Neck:     Musculoskeletal: Normal range of motion. No neck rigidity.  Cardiovascular:     Rate and Rhythm: Normal rate and regular rhythm.     Heart sounds: Normal heart sounds.  Pulmonary:     Effort: Pulmonary effort is normal. No respiratory distress.     Breath sounds: No stridor. No wheezing, rhonchi or rales.     Comments: Good air exch Chest:     Chest wall: No tenderness.  Lymphadenopathy:     Cervical: No cervical adenopathy.  Skin:    General: Skin is warm and dry.     Findings: No rash.  Neurological:     Mental Status: She is alert. Mental status is at baseline.     Cranial Nerves: No cranial nerve deficit.  Psychiatric:        Mood and Affect: Mood normal.           Assessment & Plan:   Problem List Items Addressed This Visit      Respiratory   Laryngitis, acute - Primary    Suspect viral  Has had several sinus infections but those are resolved  sympt care disc  Poss of cold symptoms or bronchitis discussed Voice rest/steam /  gargles Update if not starting to improve  in a week or if worsening  -esp fever or cough I doubt this is related to her oral pemphigoid since her throat looks clear but will continue to monitor

## 2018-07-11 DIAGNOSIS — H4063X0 Glaucoma secondary to drugs, bilateral, stage unspecified: Secondary | ICD-10-CM | POA: Diagnosis not present

## 2018-07-18 DIAGNOSIS — L121 Cicatricial pemphigoid: Secondary | ICD-10-CM | POA: Diagnosis not present

## 2018-07-30 ENCOUNTER — Ambulatory Visit (INDEPENDENT_AMBULATORY_CARE_PROVIDER_SITE_OTHER): Payer: Medicare Other

## 2018-07-30 ENCOUNTER — Other Ambulatory Visit: Payer: Self-pay | Admitting: Family Medicine

## 2018-07-30 DIAGNOSIS — Z0000 Encounter for general adult medical examination without abnormal findings: Secondary | ICD-10-CM | POA: Diagnosis not present

## 2018-07-30 NOTE — Patient Instructions (Signed)
Victoria Allison , Thank you for taking time to come for your Medicare Wellness Visit. I appreciate your ongoing commitment to your health goals. Please review the following plan we discussed and let me know if I can assist you in the future.   These are the goals we discussed: Goals    . Increase physical activity     Starting 07/30/2018, I will continue to walk at least 60 minutes daily.        This is a list of the screening recommended for you and due dates:  Health Maintenance  Topic Date Due  . Tetanus Vaccine  02/28/2020*  . Mammogram  08/05/2018  . Flu Shot  09/29/2018  . DEXA scan (bone density measurement)  Completed  . Pneumonia vaccines  Completed  *Topic was postponed. The date shown is not the original due date.   Preventive Care for Adults  A healthy lifestyle and preventive care can promote health and wellness. Preventive health guidelines for adults include the following key practices.  . A routine yearly physical is a good way to check with your health care provider about your health and preventive screening. It is a chance to share any concerns and updates on your health and to receive a thorough exam.  . Visit your dentist for a routine exam and preventive care every 6 months. Brush your teeth twice a day and floss once a day. Good oral hygiene prevents tooth decay and gum disease.  . The frequency of eye exams is based on your age, health, family medical history, use  of contact lenses, and other factors. Follow your health care provider's recommendations for frequency of eye exams.  . Eat a healthy diet. Foods like vegetables, fruits, whole grains, low-fat dairy products, and lean protein foods contain the nutrients you need without too many calories. Decrease your intake of foods high in solid fats, added sugars, and salt. Eat the right amount of calories for you. Get information about a proper diet from your health care provider, if necessary.  . Regular physical  exercise is one of the most important things you can do for your health. Most adults should get at least 150 minutes of moderate-intensity exercise (any activity that increases your heart rate and causes you to sweat) each week. In addition, most adults need muscle-strengthening exercises on 2 or more days a week.  Silver Sneakers may be a benefit available to you. To determine eligibility, you may visit the website: www.silversneakers.com or contact program at 567 732 9926 Mon-Fri between 8AM-8PM.   . Maintain a healthy weight. The body mass index (BMI) is a screening tool to identify possible weight problems. It provides an estimate of body fat based on height and weight. Your health care provider can find your BMI and can help you achieve or maintain a healthy weight.   For adults 20 years and older: ? A BMI below 18.5 is considered underweight. ? A BMI of 18.5 to 24.9 is normal. ? A BMI of 25 to 29.9 is considered overweight. ? A BMI of 30 and above is considered obese.   . Maintain normal blood lipids and cholesterol levels by exercising and minimizing your intake of saturated fat. Eat a balanced diet with plenty of fruit and vegetables. Blood tests for lipids and cholesterol should begin at age 10 and be repeated every 5 years. If your lipid or cholesterol levels are high, you are over 50, or you are at high risk for heart disease, you may need  your cholesterol levels checked more frequently. Ongoing high lipid and cholesterol levels should be treated with medicines if diet and exercise are not working.  . If you smoke, find out from your health care provider how to quit. If you do not use tobacco, please do not start.  . If you choose to drink alcohol, please do not consume more than 2 drinks per day. One drink is considered to be 12 ounces (355 mL) of beer, 5 ounces (148 mL) of wine, or 1.5 ounces (44 mL) of liquor.  . If you are 41-26 years old, ask your health care provider if you  should take aspirin to prevent strokes.  . Use sunscreen. Apply sunscreen liberally and repeatedly throughout the day. You should seek shade when your shadow is shorter than you. Protect yourself by wearing long sleeves, pants, a wide-brimmed hat, and sunglasses year round, whenever you are outdoors.  . Once a month, do a whole body skin exam, using a mirror to look at the skin on your back. Tell your health care provider of new moles, moles that have irregular borders, moles that are larger than a pencil eraser, or moles that have changed in shape or color.

## 2018-07-30 NOTE — Progress Notes (Signed)
Subjective:   Victoria Allison is a 78 y.o. female who presents for Medicare Annual (Subsequent) preventive examination.  Review of Systems:  N/A Cardiac Risk Factors include: advanced age (>62men, >83 women);dyslipidemia     Objective:     Vitals: LMP 02/29/1968   There is no height or weight on file to calculate BMI.  Advanced Directives 07/30/2018 08/22/2017 07/12/2017 07/08/2016 05/24/2016 05/19/2016 07/07/2015  Does Patient Have a Medical Advance Directive? Yes Yes Yes Yes Yes Yes Yes  Type of Paramedic of [place];Living will Living will;Healthcare Power of [place];Living will [place];Living will [place];Living will [place];Living will [place];Living will  Does patient want to make changes to medical advance directive? - - - - - No - Patient declined -  Copy of [place] in Chart? No - copy requested No - copy requested No - copy requested No - copy requested - Yes -  Pre-existing out of facility DNR order (yellow form or pink MOST form) - - - - - - -    Tobacco Social History   Tobacco Use  Smoking Status Former Smoker  . Last attempt to quit: 02/29/1968  . Years since quitting: 50.4  Smokeless Tobacco Never Used     Counseling given: No   Clinical Intake:  Pre-visit preparation completed: Yes  Pain : No/denies pain Pain Score: 0-No pain     Nutritional Status: BMI of 19-24  Normal Nutritional Risks: None Diabetes: No  How often do you need to have someone help you when you read instructions, pamphlets, or other written materials from your doctor or pharmacy?: 1 - Never What is the last grade level you completed in school?: 11th grade  Interpreter Needed?: No  Comments: pt lives with spouse Information entered by :: LPinson, LPN  Past Medical History:  Diagnosis Date  . Allergic rhinitis   . CAD (coronary  artery disease)   . Cancer ([place])    skin  . Chronic kidney disease    overactive bladder  . Colon polyp   . Diverticulosis    sigmoid and descending colon  . DM (diabetes mellitus) ([place])    no longer take any diabetic meds now that off Prednisone, type 2 diet controlled  . Elevated LFTs over one year ago  . Esophageal ring   . GERD (gastroesophageal reflux disease)   . History of hepatitis 1975   unsure of type  . Hyperlipemia   . Hypertension   . Internal hemorrhoids   . Lichen sclerosus 30/86   (DX BY GYN AFTER ABN PAP)  . Pemphigoid, cicatricial    D/O AFFECTING EYES, MOUTH, THROAT NOW (DX [facility])  . Shingles 03/13/2013  . Urinary incontinence    OVERACTIVE BLADDER   Past Surgical History:  Procedure Laterality Date  . ABDOMINAL HYSTERECTOMY    . ANGIOPLASTY  yrs ago   LAD  . CARDIAC ELECTROPHYSIOLOGY MAPPING AND ABLATION    . CATARACT EXTRACTION Bilateral   . COLONOSCOPY    . ESOPHAGOGASTRODUODENOSCOPY  09/14/2011   Procedure: ESOPHAGOGASTRODUODENOSCOPY (EGD);  Surgeon: Gatha Mayer, MD;  Location: [facility];  Service: Endoscopy;  Laterality: N/A;  needs xray  . ESOPHAGOGASTRODUODENOSCOPY N/A 01/02/2013   Procedure: ESOPHAGOGASTRODUODENOSCOPY (EGD);  Surgeon: Gatha Mayer, MD;  Location: [facility];  Service: Endoscopy;  Laterality: N/A;  . ESOPHAGOGASTRODUODENOSCOPY N/A 04/12/2013   Procedure: ESOPHAGOGASTRODUODENOSCOPY (EGD);  Surgeon: Gatha Mayer, MD;  Location: [facility];  Service: Endoscopy;  Laterality: N/A;  . ESOPHAGOGASTRODUODENOSCOPY (EGD) WITH PROPOFOL N/A 05/24/2016   Procedure: ESOPHAGOGASTRODUODENOSCOPY (EGD) WITH PROPOFOL;  Surgeon: Gatha Mayer, MD;  Location: [facility];  Service: Endoscopy;  Laterality: N/A;  . ESOPHAGOGASTRODUODENOSCOPY (EGD) WITH PROPOFOL N/A 08/22/2017   Procedure: ESOPHAGOGASTRODUODENOSCOPY (EGD) WITH PROPOFOL;  Surgeon: Gatha Mayer, MD;  Location: [facility];  Service: Endoscopy;  Laterality: N/A;  . Venia Minks  DILATION N/A 01/02/2013   Procedure: Venia Minks DILATION;  Surgeon: Gatha Mayer, MD;  Location: [facility];  Service: Endoscopy;  Laterality: N/A;  . Venia Minks DILATION N/A 05/24/2016   Procedure: Venia Minks DILATION;  Surgeon: Gatha Mayer, MD;  Location: [facility];  Service: Endoscopy;  Laterality: N/A;  . Venia Minks DILATION N/A 08/22/2017   Procedure: Venia Minks DILATION;  Surgeon: Gatha Mayer, MD;  Location: [facility];  Service: Endoscopy;  Laterality: N/A;  . NOSE SURGERY     Biopsy  . OOPHORECTOMY    . SAVORY DILATION  09/14/2011   Procedure: SAVORY DILATION;  Surgeon: Gatha Mayer, MD;  Location: [facility];  Service: Endoscopy;  Laterality: N/A;  . SAVORY DILATION N/A 01/02/2013   Procedure: SAVORY DILATION;  Surgeon: Gatha Mayer, MD;  Location: [facility];  Service: Endoscopy;  Laterality: N/A;  . SAVORY DILATION N/A 04/12/2013   Procedure: SAVORY DILATION;  Surgeon: Gatha Mayer, MD;  Location: [facility];  Service: Endoscopy;  Laterality: N/A;  . skin cancer removal  02/2016   Family History  Problem Relation Age of Onset  . Lung cancer Father   . Esophageal cancer Son 72  . Stroke Mother        CVA  . Alzheimer's disease Mother   . Hypertension Brother   . Stomach cancer Son 54  . Breast cancer Maternal Aunt   . Colon cancer Neg Hx   . Rectal cancer Neg Hx    Social History   Socioeconomic History  . Marital status: Married    Spouse name: Not on file  . Number of children: 2  . Years of education: Not on file  . Highest education level: Not on file  Occupational History  . Occupation: Retired    Fish farm manager: RETIRED  Social Needs  . Financial resource strain: Not on file  . Food insecurity:    Worry: Not on file    Inability: Not on file  . Transportation needs:    Medical: Not on file    Non-medical: Not on file  Tobacco Use  . Smoking status: Former Smoker    Last attempt to quit: 02/29/1968    Years since quitting: 50.4  . Smokeless tobacco:  Never Used  Substance and Sexual Activity  . Alcohol use: No    Alcohol/week: 0.0 standard drinks  . Drug use: No  . Sexual activity: Not Currently  Lifestyle  . Physical activity:    Days per week: Not on file    Minutes per session: Not on file  . Stress: Not on file  Relationships  . Social connections:    Talks on phone: Not on file    Gets together: Not on file    Attends religious service: Not on file    Active member of club or organization: Not on file    Attends meetings of clubs or organizations: Not on file    Relationship status: Not on file  Other Topics Concern  . Not on file  Social History Narrative   Husband is fighting  cancer. Exercises on stationary bike/walks   Daily caffeine     Outpatient Encounter Medications as of 07/30/2018  Medication Sig  . acetaminophen (TYLENOL) 500 MG tablet Take 500 mg by mouth daily as needed for moderate pain or headache.  . brimonidine (ALPHAGAN) 0.2 % ophthalmic solution Place 1 drop into both eyes daily.  . fluticasone (FLONASE) 50 MCG/ACT nasal spray Place 2 sprays into both nostrils daily as needed for allergies.  Marland Kitchen lisinopril (PRINIVIL,ZESTRIL) 10 MG tablet Take 1 tablet (10 mg total) by mouth daily.  Marland Kitchen omeprazole (PRILOSEC) 40 MG capsule Take 1 capsule (40 mg total) by mouth daily.  Marland Kitchen oxybutynin (DITROPAN XL) 15 MG 24 hr tablet Take 1 tablet (15 mg total) by mouth daily.  . pravastatin (PRAVACHOL) 40 MG tablet Take 1 tablet (40 mg total) by mouth daily.  . sodium chloride (OCEAN) 0.65 % SOLN nasal spray Place 1 spray into both nostrils as needed for congestion.  . timolol (BETIMOL) 0.5 % ophthalmic solution Place 1 drop into both eyes 2 (two) times daily.   . prednisoLONE acetate (OMNIPRED) 1 % ophthalmic suspension Place 1 drop into both eyes daily.    No facility-administered encounter medications on file as of 07/30/2018.     Activities of Daily Living In your present state of health, do you have any difficulty  performing the following activities: 07/30/2018  Hearing? N  Vision? N  Difficulty concentrating or making decisions? N  Walking or climbing stairs? N  Dressing or bathing? N  Doing errands, shopping? N  Preparing Food and eating ? N  Using the Toilet? N  In the past six months, have you accidently leaked urine? N  Do you have problems with loss of bowel control? N  Managing your Medications? N  Managing your Finances? N  Housekeeping or managing your Housekeeping? N  Some recent data might be hidden    Patient Care Team: Tower, Wynelle Fanny, MD as PCP - General Dingeldein, Remo Lipps, MD as Consulting Physician (Ophthalmology) Gatha Mayer, MD as Consulting Physician (Gastroenterology) Baxter Kail, MD as Consulting Physician (Dermatology)    Assessment:   This is a routine wellness examination for Aanyah.  Hearing Screening Comments: Bilateral hearing aids Vision Screening Comments: Last vision exam in May 2020 with Dr. Sandra Cockayne  Exercise Activities and Dietary recommendations Exercise limited by: None identified  Goals    . Increase physical activity     Starting 07/30/2018, I will continue to walk at least 60 minutes daily.        Fall Risk Fall Risk  07/30/2018 07/12/2017 07/08/2016 07/07/2015 12/13/2013  Falls in the past year? 0 No No No No   Depression Screen PHQ 2/9 Scores 07/30/2018 07/12/2017 07/08/2016 07/07/2015  PHQ - 2 Score 0 0 0 0  PHQ- 9 Score 0 0 - -     Cognitive Function MMSE - Mini Mental State Exam 07/30/2018 07/12/2017 07/08/2016 07/07/2015  Orientation to time 5 5 5 5   Orientation to Place 5 5 5 5   Registration 3 3 3 3   Attention/ Calculation 0 0 0 0  Recall 3 3 3 3   Language- name 2 objects 0 0 0 0  Language- repeat 1 1 1 1   Language- follow 3 step command 0 3 3 3   Language- read & follow direction 0 0 0 0  Write a sentence 0 0 0 0  Copy design 0 0 0 0  Total score 17 20 20 20        PLEASE  NOTE: A Mini-Cog screen was completed. Maximum score is 17. A  value of 0 denotes this part of [place] MMSE was not completed or the patient failed this part of the Mini-Cog screening.   Mini-Cog Screening Orientation to Time - Max 5 pts Orientation to Place - Max 5 pts Registration - Max 3 pts Recall - Max 3 pts Language Repeat - Max 1 pts    Immunization History  Administered Date(s) Administered  . Influenza Split 12/03/2010  . Influenza Whole 12/23/2008  . Influenza,inj,Quad PF,6+ Mos 12/10/2012, 12/13/2013, 02/01/2018  . Pneumococcal Conjugate-13 12/13/2013  . Pneumococcal Polysaccharide-23 12/23/2008  . Td 02/28/1998, 06/03/2008    Screening Tests Health Maintenance  Topic Date Due  . TETANUS/TDAP  02/28/2020 (Originally 06/04/2018)  . MAMMOGRAM  08/05/2018  . INFLUENZA VACCINE  09/29/2018  . DEXA SCAN  Completed  . PNA vac Low Risk Adult  Completed      Plan:     I have personally reviewed, addressed, and noted the following in the patient's chart:  A. Medical and social history B. Use of alcohol, tobacco or illicit drugs  C. Current medications and supplements D. Functional ability and status E.  Nutritional status F.  Physical activity G. Advance directives H. List of other physicians I.  Hospitalizations, surgeries, and ER visits in previous 12 months J.  Vitals (unless it is a telemedicine encounter) K. Screenings to include cognitive, depression, hearing, vision (NOTE: hearing and vision screenings not completed in telemedicine encounter) L. Referrals and appointments   In addition, I have reviewed and discussed with patient certain preventive protocols, quality metrics, and best practice recommendations. A written personalized care plan for preventive services and recommendations were provided to patient.  With patient's permission, we connected on 07/30/18 at  9:00 AM EDT. Interactive audio and video telecommunications were attempted with patient. This attempt was unsuccessful due to patient having technical  difficulties OR patient did not have access to video capability.  Encounter was completed with audio only.  Two patient identifiers were used to ensure the encounter occurred with the correct person. Patient was in home and writer was in office.    Signed,   Lindell Noe, MHA, BS, LPN Health Coach

## 2018-07-30 NOTE — Progress Notes (Signed)
PCP notes:   Health maintenance:  Tetanus vaccine - postponed/insurance  Abnormal screenings:   None  Patient concerns:   None  Nurse concerns:  None  Next PCP appt:   09/04/18 @ 1400  I reviewed health advisor's note, was available for consultation, and agree with documentation and plan. Loura Pardon MD

## 2018-07-31 ENCOUNTER — Encounter: Payer: Medicare Other | Admitting: Family Medicine

## 2018-08-20 DIAGNOSIS — H4063X0 Glaucoma secondary to drugs, bilateral, stage unspecified: Secondary | ICD-10-CM | POA: Diagnosis not present

## 2018-08-26 ENCOUNTER — Telehealth: Payer: Self-pay | Admitting: Family Medicine

## 2018-08-26 DIAGNOSIS — E785 Hyperlipidemia, unspecified: Secondary | ICD-10-CM

## 2018-08-26 DIAGNOSIS — R7309 Other abnormal glucose: Secondary | ICD-10-CM

## 2018-08-26 DIAGNOSIS — I10 Essential (primary) hypertension: Secondary | ICD-10-CM

## 2018-08-26 NOTE — Telephone Encounter (Signed)
-----   Message from Ellamae Sia sent at 08/22/2018  3:38 PM EDT ----- Regarding: Lab orders for Tuesday, 6.30.20 Lab orders

## 2018-08-28 ENCOUNTER — Other Ambulatory Visit (INDEPENDENT_AMBULATORY_CARE_PROVIDER_SITE_OTHER): Payer: Medicare Other

## 2018-08-28 ENCOUNTER — Other Ambulatory Visit: Payer: Self-pay

## 2018-08-28 DIAGNOSIS — E785 Hyperlipidemia, unspecified: Secondary | ICD-10-CM | POA: Diagnosis not present

## 2018-08-28 DIAGNOSIS — I10 Essential (primary) hypertension: Secondary | ICD-10-CM | POA: Diagnosis not present

## 2018-08-28 DIAGNOSIS — R7309 Other abnormal glucose: Secondary | ICD-10-CM | POA: Diagnosis not present

## 2018-08-28 LAB — COMPREHENSIVE METABOLIC PANEL
ALT: 12 U/L (ref 0–35)
AST: 17 U/L (ref 0–37)
Albumin: 3.8 g/dL (ref 3.5–5.2)
Alkaline Phosphatase: 84 U/L (ref 39–117)
BUN: 13 mg/dL (ref 6–23)
CO2: 28 mEq/L (ref 19–32)
Calcium: 9.4 mg/dL (ref 8.4–10.5)
Chloride: 103 mEq/L (ref 96–112)
Creatinine, Ser: 0.77 mg/dL (ref 0.40–1.20)
GFR: 72.11 mL/min (ref >60.00)
Glucose, Bld: 88 mg/dL (ref 70–99)
Potassium: 4.7 mEq/L (ref 3.5–5.1)
Sodium: 137 mEq/L (ref 135–145)
Total Bilirubin: 0.3 mg/dL (ref 0.2–1.2)
Total Protein: 6.7 g/dL (ref 6.0–8.3)

## 2018-08-28 LAB — LIPID PANEL
Cholesterol: 155 mg/dL (ref 0–200)
HDL: 46.6 mg/dL (ref >39.00)
LDL Cholesterol: 87 mg/dL (ref 0–99)
NonHDL: 108.24
Total CHOL/HDL Ratio: 3
Triglycerides: 108 mg/dL (ref 0.0–149.0)
VLDL: 21.6 mg/dL (ref 0.0–40.0)

## 2018-08-28 LAB — CBC WITH DIFFERENTIAL/PLATELET
Basophils Absolute: 0 10*3/uL (ref 0.0–0.1)
Basophils Relative: 0.9 % (ref 0.0–3.0)
Eosinophils Absolute: 0.1 10*3/uL (ref 0.0–0.7)
Eosinophils Relative: 2.5 % (ref 0.0–5.0)
HCT: 34.9 % — ABNORMAL LOW (ref 36.0–46.0)
Hemoglobin: 11.5 g/dL — ABNORMAL LOW (ref 12.0–15.0)
Lymphocytes Relative: 31.6 % (ref 12.0–46.0)
Lymphs Abs: 1.4 10*3/uL (ref 0.7–4.0)
MCHC: 32.8 g/dL (ref 30.0–36.0)
MCV: 84.2 fl (ref 78.0–100.0)
Monocytes Absolute: 0.3 10*3/uL (ref 0.1–1.0)
Monocytes Relative: 7.7 % (ref 3.0–12.0)
Neutro Abs: 2.6 10*3/uL (ref 1.4–7.7)
Neutrophils Relative %: 57.3 % (ref 43.0–77.0)
Platelets: 193 10*3/uL (ref 150.0–400.0)
RBC: 4.15 Mil/uL (ref 3.87–5.11)
RDW: 14.6 % (ref 11.5–15.5)
WBC: 4.5 10*3/uL (ref 4.0–10.5)

## 2018-08-28 LAB — TSH: TSH: 2.97 u[IU]/mL (ref 0.35–4.50)

## 2018-08-28 LAB — HEMOGLOBIN A1C: Hgb A1c MFr Bld: 6 % (ref 4.6–6.5)

## 2018-09-03 DIAGNOSIS — L121 Cicatricial pemphigoid: Secondary | ICD-10-CM | POA: Diagnosis not present

## 2018-09-04 ENCOUNTER — Ambulatory Visit (INDEPENDENT_AMBULATORY_CARE_PROVIDER_SITE_OTHER): Payer: Medicare Other | Admitting: Family Medicine

## 2018-09-04 ENCOUNTER — Other Ambulatory Visit: Payer: Self-pay

## 2018-09-04 ENCOUNTER — Encounter: Payer: Self-pay | Admitting: Family Medicine

## 2018-09-04 VITALS — BP 122/76 | HR 72 | Temp 98.0°F | Wt 114.1 lb

## 2018-09-04 DIAGNOSIS — R7309 Other abnormal glucose: Secondary | ICD-10-CM | POA: Diagnosis not present

## 2018-09-04 DIAGNOSIS — K222 Esophageal obstruction: Secondary | ICD-10-CM | POA: Diagnosis not present

## 2018-09-04 DIAGNOSIS — I10 Essential (primary) hypertension: Secondary | ICD-10-CM

## 2018-09-04 DIAGNOSIS — E785 Hyperlipidemia, unspecified: Secondary | ICD-10-CM

## 2018-09-04 DIAGNOSIS — L129 Pemphigoid, unspecified: Secondary | ICD-10-CM

## 2018-09-04 MED ORDER — PRAVASTATIN SODIUM 40 MG PO TABS: 40.0000 mg | ORAL_TABLET | Freq: Every day | ORAL | 3 refills | Status: DC

## 2018-09-04 MED ORDER — OMEPRAZOLE 40 MG PO CPDR: 40.0000 mg | DELAYED_RELEASE_CAPSULE | Freq: Every day | ORAL | 3 refills | Status: DC

## 2018-09-04 MED ORDER — OXYBUTYNIN CHLORIDE ER 15 MG PO TB24: 15.0000 mg | ORAL_TABLET | Freq: Every day | ORAL | 3 refills | Status: DC

## 2018-09-04 NOTE — Assessment & Plan Note (Signed)
Doing better after last EGD a year ago Continues ppi

## 2018-09-04 NOTE — Assessment & Plan Note (Signed)
Currently no tx  No gum tissue left-has posts to keep denture plates in

## 2018-09-04 NOTE — Assessment & Plan Note (Signed)
bp in fair control at this time  BP Readings from Last 1 Encounters:  09/04/18 122/76   No changes needed Most recent labs reviewed  Disc lifstyle change with low sodium diet and exercise

## 2018-09-04 NOTE — Assessment & Plan Note (Signed)
Lab Results  Component Value Date   HGBA1C 6.0 08/28/2018   disc imp of low glycemic diet and wt loss to prevent DM2

## 2018-09-04 NOTE — Assessment & Plan Note (Signed)
Disc goals for lipids and reasons to control them Rev last labs with pt Rev low sat fat diet in detail Controlled with statin and diet  LDL was down

## 2018-09-04 NOTE — Patient Instructions (Addendum)
Try to watch sugar in your diet to prevent diabetes Try to get most of your carbohydrates from produce (with the exception of white potatoes)  Eat less bread/pasta/rice/snack foods/cereals/sweets and other items from the middle of the grocery store (processed carbs)   Keep eating regular meals   Take care of yourself   Be careful in the heat- make sure to drink enough fluids  Get more help from your family with your husband's care as well   Try to get 1200-1500 mg of calcium per day with at least 1000 iu of vitamin D - for bone health This is very important to prevent broken bones If the calcium causes worse constipation then use the vitamin D alone

## 2018-09-04 NOTE — Progress Notes (Signed)
Subjective:    Patient ID: Victoria Allison, female    DOB: [birth year], 78 y.o.   MRN: [id number]  HPI Here for annual f/u of chronic health problems  Doing ok overall  Taking care of her husband and take him to doctor /hosp ([facility])  Home care comes out periodically    Weight  Wt Readings from Last 3 Encounters:  09/04/18 114 lb 2 oz (51.8 kg)  04/11/18 116 lb 9 oz (52.9 kg)  03/29/18 115 lb 12 oz (52.5 kg)  22.29 kg/m   Getting outdoors /exercise - doing all the yardwork Is careful in the heat   amw on 6/1 No gaps or concerns   Zoster status - is not interested in a vaccine   Mammogram 6/19 - declines one this year  Self breast exam   dexa 5/10 -no falls or fractures  Very active   bp is stable today  No cp or palpitations or headaches or edema  No side effects to medicines  BP Readings from Last 3 Encounters:  09/04/18 122/76  04/11/18 102/60  03/29/18 (!) 144/62    Had EGD last June  Has esoph rings  She has some swallowing issues at times  Gums has disintegrated from her pemphoigoid   Colonoscopy 2010  No bowel changes  miralax helps with constipation     Lab Results  Component Value Date   CREATININE 0.77 08/28/2018   BUN 13 08/28/2018   NA 137 08/28/2018   K 4.7 08/28/2018   CL 103 08/28/2018   CO2 28 08/28/2018   Lab Results  Component Value Date   WBC 4.5 08/28/2018   HGB 11.5 (L) 08/28/2018   HCT 34.9 (L) 08/28/2018   MCV 84.2 08/28/2018   PLT 193.0 08/28/2018   Has been mildly anemic before-fairly stable  Eating balanced diet  No bleeding anywhere  Lab Results  Component Value Date   ALT 12 08/28/2018   AST 17 08/28/2018   ALKPHOS 84 08/28/2018   BILITOT 0.3 08/28/2018    Lab Results  Component Value Date   TSH 2.97 08/28/2018    Prediabetes Lab Results  Component Value Date   HGBA1C 6.0 08/28/2018  has sweets occasionally  She had a frosty today   Hyperlipidemia Lab Results  Component Value Date   CHOL 155  08/28/2018   CHOL 184 07/12/2017   CHOL 169 07/08/2016   Lab Results  Component Value Date   HDL 46.60 08/28/2018   HDL 50.50 07/12/2017   HDL 50.20 07/08/2016   Lab Results  Component Value Date   LDLCALC 87 08/28/2018   LDLCALC 101 (H) 07/12/2017   LDLCALC 82 07/08/2016   Lab Results  Component Value Date   TRIG 108.0 08/28/2018   TRIG 161.0 (H) 07/12/2017   TRIG 181.0 (H) 07/08/2016   Lab Results  Component Value Date   CHOLHDL 3 08/28/2018   CHOLHDL 4 07/12/2017   CHOLHDL 3 07/08/2016   Lab Results  Component Value Date   LDLDIRECT 184.0 11/25/2010   LDLDIRECT 86.7 02/04/2009   pravastatin and diet Ratio went down LDL is down    Patient Active Problem List   Diagnosis Date Noted  . Laryngitis, acute 04/11/2018  . Constipation 08/07/2017  . Bowel habit changes 07/19/2017  . Screening mammogram, encounter for 07/19/2017  . Elevated glucose 07/19/2017  . Cramp in muscle 04/22/2014  . Colon cancer screening 12/13/2013  . Dysphagia 04/12/2013  . Encounter for Medicare annual wellness exam 12/10/2012  .  Esophageal rings 08/24/2011  . SUPRAVENTRICULAR TACHYCARDIA 09/22/2009  . ARTHRITIS, SHOULDER 05/21/2009  . INSOMNIA 02/05/2009  . POSTMENOPAUSAL STATUS 06/02/2008  . PEMPHIGOID 11/14/2007  . Nonspecific (abnormal) findings on radiological and other examination of body structure 07/05/2007  . Hyperlipidemia LDL goal <100 03/13/2007  . Essential hypertension 03/13/2007  . CORONARY ARTERY DISEASE 03/13/2007  . Allergic rhinitis 03/13/2007  . GERD 03/13/2007  . HAIR LOSS 03/13/2007  . LEG PAIN, CHRONIC 03/13/2007  . URINARY INCONTINENCE 03/13/2007  . COLONIC POLYPS, HX OF 03/13/2007  . HX, URINARY INFECTION 08/14/2006   Past Medical History:  Diagnosis Date  . Allergic rhinitis   . CAD (coronary artery disease)   . Cancer ([place])    skin  . Chronic kidney disease    overactive bladder  . Colon polyp   . Diverticulosis    sigmoid and descending colon   . DM (diabetes mellitus) ([place])    no longer take any diabetic meds now that off Prednisone, type 2 diet controlled  . Elevated LFTs over one year ago  . Esophageal ring   . GERD (gastroesophageal reflux disease)   . History of hepatitis 1975   unsure of type  . Hyperlipemia   . Hypertension   . Internal hemorrhoids   . Lichen sclerosus 93/23   (DX BY GYN AFTER ABN PAP)  . Pemphigoid, cicatricial    D/O AFFECTING EYES, MOUTH, THROAT NOW (DX [facility])  . Shingles 03/13/2013  . Urinary incontinence    OVERACTIVE BLADDER   Past Surgical History:  Procedure Laterality Date  . ABDOMINAL HYSTERECTOMY    . ANGIOPLASTY  yrs ago   LAD  . CARDIAC ELECTROPHYSIOLOGY MAPPING AND ABLATION    . CATARACT EXTRACTION Bilateral   . COLONOSCOPY    . ESOPHAGOGASTRODUODENOSCOPY  09/14/2011   Procedure: ESOPHAGOGASTRODUODENOSCOPY (EGD);  Surgeon: Gatha Mayer, MD;  Location: [facility];  Service: Endoscopy;  Laterality: N/A;  needs xray  . ESOPHAGOGASTRODUODENOSCOPY N/A 01/02/2013   Procedure: ESOPHAGOGASTRODUODENOSCOPY (EGD);  Surgeon: Gatha Mayer, MD;  Location: [facility];  Service: Endoscopy;  Laterality: N/A;  . ESOPHAGOGASTRODUODENOSCOPY N/A 04/12/2013   Procedure: ESOPHAGOGASTRODUODENOSCOPY (EGD);  Surgeon: Gatha Mayer, MD;  Location: [facility];  Service: Endoscopy;  Laterality: N/A;  . ESOPHAGOGASTRODUODENOSCOPY (EGD) WITH PROPOFOL N/A 05/24/2016   Procedure: ESOPHAGOGASTRODUODENOSCOPY (EGD) WITH PROPOFOL;  Surgeon: Gatha Mayer, MD;  Location: [facility];  Service: Endoscopy;  Laterality: N/A;  . ESOPHAGOGASTRODUODENOSCOPY (EGD) WITH PROPOFOL N/A 08/22/2017   Procedure: ESOPHAGOGASTRODUODENOSCOPY (EGD) WITH PROPOFOL;  Surgeon: Gatha Mayer, MD;  Location: [facility];  Service: Endoscopy;  Laterality: N/A;  . Venia Minks DILATION N/A 01/02/2013   Procedure: Venia Minks DILATION;  Surgeon: Gatha Mayer, MD;  Location: [facility];  Service: Endoscopy;  Laterality: N/A;  . Venia Minks  DILATION N/A 05/24/2016   Procedure: Venia Minks DILATION;  Surgeon: Gatha Mayer, MD;  Location: [facility];  Service: Endoscopy;  Laterality: N/A;  . Venia Minks DILATION N/A 08/22/2017   Procedure: Venia Minks DILATION;  Surgeon: Gatha Mayer, MD;  Location: [facility];  Service: Endoscopy;  Laterality: N/A;  . NOSE SURGERY     Biopsy  . OOPHORECTOMY    . SAVORY DILATION  09/14/2011   Procedure: SAVORY DILATION;  Surgeon: Gatha Mayer, MD;  Location: [facility];  Service: Endoscopy;  Laterality: N/A;  . SAVORY DILATION N/A 01/02/2013   Procedure: SAVORY DILATION;  Surgeon: Gatha Mayer, MD;  Location: [facility];  Service: Endoscopy;  Laterality: N/A;  . SAVORY DILATION N/A  04/12/2013   Procedure: SAVORY DILATION;  Surgeon: Gatha Mayer, MD;  Location: [facility];  Service: Endoscopy;  Laterality: N/A;  . skin cancer removal  02/2016   Social History   Tobacco Use  . Smoking status: Former Smoker    Quit date: 02/29/1968    Years since quitting: 50.5  . Smokeless tobacco: Never Used  Substance Use Topics  . Alcohol use: No    Alcohol/week: 0.0 standard drinks  . Drug use: No   Family History  Problem Relation Age of Onset  . Lung cancer Father   . Esophageal cancer Son 35  . Stroke Mother        CVA  . Alzheimer's disease Mother   . Hypertension Brother   . Stomach cancer Son 58  . Breast cancer Maternal Aunt   . Colon cancer Neg Hx   . Rectal cancer Neg Hx    Allergies  Allergen Reactions  . Aspirin Other (See Comments)    Bleeds too easily  . Lipitor [Atorvastatin] Other (See Comments)    Elevated LFT's  . Ultram [Tramadol] Other (See Comments)    Dizziness  . Adhesive  [Tape] Rash  . Latex Itching and Rash  . Tetanus Toxoid Rash   Current Outpatient Medications on File Prior to Visit  Medication Sig Dispense Refill  . acetaminophen (TYLENOL) 500 MG tablet Take 500 mg by mouth daily as needed for moderate pain or headache.    . brimonidine (ALPHAGAN) 0.2  % ophthalmic solution Place 1 drop into both eyes daily.    . fluticasone (FLONASE) 50 MCG/ACT nasal spray Place 2 sprays into both nostrils daily as needed for allergies. 16 g 11  . lisinopril (ZESTRIL) 10 MG tablet Take 1 tablet by mouth once daily 90 tablet 1  . prednisoLONE acetate (OMNIPRED) 1 % ophthalmic suspension Place 1 drop into both eyes daily.     . sodium chloride (OCEAN) 0.65 % SOLN nasal spray Place 1 spray into both nostrils as needed for congestion.    . timolol (BETIMOL) 0.5 % ophthalmic solution Place 1 drop into both eyes 2 (two) times daily.      No current facility-administered medications on file prior to visit.     Review of Systems  Constitutional: Negative for activity change, appetite change, fatigue, fever and unexpected weight change.  HENT: Negative for congestion, ear pain, rhinorrhea, sinus pressure and sore throat.        No gum tissue left due to pemphigoid  Swallowing problems have decreased since EGD a year ago  Eyes: Negative for pain, redness and visual disturbance.  Respiratory: Negative for cough, shortness of breath and wheezing.   Cardiovascular: Negative for chest pain and palpitations.  Gastrointestinal: Negative for abdominal pain, blood in stool, constipation and diarrhea.  Endocrine: Negative for polydipsia and polyuria.  Genitourinary: Negative for dysuria, frequency and urgency.  Musculoskeletal: Negative for arthralgias, back pain and myalgias.  Skin: Negative for pallor and rash.  Allergic/Immunologic: Negative for environmental allergies.  Neurological: Negative for dizziness, syncope and headaches.  Hematological: Negative for adenopathy. Does not bruise/bleed easily.  Psychiatric/Behavioral: Negative for decreased concentration and dysphoric mood. The patient is not nervous/anxious.        Caregiver stress-husband       Objective:   Physical Exam Constitutional:      General: She is not in acute distress.    Appearance: Normal  appearance. She is well-developed and normal weight. She is not ill-appearing or diaphoretic.  HENT:  Head: Normocephalic and atraumatic.     Right Ear: External ear normal.     Left Ear: External ear normal.     Ears:     Comments: Hearing aides    Mouth/Throat:     Mouth: Mucous membranes are moist.     Pharynx: Oropharynx is clear. No oropharyngeal exudate or posterior oropharyngeal erythema.     Comments: No gum tissue apparent under denture plate  Eyes:     General: No scleral icterus.    Conjunctiva/sclera: Conjunctivae normal.     Pupils: Pupils are equal, round, and reactive to light.  Neck:     Musculoskeletal: Normal range of motion and neck supple.     Thyroid: No thyromegaly.     Vascular: No carotid bruit or JVD.  Cardiovascular:     Rate and Rhythm: Normal rate and regular rhythm.     Pulses: Normal pulses.     Heart sounds: Normal heart sounds. No murmur. No gallop.   Pulmonary:     Effort: Pulmonary effort is normal. No respiratory distress.     Breath sounds: Normal breath sounds. No wheezing.  Chest:     Chest wall: No tenderness.  Abdominal:     General: Abdomen is flat. Bowel sounds are normal. There is no distension or abdominal bruit.     Palpations: Abdomen is soft. There is no mass.     Tenderness: There is no abdominal tenderness.  Genitourinary:    Comments: Breast exam: No mass, nodules, thickening, tenderness, bulging, retraction, inflamation, nipple discharge or skin changes noted.  No axillary or clavicular LA.     Musculoskeletal: Normal range of motion.        General: No tenderness.     Right lower leg: No edema.     Left lower leg: No edema.  Lymphadenopathy:     Cervical: No cervical adenopathy.  Skin:    General: Skin is warm and dry.     Coloration: Skin is not pale.     Findings: No erythema or rash.  Neurological:     Mental Status: She is alert.     Cranial Nerves: No cranial nerve deficit.     Motor: No abnormal muscle tone.      Coordination: Coordination normal.     Gait: Gait normal.     Deep Tendon Reflexes: Reflexes are normal and symmetric. Reflexes normal.  Psychiatric:        Mood and Affect: Mood normal.        Cognition and Memory: Cognition and memory normal.     Comments: Pleasant, cheerful and talkative           Assessment & Plan:   Problem List Items Addressed This Visit      Cardiovascular and Mediastinum   Essential hypertension - Primary    bp in fair control at this time  BP Readings from Last 1 Encounters:  09/04/18 122/76   No changes needed Most recent labs reviewed  Disc lifstyle change with low sodium diet and exercise        Relevant Medications   pravastatin (PRAVACHOL) 40 MG tablet     Digestive   Esophageal rings    Doing better after last EGD a year ago Continues ppi        Musculoskeletal and Integument   PEMPHIGOID    Currently no tx  No gum tissue left-has posts to keep denture plates in          Other  Hyperlipidemia LDL goal <100    Disc goals for lipids and reasons to control them Rev last labs with pt Rev low sat fat diet in detail Controlled with statin and diet  LDL was down      Relevant Medications   pravastatin (PRAVACHOL) 40 MG tablet   Elevated glucose    Lab Results  Component Value Date   HGBA1C 6.0 08/28/2018   disc imp of low glycemic diet and wt loss to prevent DM2

## 2018-10-01 DIAGNOSIS — L121 Cicatricial pemphigoid: Secondary | ICD-10-CM | POA: Diagnosis not present

## 2018-10-30 ENCOUNTER — Ambulatory Visit (INDEPENDENT_AMBULATORY_CARE_PROVIDER_SITE_OTHER): Payer: Medicare Other | Admitting: Family Medicine

## 2018-10-30 ENCOUNTER — Other Ambulatory Visit: Payer: Self-pay

## 2018-10-30 ENCOUNTER — Encounter: Payer: Self-pay | Admitting: Family Medicine

## 2018-10-30 DIAGNOSIS — R490 Dysphonia: Secondary | ICD-10-CM | POA: Diagnosis not present

## 2018-10-30 NOTE — Progress Notes (Signed)
Virtual Visit via Telephone Note  I connected with Victoria Allison on 10/30/18 at  8:00 AM EDT by telephone and verified that I am speaking with the correct person using two identifiers.  Location: Patient: home Provider: office    I discussed the limitations, risks, security and privacy concerns of performing an evaluation and management service by telephone and the availability of in person appointments. I also discussed with the patient that there may be a patient responsible charge related to this service. The patient expressed understanding and agreed to proceed.   History of Present Illness: Pt presents with hoarseness for 2 months  Throat is sore - hurts when she tries to talk  Has to chew well to get food down   Does not hurt to swallow  No dry mouth  Some congestion /nasal every night  No sinus pain   Ears do not hurt - did get new hearing aides but they do not work well on the phone no cough  No sob or wheezing    Allergies are not a big problem- sneezes occasionally  Still uses nasal spray-flonase Also saline   No heartburn at all   Does not have ENT right now  She pref [name]    Husband passed away in 2022-10-23  Was caring for him   gerd-omeprazole controls well/no symptoms at all   Oral pemphigoid has destroyed gum and some bone in mouth /no longer on tx for this   No fever  Took temp 97.5  Feels ok except for ST and hoarseness  No coating on tongue  No loss of taste/smell No GI symptoms   Patient Active Problem List   Diagnosis Date Noted  . Hoarseness, persistent 10/30/2018  . Laryngitis, acute 04/11/2018  . Constipation 08/07/2017  . Bowel habit changes 07/19/2017  . Screening mammogram, encounter for 07/19/2017  . Elevated glucose 07/19/2017  . Cramp in muscle 04/22/2014  . Colon cancer screening 12/13/2013  . Dysphagia 04/12/2013  . Encounter for Medicare annual wellness exam 12/10/2012  . Esophageal rings 08/24/2011  . SUPRAVENTRICULAR  TACHYCARDIA 10-22-09  . ARTHRITIS, SHOULDER 05/21/2009  . INSOMNIA 02/05/2009  . POSTMENOPAUSAL STATUS 06/02/2008  . PEMPHIGOID 11/14/2007  . Nonspecific (abnormal) findings on radiological and other examination of body structure 07/05/2007  . Hyperlipidemia LDL goal <100 03/13/2007  . Essential hypertension 03/13/2007  . CORONARY ARTERY DISEASE 03/13/2007  . Allergic rhinitis 03/13/2007  . GERD 03/13/2007  . HAIR LOSS 03/13/2007  . LEG PAIN, CHRONIC 03/13/2007  . URINARY INCONTINENCE 03/13/2007  . COLONIC POLYPS, HX OF 03/13/2007  . HX, URINARY INFECTION 08/14/2006   Past Medical History:  Diagnosis Date  . Allergic rhinitis   . CAD (coronary artery disease)   . Cancer ([place])    skin  . Chronic kidney disease    overactive bladder  . Colon polyp   . Diverticulosis    sigmoid and descending colon  . DM (diabetes mellitus) ([place])    no longer take any diabetic meds now that off Prednisone, type 2 diet controlled  . Elevated LFTs over one year ago  . Esophageal ring   . GERD (gastroesophageal reflux disease)   . History of hepatitis 1975   unsure of type  . Hyperlipemia   . Hypertension   . Internal hemorrhoids   . Lichen sclerosus 123456   (DX BY GYN AFTER ABN PAP)  . Pemphigoid, cicatricial    D/O AFFECTING EYES, MOUTH, THROAT NOW (DX [facility])  . Shingles 03/13/2013  .  Urinary incontinence    OVERACTIVE BLADDER   Past Surgical History:  Procedure Laterality Date  . ABDOMINAL HYSTERECTOMY    . ANGIOPLASTY  yrs ago   LAD  . CARDIAC ELECTROPHYSIOLOGY MAPPING AND ABLATION    . CATARACT EXTRACTION Bilateral   . COLONOSCOPY    . ESOPHAGOGASTRODUODENOSCOPY  09/14/2011   Procedure: ESOPHAGOGASTRODUODENOSCOPY (EGD);  Surgeon: Gatha Mayer, MD;  Location: [facility];  Service: Endoscopy;  Laterality: N/A;  needs xray  . ESOPHAGOGASTRODUODENOSCOPY N/A 01/02/2013   Procedure: ESOPHAGOGASTRODUODENOSCOPY (EGD);  Surgeon: Gatha Mayer, MD;  Location: [facility];  Service:  Endoscopy;  Laterality: N/A;  . ESOPHAGOGASTRODUODENOSCOPY N/A 04/12/2013   Procedure: ESOPHAGOGASTRODUODENOSCOPY (EGD);  Surgeon: Gatha Mayer, MD;  Location: [facility];  Service: Endoscopy;  Laterality: N/A;  . ESOPHAGOGASTRODUODENOSCOPY (EGD) WITH PROPOFOL N/A 05/24/2016   Procedure: ESOPHAGOGASTRODUODENOSCOPY (EGD) WITH PROPOFOL;  Surgeon: Gatha Mayer, MD;  Location: [facility];  Service: Endoscopy;  Laterality: N/A;  . ESOPHAGOGASTRODUODENOSCOPY (EGD) WITH PROPOFOL N/A 08/22/2017   Procedure: ESOPHAGOGASTRODUODENOSCOPY (EGD) WITH PROPOFOL;  Surgeon: Gatha Mayer, MD;  Location: [facility];  Service: Endoscopy;  Laterality: N/A;  . Venia Minks DILATION N/A 01/02/2013   Procedure: Venia Minks DILATION;  Surgeon: Gatha Mayer, MD;  Location: [facility];  Service: Endoscopy;  Laterality: N/A;  . Venia Minks DILATION N/A 05/24/2016   Procedure: Venia Minks DILATION;  Surgeon: Gatha Mayer, MD;  Location: [facility];  Service: Endoscopy;  Laterality: N/A;  . Venia Minks DILATION N/A 08/22/2017   Procedure: Venia Minks DILATION;  Surgeon: Gatha Mayer, MD;  Location: [facility];  Service: Endoscopy;  Laterality: N/A;  . NOSE SURGERY     Biopsy  . OOPHORECTOMY    . SAVORY DILATION  09/14/2011   Procedure: SAVORY DILATION;  Surgeon: Gatha Mayer, MD;  Location: [facility];  Service: Endoscopy;  Laterality: N/A;  . SAVORY DILATION N/A 01/02/2013   Procedure: SAVORY DILATION;  Surgeon: Gatha Mayer, MD;  Location: [facility];  Service: Endoscopy;  Laterality: N/A;  . SAVORY DILATION N/A 04/12/2013   Procedure: SAVORY DILATION;  Surgeon: Gatha Mayer, MD;  Location: [facility];  Service: Endoscopy;  Laterality: N/A;  . skin cancer removal  02/2016   Social History   Tobacco Use  . Smoking status: Former Smoker    Quit date: 02/29/1968    Years since quitting: 50.7  . Smokeless tobacco: Never Used  Substance Use Topics  . Alcohol use: No    Alcohol/week: 0.0 standard drinks  . Drug  use: No   Family History  Problem Relation Age of Onset  . Lung cancer Father   . Esophageal cancer Son 34  . Stroke Mother        CVA  . Alzheimer's disease Mother   . Hypertension Brother   . Stomach cancer Son 5  . Breast cancer Maternal Aunt   . Colon cancer Neg Hx   . Rectal cancer Neg Hx    Allergies  Allergen Reactions  . Aspirin Other (See Comments)    Bleeds too easily  . Lipitor [Atorvastatin] Other (See Comments)    Elevated LFT's  . Ultram [Tramadol] Other (See Comments)    Dizziness  . Adhesive  [Tape] Rash  . Latex Itching and Rash  . Tetanus Toxoid Rash   Current Outpatient Medications on File Prior to Visit  Medication Sig Dispense Refill  . acetaminophen (TYLENOL) 500 MG tablet Take 500 mg by mouth daily as needed for moderate pain or headache.    Marland Kitchen  brimonidine (ALPHAGAN) 0.2 % ophthalmic solution Place 1 drop into both eyes daily.    . fluticasone (FLONASE) 50 MCG/ACT nasal spray Place 2 sprays into both nostrils daily as needed for allergies. 16 g 11  . lisinopril (ZESTRIL) 10 MG tablet Take 1 tablet by mouth once daily 90 tablet 1  . omeprazole (PRILOSEC) 40 MG capsule Take 1 capsule (40 mg total) by mouth daily. 90 capsule 3  . oxybutynin (DITROPAN XL) 15 MG 24 hr tablet Take 1 tablet (15 mg total) by mouth daily. 90 tablet 3  . pravastatin (PRAVACHOL) 40 MG tablet Take 1 tablet (40 mg total) by mouth daily. 90 tablet 3  . prednisoLONE acetate (OMNIPRED) 1 % ophthalmic suspension Place 1 drop into both eyes daily.     . sodium chloride (OCEAN) 0.65 % SOLN nasal spray Place 1 spray into both nostrils as needed for congestion.    . timolol (BETIMOL) 0.5 % ophthalmic solution Place 1 drop into both eyes 2 (two) times daily.      No current facility-administered medications on file prior to visit.      Review of Systems  Constitutional: Negative for chills, fever and malaise/fatigue.  HENT: Positive for congestion, hearing loss and sore throat. Negative  for ear discharge, ear pain, nosebleeds, sinus pain and tinnitus.   Eyes: Negative for blurred vision, discharge and redness.  Respiratory: Negative for cough, sputum production, shortness of breath and wheezing.   Cardiovascular: Negative for chest pain, palpitations and leg swelling.  Gastrointestinal: Negative for abdominal pain, diarrhea, heartburn, nausea and vomiting.  Musculoskeletal: Negative for myalgias.  Skin: Negative for itching and rash.  Neurological: Negative for dizziness and headaches.  Psychiatric/Behavioral:       Lost husband in July  Doing ok with grief    Observations/Objective: Pt sounds hoarse Not distressed but sounds miserable  Is hard of hearing on the phone-I have to repeat myself frequently (she is wearing hearing aides) No cough or sob Nl affect , pleasant  Nl cognition-good historian   Assessment and Plan: Problem List Items Addressed This Visit      Other   Hoarseness, persistent    With ST for 2 months  Discomfort to swallow but not pain  Mild nasal congestion - allergies (not bad per pt) No fever or other symptoms  No GERD symptoms on her current omeprazole 40 mg daily  She has a h/o oral pemphigoid which has taken much of her gum tissue in mouth  ? If this could affect throat  Plan made to refer to ENT Continue saline and flonase inst to call if any new symptoms or if worse       Relevant Orders   Ambulatory referral to ENT       Follow Up Instructions: Watch for fever or cough or other new symptoms   I placed a referral to ENT to evaluate you for this sore throat and hoarseness  Our office will call you to set that up  Call if sore throat worsens    I discussed the assessment and treatment plan with the patient. The patient was provided an opportunity to ask questions and all were answered. The patient agreed with the plan and demonstrated an understanding of the instructions.   The patient was advised to call back or seek an  in-person evaluation if the symptoms worsen or if the condition fails to improve as anticipated.  I provided 16 minutes of non-face-to-face time during this encounter.   [employer]  Glori Bickers, MD

## 2018-10-30 NOTE — Assessment & Plan Note (Signed)
With ST for 2 months  Discomfort to swallow but not pain  Mild nasal congestion - allergies (not bad per pt) No fever or other symptoms  No GERD symptoms on her current omeprazole 40 mg daily  She has a h/o oral pemphigoid which has taken much of her gum tissue in mouth  ? If this could affect throat  Plan made to refer to ENT Continue saline and flonase inst to call if any new symptoms or if worse

## 2018-10-30 NOTE — Patient Instructions (Signed)
Watch for fever or cough or other new symptoms   I placed a referral to ENT to evaluate you for this sore throat and hoarseness  Our office will call you to set that up  Call if sore throat worsens

## 2018-10-31 ENCOUNTER — Telehealth: Payer: Self-pay | Admitting: Family Medicine

## 2018-10-31 ENCOUNTER — Other Ambulatory Visit: Payer: Self-pay

## 2018-10-31 DIAGNOSIS — Z20822 Contact with and (suspected) exposure to covid-19: Secondary | ICD-10-CM

## 2018-10-31 DIAGNOSIS — R490 Dysphonia: Secondary | ICD-10-CM

## 2018-10-31 DIAGNOSIS — R6889 Other general symptoms and signs: Secondary | ICD-10-CM | POA: Diagnosis not present

## 2018-10-31 NOTE — Telephone Encounter (Signed)
Sent Referral over to [facility] and they responded that your patient will need Covid Testing before they can see her in their office.

## 2018-10-31 NOTE — Telephone Encounter (Signed)
Pt was advised by Rosaria Ferries, she said she is heading that way now to get test done

## 2018-10-31 NOTE — Telephone Encounter (Signed)
I ordered the covid test Please call her to arrange her test  Thanks

## 2018-11-01 LAB — NOVEL CORONAVIRUS, NAA: SARS-CoV-2, NAA: NOT DETECTED

## 2018-11-02 ENCOUNTER — Telehealth: Payer: Self-pay | Admitting: Family Medicine

## 2018-11-02 NOTE — Telephone Encounter (Signed)
Pt called and said [facility] called and said the earliest they can schedule her is 9/24. She wanted to update Dr. Glori Bickers on this.

## 2018-11-04 NOTE — Telephone Encounter (Signed)
Thanks for letting me know  She may want to inquire about a cancellation call list  Let me know if symptoms worsen/become severe in the mean time

## 2018-11-06 NOTE — Telephone Encounter (Signed)
Patient returned call from the office  She was advised of Dr Marliss Coots comments  Patient will be calling [facility] now to see if there is a cancellation list

## 2018-11-06 NOTE — Telephone Encounter (Signed)
Left VM letting pt know Dr. Tower's comments  

## 2018-11-07 ENCOUNTER — Encounter: Payer: Self-pay | Admitting: Family Medicine

## 2018-11-07 ENCOUNTER — Ambulatory Visit (INDEPENDENT_AMBULATORY_CARE_PROVIDER_SITE_OTHER): Payer: Medicare Other | Admitting: Family Medicine

## 2018-11-07 ENCOUNTER — Other Ambulatory Visit: Payer: Self-pay

## 2018-11-07 VITALS — BP 130/70 | HR 71 | Temp 98.5°F | Ht 60.0 in | Wt 114.1 lb

## 2018-11-07 DIAGNOSIS — N3000 Acute cystitis without hematuria: Secondary | ICD-10-CM

## 2018-11-07 DIAGNOSIS — R300 Dysuria: Secondary | ICD-10-CM

## 2018-11-07 DIAGNOSIS — Z23 Encounter for immunization: Secondary | ICD-10-CM

## 2018-11-07 DIAGNOSIS — N390 Urinary tract infection, site not specified: Secondary | ICD-10-CM | POA: Insufficient documentation

## 2018-11-07 LAB — POC URINALSYSI DIPSTICK (AUTOMATED)
Bilirubin, UA: NEGATIVE
Glucose, UA: NEGATIVE
Ketones, UA: NEGATIVE
Nitrite, UA: POSITIVE
Protein, UA: NEGATIVE
Spec Grav, UA: 1.015 (ref 1.010–1.025)
Urobilinogen, UA: 0.2 E.U./dL
pH, UA: 6 (ref 5.0–8.0)

## 2018-11-07 MED ORDER — SULFAMETHOXAZOLE-TRIMETHOPRIM 800-160 MG PO TABS: 1.0000 | ORAL_TABLET | Freq: Two times a day (BID) | ORAL | 0 refills | Status: DC

## 2018-11-07 NOTE — Progress Notes (Signed)
Subjective:    Patient ID: Victoria Allison, female    DOB: [birth year], 78 y.o.   MRN: [id number]  HPI  Here for urinary symptoms   Symptoms started on Sunday -mild  Took azo that day  Pain in bladder area with pressure  Pain to urinate Frequency/urgency - incontinence  No blood in urine   No nausea  No fever  No flank pain    Wt Readings from Last 3 Encounters:  11/07/18 114 lb 2 oz (51.8 kg)  09/04/18 114 lb 2 oz (51.8 kg)  04/11/18 116 lb 9 oz (52.9 kg)   22.29 kg/m   Results for orders placed or performed in visit on 11/07/18  POCT Urinalysis Dipstick (Automated)  Result Value Ref Range   Color, UA Yellow    Clarity, UA Clear    Glucose, UA Negative Negative   Bilirubin, UA Negative    Ketones, UA Negative    Spec Grav, UA 1.015 1.010 - 1.025   Blood, UA Trace    pH, UA 6.0 5.0 - 8.0   Protein, UA Negative Negative   Urobilinogen, UA 0.2 0.2 or 1.0 E.U./dL   Nitrite, UA Positive    Leukocytes, UA Large (3+) (A) Negative     Patient Active Problem List   Diagnosis Date Noted  . UTI (urinary tract infection) 11/07/2018  . Hoarseness, persistent 10/30/2018  . Laryngitis, acute 04/11/2018  . Constipation 08/07/2017  . Bowel habit changes 07/19/2017  . Screening mammogram, encounter for 07/19/2017  . Elevated glucose 07/19/2017  . Cramp in muscle 04/22/2014  . Colon cancer screening 12/13/2013  . Dysphagia 04/12/2013  . Encounter for Medicare annual wellness exam 12/10/2012  . Esophageal rings 08/24/2011  . SUPRAVENTRICULAR TACHYCARDIA 09/22/2009  . ARTHRITIS, SHOULDER 05/21/2009  . INSOMNIA 02/05/2009  . POSTMENOPAUSAL STATUS 06/02/2008  . PEMPHIGOID 11/14/2007  . Nonspecific (abnormal) findings on radiological and other examination of body structure 07/05/2007  . Hyperlipidemia LDL goal <100 03/13/2007  . Essential hypertension 03/13/2007  . CORONARY ARTERY DISEASE 03/13/2007  . Allergic rhinitis 03/13/2007  . GERD 03/13/2007  . HAIR LOSS  03/13/2007  . LEG PAIN, CHRONIC 03/13/2007  . URINARY INCONTINENCE 03/13/2007  . COLONIC POLYPS, HX OF 03/13/2007  . HX, URINARY INFECTION 08/14/2006   Past Medical History:  Diagnosis Date  . Allergic rhinitis   . CAD (coronary artery disease)   . Cancer ([place])    skin  . Chronic kidney disease    overactive bladder  . Colon polyp   . Diverticulosis    sigmoid and descending colon  . DM (diabetes mellitus) ([place])    no longer take any diabetic meds now that off Prednisone, type 2 diet controlled  . Elevated LFTs over one year ago  . Esophageal ring   . GERD (gastroesophageal reflux disease)   . History of hepatitis 1975   unsure of type  . Hyperlipemia   . Hypertension   . Internal hemorrhoids   . Lichen sclerosus 123456   (DX BY GYN AFTER ABN PAP)  . Pemphigoid, cicatricial    D/O AFFECTING EYES, MOUTH, THROAT NOW (DX [facility])  . Shingles 03/13/2013  . Urinary incontinence    OVERACTIVE BLADDER   Past Surgical History:  Procedure Laterality Date  . ABDOMINAL HYSTERECTOMY    . ANGIOPLASTY  yrs ago   LAD  . CARDIAC ELECTROPHYSIOLOGY MAPPING AND ABLATION    . CATARACT EXTRACTION Bilateral   . COLONOSCOPY    . ESOPHAGOGASTRODUODENOSCOPY  09/14/2011  Procedure: ESOPHAGOGASTRODUODENOSCOPY (EGD);  Surgeon: Gatha Mayer, MD;  Location: [facility];  Service: Endoscopy;  Laterality: N/A;  needs xray  . ESOPHAGOGASTRODUODENOSCOPY N/A 01/02/2013   Procedure: ESOPHAGOGASTRODUODENOSCOPY (EGD);  Surgeon: Gatha Mayer, MD;  Location: [facility];  Service: Endoscopy;  Laterality: N/A;  . ESOPHAGOGASTRODUODENOSCOPY N/A 04/12/2013   Procedure: ESOPHAGOGASTRODUODENOSCOPY (EGD);  Surgeon: Gatha Mayer, MD;  Location: [facility];  Service: Endoscopy;  Laterality: N/A;  . ESOPHAGOGASTRODUODENOSCOPY (EGD) WITH PROPOFOL N/A 05/24/2016   Procedure: ESOPHAGOGASTRODUODENOSCOPY (EGD) WITH PROPOFOL;  Surgeon: Gatha Mayer, MD;  Location: [facility];  Service: Endoscopy;  Laterality: N/A;   . ESOPHAGOGASTRODUODENOSCOPY (EGD) WITH PROPOFOL N/A 08/22/2017   Procedure: ESOPHAGOGASTRODUODENOSCOPY (EGD) WITH PROPOFOL;  Surgeon: Gatha Mayer, MD;  Location: [facility];  Service: Endoscopy;  Laterality: N/A;  . Venia Minks DILATION N/A 01/02/2013   Procedure: Venia Minks DILATION;  Surgeon: Gatha Mayer, MD;  Location: [facility];  Service: Endoscopy;  Laterality: N/A;  . Venia Minks DILATION N/A 05/24/2016   Procedure: Venia Minks DILATION;  Surgeon: Gatha Mayer, MD;  Location: [facility];  Service: Endoscopy;  Laterality: N/A;  . Venia Minks DILATION N/A 08/22/2017   Procedure: Venia Minks DILATION;  Surgeon: Gatha Mayer, MD;  Location: [facility];  Service: Endoscopy;  Laterality: N/A;  . NOSE SURGERY     Biopsy  . OOPHORECTOMY    . SAVORY DILATION  09/14/2011   Procedure: SAVORY DILATION;  Surgeon: Gatha Mayer, MD;  Location: [facility];  Service: Endoscopy;  Laterality: N/A;  . SAVORY DILATION N/A 01/02/2013   Procedure: SAVORY DILATION;  Surgeon: Gatha Mayer, MD;  Location: [facility];  Service: Endoscopy;  Laterality: N/A;  . SAVORY DILATION N/A 04/12/2013   Procedure: SAVORY DILATION;  Surgeon: Gatha Mayer, MD;  Location: [facility];  Service: Endoscopy;  Laterality: N/A;  . skin cancer removal  02/2016   Social History   Tobacco Use  . Smoking status: Former Smoker    Quit date: 02/29/1968    Years since quitting: 50.7  . Smokeless tobacco: Never Used  Substance Use Topics  . Alcohol use: No    Alcohol/week: 0.0 standard drinks  . Drug use: No   Family History  Problem Relation Age of Onset  . Lung cancer Father   . Esophageal cancer Son 36  . Stroke Mother        CVA  . Alzheimer's disease Mother   . Hypertension Brother   . Stomach cancer Son 39  . Breast cancer Maternal Aunt   . Colon cancer Neg Hx   . Rectal cancer Neg Hx    Allergies  Allergen Reactions  . Aspirin Other (See Comments)    Bleeds too easily  . Lipitor [Atorvastatin] Other (See  Comments)    Elevated LFT's  . Ultram [Tramadol] Other (See Comments)    Dizziness  . Adhesive  [Tape] Rash  . Latex Itching and Rash  . Tetanus Toxoid Rash   Current Outpatient Medications on File Prior to Visit  Medication Sig Dispense Refill  . acetaminophen (TYLENOL) 500 MG tablet Take 500 mg by mouth daily as needed for moderate pain or headache.    . brimonidine (ALPHAGAN) 0.2 % ophthalmic solution Place 1 drop into both eyes daily.    . fluticasone (FLONASE) 50 MCG/ACT nasal spray Place 2 sprays into both nostrils daily as needed for allergies. 16 g 11  . lisinopril (ZESTRIL) 10 MG tablet Take 1 tablet by mouth once daily 90 tablet 1  .  omeprazole (PRILOSEC) 40 MG capsule Take 1 capsule (40 mg total) by mouth daily. 90 capsule 3  . oxybutynin (DITROPAN XL) 15 MG 24 hr tablet Take 1 tablet (15 mg total) by mouth daily. 90 tablet 3  . pravastatin (PRAVACHOL) 40 MG tablet Take 1 tablet (40 mg total) by mouth daily. 90 tablet 3  . prednisoLONE acetate (OMNIPRED) 1 % ophthalmic suspension Place 1 drop into both eyes daily.     . sodium chloride (OCEAN) 0.65 % SOLN nasal spray Place 1 spray into both nostrils as needed for congestion.    . timolol (BETIMOL) 0.5 % ophthalmic solution Place 1 drop into both eyes 2 (two) times daily.      No current facility-administered medications on file prior to visit.      Review of Systems  Constitutional: Positive for fatigue. Negative for activity change, appetite change and fever.  HENT: Negative for congestion and sore throat.   Eyes: Negative for itching and visual disturbance.  Respiratory: Negative for cough and shortness of breath.   Cardiovascular: Negative for leg swelling.  Gastrointestinal: Negative for abdominal distention, abdominal pain, constipation, diarrhea and nausea.  Endocrine: Negative for cold intolerance and polydipsia.  Genitourinary: Positive for dysuria, frequency and urgency. Negative for difficulty urinating, flank  pain and hematuria.  Musculoskeletal: Negative for myalgias.  Skin: Negative for rash.  Allergic/Immunologic: Negative for immunocompromised state.  Neurological: Negative for dizziness and weakness.  Hematological: Negative for adenopathy.       Objective:   Physical Exam Constitutional:      General: She is not in acute distress.    Appearance: She is well-developed and normal weight. She is not ill-appearing.  HENT:     Head: Normocephalic and atraumatic.     Comments: Hoarse voice Eyes:     Conjunctiva/sclera: Conjunctivae normal.     Pupils: Pupils are equal, round, and reactive to light.  Neck:     Musculoskeletal: Normal range of motion and neck supple.  Cardiovascular:     Rate and Rhythm: Normal rate and regular rhythm.     Heart sounds: Normal heart sounds.  Pulmonary:     Effort: Pulmonary effort is normal.     Breath sounds: Normal breath sounds.  Abdominal:     General: Bowel sounds are normal. There is no distension.     Palpations: Abdomen is soft.     Tenderness: There is abdominal tenderness. There is no rebound.     Comments: No cva tenderness  Mild suprapubic tenderness  Lymphadenopathy:     Cervical: No cervical adenopathy.  Skin:    General: Skin is warm and dry.     Findings: No rash.  Neurological:     Mental Status: She is alert. Mental status is at baseline.     Gait: Gait normal.  Psychiatric:        Mood and Affect: Mood normal.           Assessment & Plan:   Problem List Items Addressed This Visit      Genitourinary   UTI (urinary tract infection) - Primary    Pos ua  Culture pending  Enc water intake tx with bactrim ds  Update if not starting to improve in a several days or if worsening   Handout given      Relevant Medications   sulfamethoxazole-trimethoprim (BACTRIM DS) 800-160 MG tablet   Other Relevant Orders   Urine Culture    Other Visit Diagnoses    Dysuria  Relevant Orders   POCT Urinalysis Dipstick  (Automated) (Completed)   Need for influenza vaccination       Relevant Orders   Flu Vaccine QUAD High Dose(Fluad) (Completed)

## 2018-11-07 NOTE — Patient Instructions (Addendum)
Take the bactrim DS as directed for uti  Drink lots of water Alert [place] if symptoms worsen We will call you when the urine culture returns (to make sure we do not need to change the medication)  Flu shot today     Urinary Tract Infection, Adult  A urinary tract infection (UTI) is an infection of any part of the urinary tract. The urinary tract includes the kidneys, ureters, bladder, and urethra. These organs make, store, and get rid of urine in the body. Your health care provider may use other names to describe the infection. An upper UTI affects the ureters and kidneys (pyelonephritis). A lower UTI affects the bladder (cystitis) and urethra (urethritis). What are the causes? Most urinary tract infections are caused by bacteria in your genital area, around the entrance to your urinary tract (urethra). These bacteria grow and cause inflammation of your urinary tract. What increases the risk? You are more likely to develop this condition if:  You have a urinary catheter that stays in place (indwelling).  You are not able to control when you urinate or have a bowel movement (you have incontinence).  You are female and you: ? Use a spermicide or diaphragm for birth control. ? Have low estrogen levels. ? Are pregnant.  You have certain genes that increase your risk (genetics).  You are sexually active.  You take antibiotic medicines.  You have a condition that causes your flow of urine to slow down, such as: ? An enlarged prostate, if you are female. ? Blockage in your urethra (stricture). ? A kidney stone. ? A nerve condition that affects your bladder control (neurogenic bladder). ? Not getting enough to drink, or not urinating often.  You have certain medical conditions, such as: ? Diabetes. ? A weak disease-fighting system (immunesystem). ? Sickle cell disease. ? Gout. ? Spinal cord injury. What are the signs or symptoms? Symptoms of this condition include:  Needing to  urinate right away (urgently).  Frequent urination or passing small amounts of urine frequently.  Pain or burning with urination.  Blood in the urine.  Urine that smells bad or unusual.  Trouble urinating.  Cloudy urine.  Vaginal discharge, if you are female.  Pain in the abdomen or the lower back. You may also have:  Vomiting or a decreased appetite.  Confusion.  Irritability or tiredness.  A fever.  Diarrhea. The first symptom in older adults may be confusion. In some cases, they may not have any symptoms until the infection has worsened. How is this diagnosed? This condition is diagnosed based on your medical history and a physical exam. You may also have other tests, including:  Urine tests.  Blood tests.  Tests for sexually transmitted infections (STIs). If you have had more than one UTI, a cystoscopy or imaging studies may be done to determine the cause of the infections. How is this treated? Treatment for this condition includes:  Antibiotic medicine.  Over-the-counter medicines to treat discomfort.  Drinking enough water to stay hydrated. If you have frequent infections or have other conditions such as a kidney stone, you may need to see a health care provider who specializes in the urinary tract (urologist). In rare cases, urinary tract infections can cause sepsis. Sepsis is a life-threatening condition that occurs when the body responds to an infection. Sepsis is treated in the hospital with IV antibiotics, fluids, and other medicines. Follow these instructions at home:  Medicines  Take over-the-counter and prescription medicines only as told by  your health care provider.  If you were prescribed an antibiotic medicine, take it as told by your health care provider. Do not stop using the antibiotic even if you start to feel better. General instructions  Make sure you: ? Empty your bladder often and completely. Do not hold urine for long periods of  time. ? Empty your bladder after sex. ? Wipe from front to back after a bowel movement if you are female. Use each tissue one time when you wipe.  Drink enough fluid to keep your urine pale yellow.  Keep all follow-up visits as told by your health care provider. This is important. Contact a health care provider if:  Your symptoms do not get better after 1-2 days.  Your symptoms go away and then return. Get help right away if you have:  Severe pain in your back or your lower abdomen.  A fever.  Nausea or vomiting. Summary  A urinary tract infection (UTI) is an infection of any part of the urinary tract, which includes the kidneys, ureters, bladder, and urethra.  Most urinary tract infections are caused by bacteria in your genital area, around the entrance to your urinary tract (urethra).  Treatment for this condition often includes antibiotic medicines.  If you were prescribed an antibiotic medicine, take it as told by your health care provider. Do not stop using the antibiotic even if you start to feel better.  Keep all follow-up visits as told by your health care provider. This is important. This information is not intended to replace advice given to you by your health care provider. Make sure you discuss any questions you have with your health care provider. Document Released: 11/24/2004 Document Revised: 02/01/2018 Document Reviewed: 08/24/2017 Elsevier Patient Education  2020 [employer].

## 2018-11-07 NOTE — Assessment & Plan Note (Signed)
Pos ua  Culture pending  Enc water intake tx with bactrim ds  Update if not starting to improve in a several days or if worsening   Handout given

## 2018-11-09 LAB — URINE CULTURE
MICRO NUMBER:: 861252
SPECIMEN QUALITY:: ADEQUATE

## 2018-11-12 ENCOUNTER — Other Ambulatory Visit: Payer: Self-pay

## 2018-11-12 ENCOUNTER — Telehealth: Payer: Self-pay | Admitting: Family Medicine

## 2018-11-12 ENCOUNTER — Other Ambulatory Visit: Payer: Medicare Other

## 2018-11-12 DIAGNOSIS — R300 Dysuria: Secondary | ICD-10-CM

## 2018-11-12 DIAGNOSIS — Z87440 Personal history of urinary (tract) infections: Secondary | ICD-10-CM | POA: Diagnosis not present

## 2018-11-12 MED ORDER — CEPHALEXIN 500 MG PO CAPS: 500.0000 mg | ORAL_CAPSULE | Freq: Three times a day (TID) | ORAL | 0 refills | Status: DC

## 2018-11-12 NOTE — Telephone Encounter (Signed)
Pt came to office and dropped of urine sample, Rx sent to pharmacy and pt aware

## 2018-11-12 NOTE — Telephone Encounter (Signed)
Left VM requesting pt to call the office back 

## 2018-11-12 NOTE — Telephone Encounter (Signed)
Patient called.  Patient was told to call back if her uti symptoms didn't improve.  Patient said she has pain when urinating,can't control it, urgency, and pressure.  Patient uses [facility].  Please call patient back.

## 2018-11-12 NOTE — Telephone Encounter (Signed)
Patient returned [place]'s call.  Patient will come by to give sample. Patient said she's been drinking a lot of water.

## 2018-11-12 NOTE — Telephone Encounter (Signed)
Her ucx showed e coli that should have been sensitive  Please drop off urine sample for culture only  Then send in keflex 500 mg 1 po tid for 5 d #15 no refills  Enc water intake

## 2018-11-14 LAB — URINE CULTURE
MICRO NUMBER:: 877625
Result:: NO GROWTH
SPECIMEN QUALITY:: ADEQUATE

## 2018-11-16 DIAGNOSIS — R490 Dysphonia: Secondary | ICD-10-CM | POA: Diagnosis not present

## 2018-11-16 DIAGNOSIS — R1310 Dysphagia, unspecified: Secondary | ICD-10-CM | POA: Diagnosis not present

## 2018-11-20 ENCOUNTER — Telehealth: Payer: Self-pay | Admitting: Family Medicine

## 2018-11-20 DIAGNOSIS — R1319 Other dysphagia: Secondary | ICD-10-CM

## 2018-11-20 DIAGNOSIS — R32 Unspecified urinary incontinence: Secondary | ICD-10-CM

## 2018-11-20 DIAGNOSIS — R490 Dysphonia: Secondary | ICD-10-CM

## 2018-11-20 DIAGNOSIS — R1310 Dysphagia, unspecified: Secondary | ICD-10-CM

## 2018-11-20 NOTE — Telephone Encounter (Signed)
Both referrals done Will route to [facility] 

## 2018-11-20 NOTE — Telephone Encounter (Signed)
Patient stated that she went to see the ENT doctor and they advised her that she will need to go see a Urologist . She would like to see if you could place a referral for her to be seen in a Urologist office. Patient would prefer an office in [place] if possible due to her being home alone now and It is closer for her.

## 2018-11-20 NOTE — Telephone Encounter (Signed)
I just got her notes Turns out it is not urology or neurology but most likely GI he was advising   Mentioned she had swallowing problems and may need esophageal dilatation  In that case she would need f/u with GI Dr Carlean Purl Does she want [place] to do that referral ? (is that correct? ) Let me know -thanks

## 2018-11-20 NOTE — Telephone Encounter (Signed)
Urologist (urinary system) or neurologist (nervous system) doctor?    I think she may mean neurologist Please send for the ENT note so I can review and refer (I need to review it first) Thanks  Pleas send this back to me

## 2018-11-20 NOTE — Telephone Encounter (Signed)
Spoke with patient.  She says she would greatly appreciate a GI referral but would also like a referral to urology.  She says that she cannot control her bladder when she stands up and she would like to see a urologist for this.

## 2018-11-21 ENCOUNTER — Encounter: Payer: Self-pay | Admitting: Internal Medicine

## 2018-11-30 ENCOUNTER — Ambulatory Visit: Payer: Medicare Other | Admitting: Gastroenterology

## 2018-12-17 ENCOUNTER — Other Ambulatory Visit: Payer: Self-pay

## 2018-12-17 ENCOUNTER — Ambulatory Visit (INDEPENDENT_AMBULATORY_CARE_PROVIDER_SITE_OTHER): Payer: Medicare Other | Admitting: Urology

## 2018-12-17 ENCOUNTER — Encounter: Payer: Self-pay | Admitting: Urology

## 2018-12-17 VITALS — BP 146/79 | HR 76 | Ht 60.0 in | Wt 113.0 lb

## 2018-12-17 DIAGNOSIS — R32 Unspecified urinary incontinence: Secondary | ICD-10-CM

## 2018-12-17 DIAGNOSIS — N3946 Mixed incontinence: Secondary | ICD-10-CM

## 2018-12-17 MED ORDER — NITROFURANTOIN MONOHYD MACRO 100 MG PO CAPS: 100.0000 mg | ORAL_CAPSULE | Freq: Two times a day (BID) | ORAL | 0 refills | Status: DC

## 2018-12-17 NOTE — Progress Notes (Signed)
12/17/2018 11:23 AM   Victoria Allison 1941-01-08 [id number]  Referring provider: Abner Greenspan, MD [place],  [place] [address]  No chief complaint on file.   HPI: Dr Jacqlyn Larsen: Patient has had chronic cystitis and has been on oxybutynin and Vesicare in the past The patient in the last month has vaginal discomfort when she voids but is relieved by voiding but not on every occasion.  Some of the details of the history were difficult.  Last week she had low back pain radiating to her left leg and the settled down.  She does not have cloudy or foul-smelling urine but I believe she has got worsening bedwetting and worsening frequency associated  It was more challenging to baseline her symptoms.  She was having urge incontinence wearing 2 pads during the day and 1 small pad at night.  She said the leakage was mild.  She is on oxybutynin ER 15 mg  Modifying factors: There are no other modifying factors  Associated signs and symptoms: There are no other associated signs and symptoms Aggravating and relieving factors: There are no other aggravating or relieving factors Severity: Moderate Duration: Persistent   PMH: Past Medical History:  Diagnosis Date  . Allergic rhinitis   . CAD (coronary artery disease)   . Cancer ([facility])    skin  . Chronic kidney disease    overactive bladder  . Colon polyp   . Diverticulosis    sigmoid and descending colon  . DM (diabetes mellitus) ([place])    no longer take any diabetic meds now that off Prednisone, type 2 diet controlled  . Elevated LFTs over one year ago  . Esophageal ring   . GERD (gastroesophageal reflux disease)   . History of hepatitis 1975   unsure of type  . Hyperlipemia   . Hypertension   . Internal hemorrhoids   . Lichen sclerosus [id number]   (DX BY GYN AFTER ABN PAP)  . Pemphigoid, cicatricial    D/O AFFECTING EYES, MOUTH, THROAT NOW (DX [facility])  . Shingles 03/13/2013  . Urinary incontinence    OVERACTIVE BLADDER     Surgical History: Past Surgical History:  Procedure Laterality Date  . ABDOMINAL HYSTERECTOMY    . ANGIOPLASTY  yrs ago   LAD  . CARDIAC ELECTROPHYSIOLOGY MAPPING AND ABLATION    . CATARACT EXTRACTION Bilateral   . COLONOSCOPY    . ESOPHAGOGASTRODUODENOSCOPY  09/14/2011   Procedure: ESOPHAGOGASTRODUODENOSCOPY (EGD);  Surgeon: Gatha Mayer, MD;  Location: [facility];  Service: Endoscopy;  Laterality: N/A;  needs xray  . ESOPHAGOGASTRODUODENOSCOPY N/A 01/02/2013   Procedure: ESOPHAGOGASTRODUODENOSCOPY (EGD);  Surgeon: Gatha Mayer, MD;  Location: [facility];  Service: Endoscopy;  Laterality: N/A;  . ESOPHAGOGASTRODUODENOSCOPY N/A 04/12/2013   Procedure: ESOPHAGOGASTRODUODENOSCOPY (EGD);  Surgeon: Gatha Mayer, MD;  Location: [facility];  Service: Endoscopy;  Laterality: N/A;  . ESOPHAGOGASTRODUODENOSCOPY (EGD) WITH PROPOFOL N/A 05/24/2016   Procedure: ESOPHAGOGASTRODUODENOSCOPY (EGD) WITH PROPOFOL;  Surgeon: Gatha Mayer, MD;  Location: [facility];  Service: Endoscopy;  Laterality: N/A;  . ESOPHAGOGASTRODUODENOSCOPY (EGD) WITH PROPOFOL N/A 08/22/2017   Procedure: ESOPHAGOGASTRODUODENOSCOPY (EGD) WITH PROPOFOL;  Surgeon: Gatha Mayer, MD;  Location: [facility];  Service: Endoscopy;  Laterality: N/A;  . Venia Minks DILATION N/A 01/02/2013   Procedure: Venia Minks DILATION;  Surgeon: Gatha Mayer, MD;  Location: [facility];  Service: Endoscopy;  Laterality: N/A;  . Venia Minks DILATION N/A 05/24/2016   Procedure: Venia Minks DILATION;  Surgeon: Gatha Mayer, MD;  Location: [facility];  Service: Endoscopy;  Laterality: N/A;  . Venia Minks DILATION N/A 08/22/2017   Procedure: Venia Minks DILATION;  Surgeon: Gatha Mayer, MD;  Location: [facility];  Service: Endoscopy;  Laterality: N/A;  . NOSE SURGERY     Biopsy  . OOPHORECTOMY    . SAVORY DILATION  09/14/2011   Procedure: SAVORY DILATION;  Surgeon: Gatha Mayer, MD;  Location: [facility];  Service: Endoscopy;  Laterality: N/A;  .  SAVORY DILATION N/A 01/02/2013   Procedure: SAVORY DILATION;  Surgeon: Gatha Mayer, MD;  Location: [facility];  Service: Endoscopy;  Laterality: N/A;  . SAVORY DILATION N/A 04/12/2013   Procedure: SAVORY DILATION;  Surgeon: Gatha Mayer, MD;  Location: [facility];  Service: Endoscopy;  Laterality: N/A;  . skin cancer removal  02/2016    Home Medications:  Allergies as of 12/17/2018      Reactions   Aspirin Other (See Comments)   Bleeds too easily   Lipitor [atorvastatin] Other (See Comments)   Elevated LFT's   Ultram [tramadol] Other (See Comments)   Dizziness   Adhesive  [tape] Rash   Latex Itching, Rash   Tetanus Toxoid Rash      Medication List       Accurate as of December 17, 2018 11:23 AM. If you have any questions, ask your nurse or doctor.        STOP taking these medications   cephALEXin 500 MG capsule Commonly known as: KEFLEX Stopped by: Reece Packer, MD   sulfamethoxazole-trimethoprim 800-160 MG tablet Commonly known as: BACTRIM DS Stopped by: Reece Packer, MD     TAKE these medications   acetaminophen 500 MG tablet Commonly known as: TYLENOL Take 500 mg by mouth daily as needed for moderate pain or headache.   brimonidine 0.2 % ophthalmic solution Commonly known as: ALPHAGAN Place 1 drop into both eyes daily.   fluticasone 50 MCG/ACT nasal spray Commonly known as: FLONASE Place 2 sprays into both nostrils daily as needed for allergies.   lisinopril 10 MG tablet Commonly known as: ZESTRIL Take 1 tablet by mouth once daily   omeprazole 40 MG capsule Commonly known as: PRILOSEC Take 1 capsule (40 mg total) by mouth daily.   Omnipred 1 % ophthalmic suspension Generic drug: prednisoLONE acetate Place 1 drop into both eyes daily.   oxybutynin 15 MG 24 hr tablet Commonly known as: DITROPAN XL Take 1 tablet (15 mg total) by mouth daily.   pravastatin 40 MG tablet Commonly known as: PRAVACHOL Take 1 tablet (40 mg total) by  mouth daily.   sodium chloride 0.65 % Soln nasal spray Commonly known as: OCEAN Place 1 spray into both nostrils as needed for congestion.   timolol 0.5 % ophthalmic solution Commonly known as: BETIMOL Place 1 drop into both eyes 2 (two) times daily.       Allergies:  Allergies  Allergen Reactions  . Aspirin Other (See Comments)    Bleeds too easily  . Lipitor [Atorvastatin] Other (See Comments)    Elevated LFT's  . Ultram [Tramadol] Other (See Comments)    Dizziness  . Adhesive  [Tape] Rash  . Latex Itching and Rash  . Tetanus Toxoid Rash    Family History: Family History  Problem Relation Age of Onset  . Lung cancer Father   . Esophageal cancer Son 28  . Stroke Mother        CVA  . Alzheimer's disease Mother   . Hypertension Brother   .  Stomach cancer Son 46  . Breast cancer Maternal Aunt   . Colon cancer Neg Hx   . Rectal cancer Neg Hx     Social History:  reports that she quit smoking about 50 years ago. She has never used smokeless tobacco. She reports that she does not drink alcohol or use drugs.  ROS: UROLOGY Frequent Urination?: Yes Hard to postpone urination?: Yes Burning/pain with urination?: Yes Get up at night to urinate?: Yes Leakage of urine?: Yes Urine stream starts and stops?: No Trouble starting stream?: No Do you have to strain to urinate?: No Blood in urine?: No Urinary tract infection?: No Sexually transmitted disease?: No Injury to kidneys or bladder?: No Painful intercourse?: No Weak stream?: No Currently pregnant?: No Vaginal bleeding?: No Last menstrual period?: N  Gastrointestinal Nausea?: No Vomiting?: No Indigestion/heartburn?: No Diarrhea?: No Constipation?: No  Constitutional Fever: No Night sweats?: No Weight loss?: No Fatigue?: No  Skin Skin rash/lesions?: No Itching?: No  Eyes Blurred vision?: No Double vision?: No  Ears/Nose/Throat Sore throat?: No Sinus problems?: No  Hematologic/Lymphatic  Swollen glands?: No Easy bruising?: No  Cardiovascular Leg swelling?: No Chest pain?: No  Respiratory Cough?: No Shortness of breath?: No  Endocrine Excessive thirst?: No  Musculoskeletal Back pain?: No Joint pain?: No  Neurological Headaches?: No Dizziness?: No  Psychologic Depression?: No Anxiety?: No  Physical Exam: BP (!) 146/79   Pulse 76   Ht 5' (1.524 m)   Wt 113 lb (51.3 kg)   LMP 02/29/1968   BMI 22.07 kg/m   Constitutional:  Alert and oriented, No acute distress. HEENT:  AT, moist mucus membranes.  Trachea midline, no masses. Cardiovascular: No clubbing, cyanosis, or edema. Respiratory: Normal respiratory effort, no increased work of breathing. 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  Patient may have a small cystocele with difficult to see within the vagina.  Some atrophic changes Skin: No rashes, bruises or suspicious lesions. Lymph: No cervical or inguinal adenopathy. Neurologic: Grossly intact, no focal deficits, moving all 4 extremities. Psychiatric: Normal mood and affect.  Laboratory Data: Lab Results  Component Value Date   WBC 4.5 08/28/2018   HGB 11.5 (L) 08/28/2018   HCT 34.9 (L) 08/28/2018   MCV 84.2 08/28/2018   PLT 193.0 08/28/2018    Lab Results  Component Value Date   CREATININE 0.77 08/28/2018    No results found for: PSA  No results found for: TESTOSTERONE  Lab Results  Component Value Date   HGBA1C 6.0 08/28/2018    Urinalysis    Component Value Date/Time   COLORURINE YELLOW 08/26/2009 0833   APPEARANCEUR CLEAR 08/26/2009 0833   LABSPEC 1.010 08/26/2009 0833   PHURINE 5.5 08/26/2009 0833   GLUCOSEU NEGATIVE 08/26/2009 0833   HGBUR NEGATIVE 08/26/2009 0833   HGBUR trace-intact 09/21/2007 1119   BILIRUBINUR Negative 11/07/2018 1521   KETONESUR NEGATIVE 08/26/2009 0833   PROTEINUR Negative 11/07/2018 1521   PROTEINUR NEGATIVE  08/26/2009 0833   UROBILINOGEN 0.2 11/07/2018 1521   UROBILINOGEN 0.2 08/26/2009 0833   NITRITE Positive 11/07/2018 1521   NITRITE NEGATIVE 08/26/2009 0833   LEUKOCYTESUR Large (3+) (A) 11/07/2018 1521    Pertinent Imaging:   Assessment & Plan: Patient appears to have chronic urinary incontinence with urge incontinence and milder bedwetting.  She appears to do well on oxybutynin probably has a bladder infection.  Urine sent for culture.  I will call in Macrodantin 100 mg twice a day  for 1 week and have the patient come back in 3 weeks for cystoscopy for safety reasons.  If culture positive I may consider suppressive therapy but probably not at this stage  She does not need cystoscopy if this is a bladder infection that improves symptoms by treatment.  Vaginal dryness is another potential target but may be challenging with a narrow introitus  1. Urinary incontinence, unspecified type  - Urinalysis, Complete   No follow-ups on file.  Reece Packer, MD  [facility] [address] [place],  [address] [phone]

## 2018-12-17 NOTE — Patient Instructions (Signed)
Cystoscopy Cystoscopy is a procedure that is used to help diagnose and sometimes treat conditions that affect the lower urinary tract. The lower urinary tract includes the bladder and the urethra. The urethra is the tube that drains urine from the bladder. Cystoscopy is done using a thin, tube-shaped instrument with a light and camera at the end (cystoscope). The cystoscope may be hard or flexible, depending on the goal of the procedure. The cystoscope is inserted through the urethra, into the bladder. Cystoscopy may be recommended if you have:  Urinary tract infections that keep coming back.  Blood in the urine (hematuria).  An inability to control when you urinate (urinary incontinence) or an overactive bladder.  Unusual cells found in a urine sample.  A blockage in the urethra, such as a urinary stone.  Painful urination.  An abnormality in the bladder found during an intravenous pyelogram (IVP) or CT scan. Cystoscopy may also be done to remove a sample of tissue to be examined under a microscope (biopsy). Tell a health care provider about:  Any allergies you have.  All medicines you are taking, including vitamins, herbs, eye drops, creams, and over-the-counter medicines.  Any problems you or family members have had with anesthetic medicines.  Any blood disorders you have.  Any surgeries you have had.  Any medical conditions you have.  Whether you are pregnant or may be pregnant. What are the risks? Generally, this is a safe procedure. However, problems may occur, including:  Infection.  Bleeding.  Allergic reactions to medicines.  Damage to other structures or organs. What happens before the procedure?  Ask your health care provider about: ? Changing or stopping your regular medicines. This is especially important if you are taking diabetes medicines or blood thinners. ? Taking medicines such as aspirin and ibuprofen. These medicines can thin your blood. Do not take  these medicines unless your health care provider tells you to take them. ? Taking over-the-counter medicines, vitamins, herbs, and supplements.  Follow instructions from your health care provider about eating or drinking restrictions.  Ask your health care provider what steps will be taken to help prevent infection. These may include: ? Washing skin with a germ-killing soap. ? Taking antibiotic medicine.  You may have an exam or testing, such as: ? X-rays of the bladder, urethra, or kidneys. ? Urine tests to check for signs of infection.  Plan to have someone take you home from the hospital or clinic. What happens during the procedure?   You will be given one or more of the following: ? A medicine to help you relax (sedative). ? A medicine to numb the area (local anesthetic).  The area around the opening of your urethra will be cleaned.  The cystoscope will be passed through your urethra into your bladder.  Germ-free (sterile) fluid will flow through the cystoscope to fill your bladder. The fluid will stretch your bladder so that your health care provider can clearly examine your bladder walls.  Your doctor will look at the urethra and bladder. Your doctor may take a biopsy or remove stones.  The cystoscope will be removed, and your bladder will be emptied. The procedure may vary among health care providers and hospitals. What can I expect after the procedure? After the procedure, it is common to have:  Some soreness or pain in your abdomen and urethra.  Urinary symptoms. These include: ? Mild pain or burning when you urinate. Pain should stop within a few minutes after you urinate. This   may last for up to 1 week. ? A small amount of blood in your urine for several days. ? Feeling like you need to urinate but producing only a small amount of urine. Follow these instructions at home: Medicines  Take over-the-counter and prescription medicines only as told by your health care  provider.  If you were prescribed an antibiotic medicine, take it as told by your health care provider. Do not stop taking the antibiotic even if you start to feel better. General instructions  Return to your normal activities as told by your health care provider. Ask your health care provider what activities are safe for you.  Do not drive for 24 hours if you were given a sedative during your procedure.  Watch for any blood in your urine. If the amount of blood in your urine increases, call your health care provider.  Follow instructions from your health care provider about eating or drinking restrictions.  If a tissue sample was removed for testing (biopsy) during your procedure, it is up to you to get your test results. Ask your health care provider, or the department that is doing the test, when your results will be ready.  Drink enough fluid to keep your urine pale yellow.  Keep all follow-up visits as told by your health care provider. This is important. Contact a health care provider if you:  Have pain that gets worse or does not get better with medicine, especially pain when you urinate.  Have trouble urinating.  Have more blood in your urine. Get help right away if you:  Have blood clots in your urine.  Have abdominal pain.  Have a fever or chills.  Are unable to urinate. Summary  Cystoscopy is a procedure that is used to help diagnose and sometimes treat conditions that affect the lower urinary tract.  Cystoscopy is done using a thin, tube-shaped instrument with a light and camera at the end.  After the procedure, it is common to have some soreness or pain in your abdomen and urethra.  Watch for any blood in your urine. If the amount of blood in your urine increases, call your health care provider.  If you were prescribed an antibiotic medicine, take it as told by your health care provider. Do not stop taking the antibiotic even if you start to feel better. This  information is not intended to replace advice given to you by your health care provider. Make sure you discuss any questions you have with your health care provider. Document Released: 02/12/2000 Document Revised: 02/06/2018 Document Reviewed: 02/06/2018 [employer] Patient Education  2020 [employer]  

## 2018-12-18 LAB — URINALYSIS, COMPLETE
Bilirubin, UA: NEGATIVE
Glucose, UA: NEGATIVE
Ketones, UA: NEGATIVE
Nitrite, UA: NEGATIVE
Protein,UA: NEGATIVE
Specific Gravity, UA: 1.015 (ref 1.005–1.030)
Urobilinogen, Ur: 0.2 mg/dL (ref 0.2–1.0)
pH, UA: 7 (ref 5.0–7.5)

## 2018-12-18 LAB — MICROSCOPIC EXAMINATION: WBC, UA: >30 /hpf — AB (ref 0–5)

## 2018-12-19 LAB — CULTURE, URINE COMPREHENSIVE

## 2018-12-31 DIAGNOSIS — L121 Cicatricial pemphigoid: Secondary | ICD-10-CM | POA: Diagnosis not present

## 2019-01-07 ENCOUNTER — Ambulatory Visit (INDEPENDENT_AMBULATORY_CARE_PROVIDER_SITE_OTHER): Payer: Medicare Other | Admitting: Urology

## 2019-01-07 ENCOUNTER — Other Ambulatory Visit: Payer: Self-pay

## 2019-01-07 VITALS — BP 164/76 | HR 79 | Ht 60.0 in | Wt 112.0 lb

## 2019-01-07 DIAGNOSIS — N3946 Mixed incontinence: Secondary | ICD-10-CM | POA: Diagnosis not present

## 2019-01-07 DIAGNOSIS — R32 Unspecified urinary incontinence: Secondary | ICD-10-CM

## 2019-01-07 LAB — URINALYSIS, COMPLETE
Bilirubin, UA: NEGATIVE
Glucose, UA: NEGATIVE
Ketones, UA: NEGATIVE
Nitrite, UA: NEGATIVE
Protein,UA: NEGATIVE
RBC, UA: NEGATIVE
Specific Gravity, UA: 1.02 (ref 1.005–1.030)
Urobilinogen, Ur: 1 mg/dL (ref 0.2–1.0)
pH, UA: 7 (ref 5.0–7.5)

## 2019-01-07 LAB — MICROSCOPIC EXAMINATION
Bacteria, UA: NONE SEEN
RBC, Urine: NONE SEEN /hpf (ref 0–2)

## 2019-01-07 MED ORDER — TRIMETHOPRIM 100 MG PO TABS: 100.0000 mg | ORAL_TABLET | Freq: Every day | ORAL | 11 refills | Status: DC

## 2019-01-07 NOTE — Progress Notes (Signed)
01/07/2019 1:23 PM   Tamela Oddi Revonda Humphrey 03/06/1940 [id number]  Referring provider: Abner Greenspan, MD [address] [place],  [place] [address]  Chief Complaint  Patient presents with  . Cysto    HPI: Dr Jacqlyn Larsen: Patient has had chronic cystitis and has been on oxybutynin and Vesicare   The patient in the last month has vaginal discomfort when she voids but is relieved by voiding but not on every occasion.  Some of the details of the history were difficult.  Last week she had low back pain radiating to her left leg and the settled down.  She does not have cloudy or foul-smelling urine but I believe she has got worsening bedwetting and worsening frequency associated  It was more challenging to baseline her symptoms.  She was having urge incontinence wearing 2 pads during the day and 1 small pad at night.  She said the leakage was mild.  She is on oxybutynin ER 15 mg   Very narrow introitus from labia minora synechiae.  Patient may have a small cystocele with difficult to see within the vagina.  Some atrophic changes  Patient appears to have chronic urinary incontinence with urge incontinence and milder bedwetting.  She appears to do well on oxybutynin probably has a bladder infection.  Urine sent for culture.  I will call in Macrodantin 100 mg twice a day for 1 week and have the patient come back in 3 weeks for cystoscopy for safety reasons.  If culture positive I may consider suppressive therapy but probably not at this stage  She does not need cystoscopy if this is a bladder infection that improves symptoms by treatment.  Vaginal dryness is another potential target but may be challenging with a narrow introitus  Today Frequency stable.  Last urine culture positive.  The patient is having less discomfort but again she is nonspecific.  No more back pain since the Macrodantin.  I think she still has some urge incontinence especially in the middle of the night  She had a very  narrow introitus again but I was able to negotiate the cystoscope  Cystoscopy: On cystoscopy initially I thought the bladder mucosa was completely normal.  It was a little bit erythematous along the right lateral wall and there appeared to be a few millimeters of elevation or lesions that may represent mild papillary superficial carcinoma.  She smoked in the 1970s.  She also had significant pseudomembranous trigonitis next to it.  I tried to use the flow to see if it would move the initial tensional papillary lesions but they did not   PMH: Past Medical History:  Diagnosis Date  . Allergic rhinitis   . CAD (coronary artery disease)   . Cancer ([place])    skin  . Chronic kidney disease    overactive bladder  . Colon polyp   . Diverticulosis    sigmoid and descending colon  . DM (diabetes mellitus) ([place])    no longer take any diabetic meds now that off Prednisone, type 2 diet controlled  . Elevated LFTs over one year ago  . Esophageal ring   . GERD (gastroesophageal reflux disease)   . History of hepatitis 1975   unsure of type  . Hyperlipemia   . Hypertension   . Internal hemorrhoids   . Lichen sclerosus [id number]   (DX BY GYN AFTER ABN PAP)  . Pemphigoid, cicatricial    D/O AFFECTING EYES, MOUTH, THROAT NOW (DX [facility])  . Shingles 03/13/2013  .  Urinary incontinence    OVERACTIVE BLADDER    Surgical History: Past Surgical History:  Procedure Laterality Date  . ABDOMINAL HYSTERECTOMY    . ANGIOPLASTY  yrs ago   LAD  . CARDIAC ELECTROPHYSIOLOGY MAPPING AND ABLATION    . CATARACT EXTRACTION Bilateral   . COLONOSCOPY    . ESOPHAGOGASTRODUODENOSCOPY  09/14/2011   Procedure: ESOPHAGOGASTRODUODENOSCOPY (EGD);  Surgeon: Gatha Mayer, MD;  Location: [facility];  Service: Endoscopy;  Laterality: N/A;  needs xray  . ESOPHAGOGASTRODUODENOSCOPY N/A 01/02/2013   Procedure: ESOPHAGOGASTRODUODENOSCOPY (EGD);  Surgeon: Gatha Mayer, MD;  Location: [facility];  Service: Endoscopy;   Laterality: N/A;  . ESOPHAGOGASTRODUODENOSCOPY N/A 04/12/2013   Procedure: ESOPHAGOGASTRODUODENOSCOPY (EGD);  Surgeon: Gatha Mayer, MD;  Location: [facility];  Service: Endoscopy;  Laterality: N/A;  . ESOPHAGOGASTRODUODENOSCOPY (EGD) WITH PROPOFOL N/A 05/24/2016   Procedure: ESOPHAGOGASTRODUODENOSCOPY (EGD) WITH PROPOFOL;  Surgeon: Gatha Mayer, MD;  [facility]: [facility];  Service: Endoscopy;  Laterality: N/A;  . ESOPHAGOGASTRODUODENOSCOPY (EGD) WITH PROPOFOL N/A 08/22/2017   Procedure: ESOPHAGOGASTRODUODENOSCOPY (EGD) WITH PROPOFOL;  Surgeon: Gatha Mayer, MD;  [facility];  Service: Endoscopy;  Laterality: N/A;  . Venia Minks DILATION N/A 01/02/2013   Procedure: Venia Minks DILATION;  Surgeon: Gatha Mayer, MD;  [facility];  Service: Endoscopy;  Laterality: N/A;  . Venia Minks DILATION N/A 05/24/2016   Procedure: Venia Minks DILATION;  Surgeon: Gatha Mayer, MD;  [facility];  Service: Endoscopy;  Laterality: N/A;  . Venia Minks DILATION N/A 08/22/2017   Procedure: Venia Minks DILATION;  Surgeon: Gatha Mayer, MD;  [facility];  Service: Endoscopy;  Laterality: N/A;  . NOSE SURGERY     Biopsy  . OOPHORECTOMY    . SAVORY DILATION  09/14/2011   Procedure: SAVORY DILATION;  Surgeon: Gatha Mayer, MD;  [facility];  Service: Endoscopy;  Laterality: N/A;  . SAVORY DILATION N/A 01/02/2013   Procedure: SAVORY DILATION;  Surgeon: Gatha Mayer, MD;  [facility];  Service: Endoscopy;  Laterality: N/A;  . SAVORY DILATION N/A 04/12/2013   Procedure: SAVORY DILATION;  Surgeon: Gatha Mayer, MD;  [facility];  Service: Endoscopy;  Laterality: N/A;  . skin cancer removal  02/2016    Home Medications:  Allergies as of 01/07/2019      Reactions   Aspirin Other (See Comments)   Bleeds too easily   Lipitor [atorvastatin] Other (See Comments)   Elevated LFT's   Ultram [tramadol] Other (See Comments)   Dizziness   Adhesive  [tape]  Rash   Latex Itching, Rash   Tetanus Toxoid Rash      Medication List       Accurate as of January 07, 2019  1:23 PM. If you have any questions, ask your nurse or doctor.        acetaminophen 500 MG tablet Commonly known as: TYLENOL Take 500 mg by mouth daily as needed for moderate pain or headache.   brimonidine 0.2 % ophthalmic solution Commonly known as: ALPHAGAN Place 1 drop into both eyes daily.   fluticasone 50 MCG/ACT nasal spray Commonly known as: FLONASE Place 2 sprays into both nostrils daily as needed for allergies.   lisinopril 10 MG tablet Commonly known as: ZESTRIL Take 1 tablet by mouth once daily   nitrofurantoin (macrocrystal-monohydrate) 100 MG capsule Commonly known as: Macrobid Take 1 capsule (100 mg total) by mouth 2 (two) times daily.   omeprazole 40 MG capsule Commonly known as: PRILOSEC Take 1 capsule (40  mg total) by mouth daily.   Omnipred 1 % ophthalmic suspension Generic drug: prednisoLONE acetate Place 1 drop into both eyes daily.   oxybutynin 15 MG 24 hr tablet Commonly known as: DITROPAN XL Take 1 tablet (15 mg total) by mouth daily.   pravastatin 40 MG tablet Commonly known as: PRAVACHOL Take 1 tablet (40 mg total) by mouth daily.   sodium chloride 0.65 % Soln nasal spray Commonly known as: OCEAN Place 1 spray into both nostrils as needed for congestion.   timolol 0.5 % ophthalmic solution Commonly known as: BETIMOL Place 1 drop into both eyes 2 (two) times daily.       Allergies:  Allergies  Allergen Reactions  . Aspirin Other (See Comments)    Bleeds too easily  . Lipitor [Atorvastatin] Other (See Comments)    Elevated LFT's  . Ultram [Tramadol] Other (See Comments)    Dizziness  . Adhesive  [Tape] Rash  . Latex Itching and Rash  . Tetanus Toxoid Rash    Family History: Family History  Problem Relation Age of Onset  . Lung cancer Father   . Esophageal cancer Son 95  . Stroke Mother        CVA  .  Alzheimer's disease Mother   . Hypertension Brother   . Stomach cancer Son 39  . Breast cancer Maternal Aunt   . Colon cancer Neg Hx   . Rectal cancer Neg Hx     Social History:  reports that she quit smoking about 50 years ago. She has never used smokeless tobacco. She reports that she does not drink alcohol or use drugs.  ROS:                                        Physical Exam: BP (!) 164/76   Pulse 79   Ht 5' (1.524 m)   Wt 112 lb (50.8 kg)   LMP 02/29/1968   BMI 21.87 kg/m   Constitutional:  Alert and oriented, No acute distress.   Laboratory Data: Lab Results  Component Value Date   WBC 4.5 08/28/2018   HGB 11.5 (L) 08/28/2018   HCT 34.9 (L) 08/28/2018   MCV 84.2 08/28/2018   PLT 193.0 08/28/2018    Lab Results  Component Value Date   CREATININE 0.77 08/28/2018    No results found for: PSA  No results found for: TESTOSTERONE  Lab Results  Component Value Date   HGBA1C 6.0 08/28/2018    Urinalysis    Component Value Date/Time   COLORURINE YELLOW 08/26/2009 0833   APPEARANCEUR Cloudy (A) 12/17/2018 1048   LABSPEC 1.010 08/26/2009 0833   PHURINE 5.5 08/26/2009 0833   GLUCOSEU Negative 12/17/2018 1048   HGBUR NEGATIVE 08/26/2009 0833   HGBUR trace-intact 09/21/2007 1119   BILIRUBINUR Negative 12/17/2018 1048   KETONESUR NEGATIVE 08/26/2009 0833   PROTEINUR Negative 12/17/2018 1048   PROTEINUR NEGATIVE 08/26/2009 0833   UROBILINOGEN 0.2 11/07/2018 1521   UROBILINOGEN 0.2 08/26/2009 0833   NITRITE Negative 12/17/2018 1048   NITRITE NEGATIVE 08/26/2009 0833   LEUKOCYTESUR 2+ (A) 12/17/2018 1048    Pertinent Imaging:   Assessment & Plan: The findings were mildly abnormal.  I want to put her on trimethoprim suppression therapy to see if this prevents bladder infections and even down regulate some of her urgency incontinence.  For now I will keep her on the oxybutynin but other  treatments can be offered.  Like to have one of  my partners recystoscoped her on prophylaxis in 2 months.  It will be interesting to see if the lesions are still present and if they were a false positive from recent positive urine culture.  She is 78 years of age and may or may not benefit from a bladder biopsy but I would like a second opinion regarding  1. Urinary incontinence, unspecified type  - Urinalysis, Complete   No follow-ups on file.  Reece Packer, MD  [place] [address] [place], [place] [address] [phone]

## 2019-01-08 ENCOUNTER — Other Ambulatory Visit: Payer: Medicare Other | Admitting: Urology

## 2019-01-09 LAB — CULTURE, URINE COMPREHENSIVE

## 2019-01-10 ENCOUNTER — Telehealth: Payer: Self-pay | Admitting: *Deleted

## 2019-01-10 ENCOUNTER — Other Ambulatory Visit: Payer: Self-pay | Admitting: *Deleted

## 2019-01-10 MED ORDER — CIPROFLOXACIN HCL 250 MG PO TABS: 250.0000 mg | ORAL_TABLET | Freq: Two times a day (BID) | ORAL | 0 refills | Status: AC

## 2019-01-10 NOTE — Telephone Encounter (Signed)
Notified patient as instructed, patient pleased. Discussed follow-up appointments, patient agrees  

## 2019-01-10 NOTE — Telephone Encounter (Signed)
-----   Message from Bjorn Loser, MD sent at 01/10/2019  6:34 AM EST ----- Ciprofloxacin 250 mg twice a day for 1 week; and go back on daily trimethoprim   ----- Message ----- From: Chrystie Nose, CMA Sent: 01/09/2019   1:45 PM EST To: Bjorn Loser, MD   ----- Message ----- From: Interface, [facility] Lab Results In Sent: 01/07/2019   4:36 PM EST To: Rowe [facility]

## 2019-01-11 ENCOUNTER — Ambulatory Visit (INDEPENDENT_AMBULATORY_CARE_PROVIDER_SITE_OTHER): Payer: Medicare Other | Admitting: Physician Assistant

## 2019-01-11 ENCOUNTER — Ambulatory Visit: Payer: Medicare Other | Admitting: Physician Assistant

## 2019-01-11 ENCOUNTER — Encounter: Payer: Self-pay | Admitting: Physician Assistant

## 2019-01-11 VITALS — BP 154/68 | HR 68 | Temp 96.9°F | Ht 60.0 in | Wt 112.0 lb

## 2019-01-11 DIAGNOSIS — R1310 Dysphagia, unspecified: Secondary | ICD-10-CM | POA: Diagnosis not present

## 2019-01-11 DIAGNOSIS — K5900 Constipation, unspecified: Secondary | ICD-10-CM

## 2019-01-11 DIAGNOSIS — K222 Esophageal obstruction: Secondary | ICD-10-CM | POA: Diagnosis not present

## 2019-01-11 DIAGNOSIS — R1319 Other dysphagia: Secondary | ICD-10-CM

## 2019-01-11 NOTE — Progress Notes (Signed)
Chief Complaint: Dysphagia  HPI:    Victoria Allison is an 78 year old female with a past medical history as listed below including CKD, CAD and others, known to Dr. Carlean Purl for her history of esophageal stenosis, who presents to clinic today with a complaint of dysphagia.    08/07/2017 patient was seen in clinic with recurrent dysphagia.  It was noted her last EGD prior to that was in March 2018 at which time she had [facility] dilation with fluoroscopy.  That time patient was scheduled for repeat EGD with dilation.  She was also started on MiraLAX for some constipation left lower quadrant pain.    08/22/2017 EGD done at [facility] with benign-appearing esophageal stenosis-rings-as in the past, dilated to 44 and 60 [place] with fluoroscopic guidance, exam otherwise normal.    Today, the patient tells me that for the past 6 or 7 months she has been having some trouble with swallowing and now her pills are starting to get stuck.  Tells me it takes her 3 big drinks of water to get her pills down.  Explains that her husband passed away in October 02, 2022 and for a while she was unable to take care of herself and now she has been trying to get into her doctors but they are all "delayed".  She continues on Omeprazole 40 mg daily    Tells me she uses MiraLAX every other day which keeps her bowels moving and alleviates all of her abdominal discomfort.    Denies fever, chills, weight loss, heartburn or reflux.  Past Medical History:  Diagnosis Date  . Allergic rhinitis   . CAD (coronary artery disease)   . Cancer ([place])    skin  . Chronic kidney disease    overactive bladder  . Colon polyp   . Diverticulosis    sigmoid and descending colon  . DM (diabetes mellitus) ([place])    no longer take any diabetic meds now that off Prednisone, type 2 diet controlled  . Elevated LFTs over one year ago  . Esophageal ring   . GERD (gastroesophageal reflux disease)   . History of hepatitis 1975   unsure of type  .  Hyperlipemia   . Hypertension   . Internal hemorrhoids   . Lichen sclerosus 39/03   (DX BY GYN AFTER ABN PAP)  . Pemphigoid, cicatricial    D/O AFFECTING EYES, MOUTH, THROAT NOW (DX [facility])  . Shingles 03/13/2013  . Urinary incontinence    OVERACTIVE BLADDER    Past Surgical History:  Procedure Laterality Date  . ABDOMINAL HYSTERECTOMY    . ANGIOPLASTY  yrs ago   LAD  . CARDIAC ELECTROPHYSIOLOGY MAPPING AND ABLATION    . CATARACT EXTRACTION Bilateral   . COLONOSCOPY    . ESOPHAGOGASTRODUODENOSCOPY  09/14/2011   Procedure: ESOPHAGOGASTRODUODENOSCOPY (EGD);  Surgeon: Gatha Mayer, MD;  Location: [facility];  Service: Endoscopy;  Laterality: N/A;  needs xray  . ESOPHAGOGASTRODUODENOSCOPY N/A 01/02/2013   Procedure: ESOPHAGOGASTRODUODENOSCOPY (EGD);  Surgeon: Gatha Mayer, MD;  Location: [facility];  Service: Endoscopy;  Laterality: N/A;  . ESOPHAGOGASTRODUODENOSCOPY N/A 04/12/2013   Procedure: ESOPHAGOGASTRODUODENOSCOPY (EGD);  Surgeon: Gatha Mayer, MD;  Location: [facility];  Service: Endoscopy;  Laterality: N/A;  . ESOPHAGOGASTRODUODENOSCOPY (EGD) WITH PROPOFOL N/A 05/24/2016   Procedure: ESOPHAGOGASTRODUODENOSCOPY (EGD) WITH PROPOFOL;  Surgeon: Gatha Mayer, MD;  [facility];  Service: Endoscopy;  Laterality: N/A;  . ESOPHAGOGASTRODUODENOSCOPY (EGD) WITH PROPOFOL N/A 08/22/2017   Procedure: ESOPHAGOGASTRODUODENOSCOPY (EGD) WITH PROPOFOL;  Surgeon:  Gatha Mayer, MD;  Location: [facility];  Service: Endoscopy;  Laterality: N/A;  . Venia Minks DILATION N/A 01/02/2013   Procedure: Venia Minks DILATION;  Surgeon: Gatha Mayer, MD;  [facility];  Service: Endoscopy;  Laterality: N/A;  . Venia Minks DILATION N/A 05/24/2016   Procedure: Venia Minks DILATION;  Surgeon: Gatha Mayer, MD;  [facility];  Service: Endoscopy;  Laterality: N/A;  . Venia Minks DILATION N/A 08/22/2017   Procedure: Venia Minks DILATION;  Surgeon: Gatha Mayer, MD;  [facility];   Service: Endoscopy;  Laterality: N/A;  . NOSE SURGERY     Biopsy  . OOPHORECTOMY    . SAVORY DILATION  09/14/2011   Procedure: SAVORY DILATION;  Surgeon: Gatha Mayer, MD;  [facility];  Service: Endoscopy;  Laterality: N/A;  . SAVORY DILATION N/A 01/02/2013   Procedure: SAVORY DILATION;  Surgeon: Gatha Mayer, MD;  [facility];  Service: Endoscopy;  Laterality: N/A;  . SAVORY DILATION N/A 04/12/2013   Procedure: SAVORY DILATION;  Surgeon: Gatha Mayer, MD;  [facility];  Service: Endoscopy;  Laterality: N/A;  . skin cancer removal  02/2016    Current Outpatient Medications  Medication Sig Dispense Refill  . acetaminophen (TYLENOL) 500 MG tablet Take 500 mg by mouth daily as needed for moderate pain or headache.    . brimonidine (ALPHAGAN) 0.2 % ophthalmic solution Place 1 drop into both eyes daily.    . ciprofloxacin (CIPRO) 250 MG tablet Take 1 tablet (250 mg total) by mouth 2 (two) times daily for 7 days. 14 tablet 0  . fluticasone (FLONASE) 50 MCG/ACT nasal spray Place 2 sprays into both nostrils daily as needed for allergies. 16 g 11  . lisinopril (ZESTRIL) 10 MG tablet Take 1 tablet by mouth once daily 90 tablet 1  . nitrofurantoin, macrocrystal-monohydrate, (MACROBID) 100 MG capsule Take 1 capsule (100 mg total) by mouth 2 (two) times daily. 14 capsule 0  . omeprazole (PRILOSEC) 40 MG capsule Take 1 capsule (40 mg total) by mouth daily. 90 capsule 3  . oxybutynin (DITROPAN XL) 15 MG 24 hr tablet Take 1 tablet (15 mg total) by mouth daily. 90 tablet 3  . pravastatin (PRAVACHOL) 40 MG tablet Take 1 tablet (40 mg total) by mouth daily. 90 tablet 3  . prednisoLONE acetate (OMNIPRED) 1 % ophthalmic suspension Place 1 drop into both eyes daily.     . sodium chloride (OCEAN) 0.65 % SOLN nasal spray Place 1 spray into both nostrils as needed for congestion.    . timolol (BETIMOL) 0.5 % ophthalmic solution Place 1 drop into both eyes 2 (two) times daily.      Marland Kitchen trimethoprim (TRIMPEX) 100 MG tablet Take 1 tablet (100 mg total) by mouth daily. 30 tablet 11   No current facility-administered medications for this visit.     Allergies as of 01/11/2019 - Review Complete 01/07/2019  Allergen Reaction Noted  . Aspirin Other (See Comments) 12/23/2008  . Lipitor [atorvastatin] Other (See Comments) 03/13/2007  . Ultram [tramadol] Other (See Comments) 03/22/2013  . Adhesive  [tape] Rash 06/19/2013  . Latex Itching and Rash 06/19/2013  . Tetanus toxoid Rash 03/13/2007    Family History  Problem Relation Age of Onset  . Lung cancer Father   . Esophageal cancer Son 90  . Stroke Mother        CVA  . Alzheimer's disease Mother   . Hypertension Brother   . Stomach cancer Son 12  . Breast  cancer Maternal Aunt   . Colon cancer Neg Hx   . Rectal cancer Neg Hx     Social History   Socioeconomic History  . Marital status: Married    Spouse name: Not on file  . Number of children: 2  . Years of education: Not on file  . Highest education level: Not on file  Occupational History  . Occupation: Retired    Fish farm manager: RETIRED  Social Needs  . Financial resource strain: Not on file  . Food insecurity    Worry: Not on file    Inability: Not on file  . Transportation needs    Medical: Not on file    Non-medical: Not on file  Tobacco Use  . Smoking status: Former Smoker    Quit date: 02/29/1968    Years since quitting: 50.9  . Smokeless tobacco: Never Used  Substance and Sexual Activity  . Alcohol use: No    Alcohol/week: 0.0 standard drinks  . Drug use: No  . Sexual activity: Not Currently  Lifestyle  . Physical activity    Days per week: Not on file    Minutes per session: Not on file  . Stress: Not on file  Relationships  . Social Herbalist on phone: Not on file    Gets together: Not on file    Attends religious service: Not on file    Active member of club or organization: Not on file    Attends meetings of clubs or  organizations: Not on file    Relationship status: Not on file  . Intimate partner violence    Fear of current or ex partner: Not on file    Emotionally abused: Not on file    Physically abused: Not on file    Forced sexual activity: Not on file  Other Topics Concern  . Not on file  Social History Narrative   Husband is fighting cancer. Exercises on stationary bike/walks   Daily caffeine     Review of Systems:    Constitutional: No weight loss, fever or chills Cardiovascular: No chest pain Respiratory: No SOB  Gastrointestinal: See HPI and otherwise negative   Physical Exam:  Vital signs: BP (!) 154/68   Pulse 68   Temp (!) 96.9 F (36.1 C)   Ht 5' (1.524 m)   Wt 112 lb (50.8 kg)   LMP 02/29/1968   BMI 21.87 kg/m   Constitutional:   Pleasant Elderly Caucasian female appears to be in NAD, Well developed, Well nourished, alert and cooperative Respiratory: Respirations even and unlabored. Lungs clear to auscultation bilaterally.   No wheezes, crackles, or rhonchi.  Cardiovascular: Normal S1, S2. No MRG. Regular rate and rhythm. No peripheral edema, cyanosis or pallor.  Gastrointestinal:  Soft, nondistended, nontender. No rebound or guarding. Normal bowel sounds. No appreciable masses or hepatomegaly. Rectal:  Not performed.  Psychiatric:  Demonstrates good judgement and reason without abnormal affect or behaviors.  RELEVANT LABS AND IMAGING: CBC    Component Value Date/Time   WBC 4.5 08/28/2018 0757   RBC 4.15 08/28/2018 0757   HGB 11.5 (L) 08/28/2018 0757   HCT 34.9 (L) 08/28/2018 0757   PLT 193.0 08/28/2018 0757   MCV 84.2 08/28/2018 0757   MCH 31.0 08/26/2009 0504   MCHC 32.8 08/28/2018 0757   RDW 14.6 08/28/2018 0757   LYMPHSABS 1.4 08/28/2018 0757   MONOABS 0.3 08/28/2018 0757   EOSABS 0.1 08/28/2018 0757   BASOSABS 0.0 08/28/2018 0757  CMP     Component Value Date/Time   NA 137 08/28/2018 0757   K 4.7 08/28/2018 0757   CL 103 08/28/2018 0757   CO2  28 08/28/2018 0757   GLUCOSE 88 08/28/2018 0757   BUN 13 08/28/2018 0757   CREATININE 0.77 08/28/2018 0757   CALCIUM 9.4 08/28/2018 0757   PROT 6.7 08/28/2018 0757   ALBUMIN 3.8 08/28/2018 0757   AST 17 08/28/2018 0757   ALT 12 08/28/2018 0757   ALKPHOS 84 08/28/2018 0757   BILITOT 0.3 08/28/2018 0757   GFRNONAA 92.08 02/09/2010 1056   GFRAA  09/22/2009 0619    >60        The eGFR has been calculated using the MDRD equation. This calculation has not been validated in all clinical situations. eGFR's persistently <60 mL/min signify possible Chronic Kidney Disease.    Assessment: 1.  Dysphagia: History of dysphagia in the past due to esophageal rings which has been treated with dilation with fluoroscopic guidance; likely the same now 2.  Constipation: Relieved with MiraLAX every other day  Plan: 1.  Scheduled patient for an EGD with dilation and fluoroscopy at [facility] with Dr. Carlean Purl.  Did discuss risks, benefits, limitations and alternatives and patient agrees to proceed. 2.  Reviewed antidysphagia measures including taking small bites, drinking sips of water in between bites, avoiding distraction while eating and the chin tuck technique. 3.  Continue Omeprazole 40 mg daily and MiraLAX every other day. 4.  Patient will follow in clinic per recommendations from Dr. Carlean Purl after procedure.  Ellouise Newer, PA-C [facility] 01/11/2019, 10:40 AM  Cc: Tower, Wynelle Fanny, MD

## 2019-01-11 NOTE — Patient Instructions (Signed)
If you are age 80 or older, your body mass index should be between 23-30. Your Body mass index is 21.87 kg/m. If this is out of the aforementioned range listed, please consider follow up with your Primary Care Provider.  If you are age 31 or younger, your body mass index should be between 19-25. Your Body mass index is 21.87 kg/m. If this is out of the aformentioned range listed, please consider follow up with your Primary Care Provider.    You have been scheduled for an endoscopy. Please follow written instructions given to you at your visit today. If you use inhalers (even only as needed), please bring them with you on the day of your procedure.  Continue Omeprazole once daily and Miralax as directed.   Thank you for choosing me and [facility].  Dennison Bulla

## 2019-01-27 ENCOUNTER — Other Ambulatory Visit: Payer: Self-pay | Admitting: Family Medicine

## 2019-01-29 ENCOUNTER — Other Ambulatory Visit: Payer: Self-pay | Admitting: Family Medicine

## 2019-02-23 ENCOUNTER — Other Ambulatory Visit (HOSPITAL_COMMUNITY)
Admission: RE | Admit: 2019-02-23 | Discharge: 2019-02-23 | Disposition: A | Discharge status: Home / Self Care | Payer: Medicare Other | Source: Ambulatory Visit | Attending: Internal Medicine | Admitting: Internal Medicine

## 2019-02-23 DIAGNOSIS — Z20828 Contact with and (suspected) exposure to other viral communicable diseases: Secondary | ICD-10-CM | POA: Insufficient documentation

## 2019-02-23 DIAGNOSIS — Z01812 Encounter for preprocedural laboratory examination: Secondary | ICD-10-CM | POA: Diagnosis present

## 2019-02-23 LAB — SARS CORONAVIRUS 2 (TAT 6-24 HRS): SARS Coronavirus 2: NEGATIVE

## 2019-02-26 ENCOUNTER — Other Ambulatory Visit: Payer: Self-pay

## 2019-02-26 ENCOUNTER — Ambulatory Visit (HOSPITAL_COMMUNITY)
Admission: RE | Admit: 2019-02-26 | Discharge: 2019-02-26 | Disposition: A | Discharge status: Home / Self Care | Payer: Medicare Other | Attending: Internal Medicine | Admitting: Internal Medicine

## 2019-02-26 ENCOUNTER — Ambulatory Visit (HOSPITAL_COMMUNITY): Payer: Medicare Other | Admitting: Certified Registered Nurse Anesthetist

## 2019-02-26 ENCOUNTER — Ambulatory Visit (HOSPITAL_COMMUNITY): Payer: Medicare Other

## 2019-02-26 ENCOUNTER — Encounter (HOSPITAL_COMMUNITY)
Admission: RE | Disposition: A | Discharge status: Home / Self Care | Payer: Self-pay | Source: Home / Self Care | Attending: Internal Medicine

## 2019-02-26 ENCOUNTER — Encounter (HOSPITAL_COMMUNITY): Payer: Self-pay | Admitting: Internal Medicine

## 2019-02-26 DIAGNOSIS — Z79899 Other long term (current) drug therapy: Secondary | ICD-10-CM | POA: Diagnosis not present

## 2019-02-26 DIAGNOSIS — Z86010 Personal history of colonic polyps: Secondary | ICD-10-CM | POA: Diagnosis not present

## 2019-02-26 DIAGNOSIS — K5900 Constipation, unspecified: Secondary | ICD-10-CM

## 2019-02-26 DIAGNOSIS — E785 Hyperlipidemia, unspecified: Secondary | ICD-10-CM | POA: Diagnosis not present

## 2019-02-26 DIAGNOSIS — N189 Chronic kidney disease, unspecified: Secondary | ICD-10-CM | POA: Insufficient documentation

## 2019-02-26 DIAGNOSIS — I2510 Atherosclerotic heart disease of native coronary artery without angina pectoris: Secondary | ICD-10-CM | POA: Insufficient documentation

## 2019-02-26 DIAGNOSIS — Z87891 Personal history of nicotine dependence: Secondary | ICD-10-CM | POA: Insufficient documentation

## 2019-02-26 DIAGNOSIS — N3281 Overactive bladder: Secondary | ICD-10-CM | POA: Diagnosis not present

## 2019-02-26 DIAGNOSIS — Z801 Family history of malignant neoplasm of trachea, bronchus and lung: Secondary | ICD-10-CM | POA: Diagnosis not present

## 2019-02-26 DIAGNOSIS — Z7951 Long term (current) use of inhaled steroids: Secondary | ICD-10-CM | POA: Diagnosis not present

## 2019-02-26 DIAGNOSIS — K5730 Diverticulosis of large intestine without perforation or abscess without bleeding: Secondary | ICD-10-CM | POA: Insufficient documentation

## 2019-02-26 DIAGNOSIS — Z8249 Family history of ischemic heart disease and other diseases of the circulatory system: Secondary | ICD-10-CM | POA: Insufficient documentation

## 2019-02-26 DIAGNOSIS — K2100 Gastro-esophageal reflux disease with esophagitis, without bleeding: Secondary | ICD-10-CM | POA: Insufficient documentation

## 2019-02-26 DIAGNOSIS — R1310 Dysphagia, unspecified: Secondary | ICD-10-CM | POA: Diagnosis present

## 2019-02-26 DIAGNOSIS — I129 Hypertensive chronic kidney disease with stage 1 through stage 4 chronic kidney disease, or unspecified chronic kidney disease: Secondary | ICD-10-CM | POA: Diagnosis not present

## 2019-02-26 DIAGNOSIS — R1013 Epigastric pain: Secondary | ICD-10-CM

## 2019-02-26 DIAGNOSIS — K222 Esophageal obstruction: Secondary | ICD-10-CM | POA: Insufficient documentation

## 2019-02-26 DIAGNOSIS — E1122 Type 2 diabetes mellitus with diabetic chronic kidney disease: Secondary | ICD-10-CM | POA: Insufficient documentation

## 2019-02-26 DIAGNOSIS — R1319 Other dysphagia: Secondary | ICD-10-CM

## 2019-02-26 HISTORY — PX: MALONEY DILATION: SHX5535

## 2019-02-26 HISTORY — PX: ESOPHAGOGASTRODUODENOSCOPY (EGD) WITH PROPOFOL: SHX5813

## 2019-02-26 SURGERY — ESOPHAGOGASTRODUODENOSCOPY (EGD) WITH PROPOFOL
Anesthesia: Monitor Anesthesia Care

## 2019-02-26 MED ORDER — LACTATED RINGERS IV SOLN: INTRAVENOUS | Status: DC

## 2019-02-26 MED ORDER — PROPOFOL 500 MG/50ML IV EMUL
INTRAVENOUS | Status: DC | PRN
  Administered 2019-02-26: 50 mg via INTRAVENOUS
  Administered 2019-02-26: 140 ug/kg/min via INTRAVENOUS

## 2019-02-26 MED ORDER — LIDOCAINE HCL (CARDIAC) PF 100 MG/5ML IV SOSY
PREFILLED_SYRINGE | INTRAVENOUS | Status: DC | PRN
  Administered 2019-02-26: 40 mg via INTRAVENOUS

## 2019-02-26 MED ORDER — SODIUM CHLORIDE 0.9 % IV SOLN: INTRAVENOUS | Status: DC

## 2019-02-26 SURGICAL SUPPLY — 15 items

## 2019-02-26 NOTE — Anesthesia Preprocedure Evaluation (Addendum)
Anesthesia Evaluation  Patient identified by MRN, date of birth, ID band Patient awake    Reviewed: Allergy & Precautions, NPO status , Patient's Chart, lab work & pertinent test results  Airway Mallampati: I  TM Distance: >3 FB Neck ROM: Full    Dental  (+) Upper Dentures, Lower Dentures   Pulmonary former smoker,    breath sounds clear to auscultation       Cardiovascular hypertension, Pt. on medications + CAD   Rhythm:Regular Rate:Normal     Neuro/Psych negative neurological ROS  negative psych ROS   GI/Hepatic Neg liver ROS, GERD  Medicated,  Endo/Other  diabetes  Renal/GU Renal disease     Musculoskeletal negative musculoskeletal ROS (+)   Abdominal Normal abdominal exam  (+)   Peds  Hematology negative hematology ROS (+)   Anesthesia Other Findings   Reproductive/Obstetrics                            Anesthesia Physical Anesthesia Plan  ASA: III  Anesthesia Plan: MAC   Post-op Pain Management:    Induction: Intravenous  PONV Risk Score and Plan: 0 and Propofol infusion  Airway Management Planned: Natural Airway and Nasal Cannula  Additional Equipment: None  Intra-op Plan:   Post-operative Plan:   Informed Consent: I have reviewed the patients History and Physical, chart, labs and discussed the procedure including the risks, benefits and alternatives for the proposed anesthesia with the patient or authorized representative who has indicated his/her understanding and acceptance.       Plan Discussed with: CRNA  Anesthesia Plan Comments:        Anesthesia Quick Evaluation

## 2019-02-26 NOTE — Discharge Instructions (Signed)
I dilated it same as last time - hope it helps.  I want you to call me next week and let me know how you are doing with the swallowing.  Happy New Year!  I appreciate the opportunity to care for you. Gatha Mayer, MD, FACG  YOU HAD AN ENDOSCOPIC PROCEDURE TODAY: Refer to the procedure report and other information in the discharge instructions given to you for any specific questions about what was found during the examination. If this information does not answer your questions, please call Dr. Celesta Aver office at [phone] to clarify.   YOU SHOULD EXPECT: Some feelings of bloating in the abdomen. Passage of more gas than usual. Walking can help get rid of the air that was put into your GI tract during the procedure and reduce the bloating. If you had a lower endoscopy (such as a colonoscopy or flexible sigmoidoscopy) you may notice spotting of blood in your stool or on the toilet paper. Some abdominal soreness may be present for a day or two, also.  DIET:   Clear liquids only today until 4 PM - if doing ok then soft foods tonight and then try regular texture foods tomorrow   ACTIVITY: Your care partner should take you home directly after the procedure. You should plan to take it easy, moving slowly for the rest of the day. You can resume normal activity the day after the procedure however YOU SHOULD NOT DRIVE, use power tools, machinery or perform tasks that involve climbing or major physical exertion for 24 hours (because of the sedation medicines used during the test).   SYMPTOMS TO REPORT IMMEDIATELY: A gastroenterologist can be reached at any hour. Please call [phone]  for any of the following symptoms:   Following upper endoscopy (EGD, EUS, ERCP, esophageal dilation) Vomiting of blood or coffee ground material  New, significant abdominal pain  New, significant chest pain or pain under the shoulder blades  Painful or persistently difficult swallowing  New shortness of breath   Black, tarry-looking or red, bloody stools

## 2019-02-26 NOTE — Transfer of Care (Signed)
Immediate Anesthesia Transfer of Care Note  Patient: Victoria Allison  Procedure(s) Performed: Procedure(s): ESOPHAGOGASTRODUODENOSCOPY (EGD) WITH PROPOFOL (N/A) [place] DILATION  Patient Location: PACU  Anesthesia Type:MAC  Level of Consciousness:  sedated, patient cooperative and responds to stimulation  Airway & Oxygen Therapy:Patient Spontanous Breathing and Patient connected to face mask oxgen  Post-op Assessment:  Report given to PACU RN and Post -op Vital signs reviewed and stable  Post vital signs:  Reviewed and stable  Last Vitals:  Vitals:   02/26/19 0918  BP: (!) 169/70  Pulse: 69  Resp: 12  Temp: 36.8 C  SpO2: 729%    Complications: No apparent anesthesia complications

## 2019-02-26 NOTE — Op Note (Addendum)
[facility] Patient Name: Victoria Allison Procedure Date: 02/26/2019 MRN: [id number] Attending MD: Gatha Mayer , MD Date of Birth: [birth year] CSN: [id number] Age: 78 Admit Type: Outpatient Procedure:                Upper GI endoscopy Indications:              Dysphagia, Stricture of the esophagus, For therapy                            of esophageal stricture Providers:                Gatha Mayer, MD, Baird Cancer, RN, Marguerita Merles, Technician, Corie Chiquito, Technician,                            Arnoldo Hooker, CRNA, Truddie Coco, RN Referring MD:              Medicines:                Propofol per Anesthesia, Monitored Anesthesia Care Complications:            No immediate complications. Estimated Blood Loss:     Estimated blood loss was minimal. Procedure:                Pre-Anesthesia Assessment:                           - Prior to the procedure, a History and Physical                            was performed, and patient medications and                            allergies were reviewed. The patient's tolerance of                            previous anesthesia was also reviewed. The risks                            and benefits of the procedure and the sedation                            options and risks were discussed with the patient.                            All questions were answered, and informed consent                            was obtained. Prior Anticoagulants: The patient has                            taken no previous anticoagulant or antiplatelet  agents. ASA Grade Assessment: III - A patient with                            severe systemic disease. After reviewing the risks                            and benefits, the patient was deemed in                            satisfactory condition to undergo the procedure.                           After obtaining informed consent, the endoscope was                            passed under direct vision. Throughout the                            procedure, the patient's blood pressure, pulse, and                            oxygen saturations were monitored continuously. The                            [id number]-[id number]) Olympus gastroscope was                            introduced through the mouth, and advanced to the                            body of the stomach. The upper GI endoscopy was                            accomplished without difficulty. The patient                            tolerated the procedure well. Scope In: Scope Out: Findings:      Multiple benign-appearing, intrinsic moderate stenoses were found in the       entire esophagus. The stenoses were traversed. The scope was withdrawn.       Dilation was performed with a Maloney dilator with no resistance at 42       Fr. The scope was withdrawn. Dilation was performed with a Maloney       dilator with mild resistance at 44 Fr. The scope was withdrawn. Dilation       was performed with a Maloney dilator with mild resistance at 46 Fr. The       scope was withdrawn. Dilation was performed with a Maloney dilator with       moderate resistance at 48 Fr. The dilation site was examined following       endoscope reinsertion and showed mild mucosal disruption. Estimated       blood loss was minimal.      The exam was otherwise without abnormality.      The cardia and gastric fundus were normal on retroflexion. Impression:               -  Benign-appearing esophageal stenoses. Dilated.                            MULTIPLE RINGS W/ KNOWN LYMPHOCYTIC ESOPHAGITIS                            DILATED TO 63 FRENCH MAX                           - The examination was otherwise normal.                           - No specimens collected. Moderate Sedation:      Not Applicable - Patient had care per Anesthesia. Recommendation:           - Patient has a contact number available for                             emergencies. The signs and symptoms of potential                            delayed complications were discussed with the                            patient. Return to normal activities tomorrow.                            Written discharge instructions were provided to the                            patient.                           - Clear liquids x 4 hours then soft foods rest of                            day. Start prior diet tomorrow.                           - Continue present medications.                           - PATRIENT TO CALL BACK WITH SX UPDATE                           WILL DETERMINE NEED/TIMING OF ADDITIONAL DILATION                            THEN                           CONSIDER RETX BUDESONIDE SLUURRY Procedure Code(s):        --- Professional ---                           [id number], Esophagoscopy, flexible, transoral;  diagnostic, including collection of specimen(s) by                            brushing or washing, when performed (separate                            procedure)                           43450, Dilation of esophagus, by unguided sound or                            bougie, single or multiple passes Diagnosis Code(s):        --- Professional ---                           K22.2, Esophageal obstruction                           R13.10, Dysphagia, unspecified CPT copyright 2019 American Medical Association. All rights reserved. The codes documented in this report are preliminary and upon coder review may  be revised to meet current compliance requirements. Gatha Mayer, MD 02/26/2019 12:41:10 PM This report has been signed electronically. Number of Addenda: 0

## 2019-02-26 NOTE — Anesthesia Postprocedure Evaluation (Signed)
Anesthesia Post Note  Patient: Victoria Allison  Procedure(s) Performed: ESOPHAGOGASTRODUODENOSCOPY (EGD) WITH PROPOFOL (N/A ) [place] DILATION     Patient location during evaluation: PACU Anesthesia Type: MAC Level of consciousness: awake and alert Pain management: pain level controlled Vital Signs Assessment: post-procedure vital signs reviewed and stable Respiratory status: spontaneous breathing, nonlabored ventilation, respiratory function stable and patient connected to nasal cannula oxygen Cardiovascular status: stable and blood pressure returned to baseline Postop Assessment: no apparent nausea or vomiting Anesthetic complications: no    Last Vitals:  Vitals:   02/26/19 1240 02/26/19 1250  BP: (!) 153/100 (!) 164/69  Pulse: 80 71  Resp: 15 16  Temp:    SpO2: 100% 100%    Last Pain:  Vitals:   02/26/19 1250  TempSrc:   PainSc: 0-No pain                 Effie Berkshire

## 2019-02-26 NOTE — H&P (Signed)
[facility] History and Physical   Primary Care Physician:  Tower, Wynelle Fanny, MD   Reason for Procedure:   dysphagia  Plan:    egd and dilation     HPI: Victoria Allison is a 78 y.o. female with dysphagia from lymphocytic esophagitis here for egd and dilation of esophagus   Past Medical History:  Diagnosis Date  . Allergic rhinitis   . CAD (coronary artery disease)   . Cancer ([place])    skin  . Chronic kidney disease    overactive bladder  . Colon polyp   . Diverticulosis    sigmoid and descending colon  . DM (diabetes mellitus) ([place])    no longer take any diabetic meds now that off Prednisone, type 2 diet controlled  . Elevated LFTs over one year ago  . Esophageal ring   . GERD (gastroesophageal reflux disease)   . History of hepatitis 1975   unsure of type  . Hyperlipemia   . Hypertension   . Internal hemorrhoids   . Lichen sclerosus 123456   (DX BY GYN AFTER ABN PAP)  . Pemphigoid, cicatricial    D/O AFFECTING EYES, MOUTH, THROAT NOW (DX [facility])  . Shingles 03/13/2013  . Urinary incontinence    OVERACTIVE BLADDER    Past Surgical History:  Procedure Laterality Date  . ABDOMINAL HYSTERECTOMY    . ANGIOPLASTY  yrs ago   LAD  . CARDIAC ELECTROPHYSIOLOGY MAPPING AND ABLATION    . CATARACT EXTRACTION Bilateral   . COLONOSCOPY    . ESOPHAGOGASTRODUODENOSCOPY  09/14/2011   Procedure: ESOPHAGOGASTRODUODENOSCOPY (EGD);  Surgeon: Gatha Mayer, MD;  Location: [facility];  Service: Endoscopy;  Laterality: N/A;  needs xray  . ESOPHAGOGASTRODUODENOSCOPY N/A 01/02/2013   Procedure: ESOPHAGOGASTRODUODENOSCOPY (EGD);  Surgeon: Gatha Mayer, MD;  Location: [facility];  Service: Endoscopy;  Laterality: N/A;  . ESOPHAGOGASTRODUODENOSCOPY N/A 04/12/2013   Procedure: ESOPHAGOGASTRODUODENOSCOPY (EGD);  Surgeon: Gatha Mayer, MD;  Location: [facility];  Service: Endoscopy;  Laterality: N/A;  . ESOPHAGOGASTRODUODENOSCOPY (EGD) WITH PROPOFOL N/A 05/24/2016   Procedure: ESOPHAGOGASTRODUODENOSCOPY (EGD) WITH PROPOFOL;  Surgeon: Gatha Mayer, MD;  Location: [facility];  Service: Endoscopy;  Laterality: N/A;  . ESOPHAGOGASTRODUODENOSCOPY (EGD) WITH PROPOFOL N/A 08/22/2017   Procedure: ESOPHAGOGASTRODUODENOSCOPY (EGD) WITH PROPOFOL;  Surgeon: Gatha Mayer, MD;  Location: [facility];  Service: Endoscopy;  Laterality: N/A;  . Victoria Allison DILATION N/A 01/02/2013   Procedure: Victoria Allison DILATION;  Surgeon: Gatha Mayer, MD;  Location: [facility];  Service: Endoscopy;  Laterality: N/A;  . Victoria Allison DILATION N/A 05/24/2016   Procedure: Victoria Allison DILATION;  Surgeon: Gatha Mayer, MD;  Location: [facility];  Service: Endoscopy;  Laterality: N/A;  . Victoria Allison DILATION N/A 08/22/2017   Procedure: Victoria Allison DILATION;  Surgeon: Gatha Mayer, MD;  Location: [facility];  Service: Endoscopy;  Laterality: N/A;  . NOSE SURGERY     Biopsy  . OOPHORECTOMY    . SAVORY DILATION  09/14/2011   Procedure: SAVORY DILATION;  Surgeon: Gatha Mayer, MD;  Location: [facility];  Service: Endoscopy;  Laterality: N/A;  . SAVORY DILATION N/A 01/02/2013   Procedure: SAVORY DILATION;  Surgeon: Gatha Mayer, MD;  Location: [facility];  Service: Endoscopy;  Laterality: N/A;  . SAVORY DILATION N/A 04/12/2013   Procedure: SAVORY DILATION;  Surgeon: Gatha Mayer, MD;  Location: [facility];  Service: Endoscopy;  Laterality: N/A;  . skin cancer removal  02/2016    Prior to Admission medications   Medication Sig Start  Date End Date Taking? Authorizing Provider  brimonidine (ALPHAGAN) 0.2 % ophthalmic solution Place 1 drop into both eyes daily.   Yes [provider]  fluticasone (FLONASE) 50 MCG/ACT nasal spray Place 2 sprays into both nostrils daily as needed for allergies. Patient taking differently: Place 2 sprays into both nostrils daily.  07/19/17  Yes Tower, Wynelle Fanny, MD  lisinopril (ZESTRIL) 10 MG tablet Take 1 tablet by mouth once daily Patient taking differently:  Take 10 mg by mouth daily.  01/28/19  Yes Tower, Wynelle Fanny, MD  omeprazole (PRILOSEC) 40 MG capsule Take 1 capsule (40 mg total) by mouth daily. 09/04/18  Yes Tower, Wynelle Fanny, MD  oxybutynin (DITROPAN XL) 15 MG 24 hr tablet Take 1 tablet (15 mg total) by mouth daily. 09/04/18  Yes Tower, Wynelle Fanny, MD  pravastatin (PRAVACHOL) 40 MG tablet Take 1 tablet (40 mg total) by mouth daily. Patient taking differently: Take 40 mg by mouth at bedtime.  09/04/18  Yes Tower, Wynelle Fanny, MD  prednisoLONE acetate (OMNIPRED) 1 % ophthalmic suspension Place 1 drop into both eyes daily.    Yes [provider]  sodium chloride (OCEAN) 0.65 % SOLN nasal spray Place 1 spray into both nostrils daily as needed for congestion.    Yes [provider]  timolol (BETIMOL) 0.5 % ophthalmic solution Place 1 drop into both eyes 2 (two) times daily.    Yes [provider]  trimethoprim (TRIMPEX) 100 MG tablet Take 1 tablet (100 mg total) by mouth daily. Patient taking differently: Take 100 mg by mouth every evening.  01/07/19  Yes MacDiarmid, Nicki Reaper, MD  acetaminophen (TYLENOL) 500 MG tablet Take 500 mg by mouth daily as needed for moderate pain or headache.    [provider]    Current Facility-Administered Medications  Medication Dose Route Frequency Provider Last Rate Last Admin  . 0.9 %  sodium chloride infusion   Intravenous Continuous Gatha Mayer, MD      . lactated ringers infusion   Intravenous Continuous Gatha Mayer, MD   New Bag at 02/26/19 0935    Allergies as of 01/11/2019 - Review Complete 01/11/2019  Allergen Reaction Noted  . Aspirin Other (See Comments) 12/23/2008  . Lipitor [atorvastatin] Other (See Comments) 03/13/2007  . Ultram [tramadol] Other (See Comments) 03/22/2013  . Adhesive  [tape] Rash 06/19/2013  . Latex Itching and Rash 06/19/2013  . Tetanus toxoid Rash 03/13/2007    Family History  Problem Relation Age of Onset  . Lung cancer Father   . Esophageal cancer  Son 67  . Stomach cancer Son 48  . Stroke Mother        CVA  . Alzheimer's disease Mother   . Hypertension Brother   . Breast cancer Maternal Aunt   . Colon cancer Neg Hx   . Rectal cancer Neg Hx       Review of Systems: + HEARING AIDS All other review of systems negative except as mentioned in the HPI.  Physical Exam: Vital signs in last 24 hours: Temp:  [98.3 F (36.8 C)] 98.3 F (36.8 C) (12/29 0918) Pulse Rate:  [69] 69 (12/29 0918) Resp:  [12] 12 (12/29 0918) BP: (169)/(70) 169/70 (12/29 0918) SpO2:  [100 %] 100 % (12/29 0918) Weight:  [49 kg] 49 kg (12/29 0918)   General:   Alert,  Well-developed, well-nourished, pleasant and cooperative in NAD Lungs:  Clear throughout to auscultation.   Heart:  Regular rate and rhythm; no murmurs, clicks,  rubs,  or gallops. Abdomen:  Soft, nontender and nondistended. Normal bowel sounds.   Neuro/Psych:  Alert and cooperative. Normal mood and affect. A and O x 3   @Saber Dickerman  Simonne Maffucci, MD, [facility] [phone] (pager) 02/26/2019 12:02 PM@

## 2019-02-27 ENCOUNTER — Encounter: Payer: Self-pay | Admitting: *Deleted

## 2019-03-05 ENCOUNTER — Telehealth: Payer: Self-pay | Admitting: Internal Medicine

## 2019-03-06 NOTE — Telephone Encounter (Signed)
Recall placed for 07/2019

## 2019-03-06 NOTE — Telephone Encounter (Signed)
Glad to hear that she is doing well   Please place an EGD recall for June 2021

## 2019-03-07 ENCOUNTER — Other Ambulatory Visit: Payer: Self-pay | Admitting: Urology

## 2019-03-07 ENCOUNTER — Encounter: Payer: Self-pay | Admitting: Urology

## 2019-03-07 ENCOUNTER — Ambulatory Visit (INDEPENDENT_AMBULATORY_CARE_PROVIDER_SITE_OTHER): Payer: Medicare Other | Admitting: Urology

## 2019-03-07 ENCOUNTER — Other Ambulatory Visit: Payer: Self-pay

## 2019-03-07 VITALS — BP 133/69 | HR 77 | Ht 60.0 in | Wt 108.0 lb

## 2019-03-07 DIAGNOSIS — D494 Neoplasm of unspecified behavior of bladder: Secondary | ICD-10-CM

## 2019-03-07 DIAGNOSIS — R32 Unspecified urinary incontinence: Secondary | ICD-10-CM

## 2019-03-07 DIAGNOSIS — N3289 Other specified disorders of bladder: Secondary | ICD-10-CM | POA: Diagnosis not present

## 2019-03-07 DIAGNOSIS — R3129 Other microscopic hematuria: Secondary | ICD-10-CM | POA: Diagnosis not present

## 2019-03-07 NOTE — Addendum Note (Signed)
Addended by: Gaspar Cola L on: 03/07/2019 10:10 AM   Modules accepted: Orders

## 2019-03-07 NOTE — Progress Notes (Signed)
Cystoscopy Procedure Note:  Indication: Possible bladder lesion  Briefly, patient is an 79 year old female followed by Dr. Matilde Sprang for incontinence and recurrent UTIs who on recent cystoscopy 01/07/2019 was found to have some irregularity of the bladder mucosa that was unclear whether related to cystitis or bladder tumor.  She was set up for me for 56-month follow-up cystoscopy after being on suppressive antibiotics to evaluate if this was cystitis related or a bladder tumor.  After informed consent and discussion of the procedure and its risks, Victoria Allison was positioned and prepped in the standard fashion. Cystoscopy was performed with a flexible cystoscope. The urethra, bladder neck and entire bladder was visualized in a standard fashion.  The bladder mucosa was notable for some papillary appearing tumor in the right bladder wall approximately 1 cm in size.  This appeared concerning for low-grade papillary urothelial cell carcinoma.. The ureteral orifices were visualized in their normal location and orientation.  Findings: Small <1 cm papillary bladder tumor concerning for urothelial cell carcinoma  Assessment and Plan: We discussed cystoscopy and bladder biopsy/fulguration and risks and benefits at length. This is typically a 30-minute procedure done under general anesthesia in the operating room.  A scope is inserted through the urethra and used to remove abnormal tissue within the bladder, which is then sent to the pathologist to determine grade and stage of the tumor.  Risks include bleeding, infection, need for temporary Foley placement, and bladder perforation.  Treatment strategies are based on the type of tumor and depth of invasion.  We briefly reviewed the different treatment pathways for non-muscle invasive and muscle invasive bladder cancer.  Schedule cystoscopy, bladder biopsy and fulguration, gemcitabine  Nickolas Madrid, MD 03/07/2019

## 2019-03-08 LAB — MICROSCOPIC EXAMINATION
Bacteria, UA: NONE SEEN
RBC, Urine: NONE SEEN /hpf (ref 0–2)

## 2019-03-08 LAB — URINALYSIS, COMPLETE
Bilirubin, UA: NEGATIVE
Glucose, UA: NEGATIVE
Ketones, UA: NEGATIVE
Nitrite, UA: NEGATIVE
Protein,UA: NEGATIVE
RBC, UA: NEGATIVE
Specific Gravity, UA: 1.015 (ref 1.005–1.030)
Urobilinogen, Ur: 0.2 mg/dL (ref 0.2–1.0)
pH, UA: 6 (ref 5.0–7.5)

## 2019-03-10 LAB — CULTURE, URINE COMPREHENSIVE

## 2019-03-13 ENCOUNTER — Encounter: Payer: Self-pay | Admitting: Family Medicine

## 2019-03-13 ENCOUNTER — Other Ambulatory Visit: Payer: Self-pay

## 2019-03-13 ENCOUNTER — Ambulatory Visit (INDEPENDENT_AMBULATORY_CARE_PROVIDER_SITE_OTHER): Payer: Medicare Other | Admitting: Family Medicine

## 2019-03-13 ENCOUNTER — Other Ambulatory Visit: Payer: Self-pay | Admitting: Urology

## 2019-03-13 VITALS — BP 112/64 | HR 68 | Temp 96.6°F | Ht 60.0 in | Wt 110.6 lb

## 2019-03-13 DIAGNOSIS — I2510 Atherosclerotic heart disease of native coronary artery without angina pectoris: Secondary | ICD-10-CM | POA: Diagnosis not present

## 2019-03-13 DIAGNOSIS — I10 Essential (primary) hypertension: Secondary | ICD-10-CM | POA: Diagnosis not present

## 2019-03-13 DIAGNOSIS — Z01810 Encounter for preprocedural cardiovascular examination: Secondary | ICD-10-CM

## 2019-03-13 DIAGNOSIS — R7309 Other abnormal glucose: Secondary | ICD-10-CM

## 2019-03-13 DIAGNOSIS — Z01818 Encounter for other preprocedural examination: Secondary | ICD-10-CM

## 2019-03-13 DIAGNOSIS — Z8679 Personal history of other diseases of the circulatory system: Secondary | ICD-10-CM | POA: Diagnosis not present

## 2019-03-13 NOTE — Assessment & Plan Note (Signed)
Resolved completely years ago with an ablation  No re occurance

## 2019-03-13 NOTE — Assessment & Plan Note (Signed)
Lab Results  Component Value Date   HGBA1C 6.0 08/28/2018   Controlled with diet Not diabetic

## 2019-03-13 NOTE — Assessment & Plan Note (Signed)
Stable w/o angina  Lipids and bp are well controlled

## 2019-03-13 NOTE — Patient Instructions (Addendum)
I do not expect any problems with your upcoming urologic surgery  I will send the paperwork in to clear you   Take care of yourself   Keep wearing your mask

## 2019-03-13 NOTE — Progress Notes (Signed)
Subjective:    Patient ID: Victoria Allison, female    DOB: 06/29/1940, 79 y.o.   MRN: [id number]  This visit occurred during the SARS-CoV-2 public health emergency.  Safety protocols were in place, including screening questions prior to the visit, additional usage of staff PPE, and extensive cleaning of exam room while observing appropriate contact time as indicated for disinfecting solutions.    HPI Pt presents for preoperative clearance for bladder surgery (bladder tumor possible)  Very small-planning removal  Dr Diamantina Providence  Planned for 1/22 Planning on sedation -unsure if general anesthesia   Pt has discomfort to urinate  - just when urinating ) Also incontinence (baseline)  Has to wear a pad all the time    Wt Readings from Last 3 Encounters:  03/13/19 110 lb 9 oz (50.2 kg)  03/07/19 108 lb (49 kg)  02/26/19 108 lb (49 kg)   21.59 kg/m   Has never had trouble with anesthesia   bp is stable today  No cp or palpitations or headaches or edema  No side effects to medicines  BP Readings from Last 3 Encounters:  03/13/19 112/64  03/07/19 133/69  02/26/19 (!) 164/69     Pulse Readings from Last 3 Encounters:  03/13/19 68  03/07/19 77  02/26/19 71   She gets a rash from latex and adhesive  Had rash from at tetanus shot (not anaphylaxis)  Dizziness from tramadol Easy bleeding from aspirin   EKG today : NSR with rate of 66 Small T wave in V2 Low voltage per computer interp as well as rightward P axis rotation (she has not h/o pulmonary problems)  No change from 10/12   Of note had a heart ablation in the past for SVT  (2012) it never came back  Per pt -she does not think she had CAD -but history indicates so in 2011 No problems or angina  Last nuclear study was normal    Has h/o baseline urinary incontinence and utis in the past  Regular urologist is Dr Matilde Sprang   She does not take any blood thinners  No nsaids at all   Does take an occasional tylenol    Never had a blood clot    Not a lot of appetite but stomach feels ok  Taking omeprazole   Gums and mouth worsen from pemphigoid   Patient Active Problem List   Diagnosis Date Noted  . Pre-operative examination 03/13/2019  . Hoarseness, persistent 10/30/2018  . Laryngitis, acute 04/11/2018  . Constipation 08/07/2017  . Screening mammogram, encounter for 07/19/2017  . Elevated glucose 07/19/2017  . Colon cancer screening 12/13/2013  . Dysphagia 04/12/2013  . Encounter for Medicare annual wellness exam 12/10/2012  . Esophageal rings 08/24/2011  . History of supraventricular tachycardia 09/22/2009  . ARTHRITIS, SHOULDER 05/21/2009  . INSOMNIA 02/05/2009  . POSTMENOPAUSAL STATUS 06/02/2008  . PEMPHIGOID 11/14/2007  . Nonspecific (abnormal) findings on radiological and other examination of body structure 07/05/2007  . Hyperlipidemia LDL goal <100 03/13/2007  . Essential hypertension 03/13/2007  . Coronary atherosclerosis 03/13/2007  . Allergic rhinitis 03/13/2007  . GERD 03/13/2007  . HAIR LOSS 03/13/2007  . LEG PAIN, CHRONIC 03/13/2007  . URINARY INCONTINENCE 03/13/2007  . COLONIC POLYPS, HX OF 03/13/2007  . HX, URINARY INFECTION 08/14/2006   Past Medical History:  Diagnosis Date  . Allergic rhinitis   . CAD (coronary artery disease)   . Cancer ([place])    skin  . Chronic kidney disease    overactive  bladder  . Colon polyp   . Diverticulosis    sigmoid and descending colon  . DM (diabetes mellitus) ([place])    no longer take any diabetic meds now that off Prednisone, type 2 diet controlled  . Elevated LFTs over one year ago  . Esophageal ring   . GERD (gastroesophageal reflux disease)   . History of hepatitis 1975   unsure of type  . Hyperlipemia   . Hypertension   . Internal hemorrhoids   . Lichen sclerosus 123456   (DX BY GYN AFTER ABN PAP)  . Pemphigoid, cicatricial    D/O AFFECTING EYES, MOUTH, THROAT NOW (DX [facility])  . Shingles 03/13/2013  . Urinary incontinence     OVERACTIVE BLADDER   Past Surgical History:  Procedure Laterality Date  . ABDOMINAL HYSTERECTOMY    . ANGIOPLASTY  yrs ago   LAD  . CARDIAC ELECTROPHYSIOLOGY MAPPING AND ABLATION    . CATARACT EXTRACTION Bilateral   . COLONOSCOPY    . ESOPHAGOGASTRODUODENOSCOPY  09/14/2011   Procedure: ESOPHAGOGASTRODUODENOSCOPY (EGD);  Surgeon: Gatha Mayer, MD;  Location: [facility];  Service: Endoscopy;  Laterality: N/A;  needs xray  . ESOPHAGOGASTRODUODENOSCOPY N/A 01/02/2013   Procedure: ESOPHAGOGASTRODUODENOSCOPY (EGD);  Surgeon: Gatha Mayer, MD;  Location: [facility];  Service: Endoscopy;  Laterality: N/A;  . ESOPHAGOGASTRODUODENOSCOPY N/A 04/12/2013   Procedure: ESOPHAGOGASTRODUODENOSCOPY (EGD);  Surgeon: Gatha Mayer, MD;  Location: [facility];  Service: Endoscopy;  Laterality: N/A;  . ESOPHAGOGASTRODUODENOSCOPY (EGD) WITH PROPOFOL N/A 05/24/2016   Procedure: ESOPHAGOGASTRODUODENOSCOPY (EGD) WITH PROPOFOL;  Surgeon: Gatha Mayer, MD;  Location: [facility];  Service: Endoscopy;  Laterality: N/A;  . ESOPHAGOGASTRODUODENOSCOPY (EGD) WITH PROPOFOL N/A 08/22/2017   Procedure: ESOPHAGOGASTRODUODENOSCOPY (EGD) WITH PROPOFOL;  Surgeon: Gatha Mayer, MD;  Location: [facility];  Service: Endoscopy;  Laterality: N/A;  . ESOPHAGOGASTRODUODENOSCOPY (EGD) WITH PROPOFOL N/A 02/26/2019   Procedure: ESOPHAGOGASTRODUODENOSCOPY (EGD) WITH PROPOFOL;  Surgeon: Gatha Mayer, MD;  Location: [facility];  Service: Endoscopy;  Laterality: N/A;  . Venia Minks DILATION N/A 01/02/2013   Procedure: Venia Minks DILATION;  Surgeon: Gatha Mayer, MD;  Location: [facility];  Service: Endoscopy;  Laterality: N/A;  . Venia Minks DILATION N/A 05/24/2016   Procedure: Venia Minks DILATION;  Surgeon: Gatha Mayer, MD;  Location: [facility];  Service: Endoscopy;  Laterality: N/A;  . Venia Minks DILATION N/A 08/22/2017   Procedure: Venia Minks DILATION;  Surgeon: Gatha Mayer, MD;  Location: [facility];  Service: Endoscopy;   Laterality: N/A;  Venia Minks DILATION  02/26/2019   Procedure: Venia Minks DILATION;  Surgeon: Gatha Mayer, MD;  Location: [facility];  Service: Endoscopy;;  . NOSE SURGERY     Biopsy  . OOPHORECTOMY    . SAVORY DILATION  09/14/2011   Procedure: SAVORY DILATION;  Surgeon: Gatha Mayer, MD;  Location: [facility];  Service: Endoscopy;  Laterality: N/A;  . SAVORY DILATION N/A 01/02/2013   Procedure: SAVORY DILATION;  Surgeon: Gatha Mayer, MD;  Location: [facility];  Service: Endoscopy;  Laterality: N/A;  . SAVORY DILATION N/A 04/12/2013   Procedure: SAVORY DILATION;  Surgeon: Gatha Mayer, MD;  Location: [facility];  Service: Endoscopy;  Laterality: N/A;  . skin cancer removal  02/2016   Social History   Tobacco Use  . Smoking status: Former Smoker    Quit date: 02/29/1968    Years since quitting: 51.0  . Smokeless tobacco: Never Used  Substance Use Topics  . Alcohol use: No    Alcohol/week: 0.0 standard drinks  .  Drug use: No   Family History  Problem Relation Age of Onset  . Lung cancer Father   . Esophageal cancer Son 49  . Stomach cancer Son 26  . Stroke Mother        CVA  . Alzheimer's disease Mother   . Hypertension Brother   . Breast cancer Maternal Aunt   . Colon cancer Neg Hx   . Rectal cancer Neg Hx    Allergies  Allergen Reactions  . Aspirin Other (See Comments)    Bleeds too easily  . Lipitor [Atorvastatin] Other (See Comments)    Elevated LFT's  . Ultram [Tramadol] Other (See Comments)    Dizziness  . Adhesive  [Tape] Rash  . Latex Itching and Rash  . Tetanus Toxoid Rash   Current Outpatient Medications on File Prior to Visit  Medication Sig Dispense Refill  . acetaminophen (TYLENOL) 500 MG tablet Take 500 mg by mouth daily as needed for moderate pain or headache.    . brimonidine (ALPHAGAN) 0.2 % ophthalmic solution Place 1 drop into both eyes daily.    . fluticasone (FLONASE) 50 MCG/ACT nasal spray Place 2 sprays into both nostrils daily as  needed for allergies. (Patient taking differently: Place 2 sprays into both nostrils daily. ) 16 g 11  . lisinopril (ZESTRIL) 10 MG tablet Take 1 tablet by mouth once daily (Patient taking differently: Take 10 mg by mouth daily. ) 90 tablet 0  . omeprazole (PRILOSEC) 40 MG capsule Take 1 capsule (40 mg total) by mouth daily. 90 capsule 3  . oxybutynin (DITROPAN XL) 15 MG 24 hr tablet Take 1 tablet (15 mg total) by mouth daily. 90 tablet 3  . pravastatin (PRAVACHOL) 40 MG tablet Take 1 tablet (40 mg total) by mouth daily. (Patient taking differently: Take 40 mg by mouth at bedtime. ) 90 tablet 3  . prednisoLONE acetate (OMNIPRED) 1 % ophthalmic suspension Place 1 drop into both eyes daily.     . sodium chloride (OCEAN) 0.65 % SOLN nasal spray Place 1 spray into both nostrils daily as needed for congestion.     . timolol (BETIMOL) 0.5 % ophthalmic solution Place 1 drop into both eyes 2 (two) times daily.     Marland Kitchen trimethoprim (TRIMPEX) 100 MG tablet Take 1 tablet (100 mg total) by mouth daily. (Patient taking differently: Take 100 mg by mouth every evening. ) 30 tablet 11   No current facility-administered medications on file prior to visit.    Review of Systems  Constitutional: Negative for activity change, appetite change, fatigue, fever and unexpected weight change.  HENT: Negative for congestion, ear pain, rhinorrhea, sinus pressure and sore throat.        Mouth/dental issues from pemphigoid  Eyes: Negative for pain, redness and visual disturbance.  Respiratory: Negative for cough, shortness of breath and wheezing.   Cardiovascular: Negative for chest pain and palpitations.  Gastrointestinal: Negative for abdominal pain, blood in stool, constipation and diarrhea.  Endocrine: Negative for polydipsia and polyuria.  Genitourinary: Positive for dysuria, frequency and urgency. Negative for hematuria.  Musculoskeletal: Negative for arthralgias, back pain and myalgias.  Skin: Negative for pallor and  rash.  Allergic/Immunologic: Negative for environmental allergies.  Neurological: Negative for dizziness, syncope and headaches.  Hematological: Negative for adenopathy. Does not bruise/bleed easily.  Psychiatric/Behavioral: Negative for decreased concentration and dysphoric mood. The patient is not nervous/anxious.        Objective:   Physical Exam Constitutional:      General: She  is not in acute distress.    Appearance: Normal appearance. She is well-developed and normal weight. She is not ill-appearing or diaphoretic.  HENT:     Head: Normocephalic and atraumatic.  Eyes:     General: No scleral icterus.       Right eye: No discharge.        Left eye: No discharge.     Conjunctiva/sclera: Conjunctivae normal.     Pupils: Pupils are equal, round, and reactive to light.  Neck:     Thyroid: No thyromegaly.     Vascular: No carotid bruit or JVD.  Cardiovascular:     Rate and Rhythm: Normal rate and regular rhythm.     Heart sounds: Normal heart sounds. No gallop.   Pulmonary:     Effort: Pulmonary effort is normal. No respiratory distress.     Breath sounds: Normal breath sounds. No wheezing or rales.     Comments: Good air exch Abdominal:     General: Bowel sounds are normal. There is no distension or abdominal bruit.     Palpations: Abdomen is soft. There is no mass.     Tenderness: There is no abdominal tenderness.     Hernia: No hernia is present.  Musculoskeletal:     Cervical back: Normal range of motion and neck supple.     Right lower leg: No edema.     Left lower leg: No edema.  Lymphadenopathy:     Cervical: No cervical adenopathy.  Skin:    General: Skin is warm and dry.     Coloration: Skin is not pale.     Findings: No erythema or rash.  Neurological:     Mental Status: She is alert. Mental status is at baseline.     Cranial Nerves: No cranial nerve deficit.     Coordination: Coordination normal.     Deep Tendon Reflexes: Reflexes are normal and  symmetric. Reflexes normal.  Psychiatric:        Mood and Affect: Mood normal.           Assessment & Plan:   Problem List Items Addressed This Visit      Cardiovascular and Mediastinum   Essential hypertension    bp in fair control at this time  BP Readings from Last 1 Encounters:  03/13/19 112/64   No changes needed Most recent labs reviewed  Disc lifstyle change with low sodium diet and exercise        Coronary atherosclerosis    Stable w/o angina  Lipids and bp are well controlled        Other   History of supraventricular tachycardia    Resolved completely years ago with an ablation  No re occurance      Elevated glucose    Lab Results  Component Value Date   HGBA1C 6.0 08/28/2018   Controlled with diet Not diabetic      Pre-operative examination - Primary    Prepping for bladder surgery / removal of bladder tumor with Dr Diamantina Providence planned for 1/22 No trouble with anesthesia in the past  Good bp control  Remote past h/o SVT (resolved)  No angina -last nuclear study was normal  EKG today is unchanged  Pt does not take nsaids or anticoagulants and never had a blood clot  Allergy to adhesive and latex discussed (skin only)  Also noted side eff of dizziness from tramadol  Expect she will do well with upcoming surgery  No restrictions  Other Visit Diagnoses    Pre-operative cardiovascular examination       Relevant Orders   EKG 12-Lead (Completed)

## 2019-03-13 NOTE — Assessment & Plan Note (Signed)
Prepping for bladder surgery / removal of bladder tumor with Dr Diamantina Providence planned for 1/22 No trouble with anesthesia in the past  Good bp control  Remote past h/o SVT (resolved)  No angina -last nuclear study was normal  EKG today is unchanged  Pt does not take nsaids or anticoagulants and never had a blood clot  Allergy to adhesive and latex discussed (skin only)  Also noted side eff of dizziness from tramadol  Expect she will do well with upcoming surgery  No restrictions

## 2019-03-13 NOTE — Assessment & Plan Note (Signed)
bp in fair control at this time  BP Readings from Last 1 Encounters:  03/13/19 112/64   No changes needed Most recent labs reviewed  Disc lifstyle change with low sodium diet and exercise

## 2019-03-14 ENCOUNTER — Other Ambulatory Visit: Payer: Self-pay

## 2019-03-14 ENCOUNTER — Encounter
Admission: RE | Admit: 2019-03-14 | Discharge: 2019-03-14 | Disposition: A | Discharge status: Home / Self Care | Payer: Medicare Other | Source: Ambulatory Visit | Attending: Urology | Admitting: Urology

## 2019-03-14 NOTE — Patient Instructions (Signed)
Your procedure is scheduled on: Friday 1/22 Report to Day Surgery.  [facility] To find out your arrival time please call [phone] between 1PM - 3PM on thurs 1/21.  Remember: Instructions that are not followed completely may result in serious medical risk,  up to and including death, or upon the discretion of your surgeon and anesthesiologist your  surgery may need to be rescheduled.     _X__ 1. Do not eat food after midnight the night before your procedure.                 No gum chewing or hard candies. You may drink clear liquids up to 2 hours                 before you are scheduled to arrive for your surgery- DO not drink clear                 liquids within 2 hours of the start of your surgery.                 Clear Liquids include:  water, apple juice without pulp, clear carbohydrate                 drink such as Clearfast of Gatorade, Black Coffee or Tea (Do not add                 anything to coffee or tea).  __X__2.  On the morning of surgery brush your teeth with toothpaste and water, you                may rinse your mouth with mouthwash if you wish.  Do not swallow any toothpaste of mouthwash.     ___ 3.  No Alcohol for 24 hours before or after surgery.   ___ 4.  Do Not Smoke or use e-cigarettes For 24 Hours Prior to Your Surgery.                 Do not use any chewable tobacco products for at least 6 hours prior to                 surgery.  ____  5.  Bring all medications with you on the day of surgery if instructed.   _x___  6.  Notify your doctor if there is any change in your medical condition      (cold, fever, infections).     Do not wear jewelry, make-up, hairpins, clips or nail polish. Do not wear lotions, powders, or perfumes. You may wear deodorant. Do not shave 48 hours prior to surgery. Men may shave face and neck. Do not bring valuables to the hospital.    [facility] is not responsible for any belongings or  valuables.  Contacts, dentures or bridgework may not be worn into surgery. Leave your suitcase in the car. After surgery it may be brought to your room. For patients admitted to the hospital, discharge time is determined by your treatment team.   Patients discharged the day of surgery will not be allowed to drive home.   Please read over the following fact sheets that you were given:   __x__ Take these medicines the morning of surgery with A SIP OF WATER:    1. acetaminophen (TYLENOL) 500 MG tablet if needed  2. brimonidine (ALPHAGAN) 0.2 % ophthalmic solution  3. fluticasone (FLONASE) 50 MCG/ACT nasal spray  4.omeprazole (PRILOSEC) 40 MG capsule  Night before surgery  and morning of surgery  5.oxybutynin (DITROPAN XL) 15 MG 24 hr tablet  6.prednisoLONE acetate (OMNIPRED) 1 % ophthalmic suspension             7.timolol (BETIMOL) 0.5 % ophthalmic solution  ____ Fleet Enema (as directed)   _x___ Shower the night before and the morning of surgery  ____ Use inhalers on the day of surgery  ____ Stop metformin 2 days prior to surgery    ____ Take 1/2 of usual insulin dose the night before surgery. No insulin the morning          of surgery.   ____ Stop Coumadin/Plavix/aspirin on   _x___ Stop Anti-inflammatories  No ibuprofen aleve of aspirin      May take tyelnol   ____ Stop supplements until after surgery.    ____ Bring C-Pap to the hospital.

## 2019-03-20 ENCOUNTER — Other Ambulatory Visit: Payer: Self-pay

## 2019-03-20 ENCOUNTER — Other Ambulatory Visit
Admission: RE | Admit: 2019-03-20 | Discharge: 2019-03-20 | Disposition: A | Discharge status: Home / Self Care | Payer: Medicare Other | Source: Ambulatory Visit | Attending: Urology | Admitting: Urology

## 2019-03-20 DIAGNOSIS — Z01812 Encounter for preprocedural laboratory examination: Secondary | ICD-10-CM | POA: Diagnosis not present

## 2019-03-20 DIAGNOSIS — Z20822 Contact with and (suspected) exposure to covid-19: Secondary | ICD-10-CM | POA: Diagnosis not present

## 2019-03-20 LAB — SARS CORONAVIRUS 2 (TAT 6-24 HRS): SARS Coronavirus 2: NEGATIVE

## 2019-03-20 LAB — BASIC METABOLIC PANEL
Anion gap: 8 (ref 5–15)
BUN: 10 mg/dL (ref 8–23)
CO2: 26 mmol/L (ref 22–32)
Calcium: 8.8 mg/dL — ABNORMAL LOW (ref 8.9–10.3)
Chloride: 101 mmol/L (ref 98–111)
Creatinine, Ser: 0.73 mg/dL (ref 0.44–1.00)
GFR calc Af Amer: >60 mL/min (ref >60)
GFR calc non Af Amer: >60 mL/min (ref >60)
Glucose, Bld: 101 mg/dL — ABNORMAL HIGH (ref 70–99)
Potassium: 4 mmol/L (ref 3.5–5.1)
Sodium: 135 mmol/L (ref 135–145)

## 2019-03-21 MED ORDER — CIPROFLOXACIN IN D5W 400 MG/200ML IV SOLN
400.0000 mg | INTRAVENOUS | Status: AC
  Administered 2019-03-22: 400 mg via INTRAVENOUS

## 2019-03-22 ENCOUNTER — Encounter: Payer: Self-pay | Admitting: Urology

## 2019-03-22 ENCOUNTER — Ambulatory Visit: Payer: Medicare Other

## 2019-03-22 ENCOUNTER — Encounter
Admission: RE | Disposition: A | Discharge status: Home / Self Care | Payer: Self-pay | Source: Home / Self Care | Attending: Urology

## 2019-03-22 ENCOUNTER — Ambulatory Visit
Admission: RE | Admit: 2019-03-22 | Discharge: 2019-03-22 | Disposition: A | Discharge status: Home / Self Care | Payer: Medicare Other | Attending: Urology | Admitting: Urology

## 2019-03-22 ENCOUNTER — Other Ambulatory Visit: Payer: Self-pay

## 2019-03-22 DIAGNOSIS — I10 Essential (primary) hypertension: Secondary | ICD-10-CM | POA: Diagnosis not present

## 2019-03-22 DIAGNOSIS — Z888 Allergy status to other drugs, medicaments and biological substances status: Secondary | ICD-10-CM | POA: Insufficient documentation

## 2019-03-22 DIAGNOSIS — C670 Malignant neoplasm of trigone of bladder: Secondary | ICD-10-CM | POA: Diagnosis not present

## 2019-03-22 DIAGNOSIS — I129 Hypertensive chronic kidney disease with stage 1 through stage 4 chronic kidney disease, or unspecified chronic kidney disease: Secondary | ICD-10-CM | POA: Diagnosis not present

## 2019-03-22 DIAGNOSIS — N189 Chronic kidney disease, unspecified: Secondary | ICD-10-CM | POA: Diagnosis not present

## 2019-03-22 DIAGNOSIS — E119 Type 2 diabetes mellitus without complications: Secondary | ICD-10-CM | POA: Diagnosis not present

## 2019-03-22 DIAGNOSIS — Z87891 Personal history of nicotine dependence: Secondary | ICD-10-CM | POA: Insufficient documentation

## 2019-03-22 DIAGNOSIS — C679 Malignant neoplasm of bladder, unspecified: Secondary | ICD-10-CM | POA: Diagnosis not present

## 2019-03-22 DIAGNOSIS — N3281 Overactive bladder: Secondary | ICD-10-CM | POA: Insufficient documentation

## 2019-03-22 DIAGNOSIS — E785 Hyperlipidemia, unspecified: Secondary | ICD-10-CM | POA: Diagnosis not present

## 2019-03-22 DIAGNOSIS — Z8249 Family history of ischemic heart disease and other diseases of the circulatory system: Secondary | ICD-10-CM | POA: Diagnosis not present

## 2019-03-22 DIAGNOSIS — Z886 Allergy status to analgesic agent status: Secondary | ICD-10-CM | POA: Diagnosis not present

## 2019-03-22 DIAGNOSIS — E1122 Type 2 diabetes mellitus with diabetic chronic kidney disease: Secondary | ICD-10-CM | POA: Insufficient documentation

## 2019-03-22 DIAGNOSIS — Z885 Allergy status to narcotic agent status: Secondary | ICD-10-CM | POA: Diagnosis not present

## 2019-03-22 DIAGNOSIS — I252 Old myocardial infarction: Secondary | ICD-10-CM | POA: Insufficient documentation

## 2019-03-22 DIAGNOSIS — I2510 Atherosclerotic heart disease of native coronary artery without angina pectoris: Secondary | ICD-10-CM | POA: Insufficient documentation

## 2019-03-22 DIAGNOSIS — D494 Neoplasm of unspecified behavior of bladder: Secondary | ICD-10-CM | POA: Diagnosis not present

## 2019-03-22 DIAGNOSIS — K219 Gastro-esophageal reflux disease without esophagitis: Secondary | ICD-10-CM | POA: Diagnosis not present

## 2019-03-22 HISTORY — PX: CYSTOSCOPY WITH FULGERATION: SHX6638

## 2019-03-22 HISTORY — PX: CYSTOSCOPY WITH BIOPSY: SHX5122

## 2019-03-22 LAB — GLUCOSE, CAPILLARY
Glucose-Capillary: 81 mg/dL (ref 70–99)
Glucose-Capillary: 112 mg/dL — ABNORMAL HIGH (ref 70–99)

## 2019-03-22 SURGERY — CYSTOSCOPY, WITH BIOPSY
Anesthesia: General

## 2019-03-22 MED ORDER — CIPROFLOXACIN IN D5W 400 MG/200ML IV SOLN
INTRAVENOUS | Status: AC
  Filled 2019-03-22: qty 200

## 2019-03-22 MED ORDER — SODIUM CHLORIDE 0.9 % IV SOLN: INTRAVENOUS | Status: DC

## 2019-03-22 MED ORDER — FENTANYL CITRATE (PF) 100 MCG/2ML IJ SOLN
INTRAMUSCULAR | Status: AC
  Filled 2019-03-22: qty 2

## 2019-03-22 MED ORDER — SUGAMMADEX SODIUM 200 MG/2ML IV SOLN
INTRAVENOUS | Status: DC | PRN
  Administered 2019-03-22: 200 mg via INTRAVENOUS

## 2019-03-22 MED ORDER — FENTANYL CITRATE (PF) 100 MCG/2ML IJ SOLN
INTRAMUSCULAR | Status: DC | PRN
  Administered 2019-03-22: 25 ug via INTRAVENOUS
  Administered 2019-03-22: 50 ug via INTRAVENOUS

## 2019-03-22 MED ORDER — ONDANSETRON HCL 4 MG/2ML IJ SOLN
INTRAMUSCULAR | Status: DC | PRN
  Administered 2019-03-22: 4 mg via INTRAVENOUS

## 2019-03-22 MED ORDER — PHENYLEPHRINE HCL (PRESSORS) 10 MG/ML IV SOLN
INTRAVENOUS | Status: DC | PRN
  Administered 2019-03-22 (×2): 100 ug via INTRAVENOUS

## 2019-03-22 MED ORDER — DEXAMETHASONE SODIUM PHOSPHATE 10 MG/ML IJ SOLN
INTRAMUSCULAR | Status: DC | PRN
  Administered 2019-03-22: 10 mg via INTRAVENOUS

## 2019-03-22 MED ORDER — SUGAMMADEX SODIUM 200 MG/2ML IV SOLN
INTRAVENOUS | Status: AC
  Filled 2019-03-22: qty 2

## 2019-03-22 MED ORDER — ROCURONIUM BROMIDE 100 MG/10ML IV SOLN
INTRAVENOUS | Status: DC | PRN
  Administered 2019-03-22: 30 mg via INTRAVENOUS

## 2019-03-22 MED ORDER — ESMOLOL HCL 100 MG/10ML IV SOLN
INTRAVENOUS | Status: DC | PRN
  Administered 2019-03-22 (×2): 20 ug via INTRAVENOUS

## 2019-03-22 MED ORDER — LIDOCAINE HCL (CARDIAC) PF 100 MG/5ML IV SOSY
PREFILLED_SYRINGE | INTRAVENOUS | Status: DC | PRN
  Administered 2019-03-22: 80 mg via INTRAVENOUS

## 2019-03-22 MED ORDER — GEMCITABINE CHEMO FOR BLADDER INSTILLATION 2000 MG
INTRAVENOUS | Status: DC | PRN
  Administered 2019-03-22: 2000 mg via INTRAVESICAL

## 2019-03-22 MED ORDER — FENTANYL CITRATE (PF) 100 MCG/2ML IJ SOLN: 25.0000 ug | INTRAMUSCULAR | Status: DC | PRN

## 2019-03-22 MED ORDER — PROPOFOL 10 MG/ML IV BOLUS
INTRAVENOUS | Status: DC | PRN
  Administered 2019-03-22: 20 mg via INTRAVENOUS
  Administered 2019-03-22: 80 mg via INTRAVENOUS

## 2019-03-22 MED ORDER — ESMOLOL HCL 100 MG/10ML IV SOLN
INTRAVENOUS | Status: AC
  Filled 2019-03-22: qty 10

## 2019-03-22 MED ORDER — GLYCOPYRROLATE 0.2 MG/ML IJ SOLN
INTRAMUSCULAR | Status: DC | PRN
  Administered 2019-03-22: .1 mg via INTRAVENOUS

## 2019-03-22 MED ORDER — LACTATED RINGERS IV SOLN: INTRAVENOUS | Status: DC | PRN

## 2019-03-22 MED ORDER — OXYCODONE HCL 5 MG PO TABS: 5.0000 mg | ORAL_TABLET | Freq: Once | ORAL | Status: DC | PRN

## 2019-03-22 MED ORDER — OXYCODONE HCL 5 MG/5ML PO SOLN: 5.0000 mg | Freq: Once | ORAL | Status: DC | PRN

## 2019-03-22 SURGICAL SUPPLY — 22 items
BAG DRAIN CYSTO-URO LG1000N (MISCELLANEOUS) ×2 IMPLANT
BRUSH SCRUB EZ  4% CHG (MISCELLANEOUS) ×1
BRUSH SCRUB EZ 4% CHG (MISCELLANEOUS) ×1 IMPLANT
DRSG TELFA 4X3 1S NADH ST (GAUZE/BANDAGES/DRESSINGS) ×2 IMPLANT
ELECT REM PT RETURN 9FT ADLT (ELECTROSURGICAL) ×2
ELECTRODE REM PT RTRN 9FT ADLT (ELECTROSURGICAL) ×1 IMPLANT
GLOVE BIOGEL PI IND STRL 7.5 (GLOVE) ×1 IMPLANT
GLOVE BIOGEL PI INDICATOR 7.5 (GLOVE) ×3
GOWN STRL REUS W/ TWL LRG LVL3 (GOWN DISPOSABLE) ×1 IMPLANT
GOWN STRL REUS W/ TWL XL LVL3 (GOWN DISPOSABLE) ×1 IMPLANT
GOWN STRL REUS W/TWL LRG LVL3 (GOWN DISPOSABLE) ×4
GOWN STRL REUS W/TWL XL LVL3 (GOWN DISPOSABLE) ×2
GUIDEWIRE STR DUAL SENSOR (WIRE) IMPLANT
KIT TURNOVER CYSTO (KITS) ×2 IMPLANT
PACK CYSTO AR (MISCELLANEOUS) ×2 IMPLANT
SET CYSTO W/LG BORE CLAMP LF (SET/KITS/TRAYS/PACK) ×2 IMPLANT
SOL .9 NS 3000ML IRR  AL (IV SOLUTION) ×1
SOL .9 NS 3000ML IRR AL (IV SOLUTION) ×1
SOL .9 NS 3000ML IRR UROMATIC (IV SOLUTION) ×1 IMPLANT
SURGILUBE 2OZ TUBE FLIPTOP (MISCELLANEOUS) ×2 IMPLANT
WATER STERILE IRR 1000ML POUR (IV SOLUTION) ×2 IMPLANT
WATER STERILE IRR 3000ML UROMA (IV SOLUTION) ×2 IMPLANT

## 2019-03-22 NOTE — Anesthesia Postprocedure Evaluation (Signed)
Anesthesia Post Note  Patient: FAREEHA OKULA  Procedure(s) Performed: CYSTOSCOPY WITH BLADDER BIOPSY WITH GEMCITABINE (N/A ) CYSTOSCOPY WITH FULGERATION (N/A )  Patient location during evaluation: PACU Anesthesia Type: General Level of consciousness: awake and alert Pain management: pain level controlled Vital Signs Assessment: post-procedure vital signs reviewed and stable Respiratory status: spontaneous breathing, nonlabored ventilation, respiratory function stable and patient connected to nasal cannula oxygen Cardiovascular status: blood pressure returned to baseline and stable Postop Assessment: no apparent nausea or vomiting Anesthetic complications: no     Last Vitals:  Vitals:   03/22/19 1345 03/22/19 1404  BP: (!) 142/81 (!) 148/95  Pulse: 92 88  Resp: 14 16  Temp:  36.8 C  SpO2: 99% 100%    Last Pain:  Vitals:   03/22/19 1345  TempSrc:   PainSc: 0-No pain                 Precious Haws Maeby Vankleeck

## 2019-03-22 NOTE — H&P (Signed)
03/22/19 12:11 PM   Victoria Allison May 12, 1940 [id number]   HPI: Victoria Allison is a 79o yo F recently found to have a small 1cm papillary lesion on clinic cystoscopy. She presents today for biopsy and fulguration. She denies any CP, SOB, or dysuria.   PMH: Past Medical History:  Diagnosis Date  . Allergic rhinitis   . CAD (coronary artery disease)   . Cancer ([place])    skin  . Chronic kidney disease    overactive bladder  . Colon polyp   . Diverticulosis    sigmoid and descending colon  . DM (diabetes mellitus) ([place])    no longer take any diabetic meds now that off Prednisone, type 2 diet controlled  . Elevated LFTs over one year ago  . Esophageal ring   . GERD (gastroesophageal reflux disease)   . History of hepatitis 1975   unsure of type  . Hyperlipemia   . Hypertension   . Internal hemorrhoids   . Lichen sclerosus 123456   (DX BY GYN AFTER ABN PAP)  . Pemphigoid, cicatricial    D/O AFFECTING EYES, MOUTH, THROAT NOW (DX [facility])  . Shingles 03/13/2013  . Urinary incontinence    OVERACTIVE BLADDER    Surgical History: Past Surgical History:  Procedure Laterality Date  . ABDOMINAL HYSTERECTOMY    . ANGIOPLASTY  yrs ago   LAD  . CARDIAC ELECTROPHYSIOLOGY MAPPING AND ABLATION    . CATARACT EXTRACTION Bilateral   . COLONOSCOPY    . ESOPHAGOGASTRODUODENOSCOPY  09/14/2011   Procedure: ESOPHAGOGASTRODUODENOSCOPY (EGD);  Surgeon: Gatha Mayer, MD;  Location: [facility];  Service: Endoscopy;  Laterality: N/A;  needs xray  . ESOPHAGOGASTRODUODENOSCOPY N/A 01/02/2013   Procedure: ESOPHAGOGASTRODUODENOSCOPY (EGD);  Surgeon: Gatha Mayer, MD;  Location: [facility];  Service: Endoscopy;  Laterality: N/A;  . ESOPHAGOGASTRODUODENOSCOPY N/A 04/12/2013   Procedure: ESOPHAGOGASTRODUODENOSCOPY (EGD);  Surgeon: Gatha Mayer, MD;  Location: [facility];  Service: Endoscopy;  Laterality: N/A;  . ESOPHAGOGASTRODUODENOSCOPY (EGD) WITH PROPOFOL N/A 05/24/2016   Procedure:  ESOPHAGOGASTRODUODENOSCOPY (EGD) WITH PROPOFOL;  Surgeon: Gatha Mayer, MD;  Location: [facility];  Service: Endoscopy;  Laterality: N/A;  . ESOPHAGOGASTRODUODENOSCOPY (EGD) WITH PROPOFOL N/A 08/22/2017   Procedure: ESOPHAGOGASTRODUODENOSCOPY (EGD) WITH PROPOFOL;  Surgeon: Gatha Mayer, MD;  Location: [facility];  Service: Endoscopy;  Laterality: N/A;  . ESOPHAGOGASTRODUODENOSCOPY (EGD) WITH PROPOFOL N/A 02/26/2019   Procedure: ESOPHAGOGASTRODUODENOSCOPY (EGD) WITH PROPOFOL;  Surgeon: Gatha Mayer, MD;  Location: [facility];  Service: Endoscopy;  Laterality: N/A;  . Venia Minks DILATION N/A 01/02/2013   Procedure: Venia Minks DILATION;  Surgeon: Gatha Mayer, MD;  Location: [facility];  Service: Endoscopy;  Laterality: N/A;  . Venia Minks DILATION N/A 05/24/2016   Procedure: Venia Minks DILATION;  Surgeon: Gatha Mayer, MD;  Location: [facility];  Service: Endoscopy;  Laterality: N/A;  . Venia Minks DILATION N/A 08/22/2017   Procedure: Venia Minks DILATION;  Surgeon: Gatha Mayer, MD;  Location: [facility];  Service: Endoscopy;  Laterality: N/A;  Venia Minks DILATION  02/26/2019   Procedure: Venia Minks DILATION;  Surgeon: Gatha Mayer, MD;  Location: [facility];  Service: Endoscopy;;  . NOSE SURGERY     Biopsy  . OOPHORECTOMY    . SAVORY DILATION  09/14/2011   Procedure: SAVORY DILATION;  Surgeon: Gatha Mayer, MD;  Location: [facility];  Service: Endoscopy;  Laterality: N/A;  . SAVORY DILATION N/A 01/02/2013   Procedure: SAVORY DILATION;  Surgeon: Gatha Mayer, MD;  [facility];  Service:  Endoscopy;  Laterality: N/A;  . SAVORY DILATION N/A 04/12/2013   Procedure: SAVORY DILATION;  Surgeon: Gatha Mayer, MD;  Location: [facility];  Service: Endoscopy;  Laterality: N/A;  . skin cancer removal  02/2016    Allergies:  Allergies  Allergen Reactions  . Adhesive [Tape] Rash    Paper tape and tegaderm OK  . Aspirin Other (See Comments)    Bleeds too easily  . Latex Itching  and Rash  . Lipitor [Atorvastatin] Other (See Comments)    Elevated LFT's  . Tetanus Toxoid Rash  . Ultram [Tramadol] Other (See Comments)    Dizziness    Family History: Family History  Problem Relation Age of Onset  . Lung cancer Father   . Esophageal cancer Son 10  . Stomach cancer Son 42  . Stroke Mother        CVA  . Alzheimer's disease Mother   . Hypertension Brother   . Breast cancer Maternal Aunt   . Colon cancer Neg Hx   . Rectal cancer Neg Hx     Social History:  reports that she quit smoking about 51 years ago. She has never used smokeless tobacco. She reports that she does not drink alcohol or use drugs.  ROS: Please see flowsheet from today's date for complete review of systems.  Physical Exam: BP 133/75   Pulse 72   Temp 97.6 F (36.4 C) (Tympanic)   Resp 17   Ht 5\' 1"  (1.549 m)   Wt 50.2 kg   LMP 02/29/1968   SpO2 97% Comment: Difficult to obtain  BMI 20.91 kg/m    Constitutional:  Alert and oriented, No acute distress. Cardiovascular: RRR Respiratory: CTA bilaterally GI: Abdomen is soft, nontender, nondistended, no abdominal masses Lymph: No cervical or inguinal lymphadenopathy. Skin: No rashes, bruises or suspicious lesions. Neurologic: Grossly intact, no focal deficits, moving all 4 extremities. Psychiatric: Normal mood and affect.  Laboratory Data: Urine culture 1/7 10-25k mixed flora  Assessment & Plan:   79 yo F with small 1cm papillary bladder lesion on cysto worrisome for bladder cancer.  We discussed transurethral resection of bladder tumor (TURBT) and risks and benefits at length. This is typically a 1 to 2-hour procedure done under general anesthesia in the operating room.  A scope is inserted through the urethra and used to resect abnormal tissue within the bladder, which is then sent to the pathologist to determine grade and stage of the tumor.  Risks include bleeding, infection, need for temporary Foley placement, and bladder  perforation.  Treatment strategies are based on the type of tumor and depth of invasion.  We briefly reviewed the different treatment pathways for non-muscle invasive and muscle invasive bladder cancer.  Cystoscopy and bladder biopsy, gemcitabine  Billey Co, MD  [facility] [address], [place], [place] [address] [phone]

## 2019-03-22 NOTE — Anesthesia Preprocedure Evaluation (Addendum)
Anesthesia Evaluation  Patient identified by MRN, date of birth, ID band Patient awake    Reviewed: Allergy & Precautions, H&P , NPO status , Patient's Chart, lab work & pertinent test results  History of Anesthesia Complications Negative for: history of anesthetic complications  Airway Mallampati: II  TM Distance: <3 FB Neck ROM: limited    Dental  (+) Chipped, Implants, Missing, Poor Dentition   Pulmonary neg pulmonary ROS, neg shortness of breath, former smoker,           Cardiovascular Exercise Tolerance: Good hypertension, (-) angina+ CAD  (-) Past MI      Neuro/Psych negative neurological ROS  negative psych ROS   GI/Hepatic Neg liver ROS, GERD  Medicated and Controlled,  Endo/Other  diabetes, Type 2  Renal/GU Renal disease     Musculoskeletal   Abdominal   Peds  Hematology negative hematology ROS (+)   Anesthesia Other Findings Past Medical History: No date: Allergic rhinitis No date: CAD (coronary artery disease) No date: Cancer ([place])     Comment:  skin No date: Chronic kidney disease     Comment:  overactive bladder No date: Colon polyp No date: Diverticulosis     Comment:  sigmoid and descending colon No date: DM (diabetes mellitus) ([place])     Comment:  no longer take any diabetic meds now that off               Prednisone, type 2 diet controlled over one year ago: Elevated LFTs No date: Esophageal ring No date: GERD (gastroesophageal reflux disease) 1975: History of hepatitis     Comment:  unsure of type No date: Hyperlipemia No date: Hypertension No date: Internal hemorrhoids 123456: Lichen sclerosus     Comment:  (DX BY GYN AFTER ABN PAP) No date: Pemphigoid, cicatricial     Comment:  D/O AFFECTING EYES, MOUTH, THROAT NOW (DX [facility]) 03/13/2013: Shingles No date: Urinary incontinence     Comment:  OVERACTIVE BLADDER  Past Surgical History: No date: ABDOMINAL HYSTERECTOMY yrs ago:  ANGIOPLASTY     Comment:  LAD No date: CARDIAC ELECTROPHYSIOLOGY MAPPING AND ABLATION No date: CATARACT EXTRACTION; Bilateral No date: COLONOSCOPY 09/14/2011: ESOPHAGOGASTRODUODENOSCOPY     Comment:  Procedure: ESOPHAGOGASTRODUODENOSCOPY (EGD);  Surgeon:               Gatha Mayer, MD;  Location: [facility];  Service:               Endoscopy;  Laterality: N/A;  needs xray 01/02/2013: ESOPHAGOGASTRODUODENOSCOPY; N/A     Comment:  Procedure: ESOPHAGOGASTRODUODENOSCOPY (EGD);  Surgeon:               Gatha Mayer, MD;  Location: [facility];  Service:               Endoscopy;  Laterality: N/A; 04/12/2013: ESOPHAGOGASTRODUODENOSCOPY; N/A     Comment:  Procedure: ESOPHAGOGASTRODUODENOSCOPY (EGD);  Surgeon:               Gatha Mayer, MD;  Location: [facility];  Service:               Endoscopy;  Laterality: N/A; 05/24/2016: ESOPHAGOGASTRODUODENOSCOPY (EGD) WITH PROPOFOL; N/A     Comment:  Procedure: ESOPHAGOGASTRODUODENOSCOPY (EGD) WITH               PROPOFOL;  Surgeon: Gatha Mayer, MD;  [facility];  Service: Endoscopy;  Laterality: N/A; 08/22/2017: ESOPHAGOGASTRODUODENOSCOPY (EGD) WITH PROPOFOL; N/A     Comment:  Procedure: ESOPHAGOGASTRODUODENOSCOPY (EGD) WITH               PROPOFOL;  Surgeon: Gatha Mayer, MD;  [facility]: [facility];  Service: Endoscopy;  Laterality: N/A; 02/26/2019: ESOPHAGOGASTRODUODENOSCOPY (EGD) WITH PROPOFOL; N/A     Comment:  Procedure: ESOPHAGOGASTRODUODENOSCOPY (EGD) WITH               PROPOFOL;  Surgeon: Gatha Mayer, MD;  Location: [facility]               [facility];  Service: Endoscopy;  Laterality: N/A; 01/02/2013: MALONEY DILATION; N/A     Comment:  Procedure: Keturah Shavers;  Surgeon: Gatha Mayer,               MD;  Location: [facility];  Service: Endoscopy;                Laterality: N/A; 05/24/2016: MALONEY DILATION; N/A     Comment:  Procedure: Keturah Shavers;  Surgeon: Gatha Mayer,                MD;  Location: [facility];  Service: Endoscopy;                Laterality: N/A; 08/22/2017: Venia Minks DILATION; N/A     Comment:  Procedure: Keturah Shavers;  Surgeon: Gatha Mayer,               MD;  Location: [facility];  Service: Endoscopy;                Laterality: N/A; 02/26/2019: MALONEY DILATION     Comment:  Procedure: MALONEY DILATION;  Surgeon: Gatha Mayer,               MD;  Location: [facility];  Service: Endoscopy;; No date: NOSE SURGERY     Comment:  Biopsy No date: OOPHORECTOMY 09/14/2011: SAVORY DILATION     Comment:  Procedure: SAVORY DILATION;  Surgeon: Gatha Mayer,               MD;  Location: [facility];  Service: Endoscopy;                Laterality: N/A; 01/02/2013: SAVORY DILATION; N/A     Comment:  Procedure: SAVORY DILATION;  Surgeon: Gatha Mayer,               MD;  Location: [facility];  Service: Endoscopy;                Laterality: N/A; 04/12/2013: SAVORY DILATION; N/A     Comment:  Procedure: SAVORY DILATION;  Surgeon: Gatha Mayer,               MD;  Location: [facility];  Service: Endoscopy;                Laterality: N/A; 02/2016: skin cancer removal  BMI    Body Mass Index: 20.91 kg/m      Reproductive/Obstetrics negative OB ROS                             Anesthesia Physical Anesthesia Plan  ASA: III  Anesthesia Plan: General ETT   Post-op Pain Management:    Induction: Intravenous  PONV Risk Score and Plan: Dexamethasone, Ondansetron,  Midazolam and Treatment may vary due to age or medical condition  Airway Management Planned: Oral ETT  Additional Equipment:   Intra-op Plan:   Post-operative Plan: Extubation in OR  Informed Consent: I have reviewed the patients History and Physical, chart, labs and discussed the procedure including the risks, benefits and alternatives for the proposed anesthesia with the patient or authorized representative who has indicated his/her understanding  and acceptance.     Dental Advisory Given  Plan Discussed with: Anesthesiologist, CRNA and Surgeon  Anesthesia Plan Comments: (Patient consented for risks of anesthesia including but not limited to:  - adverse reactions to medications - damage to teeth, lips or other oral mucosa - sore throat or hoarseness - Damage to heart, brain, lungs or loss of life  Patient voiced understanding.)       Anesthesia Quick Evaluation

## 2019-03-22 NOTE — Anesthesia Procedure Notes (Addendum)
Procedure Name: Intubation Date/Time: 03/22/2019 12:40 PM Performed by: Kelton Pillar, CRNA Pre-anesthesia Checklist: Patient identified, Emergency Drugs available, Suction available and Patient being monitored Patient Re-evaluated:Patient Re-evaluated prior to induction Oxygen Delivery Method: Circle system utilized Preoxygenation: Pre-oxygenation with 100% oxygen Induction Type: IV induction Ventilation: Mask ventilation without difficulty Laryngoscope Size: McGraph and 3 Grade View: Grade I Tube type: Oral Tube size: 6.5 mm Number of attempts: 1 Airway Equipment and Method: Stylet Placement Confirmation: ETT inserted through vocal cords under direct vision,  positive ETCO2,  CO2 detector and breath sounds checked- equal and bilateral Secured at: 19 cm Tube secured with: Tape Dental Injury: Teeth and Oropharynx as per pre-operative assessment

## 2019-03-22 NOTE — Transfer of Care (Signed)
Immediate Anesthesia Transfer of Care Note  Patient: Victoria Allison  Procedure(s) Performed: CYSTOSCOPY WITH BLADDER BIOPSY WITH GEMCITABINE (N/A ) CYSTOSCOPY WITH FULGERATION (N/A )  Patient Location: PACU  Anesthesia Type:General  Level of Consciousness: drowsy  Airway & Oxygen Therapy: Patient Spontanous Breathing and Patient connected to face mask oxygen  Post-op Assessment: Report given to RN and Post -op Vital signs reviewed and stable  Post vital signs: Reviewed and stable  Last Vitals:  Vitals Value Taken Time  BP 141/106 03/22/19 1314  Temp 36.5 C 03/22/19 1314  Pulse 101 03/22/19 1320  Resp 17 03/22/19 1320  SpO2 99 % 03/22/19 1320  Vitals shown include unvalidated device data.  Last Pain:  Vitals:   03/22/19 1314  TempSrc:   PainSc: 0-No pain         Complications: No apparent anesthesia complications

## 2019-03-22 NOTE — Discharge Instructions (Signed)

## 2019-03-22 NOTE — Op Note (Signed)
Date of procedure: 03/22/19  Preoperative diagnosis:  1. Bladder tumor  Postoperative diagnosis:  1. Same  Procedure: 1. Cystoscopy, bladder biopsy and fulguration 2. Instillation of gemcitabine  Surgeon: Nickolas Madrid, MD  Anesthesia: General  Complications: None  Intraoperative findings:  1.  Very small 5 mm papillary tumor just medial to the right ureteral orifice at the trigone, biopsied and removed and base fulgurated.  No other bladder lesions  EBL: Minimal  Specimens: Bladder tumor  Drains: 16 French Foley  Indication: Victoria Allison is a 79 y.o. patient with persistent urinary symptoms who was recently found to have a small 5 mm papillary bladder tumor on clinic cystoscopy.  After reviewing the management options for treatment, they elected to proceed with the above surgical procedure(s). We have discussed the potential benefits and risks of the procedure, side effects of the proposed treatment, the likelihood of the patient achieving the goals of the procedure, and any potential problems that might occur during the procedure or recuperation. Informed consent has been obtained.  Description of procedure:  The patient was taken to the operating room and general anesthesia was induced. SCDs were placed for DVT prophylaxis. The patient was placed in the dorsal lithotomy position, prepped and draped in the usual sterile fashion, and preoperative antibiotics(Ancef) were administered. A preoperative time-out was performed.   A 21 French rigid cystoscope was used to intubate the urethra.  Notably, the vagina and urethra were significantly atrophic.  Thorough cystoscopy showed that the ureteral orifices were orthotopic bilaterally, and there was a small 5 mm papillary appearing tumor just medial to the right ureteral orifice.  There were no other bladder lesions seen.  A cold cup biopsy forceps were used to delicately remove the small tumor that was medial to the ureteral orifice.   A Bugbee was carefully used for spot cautery at the base of the lesion.  At the conclusion of the case there was excellent hemostasis with the bladder decompressed, and the ureteral orifices were intact.  A 16 French Foley catheter was placed into the bladder.  2 g / 40 mL gemcitabine was instilled into the bladder, and the catheter was clamped.  Disposition: Stable to PACU  Plan: Unclamp Foley at 2 PM and allow gemcitabine to drain, then remove Foley prior to discharge Follow-up to discuss pathology   Nickolas Madrid, MD

## 2019-03-25 LAB — SURGICAL PATHOLOGY

## 2019-03-26 ENCOUNTER — Ambulatory Visit: Payer: Self-pay

## 2019-04-02 ENCOUNTER — Telehealth (INDEPENDENT_AMBULATORY_CARE_PROVIDER_SITE_OTHER): Payer: Medicare Other | Admitting: Urology

## 2019-04-02 ENCOUNTER — Telehealth: Payer: Self-pay

## 2019-04-02 ENCOUNTER — Other Ambulatory Visit: Payer: Self-pay

## 2019-04-02 DIAGNOSIS — C679 Malignant neoplasm of bladder, unspecified: Secondary | ICD-10-CM

## 2019-04-02 NOTE — Progress Notes (Signed)
Virtual Visit via Telephone Note  I connected with Victoria Allison on 04/02/19 at 11:30 AM EST by telephone and verified that I am speaking with the correct person using two identifiers.   I discussed the limitations, risks, security and privacy concerns of performing an evaluation and management service by telephone and the availability of in person appointments. We discussed the impact of the COVID-19 pandemic on the healthcare system, and the importance of social distancing and reducing patient and provider exposure. I also discussed with the patient that there may be a patient responsible charge related to this service. The patient expressed understanding and agreed to proceed.  Reason for visit: Discuss bladder biopsy results  History of Present Illness: I had phone follow-up with [name] to discuss her bladder biopsy results.  Briefly, she is an 79 year old female who was found to have a small 5 mm papillary bladder tumor on clinic cystoscopy with Dr. Matilde Sprang for work-up of incontinence, and underwent biopsy and fulguration on 03/22/2019.  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  She has some intermittent dysuria since surgery, but denies any gross hematuria or other complaints.  We reviewed her new diagnosis of bladder cancer at length.  I recommended completing staging with CT urogram and chest x-ray, and pursuing induction BCG.  I do not think she would benefit from a second look TURBT with her very small lesion, complete resection, and her age and comorbidities.  Follow Up: Will call with CT urogram and chest x-ray results Induction BCG x6, followed by clinic cystoscopy in 3 months    I discussed the assessment and treatment plan with the patient. The patient was provided an opportunity to ask questions and all were answered. The patient agreed with the plan and demonstrated an understanding of the instructions.   The patient was advised to call back  or seek an in-person evaluation if the symptoms worsen or if the condition fails to improve as anticipated.  I provided 15 minutes of non-face-to-face time during this encounter.   Billey Co, MD

## 2019-04-02 NOTE — Telephone Encounter (Signed)
Spoke with patient's daughter and BCG was explained in detail and BCG treatments were scheduled. Patient's daughter is concerned that patient may have leakage and trouble holding BCG for two hours. If this is a problem we can place a foley and plug and patient can remove at home

## 2019-04-02 NOTE — Telephone Encounter (Signed)
-----   Message from Billey Co, MD sent at 04/02/2019 12:06 PM EST ----- Regarding: BCG and follow up Victoria Allison-please schedule induction BCG x6 to start end of February.  I also ordered CT and chest x-ray for staging, and will call her with these results.  Olivia Mackie, please schedule clinic cystoscopy with me 3 months after finishing BCG.  She also will need an appointment in about 2 months with Dr. Matilde Sprang to discuss her incontinence.  Nickolas Madrid, MD 04/02/2019

## 2019-04-08 ENCOUNTER — Telehealth: Payer: Self-pay | Admitting: Urology

## 2019-04-08 NOTE — Telephone Encounter (Signed)
Let's do a virtual this week  Thanks [employer]

## 2019-04-08 NOTE — Telephone Encounter (Signed)
Would you like me to schedule a virtual for you to discuss with patient? Please advise

## 2019-04-08 NOTE — Telephone Encounter (Signed)
Pt wanted to cancel her BCG appts due to a condition she has called Pemphigoid.  Patient states she has no immune system and her bones are disintegrating.  She doesn't think the treatment will work, so she wanted to cancel all BCG treatments.

## 2019-04-08 NOTE — Telephone Encounter (Signed)
I spoke w/pt.  She said her daughter works all day, everyday at [employer] and pt is hard of hearing.  She said she doesn't mind a virtual, but you're not going to change her mind.

## 2019-04-08 NOTE — Telephone Encounter (Signed)
Please schedule a virtual with this pt and her daughter if possible,ok to double book. Thanks

## 2019-04-17 ENCOUNTER — Other Ambulatory Visit: Payer: Self-pay

## 2019-04-17 ENCOUNTER — Ambulatory Visit
Admission: RE | Admit: 2019-04-17 | Discharge: 2019-04-17 | Disposition: A | Discharge status: Home / Self Care | Payer: Medicare Other | Source: Ambulatory Visit | Attending: Urology | Admitting: Urology

## 2019-04-17 DIAGNOSIS — C679 Malignant neoplasm of bladder, unspecified: Secondary | ICD-10-CM

## 2019-04-17 MED ORDER — IOHEXOL 300 MG/ML  SOLN
100.0000 mL | Freq: Once | INTRAMUSCULAR | Status: AC | PRN
  Administered 2019-04-17: 10:00:00 100 mL via INTRAVENOUS

## 2019-04-19 NOTE — Telephone Encounter (Signed)
-----   Message from Billey Co, MD sent at 04/18/2019  3:29 PM EST ----- No evidence of bladder cancer spread on CT and CXR, follow up 3 months for cysto as she refused BCG. Thanks Nickolas Madrid, MD 04/18/2019

## 2019-04-27 ENCOUNTER — Ambulatory Visit: Payer: Medicare Other | Attending: Internal Medicine

## 2019-04-27 DIAGNOSIS — Z23 Encounter for immunization: Secondary | ICD-10-CM

## 2019-04-27 NOTE — Progress Notes (Signed)
   Covid-19 Vaccination Clinic  Name:  Victoria Allison    MRN: [id number] DOB: 03-Jun-1940  04/27/2019  [name] was observed post Covid-19 immunization for 15 minutes without incidence. She was provided with Vaccine Information Sheet and instruction to access the V-Safe system.   [name] was instructed to call 911 with any severe reactions post vaccine: Marland Kitchen Difficulty breathing  . Swelling of your face and throat  . A fast heartbeat  . A bad rash all over your body  . Dizziness and weakness    Immunizations Administered    Name Date Dose VIS Date Route   Moderna COVID-19 Vaccine 04/27/2019 11:48 AM 0.5 mL 01/29/2019 Intramuscular   Manufacturer: Moderna   Lot: [id number]   [place]:[id number]

## 2019-04-29 ENCOUNTER — Other Ambulatory Visit: Payer: Self-pay | Admitting: Family Medicine

## 2019-04-30 ENCOUNTER — Ambulatory Visit: Payer: Self-pay

## 2019-05-03 ENCOUNTER — Ambulatory Visit: Payer: Self-pay | Admitting: Physician Assistant

## 2019-05-10 ENCOUNTER — Ambulatory Visit: Payer: Self-pay | Admitting: Physician Assistant

## 2019-05-16 ENCOUNTER — Telehealth: Payer: Self-pay | Admitting: Urology

## 2019-05-16 ENCOUNTER — Telehealth (INDEPENDENT_AMBULATORY_CARE_PROVIDER_SITE_OTHER): Payer: Medicare Other | Admitting: Urology

## 2019-05-16 ENCOUNTER — Other Ambulatory Visit: Payer: Self-pay

## 2019-05-16 DIAGNOSIS — C670 Malignant neoplasm of trigone of bladder: Secondary | ICD-10-CM | POA: Diagnosis not present

## 2019-05-16 DIAGNOSIS — N3281 Overactive bladder: Secondary | ICD-10-CM

## 2019-05-16 NOTE — Telephone Encounter (Signed)
done

## 2019-05-16 NOTE — Progress Notes (Signed)
Virtual Visit via Telephone Note  I connected with Victoria Allison and Victoria Allison on 05/16/19 at  8:30 AM EDT by telephone and verified that I am speaking with the correct person using two identifiers.   I discussed the limitations, risks, security and privacy concerns of performing an evaluation and management service by telephone and the availability of in person appointments. We discussed the impact of the COVID-19 pandemic on the healthcare system, and the importance of social distancing and reducing patient and provider exposure. I also discussed with the patient that there may be a patient responsible charge related to this service. The patient expressed understanding and agreed to proceed.  Reason for visit: Bladder cancer, OAB  History of Present Illness: I had phone follow-up with Victoria Allison and Victoria Allison today.  She is an 79 year old female with overactive bladder and urge incontinence that was previously followed by Dr. Matilde Sprang, and on cystoscopy was found to have a small papillary bladder tumor.  She underwent cystoscopy, cold cup biopsy and removal, and fulguration of a small 5 mm papillary tumor at the trigone with me on 03/22/2019, with pathology showing noninvasive high-grade papillary urothelial cell carcinoma.  Gemcitabine was given postoperatively.  I had recommended intravesical BCG, however with Victoria comorbidities and frailty she refused further intravesical therapies.  She continues to be bothered by urge incontinence, and is on oxybutynin from Victoria primary care physician.  We discussed primarily Victoria bladder cancer today.  We discussed the high risk of recurrence with high-grade disease, however the fact that she had a very small tumor is certainly reassuring.  We jointly agreed to perform surveillance cystoscopy moving forward, she is adamantly opposed to intravesical treatments.  We will plan for first follow-up cystoscopy at 4-5 months instead of 3 months with  Victoria co-morbidities.  Regarding Victoria urge incontinence and mild dysuria, I recommended a trial of Myrbetriq instead of oxybutynin as she is high risk for confusion and falls with an anticholinergic.  I also recommended trialing Premarin cream for Victoria mild dysuria and urethral discomfort.  Victoria Allison will pick up the samples from clinic today.  I also set up follow-up with Dr. Matilde Sprang in 3 to 4 weeks for symptom check regarding Victoria OAB symptoms and consideration of any other options.  -RTC with Dr. Matilde Sprang in 3 to 4 weeks for OAB/urge incontinence symptom check after starting Myrbetriq and Premarin cream -Cystoscopy with me in May/June 2021 for first bladder cancer surveillance cystoscopy    I discussed the assessment and treatment plan with the patient. The patient was provided an opportunity to ask questions and all were answered. The patient agreed with the plan and demonstrated an understanding of the instructions.   The patient was advised to call back or seek an in-person evaluation if the symptoms worsen or if the condition fails to improve as anticipated.  I provided 15 minutes of non-face-to-face time during this encounter.   Billey Co, MD

## 2019-05-17 ENCOUNTER — Ambulatory Visit: Payer: Self-pay | Admitting: Physician Assistant

## 2019-05-24 ENCOUNTER — Ambulatory Visit: Payer: Self-pay | Admitting: Physician Assistant

## 2019-05-25 ENCOUNTER — Ambulatory Visit: Payer: Medicare Other

## 2019-05-28 ENCOUNTER — Ambulatory Visit: Payer: Medicare Other

## 2019-05-31 ENCOUNTER — Ambulatory Visit: Payer: Self-pay | Admitting: Physician Assistant

## 2019-06-01 ENCOUNTER — Ambulatory Visit: Payer: Medicare Other

## 2019-06-04 ENCOUNTER — Ambulatory Visit: Payer: Medicare Other

## 2019-06-07 ENCOUNTER — Ambulatory Visit: Payer: Self-pay | Admitting: Physician Assistant

## 2019-06-10 ENCOUNTER — Other Ambulatory Visit: Payer: Self-pay

## 2019-06-10 ENCOUNTER — Encounter: Payer: Self-pay | Admitting: Urology

## 2019-06-10 ENCOUNTER — Ambulatory Visit (INDEPENDENT_AMBULATORY_CARE_PROVIDER_SITE_OTHER): Payer: Medicare Other | Admitting: Urology

## 2019-06-10 VITALS — BP 179/67 | HR 76 | Ht 61.0 in | Wt 107.0 lb

## 2019-06-10 DIAGNOSIS — R32 Unspecified urinary incontinence: Secondary | ICD-10-CM | POA: Diagnosis not present

## 2019-06-10 DIAGNOSIS — I2510 Atherosclerotic heart disease of native coronary artery without angina pectoris: Secondary | ICD-10-CM | POA: Diagnosis not present

## 2019-06-10 DIAGNOSIS — N3946 Mixed incontinence: Secondary | ICD-10-CM

## 2019-06-10 LAB — MICROSCOPIC EXAMINATION: RBC, Urine: NONE SEEN /hpf (ref 0–2)

## 2019-06-10 LAB — URINALYSIS, COMPLETE
Bilirubin, UA: NEGATIVE
Glucose, UA: NEGATIVE
Ketones, UA: NEGATIVE
Nitrite, UA: NEGATIVE
Protein,UA: NEGATIVE
RBC, UA: NEGATIVE
Specific Gravity, UA: 1.015 (ref 1.005–1.030)
Urobilinogen, Ur: 1 mg/dL (ref 0.2–1.0)
pH, UA: 7 (ref 5.0–7.5)

## 2019-06-10 MED ORDER — SOLIFENACIN SUCCINATE 5 MG PO TABS: 5.0000 mg | ORAL_TABLET | Freq: Every day | ORAL | 11 refills | Status: DC

## 2019-06-10 NOTE — Progress Notes (Signed)
06/10/2019 8:25 AM   Victoria Allison [birth year] [id number]  Referring provider: Abner Greenspan, MD [address] [place],  [place] [address]  Chief Complaint  Patient presents with  . Over Active Bladder    HPI: The patient in the last month has vaginal discomfort when she voids but is relieved by voiding but not on every occasion. Some of the details of the history were difficult. Last week she had low back pain radiating to her left leg and the settled down. She does not have cloudy or foul-smelling urine but I believe she has got worsening bedwetting and worsening frequency associated  It was more challenging to baseline her symptoms. She was having urge incontinence wearing 2 pads during the day and 1 small pad at night. She said the leakage was mild.  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 Some atrophic changes  Patient appears to have chronic urinary incontinence with urge incontinence and milder bedwetting. She appears to do well on oxybutynin probably has a bladder infection.   She does not need cystoscopy if this is a bladder infection that improves symptoms by treatment. Vaginal dryness is another potential target but may be challenging with a narrow introitus  Last urine culture positive.  The patient is having less discomfort but again she is nonspecific.  No more back pain since the Macrodantin.  I think she still has some urge incontinence especially in the middle of the night  Cystoscopy: On cystoscopy initially I thought the bladder mucosa was completely normal.  It was a little bit erythematous along the right lateral wall and there appeared to be a few millimeters of elevation or lesions that may represent mild papillary superficial carcinoma.  She smoked in the 1970s.  She also had significant  pseudomembranous trigonitis next to it.  I tried to use the flow to see if it would move the initial tensional papillary lesions but they did not  The findings were mildly abnormal.  I want to put her on trimethoprim suppression therapy to see if this prevents bladder infections and even down regulate some of her urgency incontinence.  For now I will keep her on the oxybutynin but other treatments can be offered.  Like to have one of my partners recystoscoped her on prophylaxis in 2 months.  It will be interesting to see if the lesions are still present and if they were a false positive from recent positive urine culture.  She is 79 years of age and may or may not benefit from a bladder biopsy but I would like a second opinion regarding  Today Patient had bladder biopsy with high-grade papillary urothelial carcinoma but patient refused BCG.  It was a very small tumor approximately 5 mm in size.  She was to be followed every 4 to 5 months instead of 3 months because of comorbidities.  She was prescribed Myrbetriq and oxybutynin discontinued.  She was recommended Premarin cream for mild burning.  Myrbetriq helps 20%.  Premarin cream might make burning worse and she stopped it.  I do not think she takes daily trimethoprim.  History is little bit more challenging.  Clinically not infected today by send the urine for culture    PMH: Past Medical History:  Diagnosis Date  . Allergic rhinitis   . CAD (coronary artery disease)   .  Cancer ([place])    skin  . Chronic kidney disease    overactive bladder  . Colon polyp   . Diverticulosis    sigmoid and descending colon  . Elevated LFTs over one year ago  . Esophageal ring   . GERD (gastroesophageal reflux disease)   . History of hepatitis 1975   unsure of type  . Hyperlipemia   . Hypertension   . Internal hemorrhoids   . Lichen sclerosus [id number]   (DX BY GYN AFTER ABN PAP)  . Pemphigoid, cicatricial    D/O AFFECTING EYES, MOUTH, THROAT NOW (DX  [facility])  . Shingles 03/13/2013  . Urinary incontinence    OVERACTIVE BLADDER    Surgical History: Past Surgical History:  Procedure Laterality Date  . ABDOMINAL HYSTERECTOMY    . ANGIOPLASTY  yrs ago   LAD  . CARDIAC ELECTROPHYSIOLOGY MAPPING AND ABLATION    . CATARACT EXTRACTION Bilateral   . COLONOSCOPY    . CYSTOSCOPY WITH BIOPSY N/A 03/22/2019   Procedure: CYSTOSCOPY WITH BLADDER BIOPSY WITH GEMCITABINE;  Surgeon: Billey Co, MD;  Location: [facility];  Service: Urology;  Laterality: N/A;  . CYSTOSCOPY WITH FULGERATION N/A 03/22/2019   Procedure: CYSTOSCOPY WITH FULGERATION;  Surgeon: Billey Co, MD;  Location: [facility];  Service: Urology;  Laterality: N/A;  . ESOPHAGOGASTRODUODENOSCOPY  09/14/2011   Procedure: ESOPHAGOGASTRODUODENOSCOPY (EGD);  Surgeon: Gatha Mayer, MD;  Location: [facility];  Service: Endoscopy;  Laterality: N/A;  needs xray  . ESOPHAGOGASTRODUODENOSCOPY N/A 01/02/2013   Procedure: ESOPHAGOGASTRODUODENOSCOPY (EGD);  Surgeon: Gatha Mayer, MD;  Location: [facility];  Service: Endoscopy;  Laterality: N/A;  . ESOPHAGOGASTRODUODENOSCOPY N/A 04/12/2013   Procedure: ESOPHAGOGASTRODUODENOSCOPY (EGD);  Surgeon: Gatha Mayer, MD;  Location: [facility];  Service: Endoscopy;  Laterality: N/A;  . ESOPHAGOGASTRODUODENOSCOPY (EGD) WITH PROPOFOL N/A 05/24/2016   Procedure: ESOPHAGOGASTRODUODENOSCOPY (EGD) WITH PROPOFOL;  Surgeon: Gatha Mayer, MD;  Location: [facility];  Service: Endoscopy;  Laterality: N/A;  . ESOPHAGOGASTRODUODENOSCOPY (EGD) WITH PROPOFOL N/A 08/22/2017   Procedure: ESOPHAGOGASTRODUODENOSCOPY (EGD) WITH PROPOFOL;  Surgeon: Gatha Mayer, MD;  Location: [facility];  Service: Endoscopy;  Laterality: N/A;  . ESOPHAGOGASTRODUODENOSCOPY (EGD) WITH PROPOFOL N/A 02/26/2019   Procedure: ESOPHAGOGASTRODUODENOSCOPY (EGD) WITH PROPOFOL;  Surgeon: Gatha Mayer, MD;  Location: [facility];  Service: Endoscopy;  Laterality: N/A;  . Venia Minks DILATION  N/A 01/02/2013   Procedure: Venia Minks DILATION;  Surgeon: Gatha Mayer, MD;  Location: [facility];  Service: Endoscopy;  Laterality: N/A;  . Venia Minks DILATION N/A 05/24/2016   Procedure: Venia Minks DILATION;  Surgeon: Gatha Mayer, MD;  Location: [facility];  Service: Endoscopy;  Laterality: N/A;  . Venia Minks DILATION N/A 08/22/2017   Procedure: Venia Minks DILATION;  Surgeon: Gatha Mayer, MD;  Location: [facility];  Service: Endoscopy;  Laterality: N/A;  Venia Minks DILATION  02/26/2019   Procedure: Venia Minks DILATION;  Surgeon: Gatha Mayer, MD;  Location: [facility];  Service: Endoscopy;;  . NOSE SURGERY     Biopsy  . OOPHORECTOMY    . SAVORY DILATION  09/14/2011   Procedure: SAVORY DILATION;  Surgeon: Gatha Mayer, MD;  Location: [facility];  Service: Endoscopy;  Laterality: N/A;  . SAVORY DILATION N/A 01/02/2013   Procedure: SAVORY DILATION;  Surgeon: Gatha Mayer, MD;  Location: [facility];  Service: Endoscopy;  Laterality: N/A;  . SAVORY DILATION N/A 04/12/2013   Procedure: SAVORY DILATION;  Surgeon: Gatha Mayer, MD;  Location: [facility];  Service: Endoscopy;  Laterality: N/A;  .  skin cancer removal  02/2016    Home Medications:  Allergies as of 06/10/2019      Reactions   Adhesive [tape] Rash   Paper tape and tegaderm OK   Aspirin Other (See Comments)   Bleeds too easily   Latex Itching, Rash   Lipitor [atorvastatin] Other (See Comments)   Elevated LFT's   Tetanus Toxoid Rash   Ultram [tramadol] Other (See Comments)   Dizziness      Medication List       Accurate as of June 10, 2019  8:25 AM. If you have any questions, ask your nurse or doctor.        acetaminophen 500 MG tablet Commonly known as: TYLENOL Take 500 mg by mouth daily as needed for moderate pain or headache.   brimonidine 0.2 % ophthalmic solution Commonly known as: ALPHAGAN Place 1 drop into both eyes daily.   fluticasone 50 MCG/ACT nasal spray Commonly known as: FLONASE Place 2  sprays into both nostrils daily as needed for allergies. What changed: when to take this   lisinopril 10 MG tablet Commonly known as: ZESTRIL Take 1 tablet by mouth once daily   omeprazole 40 MG capsule Commonly known as: PRILOSEC Take 1 capsule (40 mg total) by mouth daily.   Omnipred 1 % ophthalmic suspension Generic drug: prednisoLONE acetate Place 1 drop into both eyes daily.   pravastatin 40 MG tablet Commonly known as: PRAVACHOL Take 1 tablet (40 mg total) by mouth daily. What changed: when to take this   sodium chloride 0.65 % Soln nasal spray Commonly known as: OCEAN Place 1 spray into both nostrils daily as needed for congestion.   timolol 0.5 % ophthalmic solution Commonly known as: BETIMOL Place 1 drop into both eyes 2 (two) times daily.   trimethoprim 100 MG tablet Commonly known as: TRIMPEX Take 1 tablet (100 mg total) by mouth daily. What changed: when to take this       Allergies:  Allergies  Allergen Reactions  . Adhesive [Tape] Rash    Paper tape and tegaderm OK  . Aspirin Other (See Comments)    Bleeds too easily  . Latex Itching and Rash  . Lipitor [Atorvastatin] Other (See Comments)    Elevated LFT's  . Tetanus Toxoid Rash  . Ultram [Tramadol] Other (See Comments)    Dizziness    Family History: Family History  Problem Relation Age of Onset  . Lung cancer Father   . Esophageal cancer Son 64  . Stomach cancer Son 64  . Stroke Mother        CVA  . Alzheimer's disease Mother   . Hypertension Brother   . Breast cancer Maternal Aunt   . Colon cancer Neg Hx   . Rectal cancer Neg Hx     Social History:  reports that she quit smoking about 51 years ago. She has never used smokeless tobacco. She reports that she does not drink alcohol or use drugs.  ROS:                                        Physical Exam: Ht 5\' 1"  (1.549 m)   LMP 02/29/1968   BMI 20.91 kg/m    Laboratory Data: Lab Results  Component  Value Date   WBC 4.5 08/28/2018   HGB 11.5 (L) 08/28/2018   HCT 34.9 (L) 08/28/2018   MCV 84.2 08/28/2018  PLT 193.0 08/28/2018    Lab Results  Component Value Date   CREATININE 0.73 03/20/2019    No results found for: PSA  No results found for: TESTOSTERONE  Lab Results  Component Value Date   HGBA1C 6.0 08/28/2018    Urinalysis    Component Value Date/Time   COLORURINE YELLOW 08/26/2009 0833   APPEARANCEUR Clear 03/07/2019 0913   LABSPEC 1.010 08/26/2009 0833   PHURINE 5.5 08/26/2009 0833   GLUCOSEU Negative 03/07/2019 0913   HGBUR NEGATIVE 08/26/2009 0833   HGBUR trace-intact 09/21/2007 1119   BILIRUBINUR Negative 03/07/2019 0913   KETONESUR NEGATIVE 08/26/2009 0833   PROTEINUR Negative 03/07/2019 0913   PROTEINUR NEGATIVE 08/26/2009 0833   UROBILINOGEN 0.2 11/07/2018 1521   UROBILINOGEN 0.2 08/26/2009 0833   NITRITE Negative 03/07/2019 0913   NITRITE NEGATIVE 08/26/2009 0833   LEUKOCYTESUR 1+ (A) 03/07/2019 0913    Pertinent Imaging:   Assessment & Plan: Reassess in 6 or 7 weeks on Vesicare 5 mg plus Myrbetriq.  Send urine for culture.  Reconsider starting prophylaxis if she starts getting positive cultures.  Percutaneous tibial nerve stimulation option next visit  There are no diagnoses linked to this encounter.  No follow-ups on file.  Reece Packer, MD  [facility] [address] [place], [place] [address] [phone]

## 2019-06-14 LAB — CULTURE, URINE COMPREHENSIVE

## 2019-06-19 ENCOUNTER — Telehealth: Payer: Self-pay | Admitting: Internal Medicine

## 2019-06-19 NOTE — Telephone Encounter (Signed)
She is having some swallowing difficulty and is in need of an EGD and dilation in the [facility]  She wants to do this in June  Please arrange a pre-visit and EGD for this.  I know she is 67 but am ok with direct EGd have done in past  Thanks

## 2019-06-20 ENCOUNTER — Encounter: Payer: Self-pay | Admitting: Internal Medicine

## 2019-06-20 NOTE — Telephone Encounter (Signed)
Patient scheduled for pre-visit 08/06/19 @ 10:30am and endo 08/22/2019@ 1:30pm

## 2019-07-01 DIAGNOSIS — H40003 Preglaucoma, unspecified, bilateral: Secondary | ICD-10-CM | POA: Diagnosis not present

## 2019-07-04 ENCOUNTER — Other Ambulatory Visit: Payer: Self-pay

## 2019-07-04 DIAGNOSIS — N3946 Mixed incontinence: Secondary | ICD-10-CM

## 2019-07-04 MED ORDER — MIRABEGRON ER 50 MG PO TB24: 50.0000 mg | ORAL_TABLET | Freq: Every day | ORAL | 11 refills | Status: DC

## 2019-07-04 NOTE — Telephone Encounter (Signed)
See my chart message

## 2019-07-05 ENCOUNTER — Telehealth: Payer: Self-pay

## 2019-07-05 NOTE — Telephone Encounter (Signed)
[facility] called, patient would like a prior auth for a price decrease on myrbetriq. The price decrease was denied. [facility] will send a letter incase you would like to appeal

## 2019-07-08 DIAGNOSIS — H4063X0 Glaucoma secondary to drugs, bilateral, stage unspecified: Secondary | ICD-10-CM | POA: Diagnosis not present

## 2019-07-08 NOTE — Telephone Encounter (Signed)
If something can be done to decrease co-pay please go ahead but otherwise there is not much I can do thanks

## 2019-07-09 NOTE — Telephone Encounter (Signed)
Incoming denial of Myrbetriq by patients P[employer] consultant. Appeal filed via fax. Pt informed appeal process was started. Case [id number]

## 2019-07-10 DIAGNOSIS — H40003 Preglaucoma, unspecified, bilateral: Secondary | ICD-10-CM | POA: Diagnosis not present

## 2019-07-17 DIAGNOSIS — H4063X0 Glaucoma secondary to drugs, bilateral, stage unspecified: Secondary | ICD-10-CM | POA: Diagnosis not present

## 2019-07-18 ENCOUNTER — Ambulatory Visit (INDEPENDENT_AMBULATORY_CARE_PROVIDER_SITE_OTHER): Payer: Medicare Other | Admitting: Urology

## 2019-07-18 ENCOUNTER — Encounter: Payer: Self-pay | Admitting: Urology

## 2019-07-18 ENCOUNTER — Other Ambulatory Visit: Payer: Self-pay

## 2019-07-18 VITALS — BP 158/77 | HR 67 | Ht 60.0 in | Wt 105.8 lb

## 2019-07-18 DIAGNOSIS — D494 Neoplasm of unspecified behavior of bladder: Secondary | ICD-10-CM

## 2019-07-18 DIAGNOSIS — N3281 Overactive bladder: Secondary | ICD-10-CM

## 2019-07-18 MED ORDER — CIPROFLOXACIN HCL 500 MG PO TABS
500.0000 mg | ORAL_TABLET | Freq: Once | ORAL | Status: AC
  Administered 2019-07-18: 500 mg via ORAL

## 2019-07-18 MED ORDER — LIDOCAINE HCL URETHRAL/MUCOSAL 2 % EX GEL
1.0000 "application " | Freq: Once | CUTANEOUS | Status: AC
  Administered 2019-07-18: 1 via URETHRAL

## 2019-07-18 NOTE — Progress Notes (Signed)
Cystoscopy Procedure Note:  Indication: Hx of bladder cancer  Victoria Allison is a 79 y.o. female moderate to severe OAB symptoms and urge incontinence followed by Dr. Matilde Sprang and was found to have a small bladder tumor in January 2021 and underwent biopsy and fulguration. Here today for first surveillance cysto.  Initial Diagnosis of Bladder cancer 03/22/2019: Biopsy and fulguration of 68mm papillary tumor medial to right ureteral orifice, HG Ta  Treatments for Bladder Cancer She deferred 2nd look TURBT and intravesical treatment 2/2 co-morbidities  AUA Risk Category Intermediate  Cystoscopy Procedure Note: After informed consent and discussion of the procedure and its risks, Victoria Allison was positioned and prepped in the standard fashion. Cystoscopy was performed with the a flexible cystoscope.  Significantly atrophic introitus and urethra makes cystoscopy challenging.  Urine was cloudy and limited vision, but there were no obvious tumor seen on thorough inspection of the bladder.  Both ureteral orifice ease were identified and no tumor seen nearby.  Cytology was not sent with her comorbidities.  Urine sent for culture Continue follow-up with MacDiarmid re:OAB RTC 6 months repeat cystoscopy  Nickolas Madrid, MD 07/18/2019

## 2019-07-18 NOTE — Addendum Note (Signed)
Addended by: Donalee Citrin on: 07/18/2019 01:05 PM   Modules accepted: Orders

## 2019-07-19 LAB — URINALYSIS, COMPLETE
Bilirubin, UA: NEGATIVE
Glucose, UA: NEGATIVE
Ketones, UA: NEGATIVE
Nitrite, UA: NEGATIVE
Protein,UA: NEGATIVE
Specific Gravity, UA: 1.015 (ref 1.005–1.030)
Urobilinogen, Ur: 1 mg/dL (ref 0.2–1.0)
pH, UA: 7 (ref 5.0–7.5)

## 2019-07-19 LAB — MICROSCOPIC EXAMINATION: WBC, UA: >30 /hpf — AB (ref 0–5)

## 2019-07-21 LAB — CULTURE, URINE COMPREHENSIVE

## 2019-07-22 ENCOUNTER — Telehealth: Payer: Self-pay

## 2019-07-22 DIAGNOSIS — A498 Other bacterial infections of unspecified site: Secondary | ICD-10-CM

## 2019-07-22 MED ORDER — SULFAMETHOXAZOLE-TRIMETHOPRIM 800-160 MG PO TABS: 1.0000 | ORAL_TABLET | Freq: Two times a day (BID) | ORAL | 0 refills | Status: AC

## 2019-07-22 NOTE — Telephone Encounter (Signed)
Called pt informed her of the information below. Pt gave verbal understanding. RX sent.    Bactrim DS BID x 3 days for UTI, hopefully will improve her overactive symptoms and leakage   Nickolas Madrid, MD  07/22/2019

## 2019-07-24 DIAGNOSIS — H4063X0 Glaucoma secondary to drugs, bilateral, stage unspecified: Secondary | ICD-10-CM | POA: Diagnosis not present

## 2019-08-05 ENCOUNTER — Other Ambulatory Visit: Payer: Self-pay

## 2019-08-05 ENCOUNTER — Ambulatory Visit (INDEPENDENT_AMBULATORY_CARE_PROVIDER_SITE_OTHER): Payer: Medicare Other | Admitting: Urology

## 2019-08-05 VITALS — BP 137/82 | HR 68 | Ht 60.0 in | Wt 105.0 lb

## 2019-08-05 DIAGNOSIS — N3946 Mixed incontinence: Secondary | ICD-10-CM | POA: Diagnosis not present

## 2019-08-05 DIAGNOSIS — I2510 Atherosclerotic heart disease of native coronary artery without angina pectoris: Secondary | ICD-10-CM | POA: Diagnosis not present

## 2019-08-05 NOTE — Progress Notes (Signed)
08/05/2019 8:40 AM   Victoria Allison Revonda Humphrey [birth year] [id number]  Referring provider: Abner Greenspan, MD [address] [place],  [place] [address]  Chief Complaint  Patient presents with  . Urinary Incontinence    HPI: The patient in the last month has vaginal discomfort when she voids but is relieved by voiding but not on every occasion. Some of the d etails of the history were difficult. Last week she had low back pain radiating to her left leg and the settled down.  It was more challenging to baseline her symptoms. She was having urge incontinence wearing 2 pads during the day and 1 small pad at night. She said the leakage was mild.  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 Some atrophic changes  Vaginal dryness is another potential target but may be challenging with a narrow introitus  Last urine culture positive.The patient is having less discomfort but again she is nonspecific. No more back pain since the Macrodantin.I think she still has some urge incontinence especially in the middle of the night  Cystoscopy: On cystoscopy initially I thought the bladder mucosa was completely normal. It was a little bit erythematous along the right lateral wall and there appeared to be a few millimeters of elevation or lesions that may represent mild papillary superficial carcinoma. She smoked in the 1970s. She also had significant pseudomembranous trigonitis next to it. I tried to use the flow to see if it would move the initial tensional papillary lesions but they did not  Patient had bladder biopsy with high-grade papillary urothelial carcinoma but patient refused BCG.  It was a very small tumor approximately 5 mm in size.  She was to be followed every 4 to 5 months instead of 3 months because of comorbidities.  She was prescribed  Myrbetriq and oxybutynin discontinued.  She was recommended Premarin cream for mild burning.  Myrbetriq helps 20%.  Premarin cream might make burning worse and she stopped it.  I do not think she takes daily trimethoprim.  History is little bit more challenging.  Clinically not infected today by send the urine for culture  Reassess in 6 or 7 weeks on Vesicare 5 mg plus Myrbetriq.  Send urine for culture.  Reconsider starting prophylaxis if she starts getting positive cultures.  Percutaneous tibial nerve stimulation option next visit  Today Patient had cystoscopy by my partner May 20 and will have another 1 in 6 months.  It was negative.  Urine was cloudy.  Culture was positive and she was treated with an antibiotic.  She is having trouble affording the Myrbetriq.  Culture the months previous was negative  Clinically not infected.  She may be chronically colonized.  I did not send urine for culture  She is a partial responder to oxybutynin Myrbetriq but it $700 every 6 months.  I gave her 3 months of samples and see her every 6 months and will try to do the same to reduce her cost.  Frequency stable    PMH: Past Medical History:  Diagnosis Date  . Allergic rhinitis   . CAD (coronary artery disease)   . Cancer ([place])    skin  . Chronic kidney disease    overactive bladder  . Colon polyp   . Diverticulosis    sigmoid and descending colon  . Elevated LFTs over one  year ago  . Esophageal ring   . GERD (gastroesophageal reflux disease)   . History of hepatitis 1975   unsure of type  . Hyperlipemia   . Hypertension   . Internal hemorrhoids   . Lichen sclerosus 61/44   (DX BY GYN AFTER ABN PAP)  . Pemphigoid, cicatricial    D/O AFFECTING EYES, MOUTH, THROAT NOW (DX [facility])  . Shingles 03/13/2013  . Urinary incontinence    OVERACTIVE BLADDER    Surgical History: Past Surgical History:  Procedure Laterality Date  . ABDOMINAL HYSTERECTOMY    . ANGIOPLASTY  yrs ago   LAD  .  CARDIAC ELECTROPHYSIOLOGY MAPPING AND ABLATION    . CATARACT EXTRACTION Bilateral   . COLONOSCOPY    . CYSTOSCOPY WITH BIOPSY N/A 03/22/2019   Procedure: CYSTOSCOPY WITH BLADDER BIOPSY WITH GEMCITABINE;  Surgeon: Billey Co, MD;  Location: [facility];  Service: Urology;  Laterality: N/A;  . CYSTOSCOPY WITH FULGERATION N/A 03/22/2019   Procedure: CYSTOSCOPY WITH FULGERATION;  Surgeon: Billey Co, MD;  Location: [facility];  Service: Urology;  Laterality: N/A;  . ESOPHAGOGASTRODUODENOSCOPY  09/14/2011   Procedure: ESOPHAGOGASTRODUODENOSCOPY (EGD);  Surgeon: Gatha Mayer, MD;  Location: [facility];  Service: Endoscopy;  Laterality: N/A;  needs xray  . ESOPHAGOGASTRODUODENOSCOPY N/A 01/02/2013   Procedure: ESOPHAGOGASTRODUODENOSCOPY (EGD);  Surgeon: Gatha Mayer, MD;  Location: [facility];  Service: Endoscopy;  Laterality: N/A;  . ESOPHAGOGASTRODUODENOSCOPY N/A 04/12/2013   Procedure: ESOPHAGOGASTRODUODENOSCOPY (EGD);  Surgeon: Gatha Mayer, MD;  Location: [facility];  Service: Endoscopy;  Laterality: N/A;  . ESOPHAGOGASTRODUODENOSCOPY (EGD) WITH PROPOFOL N/A 05/24/2016   Procedure: ESOPHAGOGASTRODUODENOSCOPY (EGD) WITH PROPOFOL;  Surgeon: Gatha Mayer, MD;  Location: [facility];  Service: Endoscopy;  Laterality: N/A;  . ESOPHAGOGASTRODUODENOSCOPY (EGD) WITH PROPOFOL N/A 08/22/2017   Procedure: ESOPHAGOGASTRODUODENOSCOPY (EGD) WITH PROPOFOL;  Surgeon: Gatha Mayer, MD;  Location: [facility];  Service: Endoscopy;  Laterality: N/A;  . ESOPHAGOGASTRODUODENOSCOPY (EGD) WITH PROPOFOL N/A 02/26/2019   Procedure: ESOPHAGOGASTRODUODENOSCOPY (EGD) WITH PROPOFOL;  Surgeon: Gatha Mayer, MD;  Location: [facility];  Service: Endoscopy;  Laterality: N/A;  . Venia Minks DILATION N/A 01/02/2013   Procedure: Venia Minks DILATION;  Surgeon: Gatha Mayer, MD;  Location: [facility];  Service: Endoscopy;  Laterality: N/A;  . Venia Minks DILATION N/A 05/24/2016   Procedure: Venia Minks DILATION;  Surgeon:  Gatha Mayer, MD;  Location: [facility];  Service: Endoscopy;  Laterality: N/A;  . Venia Minks DILATION N/A 08/22/2017   Procedure: Venia Minks DILATION;  Surgeon: Gatha Mayer, MD;  Location: [facility];  Service: Endoscopy;  Laterality: N/A;  Venia Minks DILATION  02/26/2019   Procedure: Venia Minks DILATION;  Surgeon: Gatha Mayer, MD;  Location: [facility];  Service: Endoscopy;;  . NOSE SURGERY     Biopsy  . OOPHORECTOMY    . SAVORY DILATION  09/14/2011   Procedure: SAVORY DILATION;  Surgeon: Gatha Mayer, MD;  Location: [facility];  Service: Endoscopy;  Laterality: N/A;  . SAVORY DILATION N/A 01/02/2013   Procedure: SAVORY DILATION;  Surgeon: Gatha Mayer, MD;  Location: [facility];  Service: Endoscopy;  Laterality: N/A;  . SAVORY DILATION N/A 04/12/2013   Procedure: SAVORY DILATION;  Surgeon: Gatha Mayer, MD;  Location: [facility];  Service: Endoscopy;  Laterality: N/A;  . skin cancer removal  02/2016    Home Medications:  Allergies as of 08/05/2019      Reactions   Adhesive [tape] Rash   Paper tape and tegaderm OK  Aspirin Other (See Comments)   Bleeds too easily   Latex Itching, Rash   Lipitor [atorvastatin] Other (See Comments)   Elevated LFT's   Tetanus Toxoid Rash   Ultram [tramadol] Other (See Comments)   Dizziness      Medication List       Accurate as of August 05, 2019  8:40 AM. If you have any questions, ask your nurse or doctor.        acetaminophen 500 MG tablet Commonly known as: TYLENOL Take 500 mg by mouth daily as needed for moderate pain or headache.   brimonidine 0.2 % ophthalmic solution Commonly known as: ALPHAGAN Place 1 drop into both eyes daily.   fluticasone 50 MCG/ACT nasal spray Commonly known as: FLONASE Place 2 sprays into both nostrils daily as needed for allergies. What changed: when to take this   lisinopril 10 MG tablet Commonly known as: ZESTRIL Take 1 tablet by mouth once daily   mirabegron ER 50 MG Tb24  tablet Commonly known as: Myrbetriq Take 1 tablet (50 mg total) by mouth daily.   omeprazole 40 MG capsule Commonly known as: PRILOSEC Take 1 capsule (40 mg total) by mouth daily.   Omnipred 1 % ophthalmic suspension Generic drug: prednisoLONE acetate Place 1 drop into both eyes daily.   pravastatin 40 MG tablet Commonly known as: PRAVACHOL Take 1 tablet (40 mg total) by mouth daily. What changed: when to take this   Rocklatan 0.02-0.005 % Soln Generic drug: Netarsudil-Latanoprost   sodium chloride 0.65 % Soln nasal spray Commonly known as: OCEAN Place 1 spray into both nostrils daily as needed for congestion.   solifenacin 5 MG tablet Commonly known as: VESICARE Take 1 tablet (5 mg total) by mouth daily.   timolol 0.5 % ophthalmic solution Commonly known as: BETIMOL Place 1 drop into both eyes 2 (two) times daily.       Allergies:  Allergies  Allergen Reactions  . Adhesive [Tape] Rash    Paper tape and tegaderm OK  . Aspirin Other (See Comments)    Bleeds too easily  . Latex Itching and Rash  . Lipitor [Atorvastatin] Other (See Comments)    Elevated LFT's  . Tetanus Toxoid Rash  . Ultram [Tramadol] Other (See Comments)    Dizziness    Family History: Family History  Problem Relation Age of Onset  . Lung cancer Father   . Esophageal cancer Son 13  . Stomach cancer Son 20  . Stroke Mother        CVA  . Alzheimer's disease Mother   . Hypertension Brother   . Breast cancer Maternal Aunt   . Colon cancer Neg Hx   . Rectal cancer Neg Hx     Social History:  reports that she quit smoking about 51 years ago. She has never used smokeless tobacco. She reports that she does not drink alcohol or use drugs.  ROS:                                        Physical Exam: LMP 02/29/1968   Constitutional:  Alert and oriented, No acute distress.  Laboratory Data: Lab Results  Component Value Date   WBC 4.5 08/28/2018   HGB 11.5 (L)  08/28/2018   HCT 34.9 (L) 08/28/2018   MCV 84.2 08/28/2018   PLT 193.0 08/28/2018    Lab Results  Component Value Date  CREATININE 0.73 03/20/2019    No results found for: PSA  No results found for: TESTOSTERONE  Lab Results  Component Value Date   HGBA1C 6.0 08/28/2018    Urinalysis    Component Value Date/Time   COLORURINE YELLOW 08/26/2009 0833   APPEARANCEUR Hazy (A) 07/18/2019 0932   LABSPEC 1.010 08/26/2009 0833   PHURINE 5.5 08/26/2009 0833   GLUCOSEU Negative 07/18/2019 0932   HGBUR NEGATIVE 08/26/2009 0833   HGBUR trace-intact 09/21/2007 1119   BILIRUBINUR Negative 07/18/2019 0932   KETONESUR NEGATIVE 08/26/2009 0833   PROTEINUR Negative 07/18/2019 0932   PROTEINUR NEGATIVE 08/26/2009 0833   UROBILINOGEN 0.2 11/07/2018 1521   UROBILINOGEN 0.2 08/26/2009 0833   NITRITE Negative 07/18/2019 0932   NITRITE NEGATIVE 08/26/2009 0833   LEUKOCYTESUR 2+ (A) 07/18/2019 0932    Pertinent Imaging:   Assessment & Plan: Reassess in 6 months  There are no diagnoses linked to this encounter.  No follow-ups on file.  Reece Packer, MD  [place] [address] [place], [place] [address] [phone]

## 2019-08-06 ENCOUNTER — Ambulatory Visit (AMBULATORY_SURGERY_CENTER): Payer: Self-pay

## 2019-08-06 ENCOUNTER — Other Ambulatory Visit: Payer: Self-pay

## 2019-08-06 VITALS — Ht 60.0 in | Wt 108.0 lb

## 2019-08-06 DIAGNOSIS — R1319 Other dysphagia: Secondary | ICD-10-CM

## 2019-08-06 DIAGNOSIS — R1310 Dysphagia, unspecified: Secondary | ICD-10-CM

## 2019-08-06 NOTE — Progress Notes (Signed)
No allergies to soy or egg Pt is not on blood thinners or diet pills Denies issues with sedation/intubation Denies atrial flutter/fib Denies constipation   Emmi instructions given to pt  Pt is aware of Covid safety and care partner requirements.  

## 2019-08-18 ENCOUNTER — Encounter: Payer: Self-pay | Admitting: Family Medicine

## 2019-08-22 ENCOUNTER — Encounter: Payer: Self-pay | Admitting: Internal Medicine

## 2019-08-22 ENCOUNTER — Other Ambulatory Visit: Payer: Self-pay

## 2019-08-22 ENCOUNTER — Ambulatory Visit (AMBULATORY_SURGERY_CENTER): Payer: Medicare Other | Admitting: Internal Medicine

## 2019-08-22 VITALS — BP 116/77 | HR 79 | Temp 96.9°F | Resp 14 | Ht 60.0 in | Wt 105.0 lb

## 2019-08-22 DIAGNOSIS — K208 Other esophagitis without bleeding: Secondary | ICD-10-CM | POA: Diagnosis not present

## 2019-08-22 DIAGNOSIS — K449 Diaphragmatic hernia without obstruction or gangrene: Secondary | ICD-10-CM

## 2019-08-22 DIAGNOSIS — K222 Esophageal obstruction: Secondary | ICD-10-CM

## 2019-08-22 DIAGNOSIS — R1310 Dysphagia, unspecified: Secondary | ICD-10-CM

## 2019-08-22 MED ORDER — SODIUM CHLORIDE 0.9 % IV SOLN: 500.0000 mL | INTRAVENOUS | Status: DC

## 2019-08-22 NOTE — Progress Notes (Signed)
Vs sm I have reviewed the patient's medical history in detail and updated the computerized patient record.

## 2019-08-22 NOTE — Op Note (Signed)
[facility] Patient Name: Victoria Allison Procedure Date: 08/22/2019 1:14 PM MRN: [id number] Endoscopist: Gatha Mayer , MD Age: 79 Referring MD:  Date of Birth: Apr 25, 1940 Gender: Female Account #: [id number] Procedure:                Upper GI endoscopy Indications:              Dysphagia, Stricture of the esophagus, For therapy                            of esophageal stricture Medicines:                Propofol per Anesthesia, Monitored Anesthesia Care Procedure:                Pre-Anesthesia Assessment:                           - Prior to the procedure, a History and Physical                            was performed, and patient medications and                            allergies were reviewed. The patient's tolerance of                            previous anesthesia was also reviewed. The risks                            and benefits of the procedure and the sedation                            options and risks were discussed with the patient.                            All questions were answered, and informed consent                            was obtained. Prior Anticoagulants: The patient has                            taken no previous anticoagulant or antiplatelet                            agents. ASA Grade Assessment: III - A patient with                            severe systemic disease. After reviewing the risks                            and benefits, the patient was deemed in                            satisfactory condition to undergo the procedure.  After obtaining informed consent, the endoscope was                            passed under direct vision. Throughout the                            procedure, the patient's blood pressure, pulse, and                            oxygen saturations were monitored continuously. The                            Endoscope was introduced through the mouth, and                             advanced to the antrum of the stomach. The upper GI                            endoscopy was accomplished without difficulty. The                            patient tolerated the procedure well. Scope In: Scope Out: Findings:                 Multiple benign-appearing, intrinsic moderate                            stenoses were found. The stenoses were traversed.                            The scope was withdrawn. Dilation was performed                            with a Maloney dilator with no resistance at 42 Fr.                            The scope was withdrawn. Dilation was performed                            with a Maloney dilator with mild resistance at 15                            Fr. The scope was withdrawn. Dilation was performed                            with a Maloney dilator with mild resistance at 69                            Fr. The scope was withdrawn. Dilation was performed                            with a Maloney dilator with moderate resistance at  26 Fr. The dilation site was examined following                            endoscope reinsertion and showed moderate mucosal                            disruption. Estimated blood loss was minimal.                           A 2 cm hiatal hernia was present.                           The entire examined stomach was normal.                           The cardia and gastric fundus were normal on                            retroflexion. Complications:            No immediate complications. Estimated Blood Loss:     Estimated blood loss was minimal. Impression:               - Benign-appearing esophageal stenoses. Dilated.                            Multple rings as before w/ lymphocytic esophagitis.                            Most of disruption seen in proximal esophagus.                           - 2 cm hiatal hernia.                           - Normal stomach.                           - No  specimens collected. Recommendation:           - Patient has a contact number available for                            emergencies. The signs and symptoms of potential                            delayed complications were discussed with the                            patient. Return to normal activities tomorrow.                            Written discharge instructions were provided to the                            patient.                           -  Clear liquids x 1 hour then soft foods rest of                            day. Start prior diet tomorrow.                           - Repeat upper endoscopy in 6 months for                            retreatment. Sooner prn - will check in at 6 mos Gatha Mayer, MD 08/22/2019 2:07:01 PM This report has been signed electronically.

## 2019-08-22 NOTE — Progress Notes (Signed)
Called to room to assist during endoscopic procedure.  Patient ID and intended procedure confirmed with present staff. Received instructions for my participation in the procedure from the performing physician.  

## 2019-08-22 NOTE — Progress Notes (Signed)
To PACU, VSS. Report to Rn. VO for additional meds.tb 

## 2019-08-22 NOTE — Patient Instructions (Addendum)
I dilated just like last time.  Hope that helps.   Will check back in about 6 months but you call sooner if needed.  I appreciate the opportunity to care for you. Gatha Mayer, MD, [facility]  Discharge instructions given. Handouts on a dilatation diet and a hiatal hernia. Resume previous diet. YOU HAD AN ENDOSCOPIC PROCEDURE TODAY AT [facility]:   Refer to the procedure report that was given to you for any specific questions about what was found during the examination.  If the procedure report does not answer your questions, please call your gastroenterologist to clarify.  If you requested that your care partner not be given the details of your procedure findings, then the procedure report has been included in a sealed envelope for you to review at your convenience later.  YOU SHOULD EXPECT: Some feelings of bloating in the abdomen. Passage of more gas than usual.  Walking can help get rid of the air that was put into your GI tract during the procedure and reduce the bloating. If you had a lower endoscopy (such as a colonoscopy or flexible sigmoidoscopy) you may notice spotting of blood in your stool or on the toilet paper. If you underwent a bowel prep for your procedure, you may not have a normal bowel movement for a few days.  Please Note:  You might notice some irritation and congestion in your nose or some drainage.  This is from the oxygen used during your procedure.  There is no need for concern and it should clear up in a day or so.  SYMPTOMS TO REPORT IMMEDIATELY:   Following upper endoscopy (EGD)  Vomiting of blood or coffee ground material  New chest pain or pain under the shoulder blades  Painful or persistently difficult swallowing  New shortness of breath  Fever of 100F or higher  Black, tarry-looking stools  For urgent or emergent issues, a gastroenterologist can be reached at any hour by calling [phone]. Do not use MyChart messaging for urgent  concerns.    DIET:  We do recommend a small meal at first, but then you may proceed to your regular diet.  Drink plenty of fluids but you should avoid alcoholic beverages for 24 hours.  ACTIVITY:  You should plan to take it easy for the rest of today and you should NOT DRIVE or use heavy machinery until tomorrow (because of the sedation medicines used during the test).    FOLLOW UP: Our staff will call the number listed on your records 48-72 hours following your procedure to check on you and address any questions or concerns that you may have regarding the information given to you following your procedure. If we do not reach you, we will leave a message.  We will attempt to reach you two times.  During this call, we will ask if you have developed any symptoms of COVID 19. If you develop any symptoms (ie: fever, flu-like symptoms, shortness of breath, cough etc.) before then, please call [phone].  If you test positive for Covid 19 in the 2 weeks post procedure, please call and report this information to [place].    If any biopsies were taken you will be contacted by phone or by letter within the next 1-3 weeks.  Please call us at [phone] if you have not heard about the biopsies in 3 weeks.    SIGNATURES/CONFIDENTIALITY: You and/or your care partner have signed paperwork which will be entered into your electronic  medical record.  These signatures attest to the fact that that the information above on your After Visit Summary has been reviewed and is understood.  Full responsibility of the confidentiality of this discharge information lies with you and/or your care-partner.

## 2019-08-26 ENCOUNTER — Telehealth: Payer: Self-pay

## 2019-08-26 NOTE — Telephone Encounter (Signed)
  Follow up Call-  Call back number 08/22/2019  Post procedure Call Back phone  # [phone]  Permission to leave phone message Yes  Some recent data might be hidden     Patient questions:  Do you have a fever, pain , or abdominal swelling? No. Pain Score  0 *  Have you tolerated food without any problems? Yes.    Have you been able to return to your normal activities? Yes.    Do you have any questions about your discharge instructions: Diet   No. Medications  No. Follow up visit  No.  Do you have questions or concerns about your Care? No.  Actions: * If pain score is 4 or above: No action needed, pain <4.  1. Have you developed a fever since your procedure? no  2.   Have you had an respiratory symptoms (SOB or cough) since your procedure? no  3.   Have you tested positive for COVID 19 since your procedure no  4.   Have you had any family members/close contacts diagnosed with the COVID 19 since your procedure?  No  Pt said "everyone was great, from the beginning to the end". maw   If yes to any of these questions please route to Joylene John, RN and Erenest Rasher, RN

## 2019-09-05 ENCOUNTER — Other Ambulatory Visit: Payer: Medicare Other | Admitting: Urology

## 2019-09-12 ENCOUNTER — Other Ambulatory Visit: Payer: Self-pay | Admitting: Family Medicine

## 2019-10-15 DIAGNOSIS — B9689 Other specified bacterial agents as the cause of diseases classified elsewhere: Secondary | ICD-10-CM | POA: Diagnosis not present

## 2019-10-15 DIAGNOSIS — Z03818 Encounter for observation for suspected exposure to other biological agents ruled out: Secondary | ICD-10-CM | POA: Diagnosis not present

## 2019-10-15 DIAGNOSIS — J329 Chronic sinusitis, unspecified: Secondary | ICD-10-CM | POA: Diagnosis not present

## 2019-10-23 ENCOUNTER — Other Ambulatory Visit: Payer: Self-pay | Admitting: Family Medicine

## 2019-12-06 ENCOUNTER — Telehealth: Payer: Self-pay | Admitting: Family Medicine

## 2019-12-06 DIAGNOSIS — L129 Pemphigoid, unspecified: Secondary | ICD-10-CM

## 2019-12-06 NOTE — Telephone Encounter (Signed)
Referral Request - Has patient seen PCP for this complaint? Yes.   *If NO, is insurance requiring patient see PCP for this issue before PCP can refer them? Referral for which specialty: Dermatology Preferred provider/office: Dr.Holahan// [facility]// [phone] Reason for referral: Issue getting worse

## 2019-12-12 ENCOUNTER — Other Ambulatory Visit: Payer: Self-pay | Admitting: Family Medicine

## 2019-12-16 ENCOUNTER — Other Ambulatory Visit: Payer: Self-pay | Admitting: Family Medicine

## 2019-12-16 ENCOUNTER — Encounter: Payer: Self-pay | Admitting: Family Medicine

## 2019-12-18 ENCOUNTER — Ambulatory Visit: Payer: Medicare Other

## 2019-12-18 ENCOUNTER — Other Ambulatory Visit: Payer: Self-pay

## 2019-12-18 DIAGNOSIS — Z23 Encounter for immunization: Secondary | ICD-10-CM | POA: Diagnosis not present

## 2019-12-23 ENCOUNTER — Telehealth: Payer: Self-pay | Admitting: Radiology

## 2019-12-23 DIAGNOSIS — L129 Pemphigoid, unspecified: Secondary | ICD-10-CM | POA: Diagnosis not present

## 2019-12-23 DIAGNOSIS — Z1159 Encounter for screening for other viral diseases: Secondary | ICD-10-CM | POA: Diagnosis not present

## 2019-12-23 DIAGNOSIS — Z7289 Other problems related to lifestyle: Secondary | ICD-10-CM | POA: Diagnosis not present

## 2019-12-23 DIAGNOSIS — Z85828 Personal history of other malignant neoplasm of skin: Secondary | ICD-10-CM | POA: Diagnosis not present

## 2019-12-23 DIAGNOSIS — Z79899 Other long term (current) drug therapy: Secondary | ICD-10-CM | POA: Diagnosis not present

## 2019-12-23 DIAGNOSIS — L121 Cicatricial pemphigoid: Secondary | ICD-10-CM | POA: Diagnosis not present

## 2019-12-23 NOTE — Telephone Encounter (Signed)
Patient reports burning with urination and frequency. States Myrbetriq isn't helping. Has had symptoms 2-3 months. Please advise.

## 2019-12-24 ENCOUNTER — Other Ambulatory Visit: Payer: Self-pay

## 2019-12-24 ENCOUNTER — Encounter: Payer: Self-pay | Admitting: Physician Assistant

## 2019-12-24 ENCOUNTER — Ambulatory Visit (INDEPENDENT_AMBULATORY_CARE_PROVIDER_SITE_OTHER): Payer: Medicare Other | Admitting: Physician Assistant

## 2019-12-24 VITALS — BP 128/72 | HR 68 | Ht 60.0 in | Wt 109.0 lb

## 2019-12-24 DIAGNOSIS — R300 Dysuria: Secondary | ICD-10-CM

## 2019-12-24 DIAGNOSIS — N3281 Overactive bladder: Secondary | ICD-10-CM

## 2019-12-24 DIAGNOSIS — N3946 Mixed incontinence: Secondary | ICD-10-CM | POA: Diagnosis not present

## 2019-12-24 LAB — URINALYSIS, COMPLETE
Bilirubin, UA: NEGATIVE
Glucose, UA: NEGATIVE
Ketones, UA: NEGATIVE
Nitrite, UA: NEGATIVE
Protein,UA: NEGATIVE
Specific Gravity, UA: 1.015 (ref 1.005–1.030)
Urobilinogen, Ur: 0.2 mg/dL (ref 0.2–1.0)
pH, UA: 5.5 (ref 5.0–7.5)

## 2019-12-24 LAB — MICROSCOPIC EXAMINATION: WBC, UA: >30 /hpf — AB (ref 0–5)

## 2019-12-24 NOTE — Patient Instructions (Signed)
CONTINUE solifenacin STOP Myrbetriq  I will send your urine for culture today and call you with your results when they come back.  We will get you set up for PTNS today.

## 2019-12-24 NOTE — Progress Notes (Signed)
12/24/2019 1:52 PM   Victoria Allison 02/19/1941 [id number]  CC: Chief Complaint  Patient presents with  . Urinary Incontinence    HPI: Victoria Allison is a 79 y.o. female with a history of overactive bladder with urge incontinence on Myrbetriq and bladder cancer who has refused BCG who presents today for evaluation of possible UTI.   Today she reports a 2 to 40-month history of dysuria as well as ongoing urgency, frequency, and urge incontinence.  She reports using approximately 12 pads daily.  Today she states she does not believe the Myrbetriq ever improved her symptoms.  She is continue to take Myrbetriq 50 mg daily as prescribed.  She is also taking solifenacin 5 mg daily.  On chart review, she was previously noted to have incomplete therapeutic response on Myrbetriq.  In-office UA today positive for trace intact blood and 2+ leukocyte esterase; urine microscopy with >30 WBCs/HPF and many bacteria.   PMH: Past Medical History:  Diagnosis Date  . Allergic rhinitis   . CAD (coronary artery disease)   . Cancer ([place])    skin  . Chronic kidney disease    overactive bladder  . Colon polyp   . Diverticulosis    sigmoid and descending colon  . Elevated LFTs over one year ago  . Esophageal ring   . GERD (gastroesophageal reflux disease)   . History of hepatitis 1975   unsure of type  . Hyperlipemia   . Hypertension   . Internal hemorrhoids   . Lichen sclerosus 28/36   (DX BY GYN AFTER ABN PAP)  . Pemphigoid, cicatricial    D/O AFFECTING EYES, MOUTH, THROAT NOW (DX [facility])  . Shingles 03/13/2013  . Urinary incontinence    OVERACTIVE BLADDER    Surgical History: Past Surgical History:  Procedure Laterality Date  . ABDOMINAL HYSTERECTOMY    . ANGIOPLASTY  yrs ago   LAD  . CARDIAC ELECTROPHYSIOLOGY MAPPING AND ABLATION    . CATARACT EXTRACTION Bilateral   . COLONOSCOPY    . CYSTOSCOPY WITH BIOPSY N/A 03/22/2019   Procedure: CYSTOSCOPY WITH BLADDER BIOPSY WITH  GEMCITABINE;  Surgeon: Billey Co, MD;  Location: [facility];  Service: Urology;  Laterality: N/A;  . CYSTOSCOPY WITH FULGERATION N/A 03/22/2019   Procedure: CYSTOSCOPY WITH FULGERATION;  Surgeon: Billey Co, MD;  Location: [facility];  Service: Urology;  Laterality: N/A;  . ESOPHAGOGASTRODUODENOSCOPY  09/14/2011   Procedure: ESOPHAGOGASTRODUODENOSCOPY (EGD);  Surgeon: Gatha Mayer, MD;  Location: [facility];  Service: Endoscopy;  Laterality: N/A;  needs xray  . ESOPHAGOGASTRODUODENOSCOPY N/A 01/02/2013   Procedure: ESOPHAGOGASTRODUODENOSCOPY (EGD);  Surgeon: Gatha Mayer, MD;  Location: [facility];  Service: Endoscopy;  Laterality: N/A;  . ESOPHAGOGASTRODUODENOSCOPY N/A 04/12/2013   Procedure: ESOPHAGOGASTRODUODENOSCOPY (EGD);  Surgeon: Gatha Mayer, MD;  Location: [facility];  Service: Endoscopy;  Laterality: N/A;  . ESOPHAGOGASTRODUODENOSCOPY (EGD) WITH PROPOFOL N/A 05/24/2016   Procedure: ESOPHAGOGASTRODUODENOSCOPY (EGD) WITH PROPOFOL;  Surgeon: Gatha Mayer, MD;  Location: [facility];  Service: Endoscopy;  Laterality: N/A;  . ESOPHAGOGASTRODUODENOSCOPY (EGD) WITH PROPOFOL N/A 08/22/2017   Procedure: ESOPHAGOGASTRODUODENOSCOPY (EGD) WITH PROPOFOL;  Surgeon: Gatha Mayer, MD;  Location: [facility];  Service: Endoscopy;  Laterality: N/A;  . ESOPHAGOGASTRODUODENOSCOPY (EGD) WITH PROPOFOL N/A 02/26/2019   Procedure: ESOPHAGOGASTRODUODENOSCOPY (EGD) WITH PROPOFOL;  Surgeon: Gatha Mayer, MD;  Location: [facility];  Service: Endoscopy;  Laterality: N/A;  . Venia Minks DILATION N/A 01/02/2013   Procedure: Venia Minks DILATION;  Surgeon: Gatha Mayer, MD;  Location: [facility];  Service: Endoscopy;  Laterality: N/A;  . Venia Minks DILATION N/A 05/24/2016   Procedure: Venia Minks DILATION;  Surgeon: Gatha Mayer, MD;  Location: [facility];  Service: Endoscopy;  Laterality: N/A;  . Venia Minks DILATION N/A 08/22/2017   Procedure: Venia Minks DILATION;  Surgeon: Gatha Mayer, MD;  Location:  [facility];  Service: Endoscopy;  Laterality: N/A;  Venia Minks DILATION  02/26/2019   Procedure: Venia Minks DILATION;  Surgeon: Gatha Mayer, MD;  Location: [facility];  Service: Endoscopy;;  . NOSE SURGERY     Biopsy  . OOPHORECTOMY    . SAVORY DILATION  09/14/2011   Procedure: SAVORY DILATION;  Surgeon: Gatha Mayer, MD;  Location: [facility];  Service: Endoscopy;  Laterality: N/A;  . SAVORY DILATION N/A 01/02/2013   Procedure: SAVORY DILATION;  Surgeon: Gatha Mayer, MD;  Location: [facility];  Service: Endoscopy;  Laterality: N/A;  . SAVORY DILATION N/A 04/12/2013   Procedure: SAVORY DILATION;  Surgeon: Gatha Mayer, MD;  Location: [facility];  Service: Endoscopy;  Laterality: N/A;  . skin cancer removal  02/2016    Home Medications:  Allergies as of 12/24/2019      Reactions   Adhesive [tape] Rash   Paper tape and tegaderm OK   Aspirin Other (See Comments)   Bleeds too easily   Latex Itching, Rash   Lipitor [atorvastatin] Other (See Comments)   Elevated LFT's   Tetanus Toxoid Rash   Ultram [tramadol] Other (See Comments)   Dizziness      Medication List       Accurate as of December 24, 2019  1:52 PM. If you have any questions, ask your nurse or doctor.        acetaminophen 500 MG tablet Commonly known as: TYLENOL Take 500 mg by mouth daily as needed for moderate pain or headache.   bimatoprost 0.03 % ophthalmic solution Commonly known as: LUMIGAN 1 drop at bedtime.   fluticasone 50 MCG/ACT nasal spray Commonly known as: FLONASE Place 2 sprays into both nostrils daily as needed for allergies. What changed: when to take this   lisinopril 10 MG tablet Commonly known as: ZESTRIL Take 1 tablet by mouth once daily   mirabegron ER 50 MG Tb24 tablet Commonly known as: Myrbetriq Take 1 tablet (50 mg total) by mouth daily.   omeprazole 40 MG capsule Commonly known as: PRILOSEC Take 1 capsule by mouth once daily   Omnipred 1 % ophthalmic  suspension Generic drug: prednisoLONE acetate Place 1 drop into both eyes daily.   pravastatin 40 MG tablet Commonly known as: PRAVACHOL Take 1 tablet by mouth once daily   sodium chloride 0.65 % Soln nasal spray Commonly known as: OCEAN Place 1 spray into both nostrils daily as needed for congestion.   solifenacin 5 MG tablet Commonly known as: VESICARE Take 1 tablet (5 mg total) by mouth daily.   timolol 0.5 % ophthalmic solution Commonly known as: BETIMOL Place 1 drop into both eyes 2 (two) times daily.       Allergies:  Allergies  Allergen Reactions  . Adhesive [Tape] Rash    Paper tape and tegaderm OK  . Aspirin Other (See Comments)    Bleeds too easily  . Latex Itching and Rash  . Lipitor [Atorvastatin] Other (See Comments)    Elevated LFT's  . Tetanus Toxoid Rash  . Ultram [Tramadol] Other (See Comments)    Dizziness    Family History: Family History  Problem Relation Age  of Onset  . Lung cancer Father   . Esophageal cancer Son 65  . Stomach cancer Son 64  . Stroke Mother        CVA  . Alzheimer's disease Mother   . Hypertension Brother   . Breast cancer Maternal Aunt   . Colon cancer Neg Hx   . Rectal cancer Neg Hx   . Colon polyps Neg Hx     Social History:   reports that she quit smoking about 51 years ago. She has never used smokeless tobacco. She reports that she does not drink alcohol and does not use drugs.  Physical Exam: BP 128/72   Pulse 68   Ht 5' (1.524 m)   Wt 109 lb (49.4 kg)   LMP 02/29/1968   BMI 21.29 kg/m   Constitutional:  Alert and oriented, no acute distress, nontoxic appearing HEENT: [place], AT Cardiovascular: No clubbing, cyanosis, or edema Respiratory: Normal respiratory effort, no increased work of breathing Skin: No rashes, bruises or suspicious lesions Neurologic: Grossly intact, no focal deficits, moving all 4 extremities Psychiatric: Normal mood and affect  Laboratory Data: Results for orders placed or performed  in visit on 12/24/19  Microscopic Examination   Urine  Result Value Ref Range   WBC, UA >30 (A) 0 - 5 /hpf   RBC 0-2 0 - 2 /hpf   Epithelial Cells (non renal) 0-10 0 - 10 /hpf   Renal Epithel, UA 0-10 (A) None seen /hpf   Bacteria, UA Many (A) None seen/Few  Urinalysis, Complete  Result Value Ref Range   Specific Gravity, UA 1.015 1.005 - 1.030   pH, UA 5.5 5.0 - 7.5   Color, UA Yellow Yellow   Appearance Ur Cloudy (A) Clear   Leukocytes,UA 2+ (A) Negative   Protein,UA Negative Negative/Trace   Glucose, UA Negative Negative   Ketones, UA Negative Negative   RBC, UA Trace (A) Negative   Bilirubin, UA Negative Negative   Urobilinogen, Ur 0.2 0.2 - 1.0 mg/dL   Nitrite, UA Negative Negative   Microscopic Examination See below:    Assessment & Plan:   1. Dysuria UA today notable for pyuria and bacteriuria, will send for culture for further evaluation and treat as indicated. - Urinalysis, Complete - CULTURE, URINE COMPREHENSIVE  2. OAB (overactive bladder) Incomplete versus absent therapeutic response on Myrbetriq and Vesicare.  I counseled the patient on PTNS as an alternative treatment.  I explained that 50 to 80% of patients observed symptomatic improvement on PTNS.  I explained that in her case, this does not necessarily mean that we would be able to get her dry, but rather that we may be able to reduce her degree of urinary leakage.  She is eager to try this treatment and verbalizes that her treatment goal would be decreased urinary leakage over baseline.  We will schedule PTNS today.  Return in about 2 weeks (around 01/07/2020) for PTNS.  Debroah Loop, PA-C  [facility] [address] [place], [place] [address] [phone]

## 2019-12-27 ENCOUNTER — Telehealth: Payer: Self-pay

## 2019-12-27 LAB — CULTURE, URINE COMPREHENSIVE

## 2019-12-27 MED ORDER — SULFAMETHOXAZOLE-TRIMETHOPRIM 800-160 MG PO TABS: 1.0000 | ORAL_TABLET | Freq: Two times a day (BID) | ORAL | 0 refills | Status: DC

## 2019-12-27 NOTE — Telephone Encounter (Signed)
Medication sent to pharmacy, mychart notification sent

## 2019-12-27 NOTE — Telephone Encounter (Signed)
-----   Message from Nori Riis, PA-C sent at 12/27/2019  9:58 AM EDT ----- Please let Mrs. Grilli know that her urine culture is positive for infection and we need to start her on Septra DS twice daily for 7 days

## 2020-01-07 ENCOUNTER — Other Ambulatory Visit: Payer: Self-pay

## 2020-01-07 ENCOUNTER — Ambulatory Visit (INDEPENDENT_AMBULATORY_CARE_PROVIDER_SITE_OTHER): Payer: Medicare Other | Admitting: *Deleted

## 2020-01-07 DIAGNOSIS — N3281 Overactive bladder: Secondary | ICD-10-CM

## 2020-01-07 NOTE — Progress Notes (Signed)
PTNS  Session # 1  Health & Social Factors: good Caffeine: 3 Alcohol: 0 Daytime voids #per day: 15 Night-time voids #per night: 0 Urgency: severe` Incontinence Episodes #per day: 6 Ankle used: right Treatment Setting: 5 Feeling/ Response: toe flex  Performed By: Gaspar Cola CMA    Patient watch video and sign papers.   Follow Up: 1 week

## 2020-01-08 DIAGNOSIS — H4063X0 Glaucoma secondary to drugs, bilateral, stage unspecified: Secondary | ICD-10-CM | POA: Diagnosis not present

## 2020-01-14 ENCOUNTER — Ambulatory Visit (INDEPENDENT_AMBULATORY_CARE_PROVIDER_SITE_OTHER): Payer: Medicare Other | Admitting: *Deleted

## 2020-01-14 ENCOUNTER — Other Ambulatory Visit: Payer: Self-pay

## 2020-01-14 DIAGNOSIS — N3281 Overactive bladder: Secondary | ICD-10-CM

## 2020-01-14 NOTE — Progress Notes (Signed)
PTNS  Session # 2  Health & Social Factors: good Caffeine: 3 Alcohol: 0 Daytime voids #per day: 15 Night-time voids #per night: 4-6 Urgency: severe` Incontinence Episodes #per day: 6 Ankle used: right Treatment Setting: 4 Feeling/ Response: toe flex  Performed By: Gaspar Cola CMA     Follow Up: 1 week

## 2020-01-15 DIAGNOSIS — H4063X0 Glaucoma secondary to drugs, bilateral, stage unspecified: Secondary | ICD-10-CM | POA: Diagnosis not present

## 2020-01-20 ENCOUNTER — Encounter: Payer: Self-pay | Admitting: Urology

## 2020-01-20 ENCOUNTER — Other Ambulatory Visit: Payer: Self-pay

## 2020-01-20 ENCOUNTER — Other Ambulatory Visit: Payer: Self-pay | Admitting: Urology

## 2020-01-20 ENCOUNTER — Telehealth: Payer: Self-pay | Admitting: Family Medicine

## 2020-01-20 ENCOUNTER — Ambulatory Visit (INDEPENDENT_AMBULATORY_CARE_PROVIDER_SITE_OTHER): Payer: Medicare Other | Admitting: Urology

## 2020-01-20 VITALS — BP 117/78 | HR 84 | Ht 60.0 in | Wt 109.0 lb

## 2020-01-20 DIAGNOSIS — N3281 Overactive bladder: Secondary | ICD-10-CM

## 2020-01-20 DIAGNOSIS — Z87440 Personal history of urinary (tract) infections: Secondary | ICD-10-CM

## 2020-01-20 DIAGNOSIS — R7309 Other abnormal glucose: Secondary | ICD-10-CM

## 2020-01-20 DIAGNOSIS — E785 Hyperlipidemia, unspecified: Secondary | ICD-10-CM

## 2020-01-20 DIAGNOSIS — D494 Neoplasm of unspecified behavior of bladder: Secondary | ICD-10-CM

## 2020-01-20 DIAGNOSIS — I10 Essential (primary) hypertension: Secondary | ICD-10-CM

## 2020-01-20 MED ORDER — SULFAMETHOXAZOLE-TRIMETHOPRIM 800-160 MG PO TABS
1.0000 | ORAL_TABLET | Freq: Once | ORAL | Status: AC
  Administered 2020-01-20: 1 via ORAL

## 2020-01-20 NOTE — Progress Notes (Signed)
Cystoscopy Procedure Note:  Indication: Hx of bladder cancer  Victoria Allison is a 79 y.o. female moderate to severe OAB symptoms and urge incontinence followed by Dr. Matilde Sprang and was found to have a small bladder tumor in January 2021 and underwent biopsy and fulguration.   Initial Diagnosis of Bladdercancer 03/22/2019: Biopsy and fulguration of 17mm papillary tumor medial to right ureteral orifice, HG Ta. Gemcitabine post-op.  Treatments for Bladder Cancer She deferred 2nd look TURBT and intravesical treatment 2/2 co-morbidities  AUA Risk Category Intermediate  Cystoscopy Procedure Note: After informed consent and discussion of the procedure and its risks, Victoria Allison was positioned and prepped in the standard fashion. Cystoscopy was performed with the a flexible cystoscope.  Significantly atrophic introitus and urethra makes cystoscopy challenging.    Thorough cystoscopy performed and no recurrence of tumor seen.  No abnormalities on retroflexion.  With her comorbidities and very small lesion initially at diagnosis, using shared decision making we opted to continue surveillance cystoscopy at a 1 year interval.  Continue follow-up with MacDiarmid re:OAB, recently started PTNS RTC 1 year for cystoscopy  Nickolas Madrid, MD 01/20/2020

## 2020-01-20 NOTE — Telephone Encounter (Signed)
-----   Message from Cloyd Stagers, RT sent at 01/13/2020  2:31 PM EST ----- Regarding: Lab Orders for Tuesday 11.23.2021 Please place lab orders for Tuesday 11.23.2021, office visit for physical on Monday 11.29.2021 Thank you, Dyke Maes RT(R)

## 2020-01-21 ENCOUNTER — Other Ambulatory Visit: Payer: Self-pay

## 2020-01-21 ENCOUNTER — Other Ambulatory Visit (INDEPENDENT_AMBULATORY_CARE_PROVIDER_SITE_OTHER): Payer: Medicare Other

## 2020-01-21 ENCOUNTER — Ambulatory Visit (INDEPENDENT_AMBULATORY_CARE_PROVIDER_SITE_OTHER): Payer: Medicare Other

## 2020-01-21 DIAGNOSIS — N3281 Overactive bladder: Secondary | ICD-10-CM

## 2020-01-21 DIAGNOSIS — E785 Hyperlipidemia, unspecified: Secondary | ICD-10-CM

## 2020-01-21 DIAGNOSIS — I10 Essential (primary) hypertension: Secondary | ICD-10-CM

## 2020-01-21 DIAGNOSIS — R7309 Other abnormal glucose: Secondary | ICD-10-CM | POA: Diagnosis not present

## 2020-01-21 LAB — URINALYSIS, COMPLETE
Bilirubin, UA: NEGATIVE
Glucose, UA: NEGATIVE
Ketones, UA: NEGATIVE
Nitrite, UA: NEGATIVE
Protein,UA: NEGATIVE
RBC, UA: NEGATIVE
Specific Gravity, UA: 1.02 (ref 1.005–1.030)
Urobilinogen, Ur: 1 mg/dL (ref 0.2–1.0)
pH, UA: 6.5 (ref 5.0–7.5)

## 2020-01-21 LAB — COMPREHENSIVE METABOLIC PANEL
ALT: 27 U/L (ref 0–35)
AST: 21 U/L (ref 0–37)
Albumin: 3.7 g/dL (ref 3.5–5.2)
Alkaline Phosphatase: 99 U/L (ref 39–117)
BUN: 8 mg/dL (ref 6–23)
CO2: 28 mEq/L (ref 19–32)
Calcium: 8.8 mg/dL (ref 8.4–10.5)
Chloride: 97 mEq/L (ref 96–112)
Creatinine, Ser: 0.85 mg/dL (ref 0.40–1.20)
GFR: 64.22 mL/min (ref >60.00)
Glucose, Bld: 91 mg/dL (ref 70–99)
Potassium: 4.4 mEq/L (ref 3.5–5.1)
Sodium: 130 mEq/L — ABNORMAL LOW (ref 135–145)
Total Bilirubin: 0.4 mg/dL (ref 0.2–1.2)
Total Protein: 6.7 g/dL (ref 6.0–8.3)

## 2020-01-21 LAB — LIPID PANEL
Cholesterol: 154 mg/dL (ref 0–200)
HDL: 52.3 mg/dL (ref >39.00)
LDL Cholesterol: 80 mg/dL (ref 0–99)
NonHDL: 101.99
Total CHOL/HDL Ratio: 3
Triglycerides: 111 mg/dL (ref 0.0–149.0)
VLDL: 22.2 mg/dL (ref 0.0–40.0)

## 2020-01-21 LAB — CBC WITH DIFFERENTIAL/PLATELET
Basophils Absolute: 0 10*3/uL (ref 0.0–0.1)
Basophils Relative: 0.7 % (ref 0.0–3.0)
Eosinophils Absolute: 0.1 10*3/uL (ref 0.0–0.7)
Eosinophils Relative: 1.9 % (ref 0.0–5.0)
HCT: 36.6 % (ref 36.0–46.0)
Hemoglobin: 12.2 g/dL (ref 12.0–15.0)
Lymphocytes Relative: 36.9 % (ref 12.0–46.0)
Lymphs Abs: 1.9 10*3/uL (ref 0.7–4.0)
MCHC: 33.3 g/dL (ref 30.0–36.0)
MCV: 84.3 fl (ref 78.0–100.0)
Monocytes Absolute: 0.3 10*3/uL (ref 0.1–1.0)
Monocytes Relative: 5.1 % (ref 3.0–12.0)
Neutro Abs: 2.9 10*3/uL (ref 1.4–7.7)
Neutrophils Relative %: 55.4 % (ref 43.0–77.0)
Platelets: 157 10*3/uL (ref 150.0–400.0)
RBC: 4.34 Mil/uL (ref 3.87–5.11)
RDW: 14.6 % (ref 11.5–15.5)
WBC: 5.2 10*3/uL (ref 4.0–10.5)

## 2020-01-21 LAB — TSH: TSH: 3.01 u[IU]/mL (ref 0.35–4.50)

## 2020-01-21 LAB — HEMOGLOBIN A1C: Hgb A1c MFr Bld: 6 % (ref 4.6–6.5)

## 2020-01-21 LAB — MICROSCOPIC EXAMINATION: Bacteria, UA: NONE SEEN

## 2020-01-21 NOTE — Progress Notes (Signed)
PTNS  Session # 3  Health & Social Factors: no change Caffeine: 3 Alcohol: 0 Daytime voids #per day: 10-15 Night-time voids #per night: 2-3 Urgency: strong Incontinence Episodes #per day: 5 Ankle used: right Treatment Setting: 1 Feeling/ Response: both Comments: patient tolerated well  Performed By: Alva Garnet   Follow Up: 1wk

## 2020-01-21 NOTE — Patient Instructions (Signed)
Tracking Your Bladder Symptoms    Patient Name:___________________________________________________   Sample: Day   Daytime Voids  Nighttime Voids Urgency for the Day(0-4) Number of Accidents Beverage Comments  Monday IIII II 2 I Water IIII Coffee  I      Week Starting:____________________________________   Day Daytime  Voids Nighttime  Voids Urgency for  The Day(0-4) Number of Accidents Beverages Comments                                                           This week my symptoms were:  O much better  O better O the same O worse   

## 2020-01-27 ENCOUNTER — Emergency Department
Admission: EM | Admit: 2020-01-27 | Discharge: 2020-01-27 | Disposition: A | Discharge status: Home / Self Care | Payer: Medicare Other | Attending: Emergency Medicine | Admitting: Emergency Medicine

## 2020-01-27 ENCOUNTER — Encounter: Payer: Self-pay | Admitting: Intensive Care

## 2020-01-27 ENCOUNTER — Other Ambulatory Visit: Payer: Self-pay

## 2020-01-27 ENCOUNTER — Encounter: Payer: Medicare Other | Admitting: Family Medicine

## 2020-01-27 ENCOUNTER — Encounter: Payer: Self-pay | Admitting: Family Medicine

## 2020-01-27 ENCOUNTER — Emergency Department: Payer: Medicare Other

## 2020-01-27 DIAGNOSIS — E860 Dehydration: Secondary | ICD-10-CM | POA: Diagnosis not present

## 2020-01-27 DIAGNOSIS — Z87891 Personal history of nicotine dependence: Secondary | ICD-10-CM | POA: Diagnosis not present

## 2020-01-27 DIAGNOSIS — N390 Urinary tract infection, site not specified: Secondary | ICD-10-CM | POA: Diagnosis not present

## 2020-01-27 DIAGNOSIS — I2510 Atherosclerotic heart disease of native coronary artery without angina pectoris: Secondary | ICD-10-CM | POA: Insufficient documentation

## 2020-01-27 DIAGNOSIS — J9811 Atelectasis: Secondary | ICD-10-CM | POA: Diagnosis not present

## 2020-01-27 DIAGNOSIS — E871 Hypo-osmolality and hyponatremia: Secondary | ICD-10-CM | POA: Diagnosis not present

## 2020-01-27 DIAGNOSIS — I129 Hypertensive chronic kidney disease with stage 1 through stage 4 chronic kidney disease, or unspecified chronic kidney disease: Secondary | ICD-10-CM | POA: Insufficient documentation

## 2020-01-27 DIAGNOSIS — Z9861 Coronary angioplasty status: Secondary | ICD-10-CM | POA: Insufficient documentation

## 2020-01-27 DIAGNOSIS — R079 Chest pain, unspecified: Secondary | ICD-10-CM | POA: Diagnosis not present

## 2020-01-27 DIAGNOSIS — Z79899 Other long term (current) drug therapy: Secondary | ICD-10-CM | POA: Insufficient documentation

## 2020-01-27 DIAGNOSIS — N189 Chronic kidney disease, unspecified: Secondary | ICD-10-CM | POA: Diagnosis not present

## 2020-01-27 DIAGNOSIS — R410 Disorientation, unspecified: Secondary | ICD-10-CM | POA: Diagnosis not present

## 2020-01-27 DIAGNOSIS — Z85828 Personal history of other malignant neoplasm of skin: Secondary | ICD-10-CM | POA: Insufficient documentation

## 2020-01-27 DIAGNOSIS — Z91040 Latex allergy status: Secondary | ICD-10-CM | POA: Diagnosis not present

## 2020-01-27 DIAGNOSIS — R531 Weakness: Secondary | ICD-10-CM | POA: Diagnosis not present

## 2020-01-27 LAB — LIPASE, BLOOD: Lipase: 22 U/L (ref 11–51)

## 2020-01-27 LAB — COMPREHENSIVE METABOLIC PANEL
ALT: 24 U/L (ref 0–44)
AST: 25 U/L (ref 15–41)
Albumin: 3.4 g/dL — ABNORMAL LOW (ref 3.5–5.0)
Alkaline Phosphatase: 102 U/L (ref 38–126)
Anion gap: 10 (ref 5–15)
BUN: 14 mg/dL (ref 8–23)
CO2: 24 mmol/L (ref 22–32)
Calcium: 8.9 mg/dL (ref 8.9–10.3)
Chloride: 95 mmol/L — ABNORMAL LOW (ref 98–111)
Creatinine, Ser: 0.82 mg/dL (ref 0.44–1.00)
GFR, Estimated: >60 mL/min (ref >60)
Glucose, Bld: 135 mg/dL — ABNORMAL HIGH (ref 70–99)
Potassium: 3.7 mmol/L (ref 3.5–5.1)
Sodium: 129 mmol/L — ABNORMAL LOW (ref 135–145)
Total Bilirubin: 0.9 mg/dL (ref 0.3–1.2)
Total Protein: 7.3 g/dL (ref 6.5–8.1)

## 2020-01-27 LAB — URINALYSIS, COMPLETE (UACMP) WITH MICROSCOPIC
Bilirubin Urine: NEGATIVE
Glucose, UA: NEGATIVE mg/dL
Ketones, ur: NEGATIVE mg/dL
Nitrite: NEGATIVE
Protein, ur: 100 mg/dL — AB
Specific Gravity, Urine: 1.021 (ref 1.005–1.030)
pH: 5 (ref 5.0–8.0)

## 2020-01-27 LAB — CBC
HCT: 35.6 % — ABNORMAL LOW (ref 36.0–46.0)
Hemoglobin: 11.8 g/dL — ABNORMAL LOW (ref 12.0–15.0)
MCH: 28.4 pg (ref 26.0–34.0)
MCHC: 33.1 g/dL (ref 30.0–36.0)
MCV: 85.6 fL (ref 80.0–100.0)
Platelets: 210 10*3/uL (ref 150–400)
RBC: 4.16 MIL/uL (ref 3.87–5.11)
RDW: 13.9 % (ref 11.5–15.5)
WBC: 11 10*3/uL — ABNORMAL HIGH (ref 4.0–10.5)
nRBC: 0 % (ref 0.0–0.2)

## 2020-01-27 LAB — TROPONIN I (HIGH SENSITIVITY)
Troponin I (High Sensitivity): 8 ng/L (ref <18)
Troponin I (High Sensitivity): 6 ng/L (ref <18)

## 2020-01-27 MED ORDER — PRAVASTATIN SODIUM 40 MG PO TABS: 40.0000 mg | ORAL_TABLET | Freq: Every day | ORAL | 0 refills | Status: AC

## 2020-01-27 MED ORDER — LISINOPRIL 10 MG PO TABS: 10.0000 mg | ORAL_TABLET | Freq: Every day | ORAL | 0 refills | Status: AC

## 2020-01-27 MED ORDER — OMEPRAZOLE 40 MG PO CPDR
40.0000 mg | DELAYED_RELEASE_CAPSULE | Freq: Every day | ORAL | 0 refills | Status: DC
Start: 2020-01-27 — End: 2020-03-03

## 2020-01-27 MED ORDER — SOLIFENACIN SUCCINATE 5 MG PO TABS: 5.0000 mg | ORAL_TABLET | Freq: Every day | ORAL | 11 refills | Status: DC

## 2020-01-27 MED ORDER — CEFDINIR 300 MG PO CAPS: 300.0000 mg | ORAL_CAPSULE | Freq: Two times a day (BID) | ORAL | 0 refills | Status: AC

## 2020-01-27 MED ORDER — SODIUM CHLORIDE 0.9 % IV BOLUS
1000.0000 mL | Freq: Once | INTRAVENOUS | Status: AC
  Administered 2020-01-27: 1000 mL via INTRAVENOUS

## 2020-01-27 NOTE — ED Notes (Signed)
Pt ambulated from bed to toilet with assistance. Pt requires occasional steadying, appears to occasionally have a decrease in balance

## 2020-01-27 NOTE — ED Provider Notes (Signed)
[facility] Emergency Department Provider Note   ____________________________________________   First MD Initiated Contact with Patient 01/27/20 1253     (approximate)  I have reviewed the triage vital signs and the nursing notes.   HISTORY  Chief Complaint Fall and Chest Pain    HPI Victoria Allison is a 79 y.o. female a significant past medical history of CKD, CAD, GERD, hypertension, and frequent urinary tract infections who presents for 2 separate occasions of lower extremity weakness causing a fall.  Patient states that she is in her normal state of health until suddenly she feels as though her legs are going to give out and she falls to the floor.  Today patient did not have enough energy to get herself after falling.  Patient denies any loss of consciousness and can recount to the entire fall without any significant trauma stating that she just "collapses".  Patient states that this is happened to her before whenever she has had a urinary tract infection.  Patient also describes some confusion and memory issues that are corroborated by patient's caregiver at bedside.  Patient only describes preceding symptoms of lightheadedness and some blurred vision that improves after she goes to the floor.  Patient denies any other preceding symptoms including chest pain, shortness of breath, palpitations, or numbness/paresthesias in any extremity.  Patient denies any recent medication changes         Past Medical History:  Diagnosis Date  . Allergic rhinitis   . CAD (coronary artery disease)   . Cancer ([place])    skin  . Chronic kidney disease    overactive bladder  . Colon polyp   . Diverticulosis    sigmoid and descending colon  . Elevated LFTs over one year ago  . Esophageal ring   . GERD (gastroesophageal reflux disease)   . History of hepatitis 1975   unsure of type  . Hyperlipemia   . Hypertension   . Internal hemorrhoids   . Lichen sclerosus 95/62     (DX BY GYN AFTER ABN PAP)  . Pemphigoid, cicatricial    D/O AFFECTING EYES, MOUTH, THROAT NOW (DX [facility])  . Shingles 03/13/2013  . Urinary incontinence    OVERACTIVE BLADDER    Patient Active Problem List   Diagnosis Date Noted  . Pre-operative examination 03/13/2019  . Hoarseness, persistent 10/30/2018  . Laryngitis, acute 04/11/2018  . Constipation 08/07/2017  . Screening mammogram, encounter for 07/19/2017  . Elevated glucose 07/19/2017  . Colon cancer screening 12/13/2013  . Dysphagia 04/12/2013  . Encounter for Medicare annual wellness exam 12/10/2012  . Bladder neoplasm of uncertain malignant potential 05/09/2012  . Chronic cystitis 05/09/2012  . 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 Incomplete emptying of bladder 05/09/2012  . Symptoms involving urinary system 05/09/2012  . Esophageal rings 08/24/2011  . Personal history of malignant neoplasm of skin 08/01/2011  . Squamous cell carcinoma of skin of face 01/17/2011  . Neoplasm of connective tissue 12/06/2010  . History of supraventricular tachycardia 09/22/2009  . ARTHRITIS, SHOULDER 05/21/2009  . INSOMNIA 02/05/2009  . POSTMENOPAUSAL STATUS 06/02/2008  . PEMPHIGOID 11/14/2007  . Nonspecific (abnormal) findings on radiological and other examination of body structure 07/05/2007  . Hyperlipidemia LDL goal <100 03/13/2007  . Essential hypertension 03/13/2007  . Coronary atherosclerosis 03/13/2007  . Allergic rhinitis 03/13/2007  . GERD 03/13/2007  . HAIR LOSS 03/13/2007  . LEG PAIN, CHRONIC 03/13/2007  . URINARY INCONTINENCE 03/13/2007  . COLONIC POLYPS, HX OF 03/13/2007  .  HX, URINARY INFECTION 08/14/2006    Past Surgical History:  Procedure Laterality Date  . ABDOMINAL HYSTERECTOMY    . ANGIOPLASTY  yrs ago   LAD  . CARDIAC ELECTROPHYSIOLOGY MAPPING AND ABLATION    . CATARACT EXTRACTION Bilateral   . COLONOSCOPY    . CYSTOSCOPY WITH BIOPSY N/A 03/22/2019   Procedure: CYSTOSCOPY WITH BLADDER BIOPSY WITH  GEMCITABINE;  Surgeon: Billey Co, MD;  Location: [facility] ORS;  Service: Urology;  Laterality: N/A;  . CYSTOSCOPY WITH FULGERATION N/A 03/22/2019   Procedure: CYSTOSCOPY WITH FULGERATION;  Surgeon: Billey Co, MD;  Location: [facility];  Service: Urology;  Laterality: N/A;  . ESOPHAGOGASTRODUODENOSCOPY  09/14/2011   Procedure: ESOPHAGOGASTRODUODENOSCOPY (EGD);  Surgeon: Gatha Mayer, MD;  Location: [facility];  Service: Endoscopy;  Laterality: N/A;  needs xray  . ESOPHAGOGASTRODUODENOSCOPY N/A 01/02/2013   Procedure: ESOPHAGOGASTRODUODENOSCOPY (EGD);  Surgeon: Gatha Mayer, MD;  Location: [facility];  Service: Endoscopy;  Laterality: N/A;  . ESOPHAGOGASTRODUODENOSCOPY N/A 04/12/2013   Procedure: ESOPHAGOGASTRODUODENOSCOPY (EGD);  Surgeon: Gatha Mayer, MD;  Location: [facility];  Service: Endoscopy;  Laterality: N/A;  . ESOPHAGOGASTRODUODENOSCOPY (EGD) WITH PROPOFOL N/A 05/24/2016   Procedure: ESOPHAGOGASTRODUODENOSCOPY (EGD) WITH PROPOFOL;  Surgeon: Gatha Mayer, MD;  Location: [facility];  Service: Endoscopy;  Laterality: N/A;  . ESOPHAGOGASTRODUODENOSCOPY (EGD) WITH PROPOFOL N/A 08/22/2017   Procedure: ESOPHAGOGASTRODUODENOSCOPY (EGD) WITH PROPOFOL;  Surgeon: Gatha Mayer, MD;  Location: [facility];  Service: Endoscopy;  Laterality: N/A;  . ESOPHAGOGASTRODUODENOSCOPY (EGD) WITH PROPOFOL N/A 02/26/2019   Procedure: ESOPHAGOGASTRODUODENOSCOPY (EGD) WITH PROPOFOL;  Surgeon: Gatha Mayer, MD;  Location: [facility];  Service: Endoscopy;  Laterality: N/A;  . Venia Minks DILATION N/A 01/02/2013   Procedure: Venia Minks DILATION;  Surgeon: Gatha Mayer, MD;  Location: [facility];  Service: Endoscopy;  Laterality: N/A;  . Venia Minks DILATION N/A 05/24/2016   Procedure: Venia Minks DILATION;  Surgeon: Gatha Mayer, MD;  Location: [facility];  Service: Endoscopy;  Laterality: N/A;  . Venia Minks DILATION N/A 08/22/2017   Procedure: Venia Minks DILATION;  Surgeon: Gatha Mayer, MD;  Location:  [facility];  Service: Endoscopy;  Laterality: N/A;  Venia Minks DILATION  02/26/2019   Procedure: Venia Minks DILATION;  Surgeon: Gatha Mayer, MD;  Location: [facility];  Service: Endoscopy;;  . NOSE SURGERY     Biopsy  . OOPHORECTOMY    . SAVORY DILATION  09/14/2011   Procedure: SAVORY DILATION;  Surgeon: Gatha Mayer, MD;  Location: [facility];  Service: Endoscopy;  Laterality: N/A;  . SAVORY DILATION N/A 01/02/2013   Procedure: SAVORY DILATION;  Surgeon: Gatha Mayer, MD;  Location: [facility];  Service: Endoscopy;  Laterality: N/A;  . SAVORY DILATION N/A 04/12/2013   Procedure: SAVORY DILATION;  Surgeon: Gatha Mayer, MD;  Location: [facility];  Service: Endoscopy;  Laterality: N/A;  . skin cancer removal  02/2016    Prior to Admission medications   Medication Sig Start Date End Date Taking? Authorizing Provider  acetaminophen (TYLENOL) 500 MG tablet Take 500 mg by mouth daily as needed for moderate pain or headache.     [provider]  bimatoprost (LUMIGAN) 0.03 % ophthalmic solution 1 drop at bedtime.    [provider]  cefdinir (OMNICEF) 300 MG capsule Take 1 capsule (300 mg total) by mouth 2 (two) times daily for 5 days. 01/27/20 02/01/20  Naaman Plummer, MD  dexamethasone 0.5 MG/5ML elixir Take by mouth. 12/23/19   [provider]  fluticasone (FLONASE) 50  MCG/ACT nasal spray Place 2 sprays into both nostrils daily as needed for allergies. Patient taking differently: Place 2 sprays into both nostrils daily.  07/19/17   Tower, Wynelle Fanny, MD  lisinopril (ZESTRIL) 10 MG tablet Take 1 tablet (10 mg total) by mouth daily. 01/27/20   Naaman Plummer, MD  omeprazole (PRILOSEC) 40 MG capsule Take 1 capsule (40 mg total) by mouth daily. 01/27/20   Naaman Plummer, MD  pravastatin (PRAVACHOL) 40 MG tablet Take 1 tablet (40 mg total) by mouth daily. 01/27/20   Naaman Plummer, MD  prednisoLONE acetate (OMNIPRED) 1 % ophthalmic suspension Place 1 drop into  both eyes daily.     [provider]  sodium chloride (OCEAN) 0.65 % SOLN nasal spray Place 1 spray into both nostrils daily as needed for congestion.     [provider]  solifenacin (VESICARE) 5 MG tablet Take 1 tablet (5 mg total) by mouth daily. 01/27/20   Naaman Plummer, MD  timolol (TIMOPTIC) 0.5 % ophthalmic solution 1 drop 2 (two) times daily. 11/29/19   [provider]    Allergies Adhesive [tape], Aspirin, Latex, Lipitor [atorvastatin], Tetanus toxoid, and Ultram [tramadol]  Family History  Problem Relation Age of Onset  . Lung cancer Father   . Esophageal cancer Son 66  . Stomach cancer Son 63  . Stroke Mother        CVA  . Alzheimer's disease Mother   . Hypertension Brother   . Breast cancer Maternal Aunt   . Colon cancer Neg Hx   . Rectal cancer Neg Hx   . Colon polyps Neg Hx     Social History Social History   Tobacco Use  . Smoking status: Former Smoker    Quit date: 02/29/1968    Years since quitting: 51.9  . Smokeless tobacco: Never Used  Vaping Use  . Vaping Use: Never used  Substance Use Topics  . Alcohol use: No    Alcohol/week: 0.0 standard drinks  . Drug use: No    Review of Systems Constitutional: No fever/chills Eyes: No visual changes. ENT: No sore throat. Cardiovascular: Endorses chest pain to the lower anterior lateral left portion of the rib cage. Respiratory: Denies shortness of breath. Gastrointestinal: No abdominal pain.  No nausea, no vomiting.  No diarrhea. Genitourinary: Negative for dysuria. Musculoskeletal: Negative for acute arthralgias Skin: Negative for rash. Neurological: Negative for headaches, weakness/numbness/paresthesias in any extremity Psychiatric: Negative for suicidal ideation/homicidal ideation   ____________________________________________   PHYSICAL EXAM:  VITAL SIGNS: ED Triage Vitals  Enc Vitals Group     BP 01/27/20 1125 135/63     Pulse Rate 01/27/20 1125 94     Resp  01/27/20 1125 16     Temp 01/27/20 1125 99 F (37.2 C)     Temp Source 01/27/20 1125 Oral     SpO2 01/27/20 1125 94 %     Weight 01/27/20 1127 107 lb (48.5 kg)     Height 01/27/20 1127 5' (1.524 m)     Head Circumference --      Peak Flow --      Pain Score 01/27/20 1127 0     Pain Loc --      Pain Edu? --      Excl. in [place]? --    Constitutional: Alert and oriented. Well appearing and in no acute distress. Eyes: Conjunctivae are normal. PERRL. Head: Atraumatic. Nose: No congestion/rhinnorhea. Mouth/Throat: Mucous membranes are moist. Neck: No stridor Cardiovascular: Grossly  normal heart sounds.  Good peripheral circulation. Respiratory: Normal respiratory effort.  No retractions. Gastrointestinal: Soft and nontender. No distention. Musculoskeletal: No obvious deformities Neurologic:  Normal speech and language. No gross focal neurologic deficits are appreciated. Skin:  Skin is warm and dry. No rash noted. Psychiatric: Mood and affect are normal. Speech and behavior are normal.  ____________________________________________   LABS (all labs ordered are listed, but only abnormal results are displayed)  Labs Reviewed  COMPREHENSIVE METABOLIC PANEL - Abnormal; Notable for the following components:      Result Value   Sodium 129 (*)    Chloride 95 (*)    Glucose, Bld 135 (*)    Albumin 3.4 (*)    All other components within normal limits  CBC - Abnormal; Notable for the following components:   WBC 11.0 (*)    Hemoglobin 11.8 (*)    HCT 35.6 (*)    All other components within normal limits  URINALYSIS, COMPLETE (UACMP) WITH MICROSCOPIC - Abnormal; Notable for the following components:   Color, Urine AMBER (*)    APPearance HAZY (*)    Hgb urine dipstick SMALL (*)    Protein, ur 100 (*)    Leukocytes,Ua TRACE (*)    Bacteria, UA RARE (*)    All other components within normal limits  URINE CULTURE  LIPASE, BLOOD  TROPONIN I (HIGH SENSITIVITY)    ____________________________________________  EKG  ED ECG REPORT I, Naaman Plummer, the attending physician, personally viewed and interpreted this ECG.  Date: 01/27/2020 EKG Time: 1126 Rate: 94 Rhythm: normal sinus rhythm QRS Axis: normal Intervals: normal ST/T Wave abnormalities: normal Narrative Interpretation: no evidence of acute ischemia  ____________________________________________  RADIOLOGY  ED MD interpretation: Three-view x-ray of the left ribs does not show any evidence of acute fracture, dislocation, pneumothorax  Official radiology report(s): DG Ribs Unilateral W/Chest Left  Result Date: 01/27/2020 CLINICAL DATA:  Fall, left anterior rib cage pain EXAM: LEFT RIBS AND CHEST - 3+ VIEW COMPARISON:  04/17/2019 chest radiograph FINDINGS: No fracture or other bone lesions are seen involving the ribs. There is no evidence of pneumothorax or pleural effusion. Mild left basilar atelectasis. Heart size and mediastinal contours are within normal limits. IMPRESSION: No acute left rib fracture. Mild left basilar atelectasis. Electronically Signed   By: Macy Mis M.D.   On: 01/27/2020 14:05    ____________________________________________   PROCEDURES  Procedure(s) performed (including Critical Care):  .1-3 Lead EKG Interpretation Performed by: Naaman Plummer, MD Authorized by: Naaman Plummer, MD     Interpretation: normal     ECG rate:  86   ECG rate assessment: normal     Rhythm: sinus rhythm     Ectopy: none     Conduction: normal       ____________________________________________   INITIAL IMPRESSION / ASSESSMENT AND PLAN / ED COURSE  As part of my medical decision making, I reviewed the following data within the [place] notes reviewed and incorporated, Labs reviewed, EKG interpreted, Old chart reviewed, Radiograph reviewed and Notes from prior ED visits reviewed and incorporated      Patient is an 79 year old female  with above-stated past medical history the presents after multiple falls in the setting of bilateral lower extremity weakness. Patient does show evidence of mild hyponatremia and bacteria on micro for her UA concerning for uncomplicated cystitis  Not Pregnant. Unlikely TOA, Ovarian Torsion, PID, gonorrhea/chlamydia. Low suspicion for Infected Urolithiasis, AAA, Cholecystitis, Pancreatitis, SBO, Appendicitis, or  other acute abdomen.  Rx: Cefdinir 300 mg BID for 5 days Disposition: Discharge home. SRP discussed. Advise follow up with primary care provider within 24-72 hours.     ____________________________________________   FINAL CLINICAL IMPRESSION(S) / ED DIAGNOSES  Final diagnoses:  Lower urinary tract infectious disease  Dehydration  Hyponatremia  Episode of generalized weakness     ED Discharge Orders         Ordered    omeprazole (PRILOSEC) 40 MG capsule  Daily        01/27/20 1417    lisinopril (ZESTRIL) 10 MG tablet  Daily        01/27/20 1417    pravastatin (PRAVACHOL) 40 MG tablet  Daily        01/27/20 1417    solifenacin (VESICARE) 5 MG tablet  Daily        01/27/20 1417    cefdinir (OMNICEF) 300 MG capsule  2 times daily        01/27/20 1417           Note:  This document was prepared using Dragon voice recognition software and may include unintentional dictation errors.   Naaman Plummer, MD 01/27/20 [phone]

## 2020-01-27 NOTE — ED Notes (Signed)
Some tenderness to left ribs after fall last week

## 2020-01-27 NOTE — ED Notes (Signed)
Pt with one fall last week, with bruising to left forearm. Pt daughter reports pt has been tender to left lumbar and left leg, below hip  Pt with one fall today, states "I felt like I was going to pass out, and my legs gave out." Pt remembers fall, no LOC  Pt tolerates standing from wheelchair (needs assistance) and moving to bed. Pt denies pain with ambulation to bed. Walks without assist. Able to position self in bed, given some time.   Pt reports "trouble using the bathroom the past couple days, but I went after I fell."  Pt hard of hearing, but a&o x4

## 2020-01-27 NOTE — TOC Transition Note (Signed)
Transition of Care [facility]) - CM/SW Discharge Note   Patient Details  Name: Victoria Allison MRN: [id number] Date of Birth: 07/27/1940  [facility] CM/SW Contact:  Victorino Dike, RN Phone Number: 01/27/2020, 3:07 PM   Clinical Narrative:     Met with patient and daughter.  They report patient has had two falls recently and appears weaker than normal.  Patient to start antibiotic therapy and continue with Home Health PT and OT services provided by [place].   Final next level of care: [place] Barriers to Discharge: No Barriers Identified   Patient Goals and CMS Choice Patient states their goals for this hospitalization and ongoing recovery are:: to get stronger and to not fall down CMS Medicare.gov Compare Post Acute Care list provided to:: Patient Choice offered to / list presented to : Patient  HH Arranged: PT, OT [facility] Agency: [place] [facility] Date [place]: 01/27/20 Time [place]: 6440 Representative spoke with at [place]: Floydene Flock

## 2020-01-27 NOTE — ED Triage Notes (Addendum)
Patient presents to ER from fall today. Reports her legs gave out and remembers fall. Also c/o fall last week.  A&O x4 during triage.

## 2020-01-28 ENCOUNTER — Telehealth: Payer: Self-pay | Admitting: Family Medicine

## 2020-01-28 ENCOUNTER — Ambulatory Visit: Payer: Self-pay

## 2020-01-28 NOTE — Telephone Encounter (Signed)
Verbal order given to Tiffany  °

## 2020-01-28 NOTE — Telephone Encounter (Signed)
Please verbally ok that order

## 2020-01-28 NOTE — Telephone Encounter (Signed)
A verbal approval for physical Therapy start of care on December 2nd. Not sure who needed to handle this . EM

## 2020-01-29 ENCOUNTER — Other Ambulatory Visit: Payer: Self-pay

## 2020-01-29 ENCOUNTER — Ambulatory Visit (INDEPENDENT_AMBULATORY_CARE_PROVIDER_SITE_OTHER): Payer: Medicare Other | Admitting: Family Medicine

## 2020-01-29 ENCOUNTER — Encounter: Payer: Self-pay | Admitting: Family Medicine

## 2020-01-29 VITALS — BP 126/68 | HR 96 | Temp 97.9°F | Ht 60.0 in | Wt 113.1 lb

## 2020-01-29 DIAGNOSIS — L129 Pemphigoid, unspecified: Secondary | ICD-10-CM

## 2020-01-29 DIAGNOSIS — E871 Hypo-osmolality and hyponatremia: Secondary | ICD-10-CM | POA: Diagnosis not present

## 2020-01-29 DIAGNOSIS — I2510 Atherosclerotic heart disease of native coronary artery without angina pectoris: Secondary | ICD-10-CM | POA: Diagnosis not present

## 2020-01-29 DIAGNOSIS — N3000 Acute cystitis without hematuria: Secondary | ICD-10-CM | POA: Diagnosis not present

## 2020-01-29 DIAGNOSIS — R490 Dysphonia: Secondary | ICD-10-CM

## 2020-01-29 DIAGNOSIS — I10 Essential (primary) hypertension: Secondary | ICD-10-CM | POA: Diagnosis not present

## 2020-01-29 LAB — URINE CULTURE

## 2020-01-29 LAB — POC URINALSYSI DIPSTICK (AUTOMATED)
Bilirubin, UA: NEGATIVE
Blood, UA: 50
Glucose, UA: NEGATIVE
Ketones, UA: NEGATIVE
Nitrite, UA: NEGATIVE
Protein, UA: NEGATIVE
Spec Grav, UA: 1.01 (ref 1.010–1.025)
Urobilinogen, UA: 0.2 E.U./dL
pH, UA: 6 (ref 5.0–8.0)

## 2020-01-29 MED ORDER — BENZONATATE 200 MG PO CAPS: 200.0000 mg | ORAL_CAPSULE | Freq: Three times a day (TID) | ORAL | 0 refills | Status: DC | PRN

## 2020-01-29 NOTE — Assessment & Plan Note (Signed)
Mild in ER during uti 139  Re check today  Increased fluids-good for uti but hope this will not lower sodium further  No symptoms

## 2020-01-29 NOTE — Assessment & Plan Note (Signed)
Unsure if chronic cough is linked  Enc to continue ppi  Tessalon px to try

## 2020-01-29 NOTE — Patient Instructions (Signed)
Drink fluids- water and gatorade   Let;s check urine again  Also re check sodium level   Use your walker  Follow through with physical therapy   Try the tessalon for cough as needed  If worse/ more productive or fever- call and let us know

## 2020-01-29 NOTE — Progress Notes (Signed)
Subjective:    Patient ID: Victoria Allison, female    DOB: 1940/05/20, 79 y.o.   MRN: [id number]  This visit occurred during the SARS-CoV-2 public health emergency.  Safety protocols were in place, including screening questions prior to the visit, additional usage of staff PPE, and extensive cleaning of exam room while observing appropriate contact time as indicated for disinfecting solutions.    HPI Pt presents for f/u of ED visit   Wt Readings from Last 3 Encounters:  01/29/20 113 lb 1 oz (51.3 kg)  01/27/20 107 lb (48.5 kg)  01/20/20 109 lb (49.4 kg)   22.08 kg/m  Pt was seen in the ER on 11/29 who presented with LE weakness causing a fall (legs gave out)  Also some confusion recently Labs showed evidence of mild hyponatremia  Lab Results  Component Value Date   CREATININE 0.82 01/27/2020   BUN 14 01/27/2020   NA 129 (L) 01/27/2020   K 3.7 01/27/2020   CL 95 (L) 01/27/2020   CO2 24 01/27/2020   Lab Results  Component Value Date   WBC 11.0 (H) 01/27/2020   HGB 11.8 (L) 01/27/2020   HCT 35.6 (L) 01/27/2020   MCV 85.6 01/27/2020   PLT 210 01/27/2020    Urine did show bacteria on microsc assessment  Was px cefdinir 300 mg bid for 5 d   EKG wnl  DG Ribs Unilateral W/Chest Left  Result Date: 01/27/2020 CLINICAL DATA:  Fall, left anterior rib cage pain EXAM: LEFT RIBS AND CHEST - 3+ VIEW COMPARISON:  04/17/2019 chest radiograph FINDINGS: No fracture or other bone lesions are seen involving the ribs. There is no evidence of pneumothorax or pleural effusion. Mild left basilar atelectasis. Heart size and mediastinal contours are within normal limits. IMPRESSION: No acute left rib fracture. Mild left basilar atelectasis. Electronically Signed   By: Macy Mis M.D.   On: 01/27/2020 14:05    troponins negative  Glucose 135 non fasting   Urine culture looked contaminated (multiple species)  Pt has a h/o chronic cystitis   She was tx with sulfa abx in October for e  coli uti  Using a walker since home  Feels some better since she came home  occ coughs   Drinking water and gatorade   Is set up for PT to come to her home   ua today Results for orders placed or performed in visit on 01/29/20  POCT Urinalysis Dipstick (Automated)  Result Value Ref Range   Color, UA Yellow    Clarity, UA Clear    Glucose, UA Negative Negative   Bilirubin, UA Negative    Ketones, UA Negative    Spec Grav, UA 1.010 1.010 - 1.025   Blood, UA 50 Ery/uL    pH, UA 6.0 5.0 - 8.0   Protein, UA Negative Negative   Urobilinogen, UA 0.2 0.2 or 1.0 E.U./dL   Nitrite, UA Negative    Leukocytes, UA Trace (A) Negative    Patient Active Problem List   Diagnosis Date Noted  . Hyponatremia 01/29/2020  . Pre-operative examination 03/13/2019  . UTI (urinary tract infection) 11/07/2018  . Hoarseness, persistent 10/30/2018  . Constipation 08/07/2017  . Screening mammogram, encounter for 07/19/2017  . Elevated glucose 07/19/2017  . Colon cancer screening 12/13/2013  . Dysphagia 04/12/2013  . Encounter for Medicare annual wellness exam 12/10/2012  . Bladder neoplasm of uncertain malignant potential 05/09/2012  . Chronic cystitis 05/09/2012  . 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 Incomplete  emptying of bladder 05/09/2012  . Symptoms involving urinary system 05/09/2012  . Esophageal rings 08/24/2011  . Personal history of malignant neoplasm of skin 08/01/2011  . Squamous cell carcinoma of skin of face 01/17/2011  . Neoplasm of connective tissue 12/06/2010  . History of supraventricular tachycardia 09/22/2009  . ARTHRITIS, SHOULDER 05/21/2009  . INSOMNIA 02/05/2009  . POSTMENOPAUSAL STATUS 06/02/2008  . PEMPHIGOID 11/14/2007  . Nonspecific (abnormal) findings on radiological and other examination of body structure 07/05/2007  . Hyperlipidemia LDL goal <100 03/13/2007  . Essential hypertension 03/13/2007  . Coronary atherosclerosis 03/13/2007  . Allergic rhinitis  03/13/2007  . GERD 03/13/2007  . HAIR LOSS 03/13/2007  . LEG PAIN, CHRONIC 03/13/2007  . URINARY INCONTINENCE 03/13/2007  . COLONIC POLYPS, HX OF 03/13/2007  . HX, URINARY INFECTION 08/14/2006   Past Medical History:  Diagnosis Date  . Allergic rhinitis   . CAD (coronary artery disease)   . Cancer ([place])    skin  . Chronic kidney disease    overactive bladder  . Colon polyp   . Diverticulosis    sigmoid and descending colon  . Elevated LFTs over one year ago  . Esophageal ring   . GERD (gastroesophageal reflux disease)   . History of hepatitis 1975   unsure of type  . Hyperlipemia   . Hypertension   . Internal hemorrhoids   . Lichen sclerosus 33/29   (DX BY GYN AFTER ABN PAP)  . Pemphigoid, cicatricial    D/O AFFECTING EYES, MOUTH, THROAT NOW (DX [facility])  . Shingles 03/13/2013  . Urinary incontinence    OVERACTIVE BLADDER   Past Surgical History:  Procedure Laterality Date  . ABDOMINAL HYSTERECTOMY    . ANGIOPLASTY  yrs ago   LAD  . CARDIAC ELECTROPHYSIOLOGY MAPPING AND ABLATION    . CATARACT EXTRACTION Bilateral   . COLONOSCOPY    . CYSTOSCOPY WITH BIOPSY N/A 03/22/2019   Procedure: CYSTOSCOPY WITH BLADDER BIOPSY WITH GEMCITABINE;  Surgeon: Billey Co, MD;  Location: [facility];  Service: Urology;  Laterality: N/A;  . CYSTOSCOPY WITH FULGERATION N/A 03/22/2019   Procedure: CYSTOSCOPY WITH FULGERATION;  Surgeon: Billey Co, MD;  Location: [facility];  Service: Urology;  Laterality: N/A;  . ESOPHAGOGASTRODUODENOSCOPY  09/14/2011   Procedure: ESOPHAGOGASTRODUODENOSCOPY (EGD);  Surgeon: Gatha Mayer, MD;  Location: [facility];  Service: Endoscopy;  Laterality: N/A;  needs xray  . ESOPHAGOGASTRODUODENOSCOPY N/A 01/02/2013   Procedure: ESOPHAGOGASTRODUODENOSCOPY (EGD);  Surgeon: Gatha Mayer, MD;  Location: [facility];  Service: Endoscopy;  Laterality: N/A;  . ESOPHAGOGASTRODUODENOSCOPY N/A 04/12/2013   Procedure: ESOPHAGOGASTRODUODENOSCOPY (EGD);  Surgeon: Gatha Mayer, MD;  Location: [facility];  Service: Endoscopy;  Laterality: N/A;  . ESOPHAGOGASTRODUODENOSCOPY (EGD) WITH PROPOFOL N/A 05/24/2016   Procedure: ESOPHAGOGASTRODUODENOSCOPY (EGD) WITH PROPOFOL;  Surgeon: Gatha Mayer, MD;  Location: [facility];  Service: Endoscopy;  Laterality: N/A;  . ESOPHAGOGASTRODUODENOSCOPY (EGD) WITH PROPOFOL N/A 08/22/2017   Procedure: ESOPHAGOGASTRODUODENOSCOPY (EGD) WITH PROPOFOL;  Surgeon: Gatha Mayer, MD;  Location: [facility];  Service: Endoscopy;  Laterality: N/A;  . ESOPHAGOGASTRODUODENOSCOPY (EGD) WITH PROPOFOL N/A 02/26/2019   Procedure: ESOPHAGOGASTRODUODENOSCOPY (EGD) WITH PROPOFOL;  Surgeon: Gatha Mayer, MD;  Location: [facility];  Service: Endoscopy;  Laterality: N/A;  . Venia Minks DILATION N/A 01/02/2013   Procedure: Venia Minks DILATION;  Surgeon: Gatha Mayer, MD;  Location: [facility];  Service: Endoscopy;  Laterality: N/A;  . Venia Minks DILATION N/A 05/24/2016   Procedure: Venia Minks DILATION;  Surgeon: Gatha Mayer, MD;  Location: [facility];  Service: Endoscopy;  Laterality: N/A;  . Venia Minks DILATION N/A 08/22/2017   Procedure: Venia Minks DILATION;  Surgeon: Gatha Mayer, MD;  Location: [facility];  Service: Endoscopy;  Laterality: N/A;  Venia Minks DILATION  02/26/2019   Procedure: Venia Minks DILATION;  Surgeon: Gatha Mayer, MD;  Location: [facility];  Service: Endoscopy;;  . NOSE SURGERY     Biopsy  . OOPHORECTOMY    . SAVORY DILATION  09/14/2011   Procedure: SAVORY DILATION;  Surgeon: Gatha Mayer, MD;  Location: [facility];  Service: Endoscopy;  Laterality: N/A;  . SAVORY DILATION N/A 01/02/2013   Procedure: SAVORY DILATION;  Surgeon: Gatha Mayer, MD;  Location: [facility];  Service: Endoscopy;  Laterality: N/A;  . SAVORY DILATION N/A 04/12/2013   Procedure: SAVORY DILATION;  Surgeon: Gatha Mayer, MD;  Location: [facility];  Service: Endoscopy;  Laterality: N/A;  . skin cancer removal  02/2016   Social History    Tobacco Use  . Smoking status: Former Smoker    Quit date: 02/29/1968    Years since quitting: 51.9  . Smokeless tobacco: Never Used  Vaping Use  . Vaping Use: Never used  Substance Use Topics  . Alcohol use: No    Alcohol/week: 0.0 standard drinks  . Drug use: No   Family History  Problem Relation Age of Onset  . Lung cancer Father   . Esophageal cancer Son 50  . Stomach cancer Son 51  . Stroke Mother        CVA  . Alzheimer's disease Mother   . Hypertension Brother   . Breast cancer Maternal Aunt   . Colon cancer Neg Hx   . Rectal cancer Neg Hx   . Colon polyps Neg Hx    Allergies  Allergen Reactions  . Adhesive [Tape] Rash    Paper tape and tegaderm OK  . Aspirin Other (See Comments)    Bleeds too easily  . Latex Itching and Rash  . Lipitor [Atorvastatin] Other (See Comments)    Elevated LFT's  . Tetanus Toxoid Rash  . Ultram [Tramadol] Other (See Comments)    Dizziness   Current Outpatient Medications on File Prior to Visit  Medication Sig Dispense Refill  . acetaminophen (TYLENOL) 500 MG tablet Take 500 mg by mouth daily as needed for moderate pain or headache.     . bimatoprost (LUMIGAN) 0.03 % ophthalmic solution 1 drop at bedtime.    . cefdinir (OMNICEF) 300 MG capsule Take 1 capsule (300 mg total) by mouth 2 (two) times daily for 5 days. 10 capsule 0  . dexamethasone 0.5 MG/5ML elixir Take by mouth.    . fluticasone (FLONASE) 50 MCG/ACT nasal spray Place 2 sprays into both nostrils daily as needed for allergies. (Patient taking differently: Place 2 sprays into both nostrils daily. ) 16 g 11  . lisinopril (ZESTRIL) 10 MG tablet Take 1 tablet (10 mg total) by mouth daily. 90 tablet 0  . omeprazole (PRILOSEC) 40 MG capsule Take 1 capsule (40 mg total) by mouth daily. 90 capsule 0  . pravastatin (PRAVACHOL) 40 MG tablet Take 1 tablet (40 mg total) by mouth daily. 90 tablet 0  . prednisoLONE acetate (OMNIPRED) 1 % ophthalmic suspension Place 1 drop into both  eyes daily.     . sodium chloride (OCEAN) 0.65 % SOLN nasal spray Place 1 spray into both nostrils daily as needed for congestion.     . solifenacin (VESICARE) 5 MG tablet Take  1 tablet (5 mg total) by mouth daily. 90 tablet 11  . timolol (TIMOPTIC) 0.5 % ophthalmic solution 1 drop 2 (two) times daily.     No current facility-administered medications on file prior to visit.    Review of Systems  Constitutional: Positive for fatigue. Negative for activity change, appetite change, fever and unexpected weight change.  HENT: Negative for congestion, ear pain, rhinorrhea, sinus pressure and sore throat.        Decreasing gum tissue due to pemphigoid   Eyes: Negative for pain, redness and visual disturbance.  Respiratory: Negative for cough, shortness of breath and wheezing.   Cardiovascular: Negative for chest pain and palpitations.  Gastrointestinal: Negative for abdominal pain, blood in stool, constipation and diarrhea.  Endocrine: Negative for polydipsia and polyuria.  Genitourinary: Negative for dysuria, frequency and urgency.  Musculoskeletal: Negative for arthralgias, back pain and myalgias.  Skin: Negative for pallor and rash.  Allergic/Immunologic: Negative for environmental allergies.  Neurological: Negative for dizziness, syncope and headaches.       Generalized weakness has improved Balance is poor   Hematological: Negative for adenopathy. Does not bruise/bleed easily.  Psychiatric/Behavioral: Negative for decreased concentration and dysphoric mood. The patient is not nervous/anxious.        Objective:   Physical Exam Constitutional:      General: She is not in acute distress.    Appearance: Normal appearance. She is well-developed and normal weight. She is not ill-appearing.  HENT:     Head: Normocephalic and atraumatic.  Eyes:     Conjunctiva/sclera: Conjunctivae normal.     Pupils: Pupils are equal, round, and reactive to light.  Neck:     Thyroid: No thyromegaly.      Vascular: No carotid bruit or JVD.  Cardiovascular:     Rate and Rhythm: Normal rate and regular rhythm.     Heart sounds: Normal heart sounds. No gallop.   Pulmonary:     Effort: Pulmonary effort is normal. No respiratory distress.     Breath sounds: Normal breath sounds. No wheezing or rales.  Abdominal:     General: Bowel sounds are normal. There is no distension or abdominal bruit.     Palpations: Abdomen is soft. There is no mass.     Tenderness: There is no abdominal tenderness. There is no right CVA tenderness or left CVA tenderness.     Comments: No suprapubic tenderness or fullness    Musculoskeletal:     Cervical back: Normal range of motion and neck supple.     Right lower leg: No edema.     Left lower leg: No edema.  Lymphadenopathy:     Cervical: No cervical adenopathy.  Skin:    General: Skin is warm and dry.     Findings: No rash.  Neurological:     Mental Status: She is alert.     Deep Tendon Reflexes: Reflexes are normal and symmetric.     Comments: Poor balance  req assistance for walking   Psychiatric:        Mood and Affect: Mood normal.     Comments: pleasant           Assessment & Plan:   Problem List Items Addressed This Visit      Cardiovascular and Mediastinum   Essential hypertension    bp in fair control at this time  BP Readings from Last 1 Encounters:  01/29/20 126/68   No changes needed Most recent labs reviewed  Disc lifstyle change  with low sodium diet and exercise  Plans to continue lisinopril 10 mg daily        Genitourinary   UTI (urinary tract infection) - Primary    Recent ER visit, caused a fall Reviewed hospital records, lab results and studies in detail  Re check ua today - improved and nl SG  Last cx is contaminated so repeated today Enc fluid intake-doing much better HH was ordered for poor balance and deconditioning-will start soon  Will finish cefdinir       Relevant Orders   POCT Urinalysis Dipstick  (Automated) (Completed)   Urine Culture     Other   PEMPHIGOID    Mouth issues continue to progress  Disc imp of food choice to prevent choking  Also good fluid intake      Hoarseness, persistent    Unsure if chronic cough is linked  Enc to continue ppi  Tessalon px to try      Hyponatremia    Mild in ER during uti 139  Re check today  Increased fluids-good for uti but hope this will not lower sodium further  No symptoms       Relevant Orders   Basic metabolic panel

## 2020-01-29 NOTE — Assessment & Plan Note (Addendum)
Recent ER visit, caused a fall Reviewed hospital records, lab results and studies in detail  Re check ua today - improved and nl SG  Last cx is contaminated so repeated today Enc fluid intake-doing much better HH was ordered for poor balance and deconditioning-will start soon  Will finish cefdinir

## 2020-01-29 NOTE — Assessment & Plan Note (Signed)
bp in fair control at this time  BP Readings from Last 1 Encounters:  01/29/20 126/68   No changes needed Most recent labs reviewed  Disc lifstyle change with low sodium diet and exercise  Plans to continue lisinopril 10 mg daily

## 2020-01-29 NOTE — Assessment & Plan Note (Signed)
Mouth issues continue to progress  Disc imp of food choice to prevent choking  Also good fluid intake

## 2020-01-30 ENCOUNTER — Telehealth: Payer: Self-pay | Admitting: Family Medicine

## 2020-01-30 DIAGNOSIS — G4700 Insomnia, unspecified: Secondary | ICD-10-CM | POA: Diagnosis not present

## 2020-01-30 DIAGNOSIS — Z87440 Personal history of urinary (tract) infections: Secondary | ICD-10-CM | POA: Diagnosis not present

## 2020-01-30 DIAGNOSIS — R296 Repeated falls: Secondary | ICD-10-CM | POA: Diagnosis not present

## 2020-01-30 DIAGNOSIS — Z85828 Personal history of other malignant neoplasm of skin: Secondary | ICD-10-CM | POA: Diagnosis not present

## 2020-01-30 DIAGNOSIS — Z87891 Personal history of nicotine dependence: Secondary | ICD-10-CM | POA: Diagnosis not present

## 2020-01-30 DIAGNOSIS — K219 Gastro-esophageal reflux disease without esophagitis: Secondary | ICD-10-CM | POA: Diagnosis not present

## 2020-01-30 DIAGNOSIS — I2510 Atherosclerotic heart disease of native coronary artery without angina pectoris: Secondary | ICD-10-CM | POA: Diagnosis not present

## 2020-01-30 DIAGNOSIS — M19019 Primary osteoarthritis, unspecified shoulder: Secondary | ICD-10-CM | POA: Diagnosis not present

## 2020-01-30 DIAGNOSIS — E860 Dehydration: Secondary | ICD-10-CM | POA: Diagnosis not present

## 2020-01-30 DIAGNOSIS — N3000 Acute cystitis without hematuria: Secondary | ICD-10-CM | POA: Diagnosis not present

## 2020-01-30 DIAGNOSIS — D414 Neoplasm of uncertain behavior of bladder: Secondary | ICD-10-CM | POA: Diagnosis not present

## 2020-01-30 DIAGNOSIS — N3281 Overactive bladder: Secondary | ICD-10-CM | POA: Diagnosis not present

## 2020-01-30 DIAGNOSIS — E785 Hyperlipidemia, unspecified: Secondary | ICD-10-CM | POA: Diagnosis not present

## 2020-01-30 DIAGNOSIS — N390 Urinary tract infection, site not specified: Secondary | ICD-10-CM | POA: Diagnosis not present

## 2020-01-30 DIAGNOSIS — I129 Hypertensive chronic kidney disease with stage 1 through stage 4 chronic kidney disease, or unspecified chronic kidney disease: Secondary | ICD-10-CM | POA: Diagnosis not present

## 2020-01-30 DIAGNOSIS — N189 Chronic kidney disease, unspecified: Secondary | ICD-10-CM | POA: Diagnosis not present

## 2020-01-30 DIAGNOSIS — Z90710 Acquired absence of both cervix and uterus: Secondary | ICD-10-CM | POA: Diagnosis not present

## 2020-01-30 DIAGNOSIS — R32 Unspecified urinary incontinence: Secondary | ICD-10-CM | POA: Diagnosis not present

## 2020-01-30 DIAGNOSIS — L121 Cicatricial pemphigoid: Secondary | ICD-10-CM | POA: Diagnosis not present

## 2020-01-30 DIAGNOSIS — J309 Allergic rhinitis, unspecified: Secondary | ICD-10-CM | POA: Diagnosis not present

## 2020-01-30 DIAGNOSIS — Z9181 History of falling: Secondary | ICD-10-CM | POA: Diagnosis not present

## 2020-01-30 DIAGNOSIS — R1310 Dysphagia, unspecified: Secondary | ICD-10-CM | POA: Diagnosis not present

## 2020-01-30 DIAGNOSIS — E871 Hypo-osmolality and hyponatremia: Secondary | ICD-10-CM | POA: Diagnosis not present

## 2020-01-30 DIAGNOSIS — K5730 Diverticulosis of large intestine without perforation or abscess without bleeding: Secondary | ICD-10-CM | POA: Diagnosis not present

## 2020-01-30 DIAGNOSIS — Z8619 Personal history of other infectious and parasitic diseases: Secondary | ICD-10-CM | POA: Diagnosis not present

## 2020-01-30 DIAGNOSIS — Z7951 Long term (current) use of inhaled steroids: Secondary | ICD-10-CM | POA: Diagnosis not present

## 2020-01-30 LAB — URINE CULTURE
MICRO NUMBER:: 11263505
Result:: NO GROWTH
SPECIMEN QUALITY:: ADEQUATE

## 2020-01-30 LAB — BASIC METABOLIC PANEL
BUN: 8 mg/dL (ref 6–23)
CO2: 25 mEq/L (ref 19–32)
Calcium: 8.5 mg/dL (ref 8.4–10.5)
Chloride: 94 mEq/L — ABNORMAL LOW (ref 96–112)
Creatinine, Ser: 0.79 mg/dL (ref 0.40–1.20)
GFR: 70.11 mL/min (ref >60.00)
Glucose, Bld: 93 mg/dL (ref 70–99)
Potassium: 4 mEq/L (ref 3.5–5.1)
Sodium: 127 mEq/L — ABNORMAL LOW (ref 135–145)

## 2020-01-30 NOTE — Telephone Encounter (Signed)
Glory Buff called [facility] needing verbal order home physical therapy  And there was a flag on a medication due to her allergy (pravastatin) for liptor.    1w1 2w3 1w1

## 2020-01-30 NOTE — Telephone Encounter (Signed)
Left VM letting Jatana know Dr. Marliss Coots comments and verbal order given

## 2020-01-30 NOTE — Telephone Encounter (Signed)
Ok those verbal orders  Not concerned with the allergy warning  Thanks

## 2020-02-03 ENCOUNTER — Encounter: Payer: Self-pay | Admitting: Family Medicine

## 2020-02-03 ENCOUNTER — Telehealth: Payer: Self-pay | Admitting: *Deleted

## 2020-02-03 DIAGNOSIS — R296 Repeated falls: Secondary | ICD-10-CM | POA: Diagnosis not present

## 2020-02-03 DIAGNOSIS — E871 Hypo-osmolality and hyponatremia: Secondary | ICD-10-CM | POA: Diagnosis not present

## 2020-02-03 DIAGNOSIS — I129 Hypertensive chronic kidney disease with stage 1 through stage 4 chronic kidney disease, or unspecified chronic kidney disease: Secondary | ICD-10-CM | POA: Diagnosis not present

## 2020-02-03 DIAGNOSIS — E860 Dehydration: Secondary | ICD-10-CM | POA: Diagnosis not present

## 2020-02-03 DIAGNOSIS — I2510 Atherosclerotic heart disease of native coronary artery without angina pectoris: Secondary | ICD-10-CM | POA: Diagnosis not present

## 2020-02-03 DIAGNOSIS — N3000 Acute cystitis without hematuria: Secondary | ICD-10-CM | POA: Diagnosis not present

## 2020-02-03 NOTE — Telephone Encounter (Signed)
Verbal orders given to [employer]

## 2020-02-03 NOTE — Telephone Encounter (Signed)
Please ok those verbal orders  

## 2020-02-03 NOTE — Telephone Encounter (Signed)
Victoria Allison with [facility] left a voicemail stating that she is requesting verbal orders for home health PT for once a week for one week, two times a week for 3 weeks and once a week for one week. When calling back [phone] opt 2

## 2020-02-04 ENCOUNTER — Ambulatory Visit (INDEPENDENT_AMBULATORY_CARE_PROVIDER_SITE_OTHER): Payer: Medicare Other

## 2020-02-04 ENCOUNTER — Other Ambulatory Visit: Payer: Self-pay

## 2020-02-04 DIAGNOSIS — H4063X0 Glaucoma secondary to drugs, bilateral, stage unspecified: Secondary | ICD-10-CM | POA: Diagnosis not present

## 2020-02-04 DIAGNOSIS — N3281 Overactive bladder: Secondary | ICD-10-CM | POA: Diagnosis not present

## 2020-02-04 NOTE — Progress Notes (Signed)
PTNS  Session # 4  Health & Social Factors: No Change Caffeine: 0 Alcohol: 0 Daytime voids #per day: 10  Night-time voids #per night: 3 Urgency: Severe Incontinence Episodes #per day: 3-4 Ankle used: Left Treatment Setting: 3 Feeling/ Response: Sensory & Toe Flex Comments: N/A  Performed By: Gordy Clement, CMA   Follow Up: RTC in 1 week for PTNS

## 2020-02-04 NOTE — Telephone Encounter (Signed)
Noted  

## 2020-02-06 DIAGNOSIS — E860 Dehydration: Secondary | ICD-10-CM | POA: Diagnosis not present

## 2020-02-06 DIAGNOSIS — N3000 Acute cystitis without hematuria: Secondary | ICD-10-CM | POA: Diagnosis not present

## 2020-02-06 DIAGNOSIS — I2510 Atherosclerotic heart disease of native coronary artery without angina pectoris: Secondary | ICD-10-CM | POA: Diagnosis not present

## 2020-02-06 DIAGNOSIS — E871 Hypo-osmolality and hyponatremia: Secondary | ICD-10-CM | POA: Diagnosis not present

## 2020-02-06 DIAGNOSIS — I129 Hypertensive chronic kidney disease with stage 1 through stage 4 chronic kidney disease, or unspecified chronic kidney disease: Secondary | ICD-10-CM | POA: Diagnosis not present

## 2020-02-06 DIAGNOSIS — R296 Repeated falls: Secondary | ICD-10-CM | POA: Diagnosis not present

## 2020-02-10 ENCOUNTER — Other Ambulatory Visit: Payer: Self-pay

## 2020-02-10 ENCOUNTER — Encounter: Payer: Self-pay | Admitting: Urology

## 2020-02-10 ENCOUNTER — Ambulatory Visit (INDEPENDENT_AMBULATORY_CARE_PROVIDER_SITE_OTHER): Payer: Medicare Other | Admitting: Urology

## 2020-02-10 VITALS — BP 152/82 | HR 77

## 2020-02-10 DIAGNOSIS — N3000 Acute cystitis without hematuria: Secondary | ICD-10-CM | POA: Diagnosis not present

## 2020-02-10 DIAGNOSIS — E860 Dehydration: Secondary | ICD-10-CM | POA: Diagnosis not present

## 2020-02-10 DIAGNOSIS — E871 Hypo-osmolality and hyponatremia: Secondary | ICD-10-CM | POA: Diagnosis not present

## 2020-02-10 DIAGNOSIS — N3946 Mixed incontinence: Secondary | ICD-10-CM

## 2020-02-10 DIAGNOSIS — I2510 Atherosclerotic heart disease of native coronary artery without angina pectoris: Secondary | ICD-10-CM

## 2020-02-10 DIAGNOSIS — I129 Hypertensive chronic kidney disease with stage 1 through stage 4 chronic kidney disease, or unspecified chronic kidney disease: Secondary | ICD-10-CM | POA: Diagnosis not present

## 2020-02-10 DIAGNOSIS — R296 Repeated falls: Secondary | ICD-10-CM | POA: Diagnosis not present

## 2020-02-10 LAB — URINALYSIS, COMPLETE
Bilirubin, UA: NEGATIVE
Glucose, UA: NEGATIVE
Ketones, UA: NEGATIVE
Nitrite, UA: NEGATIVE
Protein,UA: NEGATIVE
Specific Gravity, UA: <1.005 — ABNORMAL LOW (ref 1.005–1.030)
Urobilinogen, Ur: 0.2 mg/dL (ref 0.2–1.0)
pH, UA: 6 (ref 5.0–7.5)

## 2020-02-10 LAB — MICROSCOPIC EXAMINATION: WBC, UA: >30 /hpf — AB (ref 0–5)

## 2020-02-10 MED ORDER — NITROFURANTOIN MACROCRYSTAL 100 MG PO CAPS: 100.0000 mg | ORAL_CAPSULE | Freq: Two times a day (BID) | ORAL | 0 refills | Status: DC

## 2020-02-10 NOTE — Addendum Note (Signed)
Addended by: Alvera Novel on: 02/10/2020 09:58 AM   Modules accepted: Orders

## 2020-02-10 NOTE — Progress Notes (Signed)
02/10/2020 8:54 AM   Victoria Allison Revonda Humphrey August 30, 1940 [id number]  Referring provider: Abner Greenspan, MD [address] [place],  [place] [address]  Chief Complaint  Patient presents with  . Follow-up    HPI: The patient in the last month has vaginal discomfort when she voids but is relieved by voiding but not on every occasion. Some of the d etails of the history were difficult. Last week she had low back pain radiating to her left leg and the settled down.  It was more challenging to baseline her symptoms. She was having urge incontinence wearing 2 pads during the day and 1 small pad at night. She said the leakage was mild.  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 Some atrophic changes  Last urine culture positive.The patient is having less discomfort but again she is nonspecific. No more back pain since the Macrodantin.I think she still has some urge incontinence especially in the middle of the night  Cystoscopy: On cystoscopy initially I thought the bladder mucosa was completely normal. It was a little bit erythematous along the right lateral wall and there appeared to be a few millimeters of elevation or lesions that may represent mild papillary superficial carcinoma. She smoked in the 1970s. She also had significant pseudomembranous trigonitis next to it. I tried to use the flow to see if it would move the initial tensional papillary lesions but they did not  Patient had bladder biopsy with high-grade papillary urothelial carcinoma but patient refused BCG. It was a very small tumor approximately 5 mm in size. She was to be followed every 4 to 5 months instead of 3 months because of comorbidities. She was prescribed Myrbetriq and oxybutynin discontinued. She was recommended Premarin cream for mild burning.  Myrbetriq  helps 20%. Premarin cream might make burning worse and she stopped it. I do not think she takes daily trimethoprim. History is little bit more challenging.   Reassess in 6 or 7 weeks on Vesicare 5 mg plus Myrbetriq. Send urine for culture. Reconsider starting prophylaxis if she starts getting positive cultures. Percutaneous tibial nerve stimulation option next visit  Patient had cystoscopy by my partner May 20 and will have another 1 in 6 months.  It was negative.  Urine was cloudy.  Culture was positive and she was treated with an antibiotic.  She is having trouble affording the Myrbetriq.  Culture the months previous was negative  Clinically not infected.  She may be chronically colonized.  I did not send urine for culture  She is a partial responder to oxybutynin Myrbetriq but it $700 every 6 months.  I gave her 3 months of samples and see her every 6 months and will try to do the same to reduce her cost.   TOday Frequency stable.  In the medical record she had one positive culture and 2 - cultures in the last 3 months Frequency and discomfort worsened 10 days.  In the last 10 days worse urge incontinence.  At baseline still has urge incontinence and frequency.  It was confusing but I think she is off all medications even the Vesicare and certainly the Myrbetriq since he said it was not working that well and was expensive she is now doing percutaneous tibial nerve stimulation a week for   PMH: Past Medical History:  Diagnosis Date  . Allergic  rhinitis   . CAD (coronary artery disease)   . Cancer ([place])    skin  . Chronic kidney disease    overactive bladder  . Colon polyp   . Diverticulosis    sigmoid and descending colon  . Elevated LFTs over one year ago  . Esophageal ring   . GERD (gastroesophageal reflux disease)   . History of hepatitis 1975   unsure of type  . Hyperlipemia   . Hypertension   . Internal hemorrhoids   . Lichen sclerosus 16/96   (DX BY GYN AFTER ABN  PAP)  . Pemphigoid, cicatricial    D/O AFFECTING EYES, MOUTH, THROAT NOW (DX [facility])  . Shingles 03/13/2013  . Urinary incontinence    OVERACTIVE BLADDER    Surgical History: Past Surgical History:  Procedure Laterality Date  . ABDOMINAL HYSTERECTOMY    . ANGIOPLASTY  yrs ago   LAD  . CARDIAC ELECTROPHYSIOLOGY MAPPING AND ABLATION    . CATARACT EXTRACTION Bilateral   . COLONOSCOPY    . CYSTOSCOPY WITH BIOPSY N/A 03/22/2019   Procedure: CYSTOSCOPY WITH BLADDER BIOPSY WITH GEMCITABINE;  Surgeon: Billey Co, MD;  Location: [facility];  Service: Urology;  Laterality: N/A;  . CYSTOSCOPY WITH FULGERATION N/A 03/22/2019   Procedure: CYSTOSCOPY WITH FULGERATION;  Surgeon: Billey Co, MD;  Location: [facility];  Service: Urology;  Laterality: N/A;  . ESOPHAGOGASTRODUODENOSCOPY  09/14/2011   Procedure: ESOPHAGOGASTRODUODENOSCOPY (EGD);  Surgeon: Gatha Mayer, MD;  Location: [facility];  Service: Endoscopy;  Laterality: N/A;  needs xray  . ESOPHAGOGASTRODUODENOSCOPY N/A 01/02/2013   Procedure: ESOPHAGOGASTRODUODENOSCOPY (EGD);  Surgeon: Gatha Mayer, MD;  Location: [facility];  Service: Endoscopy;  Laterality: N/A;  . ESOPHAGOGASTRODUODENOSCOPY N/A 04/12/2013   Procedure: ESOPHAGOGASTRODUODENOSCOPY (EGD);  Surgeon: Gatha Mayer, MD;  Location: [facility];  Service: Endoscopy;  Laterality: N/A;  . ESOPHAGOGASTRODUODENOSCOPY (EGD) WITH PROPOFOL N/A 05/24/2016   Procedure: ESOPHAGOGASTRODUODENOSCOPY (EGD) WITH PROPOFOL;  Surgeon: Gatha Mayer, MD;  Location: [facility];  Service: Endoscopy;  Laterality: N/A;  . ESOPHAGOGASTRODUODENOSCOPY (EGD) WITH PROPOFOL N/A 08/22/2017   Procedure: ESOPHAGOGASTRODUODENOSCOPY (EGD) WITH PROPOFOL;  Surgeon: Gatha Mayer, MD;  Location: [facility];  Service: Endoscopy;  Laterality: N/A;  . ESOPHAGOGASTRODUODENOSCOPY (EGD) WITH PROPOFOL N/A 02/26/2019   Procedure: ESOPHAGOGASTRODUODENOSCOPY (EGD) WITH PROPOFOL;  Surgeon: Gatha Mayer, MD;   Location: [facility];  Service: Endoscopy;  Laterality: N/A;  . Venia Minks DILATION N/A 01/02/2013   Procedure: Venia Minks DILATION;  Surgeon: Gatha Mayer, MD;  Location: [facility];  Service: Endoscopy;  Laterality: N/A;  . Venia Minks DILATION N/A 05/24/2016   Procedure: Venia Minks DILATION;  Surgeon: Gatha Mayer, MD;  Location: [facility];  Service: Endoscopy;  Laterality: N/A;  . Venia Minks DILATION N/A 08/22/2017   Procedure: Venia Minks DILATION;  Surgeon: Gatha Mayer, MD;  Location: [facility];  Service: Endoscopy;  Laterality: N/A;  Venia Minks DILATION  02/26/2019   Procedure: Venia Minks DILATION;  Surgeon: Gatha Mayer, MD;  Location: [facility];  Service: Endoscopy;;  . NOSE SURGERY     Biopsy  . OOPHORECTOMY    . SAVORY DILATION  09/14/2011   Procedure: SAVORY DILATION;  Surgeon: Gatha Mayer, MD;  Location: [facility];  Service: Endoscopy;  Laterality: N/A;  . SAVORY DILATION N/A 01/02/2013   Procedure: SAVORY DILATION;  Surgeon: Gatha Mayer, MD;  Location: [facility];  Service: Endoscopy;  Laterality: N/A;  . SAVORY DILATION N/A 04/12/2013   Procedure: Azzie Almas DILATION;  Surgeon: Gatha Mayer,  MD;  Location: [facility];  Service: Endoscopy;  Laterality: N/A;  . skin cancer removal  02/2016    Home Medications:  Allergies as of 02/10/2020      Reactions   Adhesive [tape] Rash   Paper tape and tegaderm OK   Aspirin Other (See Comments)   Bleeds too easily   Latex Itching, Rash   Lipitor [atorvastatin] Other (See Comments)   Elevated LFT's   Tetanus Toxoid Rash   Ultram [tramadol] Other (See Comments)   Dizziness      Medication List       Accurate as of February 10, 2020  8:54 AM. If you have any questions, ask your nurse or doctor.        STOP taking these medications   benzonatate 200 MG capsule Commonly known as: TESSALON Stopped by: Reece Packer, MD   Omnipred 1 % ophthalmic suspension Generic drug: prednisoLONE acetate Stopped by: Reece Packer, MD   solifenacin 5 MG tablet Commonly known as: VESICARE Stopped by: Reece Packer, MD     TAKE these medications   acetaminophen 500 MG tablet Commonly known as: TYLENOL Take 500 mg by mouth daily as needed for moderate pain or headache.   bimatoprost 0.03 % ophthalmic solution Commonly known as: LUMIGAN 1 drop at bedtime.   dexamethasone 0.5 MG/5ML elixir Take by mouth.   fluticasone 50 MCG/ACT nasal spray Commonly known as: FLONASE Place 2 sprays into both nostrils daily as needed for allergies. What changed: when to take this   lisinopril 10 MG tablet Commonly known as: ZESTRIL Take 1 tablet (10 mg total) by mouth daily.   omeprazole 40 MG capsule Commonly known as: PRILOSEC Take 1 capsule (40 mg total) by mouth daily.   pravastatin 40 MG tablet Commonly known as: PRAVACHOL Take 1 tablet (40 mg total) by mouth daily.   predniSONE 5 MG tablet Commonly known as: DELTASONE Take by mouth.   sodium chloride 0.65 % Soln nasal spray Commonly known as: OCEAN Place 1 spray into both nostrils daily as needed for congestion.   timolol 0.5 % ophthalmic solution Commonly known as: TIMOPTIC 1 drop 2 (two) times daily.       Allergies:  Allergies  Allergen Reactions  . Adhesive [Tape] Rash    Paper tape and tegaderm OK  . Aspirin Other (See Comments)    Bleeds too easily  . Latex Itching and Rash  . Lipitor [Atorvastatin] Other (See Comments)    Elevated LFT's  . Tetanus Toxoid Rash  . Ultram [Tramadol] Other (See Comments)    Dizziness    Family History: Family History  Problem Relation Age of Onset  . Lung cancer Father   . Esophageal cancer Son 41  . Stomach cancer Son 38  . Stroke Mother        CVA  . Alzheimer's disease Mother   . Hypertension Brother   . Breast cancer Maternal Aunt   . Colon cancer Neg Hx   . Rectal cancer Neg Hx   . Colon polyps Neg Hx     Social History:  reports that she quit smoking about 51 years ago.  She has never used smokeless tobacco. She reports that she does not drink alcohol and does not use drugs.  ROS:  Physical Exam: BP (!) 152/82   Pulse 77   LMP 02/29/1968   Constitutional:  Alert and oriented, No acute distress.  Laboratory Data: Lab Results  Component Value Date   WBC 11.0 (H) 01/27/2020   HGB 11.8 (L) 01/27/2020   HCT 35.6 (L) 01/27/2020   MCV 85.6 01/27/2020   PLT 210 01/27/2020    Lab Results  Component Value Date   CREATININE 0.79 01/29/2020    No results found for: PSA  No results found for: TESTOSTERONE  Lab Results  Component Value Date   HGBA1C 6.0 01/21/2020    Urinalysis    Component Value Date/Time   COLORURINE AMBER (A) 01/27/2020 1135   APPEARANCEUR HAZY (A) 01/27/2020 1135   APPEARANCEUR Cloudy (A) 01/20/2020 1424   LABSPEC 1.021 01/27/2020 1135   PHURINE 5.0 01/27/2020 1135   GLUCOSEU NEGATIVE 01/27/2020 1135   HGBUR SMALL (A) 01/27/2020 1135   HGBUR trace-intact 09/21/2007 1119   BILIRUBINUR Negative 01/29/2020 1550   BILIRUBINUR Negative 01/20/2020 [place] 01/27/2020 1135   PROTEINUR Negative 01/29/2020 1550   PROTEINUR 100 (A) 01/27/2020 1135   UROBILINOGEN 0.2 01/29/2020 1550   UROBILINOGEN 0.2 08/26/2009 0833   NITRITE Negative 01/29/2020 1550   NITRITE NEGATIVE 01/27/2020 1135   LEUKOCYTESUR Trace (A) 01/29/2020 1550   LEUKOCYTESUR TRACE (A) 01/27/2020 1135    Pertinent Imaging:   Assessment & Plan: Send urine for culture.  Macrodantin 100 mg twice a day for 1 week.  See in 1 year.  Continue with percutaneous tibial nerve stimulation.  There are no diagnoses linked to this encounter.  No follow-ups on file.  Reece Packer, MD  [facility] [address] [place], [place] [address] [phone]

## 2020-02-11 ENCOUNTER — Ambulatory Visit (INDEPENDENT_AMBULATORY_CARE_PROVIDER_SITE_OTHER): Payer: Medicare Other | Admitting: *Deleted

## 2020-02-11 DIAGNOSIS — N3281 Overactive bladder: Secondary | ICD-10-CM

## 2020-02-11 DIAGNOSIS — N3946 Mixed incontinence: Secondary | ICD-10-CM | POA: Diagnosis not present

## 2020-02-11 NOTE — Progress Notes (Signed)
PTNS  Session # 5  Health & Social Factors: No Change Caffeine: 0 Alcohol: 0 Daytime voids #per day: 10  Night-time voids #per night: 3 Urgency: Severe Incontinence Episodes #per day: 3-4 Ankle used: Right Treatment Setting: 5 Feeling/ Response: Sensory & Toe Flex Comments: N/A  Performed By: Gaspar Cola  CMA   Follow Up: RTC in 1 week for PTNS

## 2020-02-12 ENCOUNTER — Encounter: Payer: Self-pay | Admitting: Family Medicine

## 2020-02-12 DIAGNOSIS — I129 Hypertensive chronic kidney disease with stage 1 through stage 4 chronic kidney disease, or unspecified chronic kidney disease: Secondary | ICD-10-CM | POA: Diagnosis not present

## 2020-02-12 DIAGNOSIS — I2510 Atherosclerotic heart disease of native coronary artery without angina pectoris: Secondary | ICD-10-CM | POA: Diagnosis not present

## 2020-02-12 DIAGNOSIS — E860 Dehydration: Secondary | ICD-10-CM | POA: Diagnosis not present

## 2020-02-12 DIAGNOSIS — R296 Repeated falls: Secondary | ICD-10-CM | POA: Diagnosis not present

## 2020-02-12 DIAGNOSIS — N3000 Acute cystitis without hematuria: Secondary | ICD-10-CM | POA: Diagnosis not present

## 2020-02-12 DIAGNOSIS — E871 Hypo-osmolality and hyponatremia: Secondary | ICD-10-CM | POA: Diagnosis not present

## 2020-02-14 LAB — CULTURE, URINE COMPREHENSIVE

## 2020-02-17 DIAGNOSIS — E860 Dehydration: Secondary | ICD-10-CM | POA: Diagnosis not present

## 2020-02-17 DIAGNOSIS — I129 Hypertensive chronic kidney disease with stage 1 through stage 4 chronic kidney disease, or unspecified chronic kidney disease: Secondary | ICD-10-CM | POA: Diagnosis not present

## 2020-02-17 DIAGNOSIS — N3000 Acute cystitis without hematuria: Secondary | ICD-10-CM | POA: Diagnosis not present

## 2020-02-17 DIAGNOSIS — I2510 Atherosclerotic heart disease of native coronary artery without angina pectoris: Secondary | ICD-10-CM | POA: Diagnosis not present

## 2020-02-17 DIAGNOSIS — E871 Hypo-osmolality and hyponatremia: Secondary | ICD-10-CM | POA: Diagnosis not present

## 2020-02-17 DIAGNOSIS — R296 Repeated falls: Secondary | ICD-10-CM | POA: Diagnosis not present

## 2020-02-18 ENCOUNTER — Other Ambulatory Visit: Payer: Self-pay

## 2020-02-18 ENCOUNTER — Ambulatory Visit: Payer: Medicare Other

## 2020-02-18 DIAGNOSIS — N3946 Mixed incontinence: Secondary | ICD-10-CM

## 2020-02-18 DIAGNOSIS — N3281 Overactive bladder: Secondary | ICD-10-CM

## 2020-02-18 NOTE — Progress Notes (Signed)
PTNS  Session # 6  Health & Social Factors: Pt with significant cough today  Caffeine: 0 Alcohol: 0 Daytime voids #per day: 10 Night-time voids #per night: 3 Urgency: Severe  Incontinence Episodes #per day: 3-4 Ankle used: Left Treatment Setting: 7 Feeling/ Response: Sensory & Toe Flex  Comments: N/A  Performed By: Gordy Clement, CMA   Follow Up: RTC in 1 week for PTNS

## 2020-02-19 ENCOUNTER — Other Ambulatory Visit (INDEPENDENT_AMBULATORY_CARE_PROVIDER_SITE_OTHER): Payer: Medicare Other | Admitting: Family Medicine

## 2020-02-19 ENCOUNTER — Telehealth (INDEPENDENT_AMBULATORY_CARE_PROVIDER_SITE_OTHER): Payer: Medicare Other | Admitting: Family Medicine

## 2020-02-19 ENCOUNTER — Other Ambulatory Visit: Payer: Medicare Other

## 2020-02-19 ENCOUNTER — Encounter: Payer: Self-pay | Admitting: Family Medicine

## 2020-02-19 VITALS — Ht 60.0 in

## 2020-02-19 DIAGNOSIS — R0981 Nasal congestion: Secondary | ICD-10-CM

## 2020-02-19 DIAGNOSIS — R059 Cough, unspecified: Secondary | ICD-10-CM

## 2020-02-19 DIAGNOSIS — E860 Dehydration: Secondary | ICD-10-CM | POA: Diagnosis not present

## 2020-02-19 DIAGNOSIS — M2550 Pain in unspecified joint: Secondary | ICD-10-CM

## 2020-02-19 DIAGNOSIS — I2510 Atherosclerotic heart disease of native coronary artery without angina pectoris: Secondary | ICD-10-CM

## 2020-02-19 DIAGNOSIS — E871 Hypo-osmolality and hyponatremia: Secondary | ICD-10-CM | POA: Diagnosis not present

## 2020-02-19 DIAGNOSIS — I129 Hypertensive chronic kidney disease with stage 1 through stage 4 chronic kidney disease, or unspecified chronic kidney disease: Secondary | ICD-10-CM | POA: Diagnosis not present

## 2020-02-19 DIAGNOSIS — R296 Repeated falls: Secondary | ICD-10-CM | POA: Diagnosis not present

## 2020-02-19 DIAGNOSIS — N3000 Acute cystitis without hematuria: Secondary | ICD-10-CM | POA: Diagnosis not present

## 2020-02-19 LAB — POC INFLUENZA A&B (BINAX/QUICKVUE)
Influenza A, POC: NEGATIVE
Influenza B, POC: NEGATIVE

## 2020-02-19 MED ORDER — DOXYCYCLINE HYCLATE 100 MG PO TABS: 100.0000 mg | ORAL_TABLET | Freq: Two times a day (BID) | ORAL | 0 refills | Status: AC

## 2020-02-19 NOTE — Progress Notes (Signed)
Flu and covid testing

## 2020-02-19 NOTE — Progress Notes (Signed)
     Antonios Ostrow T. Daijon Wenke, MD Primary Care and [place] at [facility] [place], [address] Phone: [phone]  FAX: [place] - 79 y.o. female  MRN [id number]  Date of Birth: 1940/03/22  Visit Date: 02/19/2020  PCP: Abner Greenspan, MD  Referred by: Tower, Wynelle Fanny, MD  Virtual Visit via Telephone Note:  I connected with  Victoria Allison on 02/19/2020  2:40 PM EST by telephone and verified that I am speaking with the correct person using two identifiers.   Location patient: home phone or cell phone Location provider: work or home office Consent: Verbal consent directly obtained from Victoria Allison and that there may be a patient responsible charge related to this service. Persons participating in the virtual visit: patient, provider  I discussed the limitations of evaluation and management by telemedicine and the availability of in person appointments.  The patient expressed understanding and agreed to proceed.     History of Present Illness:  Feels awful all the time.  Hurts for about 1 month.  Daughter helped her get up, and she fell again.  Put her in the hospital for a day.  At the end of November, she went to the emergency room is dehydrated and had some cystitis.  She was given antibiotics at that time.  Today she generally feels poorly, she has some aching as well as some nasal congestion and cough.  Globally she does not feel all that well.  She denies GI symptoms and neurological complaints.  She had a nurse come this morning.  Review of Systems: pertinent positives and pertinent negatives as per HPI No acute distress verbally   Observations/Objective/Exam:  An attempt was made to discern vital signs over the phone and per patient if applicable and possible.   Neurological:     Mental Status: pleasant and appropriate   Psychiatric:        Thought Content: Thought content normal.       Assessment and Plan:    ICD-10-CM   1. Cough  R05.9   2. Nasal congestion  R09.81   3. Arthralgia, unspecified joint  M25.50    Total encounter time: 20 minutes. This includes total time spent on the day of encounter.  This includes chart review on an 79 year old patient including ER visits from prior studies.  Concern for multiple etiologies.  Check for COVID-19, influenza.  I am also getting different antibiotics in the case that she could have pneumonia.  If she does poorly then additional follow-up would be indicated.  She does understand she needs to quarantine.  I discussed the assessment and treatment plan with the patient. The patient was provided an opportunity to ask questions and all were answered. The patient agreed with the plan and demonstrated an understanding of the instructions.   The patient was advised to call back or seek an in-person evaluation if the symptoms worsen or if the condition fails to improve as anticipated.  Follow-up: prn unless noted otherwise below No follow-ups on file.  Meds ordered this encounter  Medications  . doxycycline (VIBRA-TABS) 100 MG tablet    Sig: Take 1 tablet (100 mg total) by mouth 2 (two) times daily for 10 days.    Dispense:  20 tablet    Refill:  0   No orders of the defined types were placed in this encounter.   Signed,  Maud Deed. Shernell Saldierna, MD

## 2020-02-19 NOTE — Progress Notes (Signed)
Lab appointment scheduled today at 3:45 pm.

## 2020-02-21 LAB — SARS-COV-2, NAA 2 DAY TAT

## 2020-02-21 LAB — NOVEL CORONAVIRUS, NAA: SARS-CoV-2, NAA: NOT DETECTED

## 2020-02-25 ENCOUNTER — Other Ambulatory Visit: Payer: Self-pay

## 2020-02-25 ENCOUNTER — Ambulatory Visit (INDEPENDENT_AMBULATORY_CARE_PROVIDER_SITE_OTHER): Payer: Medicare Other | Admitting: *Deleted

## 2020-02-25 DIAGNOSIS — N3281 Overactive bladder: Secondary | ICD-10-CM | POA: Diagnosis not present

## 2020-02-25 NOTE — Progress Notes (Signed)
PTNS  Session # 7  Health & Social Factors: Pt with significant cough today  Caffeine: 0 Alcohol: 0 Daytime voids #per day: 10 Night-time voids #per night: 3 Urgency: Severe  Incontinence Episodes #per day: 3-4 Ankle used: Left Treatment Setting: 3 Feeling/ Response: Sensory & Toe Flex  Comments: N/A  Performed By: Ples Specter  CMA   Follow Up: RTC in 1 week for PTNS

## 2020-02-26 DIAGNOSIS — L121 Cicatricial pemphigoid: Secondary | ICD-10-CM | POA: Diagnosis not present

## 2020-02-27 DIAGNOSIS — R296 Repeated falls: Secondary | ICD-10-CM | POA: Diagnosis not present

## 2020-02-27 DIAGNOSIS — N3000 Acute cystitis without hematuria: Secondary | ICD-10-CM | POA: Diagnosis not present

## 2020-02-27 DIAGNOSIS — E871 Hypo-osmolality and hyponatremia: Secondary | ICD-10-CM | POA: Diagnosis not present

## 2020-02-27 DIAGNOSIS — E860 Dehydration: Secondary | ICD-10-CM | POA: Diagnosis not present

## 2020-02-27 DIAGNOSIS — I129 Hypertensive chronic kidney disease with stage 1 through stage 4 chronic kidney disease, or unspecified chronic kidney disease: Secondary | ICD-10-CM | POA: Diagnosis not present

## 2020-02-27 DIAGNOSIS — I2510 Atherosclerotic heart disease of native coronary artery without angina pectoris: Secondary | ICD-10-CM | POA: Diagnosis not present

## 2020-02-29 DIAGNOSIS — I2510 Atherosclerotic heart disease of native coronary artery without angina pectoris: Secondary | ICD-10-CM | POA: Diagnosis not present

## 2020-02-29 DIAGNOSIS — R1310 Dysphagia, unspecified: Secondary | ICD-10-CM | POA: Diagnosis not present

## 2020-02-29 DIAGNOSIS — Z7951 Long term (current) use of inhaled steroids: Secondary | ICD-10-CM | POA: Diagnosis not present

## 2020-02-29 DIAGNOSIS — M19019 Primary osteoarthritis, unspecified shoulder: Secondary | ICD-10-CM | POA: Diagnosis not present

## 2020-02-29 DIAGNOSIS — Z8619 Personal history of other infectious and parasitic diseases: Secondary | ICD-10-CM | POA: Diagnosis not present

## 2020-02-29 DIAGNOSIS — N3281 Overactive bladder: Secondary | ICD-10-CM | POA: Diagnosis not present

## 2020-02-29 DIAGNOSIS — K5730 Diverticulosis of large intestine without perforation or abscess without bleeding: Secondary | ICD-10-CM | POA: Diagnosis not present

## 2020-02-29 DIAGNOSIS — Z87891 Personal history of nicotine dependence: Secondary | ICD-10-CM | POA: Diagnosis not present

## 2020-02-29 DIAGNOSIS — I129 Hypertensive chronic kidney disease with stage 1 through stage 4 chronic kidney disease, or unspecified chronic kidney disease: Secondary | ICD-10-CM | POA: Diagnosis not present

## 2020-02-29 DIAGNOSIS — D414 Neoplasm of uncertain behavior of bladder: Secondary | ICD-10-CM | POA: Diagnosis not present

## 2020-02-29 DIAGNOSIS — Z87440 Personal history of urinary (tract) infections: Secondary | ICD-10-CM | POA: Diagnosis not present

## 2020-02-29 DIAGNOSIS — E785 Hyperlipidemia, unspecified: Secondary | ICD-10-CM | POA: Diagnosis not present

## 2020-02-29 DIAGNOSIS — R296 Repeated falls: Secondary | ICD-10-CM | POA: Diagnosis not present

## 2020-02-29 DIAGNOSIS — N189 Chronic kidney disease, unspecified: Secondary | ICD-10-CM | POA: Diagnosis not present

## 2020-02-29 DIAGNOSIS — Z85828 Personal history of other malignant neoplasm of skin: Secondary | ICD-10-CM | POA: Diagnosis not present

## 2020-02-29 DIAGNOSIS — E871 Hypo-osmolality and hyponatremia: Secondary | ICD-10-CM | POA: Diagnosis not present

## 2020-02-29 DIAGNOSIS — E860 Dehydration: Secondary | ICD-10-CM | POA: Diagnosis not present

## 2020-02-29 DIAGNOSIS — Z9181 History of falling: Secondary | ICD-10-CM | POA: Diagnosis not present

## 2020-02-29 DIAGNOSIS — N3000 Acute cystitis without hematuria: Secondary | ICD-10-CM | POA: Diagnosis not present

## 2020-02-29 DIAGNOSIS — G4700 Insomnia, unspecified: Secondary | ICD-10-CM | POA: Diagnosis not present

## 2020-02-29 DIAGNOSIS — K219 Gastro-esophageal reflux disease without esophagitis: Secondary | ICD-10-CM | POA: Diagnosis not present

## 2020-02-29 DIAGNOSIS — J309 Allergic rhinitis, unspecified: Secondary | ICD-10-CM | POA: Diagnosis not present

## 2020-02-29 DIAGNOSIS — Z90710 Acquired absence of both cervix and uterus: Secondary | ICD-10-CM | POA: Diagnosis not present

## 2020-02-29 DIAGNOSIS — R32 Unspecified urinary incontinence: Secondary | ICD-10-CM | POA: Diagnosis not present

## 2020-02-29 DIAGNOSIS — L121 Cicatricial pemphigoid: Secondary | ICD-10-CM | POA: Diagnosis not present

## 2020-03-02 ENCOUNTER — Other Ambulatory Visit: Payer: Self-pay | Admitting: Family Medicine

## 2020-03-03 ENCOUNTER — Other Ambulatory Visit: Payer: Self-pay

## 2020-03-03 ENCOUNTER — Ambulatory Visit (INDEPENDENT_AMBULATORY_CARE_PROVIDER_SITE_OTHER): Payer: Medicare Other | Admitting: *Deleted

## 2020-03-03 ENCOUNTER — Ambulatory Visit: Payer: Self-pay

## 2020-03-03 DIAGNOSIS — N3281 Overactive bladder: Secondary | ICD-10-CM

## 2020-03-03 NOTE — Progress Notes (Signed)
PTNS  Session #8  Health & Social Factors:Pt with significant cough today Caffeine:0 Alcohol:0 Daytime voids #per day:10 Night-time voids #per night:3 Urgency:Severe Incontinence Episodes #per day:3-4 Ankle used:right Treatment Setting:3 Feeling/ Response:Sensory & Toe Flex Comments:N/A  Performed [id number] [place] Noori  CMA  Follow Up:RTC in 1 week for PTNS

## 2020-03-06 DIAGNOSIS — I129 Hypertensive chronic kidney disease with stage 1 through stage 4 chronic kidney disease, or unspecified chronic kidney disease: Secondary | ICD-10-CM | POA: Diagnosis not present

## 2020-03-06 DIAGNOSIS — R296 Repeated falls: Secondary | ICD-10-CM | POA: Diagnosis not present

## 2020-03-06 DIAGNOSIS — E871 Hypo-osmolality and hyponatremia: Secondary | ICD-10-CM | POA: Diagnosis not present

## 2020-03-06 DIAGNOSIS — I2510 Atherosclerotic heart disease of native coronary artery without angina pectoris: Secondary | ICD-10-CM | POA: Diagnosis not present

## 2020-03-06 DIAGNOSIS — N3000 Acute cystitis without hematuria: Secondary | ICD-10-CM | POA: Diagnosis not present

## 2020-03-06 DIAGNOSIS — E860 Dehydration: Secondary | ICD-10-CM | POA: Diagnosis not present

## 2020-03-10 ENCOUNTER — Ambulatory Visit (INDEPENDENT_AMBULATORY_CARE_PROVIDER_SITE_OTHER): Payer: Medicare Other

## 2020-03-10 ENCOUNTER — Other Ambulatory Visit: Payer: Self-pay

## 2020-03-10 DIAGNOSIS — N3281 Overactive bladder: Secondary | ICD-10-CM | POA: Diagnosis not present

## 2020-03-10 NOTE — Progress Notes (Signed)
PTNS  Session # 9  Health & Social Factors: no change Caffeine: 0 Alcohol: 0 Daytime voids #per day: 5 Night-time voids #per night: 2-3 Urgency: 0 Incontinence Episodes #per day: 1-2 Ankle used: left Treatment Setting: 3 Feeling/ Response: both Comments: patient tolerated well  Performed By: Fonnie Jarvis, CMA   Follow Up: 1 week

## 2020-03-10 NOTE — Patient Instructions (Signed)
Tracking Your Bladder Symptoms    Patient Name:___________________________________________________   Sample: Day   Daytime Voids  Nighttime Voids Urgency for the Day(0-4) Number of Accidents Beverage Comments  Monday IIII II 2 I Water IIII Coffee  I      Week Starting:____________________________________   Day Daytime  Voids Nighttime  Voids Urgency for  The Day(0-4) Number of Accidents Beverages Comments                                                           This week my symptoms were:  O much better  O better O the same O worse   

## 2020-03-11 DIAGNOSIS — L121 Cicatricial pemphigoid: Secondary | ICD-10-CM | POA: Diagnosis not present

## 2020-03-13 DIAGNOSIS — E871 Hypo-osmolality and hyponatremia: Secondary | ICD-10-CM | POA: Diagnosis not present

## 2020-03-13 DIAGNOSIS — N3000 Acute cystitis without hematuria: Secondary | ICD-10-CM | POA: Diagnosis not present

## 2020-03-13 DIAGNOSIS — R296 Repeated falls: Secondary | ICD-10-CM | POA: Diagnosis not present

## 2020-03-13 DIAGNOSIS — I2510 Atherosclerotic heart disease of native coronary artery without angina pectoris: Secondary | ICD-10-CM | POA: Diagnosis not present

## 2020-03-13 DIAGNOSIS — I129 Hypertensive chronic kidney disease with stage 1 through stage 4 chronic kidney disease, or unspecified chronic kidney disease: Secondary | ICD-10-CM | POA: Diagnosis not present

## 2020-03-13 DIAGNOSIS — E860 Dehydration: Secondary | ICD-10-CM | POA: Diagnosis not present

## 2020-03-17 ENCOUNTER — Ambulatory Visit: Payer: Self-pay

## 2020-03-17 NOTE — Telephone Encounter (Signed)
Appt rescheduled to 2/1 at 10am

## 2020-03-20 DIAGNOSIS — E860 Dehydration: Secondary | ICD-10-CM | POA: Diagnosis not present

## 2020-03-20 DIAGNOSIS — N3000 Acute cystitis without hematuria: Secondary | ICD-10-CM | POA: Diagnosis not present

## 2020-03-20 DIAGNOSIS — E871 Hypo-osmolality and hyponatremia: Secondary | ICD-10-CM | POA: Diagnosis not present

## 2020-03-20 DIAGNOSIS — I129 Hypertensive chronic kidney disease with stage 1 through stage 4 chronic kidney disease, or unspecified chronic kidney disease: Secondary | ICD-10-CM | POA: Diagnosis not present

## 2020-03-20 DIAGNOSIS — R296 Repeated falls: Secondary | ICD-10-CM | POA: Diagnosis not present

## 2020-03-20 DIAGNOSIS — I2510 Atherosclerotic heart disease of native coronary artery without angina pectoris: Secondary | ICD-10-CM | POA: Diagnosis not present

## 2020-03-24 ENCOUNTER — Other Ambulatory Visit: Payer: Self-pay

## 2020-03-24 ENCOUNTER — Emergency Department: Payer: Medicare Other

## 2020-03-24 ENCOUNTER — Ambulatory Visit (INDEPENDENT_AMBULATORY_CARE_PROVIDER_SITE_OTHER): Payer: Medicare Other | Admitting: *Deleted

## 2020-03-24 ENCOUNTER — Inpatient Hospital Stay
Admission: EM | Admit: 2020-03-24 | Discharge: 2020-03-31 | DRG: 951 | Disposition: E | Payer: Medicare Other | Attending: Internal Medicine | Admitting: Internal Medicine

## 2020-03-24 DIAGNOSIS — K219 Gastro-esophageal reflux disease without esophagitis: Secondary | ICD-10-CM | POA: Diagnosis present

## 2020-03-24 DIAGNOSIS — Z8551 Personal history of malignant neoplasm of bladder: Secondary | ICD-10-CM

## 2020-03-24 DIAGNOSIS — Z20822 Contact with and (suspected) exposure to covid-19: Secondary | ICD-10-CM | POA: Diagnosis not present

## 2020-03-24 DIAGNOSIS — L89151 Pressure ulcer of sacral region, stage 1: Secondary | ICD-10-CM | POA: Diagnosis present

## 2020-03-24 DIAGNOSIS — I609 Nontraumatic subarachnoid hemorrhage, unspecified: Secondary | ICD-10-CM

## 2020-03-24 DIAGNOSIS — Z515 Encounter for palliative care: Principal | ICD-10-CM

## 2020-03-24 DIAGNOSIS — R54 Age-related physical debility: Secondary | ICD-10-CM | POA: Diagnosis present

## 2020-03-24 DIAGNOSIS — Z820 Family history of epilepsy and other diseases of the nervous system: Secondary | ICD-10-CM

## 2020-03-24 DIAGNOSIS — H409 Unspecified glaucoma: Secondary | ICD-10-CM | POA: Diagnosis present

## 2020-03-24 DIAGNOSIS — N3281 Overactive bladder: Secondary | ICD-10-CM

## 2020-03-24 DIAGNOSIS — S0990XA Unspecified injury of head, initial encounter: Secondary | ICD-10-CM | POA: Diagnosis not present

## 2020-03-24 DIAGNOSIS — S066X9A Traumatic subarachnoid hemorrhage with loss of consciousness of unspecified duration, initial encounter: Secondary | ICD-10-CM | POA: Diagnosis not present

## 2020-03-24 DIAGNOSIS — W1830XA Fall on same level, unspecified, initial encounter: Secondary | ICD-10-CM | POA: Diagnosis present

## 2020-03-24 DIAGNOSIS — M47812 Spondylosis without myelopathy or radiculopathy, cervical region: Secondary | ICD-10-CM | POA: Diagnosis not present

## 2020-03-24 DIAGNOSIS — S065X9A Traumatic subdural hemorrhage with loss of consciousness of unspecified duration, initial encounter: Secondary | ICD-10-CM | POA: Diagnosis present

## 2020-03-24 DIAGNOSIS — S6992XA Unspecified injury of left wrist, hand and finger(s), initial encounter: Secondary | ICD-10-CM | POA: Diagnosis not present

## 2020-03-24 DIAGNOSIS — Z66 Do not resuscitate: Secondary | ICD-10-CM | POA: Diagnosis present

## 2020-03-24 DIAGNOSIS — S199XXA Unspecified injury of neck, initial encounter: Secondary | ICD-10-CM | POA: Diagnosis not present

## 2020-03-24 DIAGNOSIS — Z8249 Family history of ischemic heart disease and other diseases of the circulatory system: Secondary | ICD-10-CM

## 2020-03-24 DIAGNOSIS — S065X0A Traumatic subdural hemorrhage without loss of consciousness, initial encounter: Secondary | ICD-10-CM | POA: Diagnosis not present

## 2020-03-24 DIAGNOSIS — Z888 Allergy status to other drugs, medicaments and biological substances status: Secondary | ICD-10-CM

## 2020-03-24 DIAGNOSIS — S0219XA Other fracture of base of skull, initial encounter for closed fracture: Secondary | ICD-10-CM | POA: Diagnosis not present

## 2020-03-24 DIAGNOSIS — L8990 Pressure ulcer of unspecified site, unspecified stage: Secondary | ICD-10-CM | POA: Insufficient documentation

## 2020-03-24 DIAGNOSIS — Z9842 Cataract extraction status, left eye: Secondary | ICD-10-CM

## 2020-03-24 DIAGNOSIS — S0282XA Fracture of other specified skull and facial bones, left side, initial encounter for closed fracture: Secondary | ICD-10-CM | POA: Diagnosis not present

## 2020-03-24 DIAGNOSIS — N189 Chronic kidney disease, unspecified: Secondary | ICD-10-CM | POA: Diagnosis present

## 2020-03-24 DIAGNOSIS — R296 Repeated falls: Secondary | ICD-10-CM | POA: Diagnosis present

## 2020-03-24 DIAGNOSIS — S02119A Unspecified fracture of occiput, initial encounter for closed fracture: Secondary | ICD-10-CM | POA: Diagnosis present

## 2020-03-24 DIAGNOSIS — Z91040 Latex allergy status: Secondary | ICD-10-CM

## 2020-03-24 DIAGNOSIS — Z79899 Other long term (current) drug therapy: Secondary | ICD-10-CM

## 2020-03-24 DIAGNOSIS — I10 Essential (primary) hypertension: Secondary | ICD-10-CM | POA: Diagnosis not present

## 2020-03-24 DIAGNOSIS — S065XAA Traumatic subdural hemorrhage with loss of consciousness status unknown, initial encounter: Secondary | ICD-10-CM

## 2020-03-24 DIAGNOSIS — Z9841 Cataract extraction status, right eye: Secondary | ICD-10-CM

## 2020-03-24 DIAGNOSIS — Z90710 Acquired absence of both cervix and uterus: Secondary | ICD-10-CM

## 2020-03-24 DIAGNOSIS — Z803 Family history of malignant neoplasm of breast: Secondary | ICD-10-CM

## 2020-03-24 DIAGNOSIS — R4182 Altered mental status, unspecified: Secondary | ICD-10-CM | POA: Diagnosis not present

## 2020-03-24 DIAGNOSIS — I2510 Atherosclerotic heart disease of native coronary artery without angina pectoris: Secondary | ICD-10-CM | POA: Diagnosis present

## 2020-03-24 DIAGNOSIS — Z885 Allergy status to narcotic agent status: Secondary | ICD-10-CM

## 2020-03-24 DIAGNOSIS — Z801 Family history of malignant neoplasm of trachea, bronchus and lung: Secondary | ICD-10-CM

## 2020-03-24 DIAGNOSIS — I129 Hypertensive chronic kidney disease with stage 1 through stage 4 chronic kidney disease, or unspecified chronic kidney disease: Secondary | ICD-10-CM | POA: Diagnosis present

## 2020-03-24 DIAGNOSIS — Z87891 Personal history of nicotine dependence: Secondary | ICD-10-CM

## 2020-03-24 DIAGNOSIS — K920 Hematemesis: Secondary | ICD-10-CM | POA: Diagnosis present

## 2020-03-24 DIAGNOSIS — E785 Hyperlipidemia, unspecified: Secondary | ICD-10-CM | POA: Diagnosis present

## 2020-03-24 DIAGNOSIS — Z86010 Personal history of colonic polyps: Secondary | ICD-10-CM

## 2020-03-24 DIAGNOSIS — Z823 Family history of stroke: Secondary | ICD-10-CM

## 2020-03-24 DIAGNOSIS — Z800 Family history of malignant neoplasm of digestive organs: Secondary | ICD-10-CM

## 2020-03-24 LAB — BASIC METABOLIC PANEL
Anion gap: 12 (ref 5–15)
BUN: 15 mg/dL (ref 8–23)
CO2: 22 mmol/L (ref 22–32)
Calcium: 9 mg/dL (ref 8.9–10.3)
Chloride: 103 mmol/L (ref 98–111)
Creatinine, Ser: 0.6 mg/dL (ref 0.44–1.00)
GFR, Estimated: >60 mL/min (ref >60)
Glucose, Bld: 157 mg/dL — ABNORMAL HIGH (ref 70–99)
Potassium: 4.5 mmol/L (ref 3.5–5.1)
Sodium: 137 mmol/L (ref 135–145)

## 2020-03-24 LAB — CBC
HCT: 32.2 % — ABNORMAL LOW (ref 36.0–46.0)
Hemoglobin: 10.3 g/dL — ABNORMAL LOW (ref 12.0–15.0)
MCH: 29 pg (ref 26.0–34.0)
MCHC: 32 g/dL (ref 30.0–36.0)
MCV: 90.7 fL (ref 80.0–100.0)
Platelets: 247 10*3/uL (ref 150–400)
RBC: 3.55 MIL/uL — ABNORMAL LOW (ref 3.87–5.11)
RDW: 15.4 % (ref 11.5–15.5)
WBC: 18.9 10*3/uL — ABNORMAL HIGH (ref 4.0–10.5)
nRBC: 0 % (ref 0.0–0.2)

## 2020-03-24 LAB — PROTIME-INR
INR: 1.2 (ref 0.8–1.2)
Prothrombin Time: 14.7 seconds (ref 11.4–15.2)

## 2020-03-24 MED ORDER — ONDANSETRON HCL 4 MG/2ML IJ SOLN: 4.0000 mg | Freq: Four times a day (QID) | INTRAMUSCULAR | Status: DC | PRN

## 2020-03-24 MED ORDER — MORPHINE SULFATE (CONCENTRATE) 10 MG/0.5ML PO SOLN
5.0000 mg | ORAL | Status: DC | PRN
Start: 2020-03-24 — End: 2020-03-27

## 2020-03-24 MED ORDER — HALOPERIDOL LACTATE 5 MG/ML IJ SOLN: 0.5000 mg | INTRAMUSCULAR | Status: DC | PRN

## 2020-03-24 MED ORDER — ACETAMINOPHEN 325 MG PO TABS: 650.0000 mg | ORAL_TABLET | Freq: Four times a day (QID) | ORAL | Status: DC | PRN

## 2020-03-24 MED ORDER — ACETAMINOPHEN 650 MG RE SUPP
650.0000 mg | Freq: Four times a day (QID) | RECTAL | Status: DC | PRN
  Administered 2020-03-26: 650 mg via RECTAL
  Filled 2020-03-24: qty 1

## 2020-03-24 MED ORDER — POLYVINYL ALCOHOL 1.4 % OP SOLN
1.0000 [drp] | Freq: Four times a day (QID) | OPHTHALMIC | Status: DC | PRN
  Filled 2020-03-24: qty 15

## 2020-03-24 MED ORDER — GLYCOPYRROLATE 1 MG PO TABS
1.0000 mg | ORAL_TABLET | ORAL | Status: DC | PRN
  Filled 2020-03-24: qty 1

## 2020-03-24 MED ORDER — PROCHLORPERAZINE 25 MG RE SUPP
25.0000 mg | Freq: Two times a day (BID) | RECTAL | Status: DC | PRN
  Filled 2020-03-24: qty 1

## 2020-03-24 MED ORDER — ONDANSETRON 4 MG PO TBDP: 4.0000 mg | ORAL_TABLET | Freq: Four times a day (QID) | ORAL | Status: DC | PRN

## 2020-03-24 MED ORDER — MORPHINE SULFATE (CONCENTRATE) 10 MG/0.5ML PO SOLN: 5.0000 mg | ORAL | Status: DC | PRN

## 2020-03-24 MED ORDER — HALOPERIDOL LACTATE 2 MG/ML PO CONC
0.5000 mg | ORAL | Status: DC | PRN
  Filled 2020-03-24: qty 0.3

## 2020-03-24 MED ORDER — LORAZEPAM 2 MG/ML IJ SOLN
1.0000 mg | INTRAMUSCULAR | Status: DC | PRN
  Filled 2020-03-24: qty 1

## 2020-03-24 MED ORDER — PROCHLORPERAZINE EDISYLATE 10 MG/2ML IJ SOLN
10.0000 mg | Freq: Two times a day (BID) | INTRAMUSCULAR | Status: DC | PRN
  Filled 2020-03-24: qty 2

## 2020-03-24 MED ORDER — HALOPERIDOL 0.5 MG PO TABS
0.5000 mg | ORAL_TABLET | ORAL | Status: DC | PRN
  Filled 2020-03-24: qty 1

## 2020-03-24 MED ORDER — LORAZEPAM 2 MG/ML IJ SOLN
1.0000 mg | INTRAMUSCULAR | Status: DC | PRN
  Administered 2020-03-25: 1 mg via INTRAVENOUS

## 2020-03-24 MED ORDER — LORAZEPAM 1 MG PO TABS: 1.0000 mg | ORAL_TABLET | ORAL | Status: DC | PRN

## 2020-03-24 MED ORDER — ACETAMINOPHEN 650 MG RE SUPP: 650.0000 mg | Freq: Four times a day (QID) | RECTAL | Status: DC | PRN

## 2020-03-24 MED ORDER — ONDANSETRON HCL 4 MG/2ML IJ SOLN
INTRAMUSCULAR | Status: AC
  Administered 2020-03-24: 4 mg via INTRAVENOUS
  Filled 2020-03-24: qty 2

## 2020-03-24 MED ORDER — LORAZEPAM 2 MG/ML PO CONC
1.0000 mg | ORAL | Status: DC | PRN
  Filled 2020-03-24: qty 0.5

## 2020-03-24 MED ORDER — LOPERAMIDE HCL 2 MG PO CAPS
2.0000 mg | ORAL_CAPSULE | ORAL | Status: DC | PRN
  Filled 2020-03-24: qty 1

## 2020-03-24 MED ORDER — ONDANSETRON HCL 4 MG/2ML IJ SOLN: 4.0000 mg | Freq: Once | INTRAMUSCULAR | Status: AC

## 2020-03-24 MED ORDER — DIPHENHYDRAMINE HCL 50 MG/ML IJ SOLN: 12.5000 mg | INTRAMUSCULAR | Status: DC | PRN

## 2020-03-24 MED ORDER — GLYCOPYRROLATE 0.2 MG/ML IJ SOLN
0.2000 mg | INTRAMUSCULAR | Status: DC | PRN
  Filled 2020-03-24: qty 1

## 2020-03-24 MED ORDER — BIOTENE DRY MOUTH MT LIQD
15.0000 mL | OROMUCOSAL | Status: DC | PRN
  Filled 2020-03-24: qty 15

## 2020-03-24 MED ORDER — SENNA 8.6 MG PO TABS: 1.0000 | ORAL_TABLET | Freq: Every evening | ORAL | Status: DC | PRN

## 2020-03-24 MED ORDER — HYDROMORPHONE HCL 1 MG/ML IJ SOLN
0.5000 mg | INTRAMUSCULAR | Status: DC | PRN
  Administered 2020-03-25 (×4): 0.5 mg via INTRAVENOUS
  Filled 2020-03-24 (×4): qty 1

## 2020-03-24 MED ORDER — PROCHLORPERAZINE MALEATE 5 MG PO TABS
5.0000 mg | ORAL_TABLET | Freq: Four times a day (QID) | ORAL | Status: DC | PRN
  Filled 2020-03-24: qty 1

## 2020-03-24 NOTE — H&P (Signed)
History and Physical   Victoria Allison [id number] DOB: [birth year] DOA: 03/12/2020  PCP: Abner Greenspan, MD  Outpatient Specialists: Dr. Bjorn Loser, [facility] Patient coming from: home  I have personally briefly reviewed patient's old medical records in [place].  Chief Concern: head trauma from ground-level fall  HPI: Victoria Allison is a 80 y.o. female with medical history significant for hypertension, overactive bladder, history of bladder cancer currently follows with urology for surveillance, bilateral glaucoma, CKD, diverticulosis, CAD, presented to the emergency department via private vehicle for chief concerns of fall.  HPI obtained by daughter at bedside.  Daughter reports that patient at baseline performs all her ADLs lives at home by herself.  She was called to her patient's home because a neighbor noticed that patient had driven home and left her car door open with bags of groceries.  When neighbor came over to try to help patient bring groceries into the house he had noticed that she was laying on her back with blood on the ground.  He called daughter to come and daughter arrived.  Daughter states that patient was not responsive and that she saw bleeding from patient's left ear and bleeding from the back of her head.  Daughter brought patient to the emergency department and patient was found to be minimally responsive.  Daughter does not know how long patient lost consciousness.  In the emergency department patient was pale, appeared frail, and vomited.  In the emergency department CT of the head without contrast was ordered and radiology read was extensive changes related to closed head injury.  Hemorrhagic contusions both frontal lobes and left temporal tip.  Intra parenchymal hematoma also in the left parietal region.  Traumatic subarachnoid hemorrhage.  Subdural hematoma on the left with maximal thickness of 16 mm.  Mass-effect with left to right shift of  approximately 12 mm.  Longitudinal temporal bone fracture of the left with otic capsule breech.  Diastases of the left lambdoid suture.  Left occipital skull fracture.  Traumatic fluid in the left temporal bone airspaces.  Intracranial air, both subdural and subarachnoid, related to the open left parietal occipital injury.  ED Dr. Discussion with family and patient was made DNR and then transition to comfort care.  ROS: Unable to be performed as patient was minimally responsive and lethargic and due to multiple head abnormalities and acute mental status changes from traumatic head injury.  ED Course: Discussed with ED provider, patient required hospitalization for end-of-life care/comfort care.  Assessment/Plan  Active Problems:   Admission for end of life care   Admission for end-of-life care-secondary to ground-level call fall with multiple head injuries as stated in the CT of the head without contrast -Patient presenting with mental status change secondary to ground-level fall and resultant multiple head injuries including subdural hematoma and traumatic subarachnoid hemorrhage, mass-effect with left-to-right shift, temporal bone fracture, hemorrhagic contusions of both frontal lobes and left temporal tip, traumatic fluid in the left temporal bone airspaces, intracranial air both subdural and subarachnoid -After long discussion in the ED at [facility], family has decided to proceed with comfort care only -We have admitted patient to [facility] palliative care level of service for comfort care and palliative care was consulted -Patient is likely to be a candidate for [facility] or other residential hospice for inpatient comfort care  -Comfort care order set utilized -No antibiotics or IVF as per family's request -Pain control with morphine oral solution and Dilaudid injection as needed, would  transition to a drip if needed but patient does not appear to be in pain at this time   DVT prophylaxis: None  - comfort measures Code Status: DNR - confirmed with family Family Communication: Daughter at bedside updated Disposition Plan: Anticipate in-hospital death Consults called: Palliative  Admission status: Admit - to observation to palliative care  Past Medical History:  Diagnosis Date  . Allergic rhinitis   . CAD (coronary artery disease)   . Cancer ([place])    skin  . Chronic kidney disease    overactive bladder  . Colon polyp   . Diverticulosis    sigmoid and descending colon  . Elevated LFTs over one year ago  . Esophageal ring   . GERD (gastroesophageal reflux disease)   . History of hepatitis 1975   unsure of type  . Hyperlipemia   . Hypertension   . Internal hemorrhoids   . Lichen sclerosus [id number]   (DX BY GYN AFTER ABN PAP)  . Pemphigoid, cicatricial    D/O AFFECTING EYES, MOUTH, THROAT NOW (DX [facility])  . Shingles 03/13/2013  . Urinary incontinence    OVERACTIVE BLADDER   Past Surgical History:  Procedure Laterality Date  . ABDOMINAL HYSTERECTOMY    . ANGIOPLASTY  yrs ago   LAD  . CARDIAC ELECTROPHYSIOLOGY MAPPING AND ABLATION    . CATARACT EXTRACTION Bilateral   . COLONOSCOPY    . CYSTOSCOPY WITH BIOPSY N/A 03/22/2019   Procedure: CYSTOSCOPY WITH BLADDER BIOPSY WITH GEMCITABINE;  Surgeon: Billey Co, MD;  Location: [facility] ORS;  Service: Urology;  Laterality: N/A;  . CYSTOSCOPY WITH FULGERATION N/A 03/22/2019   Procedure: CYSTOSCOPY WITH FULGERATION;  Surgeon: Billey Co, MD;  Location: [facility] OR[facility];  Service: Urology;  Laterality: N/A;  . ESOPHAGOGASTRODUODENOSCOPY  09/14/2011   Procedure: ESOPHAGOGASTRODUODENOSCOPY (EGD);  Surgeon: Gatha Mayer, MD;  Location: [facility];  Service: Endoscopy;  Laterality: N/A;  needs xray  . ESOPHAGOGASTRODUODENOSCOPY N/A 01/02/2013   Procedure: ESOPHAGOGASTRODUODENOSCOPY (EGD);  Surgeon: Gatha Mayer, MD;  Location: [facility];  Service: Endoscopy;  Laterality: N/A;  . ESOPHAGOGASTRODUODENOSCOPY N/A 04/12/2013    Procedure: ESOPHAGOGASTRODUODENOSCOPY (EGD);  Surgeon: Gatha Mayer, MD;  Location: [facility];  Service: Endoscopy;  Laterality: N/A;  . ESOPHAGOGASTRODUODENOSCOPY (EGD) WITH PROPOFOL N/A 05/24/2016   Procedure: ESOPHAGOGASTRODUODENOSCOPY (EGD) WITH PROPOFOL;  Surgeon: Gatha Mayer, MD;  Location: [facility];  Service: Endoscopy;  Laterality: N/A;  . ESOPHAGOGASTRODUODENOSCOPY (EGD) WITH PROPOFOL N/A 08/22/2017   Procedure: ESOPHAGOGASTRODUODENOSCOPY (EGD) WITH PROPOFOL;  Surgeon: Gatha Mayer, MD;  Location: [facility];  Service: Endoscopy;  Laterality: N/A;  . ESOPHAGOGASTRODUODENOSCOPY (EGD) WITH PROPOFOL N/A 02/26/2019   Procedure: ESOPHAGOGASTRODUODENOSCOPY (EGD) WITH PROPOFOL;  Surgeon: Gatha Mayer, MD;  Location: [facility];  Service: Endoscopy;  Laterality: N/A;  . Venia Minks DILATION N/A 01/02/2013   Procedure: Venia Minks DILATION;  Surgeon: Gatha Mayer, MD;  Location: [facility];  Service: Endoscopy;  Laterality: N/A;  . Venia Minks DILATION N/A 05/24/2016   Procedure: Venia Minks DILATION;  Surgeon: Gatha Mayer, MD;  Location: [facility];  Service: Endoscopy;  Laterality: N/A;  . Venia Minks DILATION N/A 08/22/2017   Procedure: Venia Minks DILATION;  Surgeon: Gatha Mayer, MD;  Location: [facility];  Service: Endoscopy;  Laterality: N/A;  Venia Minks DILATION  02/26/2019   Procedure: Venia Minks DILATION;  Surgeon: Gatha Mayer, MD;  Location: [facility];  Service: Endoscopy;;  . NOSE SURGERY     Biopsy  . OOPHORECTOMY    . SAVORY DILATION  09/14/2011  Procedure: SAVORY DILATION;  Surgeon: Gatha Mayer, MD;  Location: [facility];  Service: Endoscopy;  Laterality: N/A;  . SAVORY DILATION N/A 01/02/2013   Procedure: SAVORY DILATION;  Surgeon: Gatha Mayer, MD;  Location: [facility];  Service: Endoscopy;  Laterality: N/A;  . SAVORY DILATION N/A 04/12/2013   Procedure: SAVORY DILATION;  Surgeon: Gatha Mayer, MD;  Location: [facility];  Service: Endoscopy;  Laterality:  N/A;  . skin cancer removal  02/2016   Social History:  reports that she quit smoking about 52 years ago. She has never used smokeless tobacco. She reports that she does not drink alcohol and does not use drugs.  Allergies  Allergen Reactions  . Adhesive [Tape] Rash    Paper tape and tegaderm OK  . Aspirin Other (See Comments)    Bleeds too easily  . Latex Itching and Rash  . Lipitor [Atorvastatin] Other (See Comments)    Elevated LFT's  . Tetanus Toxoid Rash  . Ultram [Tramadol] Other (See Comments)    Dizziness   Family History  Problem Relation Age of Onset  . Lung cancer Father   . Esophageal cancer Son 67  . Stomach cancer Son 54  . Stroke Mother        CVA  . Alzheimer's disease Mother   . Hypertension Brother   . Breast cancer Maternal Aunt   . Colon cancer Neg Hx   . Rectal cancer Neg Hx   . Colon polyps Neg Hx    Family history: Family history reviewed and not pertinent  Prior to Admission medications   Medication Sig Start Date End Date Taking? Authorizing Provider  acetaminophen (TYLENOL) 500 MG tablet Take 500 mg by mouth daily as needed for moderate pain or headache.     [provider]  bimatoprost (LUMIGAN) 0.03 % ophthalmic solution 1 drop at bedtime.    [provider]  dexamethasone 0.5 MG/5ML elixir Take by mouth. 12/23/19   [provider]  fluticasone (FLONASE) 50 MCG/ACT nasal spray Place 2 sprays into both nostrils daily as needed for allergies. Patient taking differently: Place 2 sprays into both nostrils daily. 07/19/17   Tower, Wynelle Fanny, MD  lisinopril (ZESTRIL) 10 MG tablet Take 1 tablet (10 mg total) by mouth daily. 01/27/20   Naaman Plummer, MD  omeprazole (PRILOSEC) 40 MG capsule Take 1 capsule by mouth once daily 03/03/20   Tower, Wynelle Fanny, MD  pravastatin (PRAVACHOL) 40 MG tablet Take 1 tablet (40 mg total) by mouth daily. 01/27/20   Naaman Plummer, MD  sodium chloride (OCEAN) 0.65 % SOLN nasal spray Place 1 spray  into both nostrils daily as needed for congestion.     [provider]  timolol (TIMOPTIC) 0.5 % ophthalmic solution 1 drop 2 (two) times daily. 11/29/19   [provider]   Physical Exam: Vitals:   03/27/2020 1830 03/14/2020 1845 03/25/2020 1850 03/12/2020 2010  BP:   (!) 145/68 (!) 149/68  Pulse: 87 88 93 90  Resp: (!) 44 (!) 42 (!) 44 (!) 44  Temp:      SpO2: 100% 100% 100% 100%   Constitutional: appears age appropriate, NAD, minimally responsive, lethargic, frail Eyes: PERRL, lids and conjunctivae normal ENMT: Mucous membranes are moist. Posterior pharynx clear of any exudate or lesions. Age-appropriate dentition. Bleeding from left ear Neck: normal, supple, no masses, no thyromegaly Respiratory: Rhonchus breathing bilaterally, no wheezing, no crackles. Increased respiratory effort. Increased accessory muscle use.  Cardiovascular: Regular rate and  rhythm, no murmurs / rubs / gallops. No extremity edema. 2+ pedal pulses. No carotid bruits.  Abdomen: no tenderness, no masses palpated, no hepatosplenomegaly. Bowel sounds positive.  Musculoskeletal: no clubbing / cyanosis. No joint deformity upper and lower extremities. Good ROM, no contractures, no atrophy. Normal muscle tone. Bleeding from right upper extremity with gauze wrap in place Skin: no rashes, lesions, ulcers. No induration, bilaterall scraps and bleeding from upper extremity on admission with gauze wrap in placr Neurologic: Minimally responsive.  Psychiatric: Not waking up, lethargic  EKG: independently reviewed, showing sinus rhythm with rate of 89, QTc 447  CT head w/o contrast on Admission: I personally reviewed and I agree with radiologist reading as below.  DG Wrist Complete Left  Result Date: 04-07-2020 CLINICAL DATA:  Fall.  Left wrist injury. EXAM: LEFT WRIST - COMPLETE 3+ VIEW COMPARISON:  02/18/2007. FINDINGS: Diffuse degenerative change. Cartilaginous calcification noted, this is most likely degenerative.  No evidence of fracture or dislocation. IMPRESSION: Diffuse degenerative change. No acute abnormality. Electronically Signed   By: Marcello Moores  Register   On: 04-07-20 15:33   CT HEAD WO CONTRAST  Result Date: April 07, 2020 CLINICAL DATA:  Found on the ground. Altered mental status. Bleeding from the left ear. EXAM: CT HEAD WITHOUT CONTRAST TECHNIQUE: Contiguous axial images were obtained from the base of the skull through the vertex without intravenous contrast. COMPARISON:  None. FINDINGS: Brain: Extensive changes related to close head injury. Hemorrhagic contusions of both frontal lobes in the left temporal tip. Traumatic subarachnoid hemorrhage. Subdural hematoma on the left along the convexity with maximal thickness of 16 mm. Mass effect with left-to-right shift of 12 mm. Intraparenchymal hematoma also in the left parietal region. Small amount of air in the subarachnoid space and also in the subdural space in that region, consistent with open skull fracture. See below. No hydrocephalus/ventricular trapping. Vascular: No primary vascular finding. Skull: Longitudinal temporal bone fracture on the left with otic capsule breach. Left occipital skull fracture. Diastasis of the left lambdoid suture. This is an open injury as noted above. Sinuses/Orbits: Fluid in the paranasal sinuses. Traumatic fluid in the left temporal bone airspaces. Other: None IMPRESSION: 1. Extensive changes related to closed head injury. Hemorrhagic contusions of both frontal lobes and the left temporal tip. Intraparenchymal hematoma also in the left parietal region. Traumatic subarachnoid hemorrhage. Subdural hematoma on the left with maximal thickness of 16 mm. Mass effect with left-to-right shift of 12 mm. 2. Longitudinal temporal bone fracture on the left with otic capsule breach. Diastasis of the left lambdoid suture. Left occipital skull fracture. Traumatic fluid in the left temporal bone airspaces. 3. Intracranial air, both subdural and  subarachnoid, related to the open left parietooccipital injury. 4. These results were called by telephone at the time of interpretation on April 07, 2020 at 3:16 pm to provider Merlyn Lot , who verbally acknowledged these results. Electronically Signed   By: Nelson Chimes M.D.   On: Apr 07, 2020 15:16   CT Cervical Spine Wo Contrast  Result Date: 04-07-2020 CLINICAL DATA:  [place], blood from left external auditory canal EXAM: CT CERVICAL SPINE WITHOUT CONTRAST TECHNIQUE: Multidetector CT imaging of the cervical spine was performed without intravenous contrast. Multiplanar CT image reconstructions were also generated. COMPARISON:  None. FINDINGS: Alignment: Alignment is anatomic. Skull base and vertebrae: No acute displaced cervical spine fracture. Comminuted fracture is seen through the left temporal bone and mastoid air cells. Please refer to dedicated head CT report for full description of intracranial findings. Soft tissues and spinal  canal: Parenchymal contusion and pneumocephalus are seen within the left middle cranial fossa and posterior fossa. Please refer to dedicated head CT report for full description of findings. No prevertebral fluid or swelling. No visible canal hematoma. Disc levels: Multilevel cervical spondylosis most pronounced at C6-7. Multilevel facet hypertrophy asymmetric on the left from C2 through C6. Mild left neural foraminal narrowing seen at C3-4, C4-5, and C5-6, with minimal symmetrical narrowing seen at C6-7. Upper chest: Airway is patent. Minimal ground-glass airspace disease within the left upper lobe may be inflammatory or infectious. Other: Reconstructed images demonstrate no additional findings. IMPRESSION: 1. No acute cervical spine fracture. 2. Multilevel spondylosis and facet hypertrophy as above. 3. Comminuted fractures through the left external auditory canal, temporal bone, and mastoid air cells, with parenchymal contusions and pneumocephalus as above. Please refer to  dedicated head CT report. Electronically Signed   By: Randa Ngo M.D.   On: 03/06/2020 15:17   CT Maxillofacial Wo Contrast  Result Date: 03/23/2020 CLINICAL DATA:  [place], found down, blood at left external auditory canal EXAM: CT MAXILLOFACIAL WITHOUT CONTRAST TECHNIQUE: Multidetector CT imaging of the maxillofacial structures was performed. Multiplanar CT image reconstructions were also generated. COMPARISON:  None. FINDINGS: Osseous: There is a displaced fracture through the left occiput put, partially visualized as the superior extent is not included due to slice selection. Fracture extends inferiorly through the left temporal bone, with a longitudinal fracture through the left temporal bone and mastoid air cells. Dedicated temporal bone CT may be useful when clinical situation permits. Transverse fracture is seen through the roof of the left external auditory canal. No other acute facial bone fractures.  The mandible is unremarkable. Orbits: Negative. No traumatic or inflammatory finding. Sinuses: There is near total opacification of the bilateral maxillary and frontal sinuses with superimposed gas fluid levels. Mucosal thickening within the ethmoid air cells and sphenoid sinus. Soft tissues: Scalp hematoma left occipital region. Remaining soft tissues are unremarkable. Limited intracranial: Large left-sided subdural hematoma and anterior falcine subdural hematoma partially visualized, significant mass effect. Parenchymal contusion along the inferior aspect of the left temporal lobe and bilateral frontal lobes also noted. Areas of subarachnoid hemorrhage are seen within the left middle cranial fossa, and bilateral anterior cranial fossa. Small foci of pneumocephalus within the left middle cranial fossa. Please refer to head CT report. IMPRESSION: 1. Comminuted fracture through the left temporal bone and occiput. Dedicated temporal bone CT may be useful when clinical situation permits. 2. Extensive  subdural hematoma, subarachnoid hemorrhage, and parenchymal contusions as above. Please refer to dedicated head CT report. 3. No acute facial bone fracture. 4. Diffuse paranasal sinus opacification. Electronically Signed   By: Randa Ngo M.D.   On: 03/03/2020 15:13   Labs on Admission: I have personally reviewed following labs  CBC: Recent Labs  Lab 03/20/2020 1452  WBC 18.9*  HGB 10.3*  HCT 32.2*  MCV 90.7  PLT 725   Basic Metabolic Panel: Recent Labs  Lab 03/23/2020 1452  NA 137  K 4.5  CL 103  CO2 22  GLUCOSE 157*  BUN 15  CREATININE 0.60  CALCIUM 9.0   Coagulation Profile: Recent Labs  Lab 03/06/2020 1454  INR 1.2   Urine analysis:    Component Value Date/Time   COLORURINE AMBER (A) 01/27/2020 1135   APPEARANCEUR Cloudy (A) 02/10/2020 1001   LABSPEC 1.021 01/27/2020 1135   PHURINE 5.0 01/27/2020 1135   GLUCOSEU Negative 02/10/2020 1001   HGBUR SMALL (A) 01/27/2020 1135  HGBUR trace-intact 09/21/2007 1119   BILIRUBINUR Negative 02/10/2020 1001   KETONESUR NEGATIVE 01/27/2020 1135   PROTEINUR Negative 02/10/2020 1001   PROTEINUR 100 (A) 01/27/2020 1135   UROBILINOGEN 0.2 01/29/2020 1550   UROBILINOGEN 0.2 08/26/2009 0833   NITRITE Negative 02/10/2020 1001   NITRITE NEGATIVE 01/27/2020 1135   LEUKOCYTESUR 1+ (A) 02/10/2020 1001   LEUKOCYTESUR TRACE (A) 01/27/2020 1135   Yafet Cline N Dyllan Hughett D.O. [facility]  If 7PM-7AM, please contact overnight-coverage provider If 7AM-7PM, please contact day coverage provider [email or web address]  03/27/2020, 8:57 PM

## 2020-03-24 NOTE — Progress Notes (Signed)
PTNS  Session # 10  Health & Social Factors: no change Caffeine: 0 Alcohol: 0 Daytime voids #per day: 5 Night-time voids #per night: 2-3 Urgency: 0 Incontinence Episodes #per day: 1-2 Ankle used: left Treatment Setting: 3 Feeling/ Response: both Comments: patient tolerated well  Performed By Gaspar Cola CMA   Follow Up: 1 week

## 2020-03-24 NOTE — ED Notes (Signed)
Patient transported to CT 

## 2020-03-24 NOTE — ED Triage Notes (Addendum)
Pt comes via POV from home with c/o fall. Family states [facility] called and found pt on ground.  Pt had fallen onto concrete patio.  Pt unable to communicate at this time. Pt has blood coming from left ear, abrasion to nose and face, blood to back of head and open wound to left wrist. Left wrist bandaged at this time and bleeding controlled.  Pt on blood thinners. Unknown of LOC.  Pt is pale and beginning to vomit

## 2020-03-24 NOTE — Progress Notes (Signed)
Chaplain offered prayer of comfort for patient and family, spiritual and emotional support.

## 2020-03-24 NOTE — ED Provider Notes (Signed)
[facility] Emergency Department Provider Note    Event Date/Time   First MD Initiated Contact with Patient 03/07/2020 1448     (approximate)  I have reviewed the triage vital signs and the nursing notes.   HISTORY  Chief Complaint Fall  Level V Caveat:  AMS  HPI Victoria Allison is a 80 y.o. female extensive past medical history as listed below presents to the ER after being found down.  She is accompanied by her daughter who brought her to the ER.  Was found lying on any concrete patio.  Assumed to have had a ground-level fall did strike the back of her head and was found in a pool of blood.  Becoming increasingly confused and less responsive.  Does have blood coming from the left ear canal.  Not cooperative with exam.  1 episode of hematemesis.  Daughter states the patient is a DNR and DNI.  Reports she thinks she is on blood thinners but cannot recall the type or amount.    Past Medical History:  Diagnosis Date  . Allergic rhinitis   . CAD (coronary artery disease)   . Cancer (HCC)    skin  . Chronic kidney disease    overactive bladder  . Colon polyp   . Diverticulosis    sigmoid and descending colon  . Elevated LFTs over one year ago  . Esophageal ring   . GERD (gastroesophageal reflux disease)   . History of hepatitis 1975   unsure of type  . Hyperlipemia   . Hypertension   . Internal hemorrhoids   . Lichen sclerosus 05/09   (DX BY GYN AFTER ABN PAP)  . Pemphigoid, cicatricial    D/O AFFECTING EYES, MOUTH, THROAT NOW (DX [facility])  . Shingles 03/13/2013  . Urinary incontinence    OVERACTIVE BLADDER   Family History  Problem Relation Age of Onset  . Lung cancer Father   . Esophageal cancer Son 15  . Stomach cancer Son 41  . Stroke Mother        CVA  . Alzheimer's disease Mother   . Hypertension Brother   . Breast cancer Maternal Aunt   . Colon cancer Neg Hx   . Rectal cancer Neg Hx   . Colon polyps Neg Hx    Past Surgical History:   Procedure Laterality Date  . ABDOMINAL HYSTERECTOMY    . ANGIOPLASTY  yrs ago   LAD  . CARDIAC ELECTROPHYSIOLOGY MAPPING AND ABLATION    . CATARACT EXTRACTION Bilateral   . COLONOSCOPY    . CYSTOSCOPY WITH BIOPSY N/A 03/22/2019   Procedure: CYSTOSCOPY WITH BLADDER BIOPSY WITH GEMCITABINE;  Surgeon: Sondra Come, MD;  Location: [facility] ORS;  Service: Urology;  Laterality: N/A;  . CYSTOSCOPY WITH FULGERATION N/A 03/22/2019   Procedure: CYSTOSCOPY WITH FULGERATION;  Surgeon: Sondra Come, MD;  Location: [facility];  Service: Urology;  Laterality: N/A;  . ESOPHAGOGASTRODUODENOSCOPY  09/14/2011   Procedure: ESOPHAGOGASTRODUODENOSCOPY (EGD);  Surgeon: Iva Boop, MD;  Location: [facility];  Service: Endoscopy;  Laterality: N/A;  needs xray  . ESOPHAGOGASTRODUODENOSCOPY N/A 01/02/2013   Procedure: ESOPHAGOGASTRODUODENOSCOPY (EGD);  Surgeon: Iva Boop, MD;  Location: [facility];  Service: Endoscopy;  Laterality: N/A;  . ESOPHAGOGASTRODUODENOSCOPY N/A 04/12/2013   Procedure: ESOPHAGOGASTRODUODENOSCOPY (EGD);  Surgeon: Iva Boop, MD;  Location: [facility];  Service: Endoscopy;  Laterality: N/A;  . ESOPHAGOGASTRODUODENOSCOPY (EGD) WITH PROPOFOL N/A 05/24/2016   Procedure: ESOPHAGOGASTRODUODENOSCOPY (EGD) WITH PROPOFOL;  Surgeon: Maryjean Morn  Carlean Purl, MD;  Location: [facility];  Service: Endoscopy;  Laterality: N/A;  . ESOPHAGOGASTRODUODENOSCOPY (EGD) WITH PROPOFOL N/A 08/22/2017   Procedure: ESOPHAGOGASTRODUODENOSCOPY (EGD) WITH PROPOFOL;  Surgeon: Gatha Mayer, MD;  Location: [facility];  Service: Endoscopy;  Laterality: N/A;  . ESOPHAGOGASTRODUODENOSCOPY (EGD) WITH PROPOFOL N/A 02/26/2019   Procedure: ESOPHAGOGASTRODUODENOSCOPY (EGD) WITH PROPOFOL;  Surgeon: Gatha Mayer, MD;  Location: [facility];  Service: Endoscopy;  Laterality: N/A;  . Venia Minks DILATION N/A 01/02/2013   Procedure: Venia Minks DILATION;  Surgeon: Gatha Mayer, MD;  Location: [facility];  Service: Endoscopy;   Laterality: N/A;  . Venia Minks DILATION N/A 05/24/2016   Procedure: Venia Minks DILATION;  Surgeon: Gatha Mayer, MD;  Location: [facility];  Service: Endoscopy;  Laterality: N/A;  . Venia Minks DILATION N/A 08/22/2017   Procedure: Venia Minks DILATION;  Surgeon: Gatha Mayer, MD;  Location: [facility];  Service: Endoscopy;  Laterality: N/A;  Venia Minks DILATION  02/26/2019   Procedure: Venia Minks DILATION;  Surgeon: Gatha Mayer, MD;  Location: [facility];  Service: Endoscopy;;  . NOSE SURGERY     Biopsy  . OOPHORECTOMY    . SAVORY DILATION  09/14/2011   Procedure: SAVORY DILATION;  Surgeon: Gatha Mayer, MD;  Location: [facility];  Service: Endoscopy;  Laterality: N/A;  . SAVORY DILATION N/A 01/02/2013   Procedure: SAVORY DILATION;  Surgeon: Gatha Mayer, MD;  Location: [facility];  Service: Endoscopy;  Laterality: N/A;  . SAVORY DILATION N/A 04/12/2013   Procedure: SAVORY DILATION;  Surgeon: Gatha Mayer, MD;  Location: [facility];  Service: Endoscopy;  Laterality: N/A;  . skin cancer removal  02/2016   Patient Active Problem List   Diagnosis Date Noted  . Hyponatremia 01/29/2020  . Pre-operative examination 03/13/2019  . UTI (urinary tract infection) 11/07/2018  . Hoarseness, persistent 10/30/2018  . Constipation 08/07/2017  . Screening mammogram, encounter for 07/19/2017  . Elevated glucose 07/19/2017  . Colon cancer screening 12/13/2013  . Dysphagia 04/12/2013  . Encounter for Medicare annual wellness exam 12/10/2012  . Bladder neoplasm of uncertain malignant potential 05/09/2012  . Chronic cystitis 05/09/2012  . 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 Incomplete emptying of bladder 05/09/2012  . Symptoms involving urinary system 05/09/2012  . Esophageal rings 08/24/2011  . Personal history of malignant neoplasm of skin 08/01/2011  . Squamous cell carcinoma of skin of face 01/17/2011  . Neoplasm of connective tissue 12/06/2010  . History of supraventricular tachycardia  09/22/2009  . ARTHRITIS, SHOULDER 05/21/2009  . INSOMNIA 02/05/2009  . POSTMENOPAUSAL STATUS 06/02/2008  . PEMPHIGOID 11/14/2007  . Nonspecific (abnormal) findings on radiological and other examination of body structure 07/05/2007  . Hyperlipidemia LDL goal <100 03/13/2007  . Essential hypertension 03/13/2007  . Coronary atherosclerosis 03/13/2007  . Allergic rhinitis 03/13/2007  . GERD 03/13/2007  . HAIR LOSS 03/13/2007  . LEG PAIN, CHRONIC 03/13/2007  . URINARY INCONTINENCE 03/13/2007  . COLONIC POLYPS, HX OF 03/13/2007  . HX, URINARY INFECTION 08/14/2006      Prior to Admission medications   Medication Sig Start Date End Date Taking? Authorizing Provider  acetaminophen (TYLENOL) 500 MG tablet Take 500 mg by mouth daily as needed for moderate pain or headache.     [provider]  bimatoprost (LUMIGAN) 0.03 % ophthalmic solution 1 drop at bedtime.    [provider]  dexamethasone 0.5 MG/5ML elixir Take by mouth. 12/23/19   [provider]  fluticasone (FLONASE) 50 MCG/ACT nasal spray Place 2 sprays into both nostrils daily  as needed for allergies. Patient taking differently: Place 2 sprays into both nostrils daily. 07/19/17   Tower, Wynelle Fanny, MD  lisinopril (ZESTRIL) 10 MG tablet Take 1 tablet (10 mg total) by mouth daily. 01/27/20   Naaman Plummer, MD  omeprazole (PRILOSEC) 40 MG capsule Take 1 capsule by mouth once daily 03/03/20   Tower, Wynelle Fanny, MD  pravastatin (PRAVACHOL) 40 MG tablet Take 1 tablet (40 mg total) by mouth daily. 01/27/20   Naaman Plummer, MD  sodium chloride (OCEAN) 0.65 % SOLN nasal spray Place 1 spray into both nostrils daily as needed for congestion.     [provider]  timolol (TIMOPTIC) 0.5 % ophthalmic solution 1 drop 2 (two) times daily. 11/29/19   [provider]    Allergies Adhesive [tape], Aspirin, Latex, Lipitor [atorvastatin], Tetanus toxoid, and Ultram [tramadol]    Social History Social History    Tobacco Use  . Smoking status: Former Smoker    Quit date: 02/29/1968    Years since quitting: 52.1  . Smokeless tobacco: Never Used  Vaping Use  . Vaping Use: Never used  Substance Use Topics  . Alcohol use: No    Alcohol/week: 0.0 standard drinks  . Drug use: No    Review of Systems Patient denies headaches, rhinorrhea, blurry vision, numbness, shortness of breath, chest pain, edema, cough, abdominal pain, nausea, vomiting, diarrhea, dysuria, fevers, rashes or hallucinations unless otherwise stated above in HPI. ____________________________________________   PHYSICAL EXAM:  VITAL SIGNS: Vitals:   03-31-20 1444 03-31-20 1510  BP: (!) 148/79 (!) 166/108  Pulse: 88 73  Resp: 17 (!) 34  Temp: 99.4 F (37.4 C)   SpO2: 97% 99%    Constitutional: GCS 6 Eyes: Conjunctivae are normal. Pupils mid point and reactive Head: bogginess to left occipital scalp without clear laceration. blood obscuring left TM Nose: No congestion/rhinnorhea. Mouth/Throat: Mucous membranes are moist.   Neck: No stridor. Painless ROM.  Cardiovascular: Normal rate, regular rhythm. Grossly normal heart sounds.  Good peripheral circulation. Respiratory: mild tachypnea.  No retractions. Lungs with coarse bibasilar bs Gastrointestinal: Soft and nontender. No distention. No abdominal bruits. No CVA tenderness. Genitourinary:  Musculoskeletal: No lower extremity tenderness nor edema.  No joint effusions. Neurologic:  GCS 6, not following commands,  No reaction to painful stimuli. Skin:  Skin is warm, dry Psychiatric: unable to asess  ____________________________________________   LABS (all labs ordered are listed, but only abnormal results are displayed)  Results for orders placed or performed during the hospital encounter of 2020-03-31 (from the past 24 hour(s))  Basic metabolic panel     Status: Abnormal   Collection Time: Mar 31, 2020  2:52 PM  Result Value Ref Range   Sodium 137 135 - 145 mmol/L    Potassium 4.5 3.5 - 5.1 mmol/L   Chloride 103 98 - 111 mmol/L   CO2 22 22 - 32 mmol/L   Glucose, Bld 157 (H) 70 - 99 mg/dL   BUN 15 8 - 23 mg/dL   Creatinine, Ser 0.60 0.44 - 1.00 mg/dL   Calcium 9.0 8.9 - 10.3 mg/dL   GFR, Estimated >60 >60 mL/min   Anion gap 12 5 - 15  CBC     Status: Abnormal   Collection Time: Mar 31, 2020  2:52 PM  Result Value Ref Range   WBC 18.9 (H) 4.0 - 10.5 K/uL   RBC 3.55 (L) 3.87 - 5.11 MIL/uL   Hemoglobin 10.3 (L) 12.0 - 15.0 g/dL   HCT 32.2 (L) 36.0 -  46.0 %   MCV 90.7 80.0 - 100.0 fL   MCH 29.0 26.0 - 34.0 pg   MCHC 32.0 30.0 - 36.0 g/dL   RDW 15.4 11.5 - 15.5 %   Platelets 247 150 - 400 K/uL   nRBC 0.0 0.0 - 0.2 %  Protime-INR     Status: None   Collection Time: 03/28/2020  2:54 PM  Result Value Ref Range   Prothrombin Time 14.7 11.4 - 15.2 seconds   INR 1.2 0.8 - 1.2   ____________________________________________  EKG My review and personal interpretation at Time: 14:28   Indication: ams  Rate: 90  Rhythm: sinus Axis: normal Other: normal intervals, no stemi ____________________________________________  RADIOLOGY  I personally reviewed all radiographic images ordered to evaluate for the above acute complaints and reviewed radiology reports and findings.  These findings were personally discussed with the patient.  Please see medical record for radiology report.  ____________________________________________   PROCEDURES  Procedure(s) performed:  Procedures    Critical Care performed: no ____________________________________________   INITIAL IMPRESSION / ASSESSMENT AND PLAN / ED COURSE  Pertinent labs & imaging results that were available during my care of the patient were reviewed by me and considered in my medical decision making (see chart for details).   DDX: sdh, iph, sah, fracture, contusion, seizure, electrolyte abn  Victoria Allison is a 80 y.o. who presents to the ED with presentation as described above.  Patient critically  ill-appearing arrives with evidence of closed head injury after mechanical fall.  Initial thought was to intubate but family arrived and states that patient is DNR/DNI would not want any surgical management or life-saving measures at this time but agreeable to CT imaging and diagnostic testing.  She is given Zofran.  She was taken to CT imaging which showed evidence of life-threatening close head injury with acute subdural, fracture and acute contusions.  Discussed findings with the patient's daughter at bedside who states that based on her mother's goals of care she would like to be made comfort care.  Does not want to be transferred for neurosurgical evaluation, DNR/DNI CODE STATUS updated in EPIC.  Family provided support.  No additional questions at this time.  Have ordered analgesics and symptomatic management.   We will observe her in the ER and manage her symptoms for a few hours.  If she does not die in the near term, will discuss with hospitalist for admission for comfort measures.  Clinical Course as of 03/29/2020 1530  Tue Mar 24, 2020  1514 MCHC: 32.0 [PR]    Clinical Course User Index [PR] Merlyn Lot, MD    The patient was evaluated in Emergency Department today for the symptoms described in the history of present illness. He/she was evaluated in the context of the global COVID-19 pandemic, which necessitated consideration that the patient might be at risk for infection with the SARS-CoV-2 virus that causes COVID-19. Institutional protocols and algorithms that pertain to the evaluation of patients at risk for COVID-19 are in a state of rapid change based on information released by regulatory bodies including the CDC and federal and state organizations. These policies and algorithms were followed during the patient's care in the ED.  As part of my medical decision making, I reviewed the following data within the [facility] notes reviewed and incorporated, Labs  reviewed, notes from prior ED visits and [place] Controlled Substance Database   ____________________________________________   FINAL CLINICAL IMPRESSION(S) / ED DIAGNOSES  Final diagnoses:  SDH (subdural hematoma) (HCC)  Closed head injury, initial encounter      NEW MEDICATIONS STARTED DURING THIS VISIT:  New Prescriptions   No medications on file     Note:  This document was prepared using Dragon voice recognition software and may include unintentional dictation errors.    Merlyn Lot, MD 03/25/2020 1530

## 2020-03-24 NOTE — ED Provider Notes (Signed)
-----------------------------------------   4:02 PM on 04-11-20 -----------------------------------------  Blood pressure (!) 166/108, pulse 73, temperature 99.4 F (37.4 C), resp. rate (!) 34, last menstrual period 02/29/1968, SpO2 99 %.  Assuming care from Dr. Quentin Cornwall.  In short, Victoria Allison is a 80 y.o. female with a chief complaint of Fall .  Refer to the original H&P for additional details.  The current plan of care is to observe patient and manage pain, patient currently receiving comfort care for devastating traumatic brain injury.  ----------------------------------------- 8:23 PM on April 11, 2020 -----------------------------------------  On reassessment, patient appears comfortable and surrounded by family.  Case discussed with hospitalist for admission for palliative care consultation and further comfort care measures.  Family is in agreement with plan.   Blake Divine, MD 2020-04-11 2023

## 2020-03-25 ENCOUNTER — Encounter: Payer: Self-pay | Admitting: Internal Medicine

## 2020-03-25 DIAGNOSIS — K920 Hematemesis: Secondary | ICD-10-CM | POA: Diagnosis present

## 2020-03-25 DIAGNOSIS — I129 Hypertensive chronic kidney disease with stage 1 through stage 4 chronic kidney disease, or unspecified chronic kidney disease: Secondary | ICD-10-CM | POA: Diagnosis present

## 2020-03-25 DIAGNOSIS — E785 Hyperlipidemia, unspecified: Secondary | ICD-10-CM | POA: Diagnosis present

## 2020-03-25 DIAGNOSIS — S0219XA Other fracture of base of skull, initial encounter for closed fracture: Secondary | ICD-10-CM | POA: Diagnosis present

## 2020-03-25 DIAGNOSIS — Z515 Encounter for palliative care: Secondary | ICD-10-CM | POA: Diagnosis not present

## 2020-03-25 DIAGNOSIS — K219 Gastro-esophageal reflux disease without esophagitis: Secondary | ICD-10-CM | POA: Diagnosis present

## 2020-03-25 DIAGNOSIS — Z9842 Cataract extraction status, left eye: Secondary | ICD-10-CM | POA: Diagnosis not present

## 2020-03-25 DIAGNOSIS — Z888 Allergy status to other drugs, medicaments and biological substances status: Secondary | ICD-10-CM | POA: Diagnosis not present

## 2020-03-25 DIAGNOSIS — W1830XA Fall on same level, unspecified, initial encounter: Secondary | ICD-10-CM | POA: Diagnosis present

## 2020-03-25 DIAGNOSIS — Z86010 Personal history of colonic polyps: Secondary | ICD-10-CM | POA: Diagnosis not present

## 2020-03-25 DIAGNOSIS — Z885 Allergy status to narcotic agent status: Secondary | ICD-10-CM | POA: Diagnosis not present

## 2020-03-25 DIAGNOSIS — I609 Nontraumatic subarachnoid hemorrhage, unspecified: Secondary | ICD-10-CM | POA: Diagnosis not present

## 2020-03-25 DIAGNOSIS — S065X9A Traumatic subdural hemorrhage with loss of consciousness of unspecified duration, initial encounter: Secondary | ICD-10-CM | POA: Diagnosis present

## 2020-03-25 DIAGNOSIS — Z66 Do not resuscitate: Secondary | ICD-10-CM | POA: Diagnosis present

## 2020-03-25 DIAGNOSIS — Z90710 Acquired absence of both cervix and uterus: Secondary | ICD-10-CM | POA: Diagnosis not present

## 2020-03-25 DIAGNOSIS — Z91040 Latex allergy status: Secondary | ICD-10-CM | POA: Diagnosis not present

## 2020-03-25 DIAGNOSIS — Z9841 Cataract extraction status, right eye: Secondary | ICD-10-CM | POA: Diagnosis not present

## 2020-03-25 DIAGNOSIS — S02119A Unspecified fracture of occiput, initial encounter for closed fracture: Secondary | ICD-10-CM | POA: Diagnosis present

## 2020-03-25 DIAGNOSIS — S066X9A Traumatic subarachnoid hemorrhage with loss of consciousness of unspecified duration, initial encounter: Secondary | ICD-10-CM | POA: Diagnosis present

## 2020-03-25 DIAGNOSIS — Z800 Family history of malignant neoplasm of digestive organs: Secondary | ICD-10-CM | POA: Diagnosis not present

## 2020-03-25 DIAGNOSIS — S0990XA Unspecified injury of head, initial encounter: Secondary | ICD-10-CM

## 2020-03-25 DIAGNOSIS — N189 Chronic kidney disease, unspecified: Secondary | ICD-10-CM | POA: Diagnosis present

## 2020-03-25 DIAGNOSIS — Z801 Family history of malignant neoplasm of trachea, bronchus and lung: Secondary | ICD-10-CM | POA: Diagnosis not present

## 2020-03-25 DIAGNOSIS — Z87891 Personal history of nicotine dependence: Secondary | ICD-10-CM | POA: Diagnosis not present

## 2020-03-25 DIAGNOSIS — Z20822 Contact with and (suspected) exposure to covid-19: Secondary | ICD-10-CM | POA: Diagnosis present

## 2020-03-25 DIAGNOSIS — R54 Age-related physical debility: Secondary | ICD-10-CM | POA: Diagnosis present

## 2020-03-25 DIAGNOSIS — I2510 Atherosclerotic heart disease of native coronary artery without angina pectoris: Secondary | ICD-10-CM | POA: Diagnosis present

## 2020-03-25 LAB — SARS CORONAVIRUS 2 (TAT 6-24 HRS): SARS Coronavirus 2: NEGATIVE

## 2020-03-25 MED ORDER — MORPHINE 100MG IN NS 100ML (1MG/ML) PREMIX INFUSION
2.0000 mg/h | INTRAVENOUS | Status: DC
  Administered 2020-03-25: 2 mg/h via INTRAVENOUS
  Administered 2020-03-26: 4 mg/h via INTRAVENOUS
  Filled 2020-03-25: qty 100

## 2020-03-25 NOTE — Progress Notes (Signed)
Victoria Allison  WUJ:[id number] DOB: 1940-04-28 DOA: March 29, 2020 PCP: Abner Greenspan, MD    Brief Narrative:  80 year old with a history of HTN, bladder cancer, glaucoma, CKD, diverticulosis, and CAD who presented to the ED via private vehicle after suffering multiple falls at home. The patient's daughter was called to the patient's home because the neighbor noticed the patient had left her car door open with bags of groceries in the car. The patient was found laying on her back on the floor with blood around her. She was not responsive. In the ED CT of the head noted severe closed head injury to include hemorrhagic contusions of both frontal lobes and the left temporal tip as well as intraparenchymal hematoma in the left parietal region. There was a traumatic subarachnoid hemorrhage as well as a subdural hematoma. There was evidence of mass-effect and left-to-right shift of 12 mm. There was additional longitudinal temporal bone fracture as well as a left occipital skull fracture. The ED team discussed the patient's grave condition with the patient's daughter and the decision was made to transition her to comfort focused care.  Significant Events:  1/25 admit via ED  Subjective: Resting comfortably at the time of my visit. Family at bedside and lovingly wish to focus on comfort.   Assessment & Plan:  Multiple traumatic closed head injuries  Comfort focused care most appropriate ultimate disposition would be placement within a residential hospice facility   Code Status: FULL CODE Family Communication: spoke w/ sister and grandson at bedside  Status is: Inpatient   The patient will require care spanning > 2 midnights and should be moved to inpatient because: Inpatient level of care appropriate due to severity of illness  Dispo: The patient is from: Home              Anticipated d/c is to: Hospice home              Anticipated d/c date is: 1 day              Patient currently is not  medically stable to d/c.   Difficult to place patient No   Consultants:  Palliative Care   Objective: Blood pressure 130/66, pulse 97, temperature 99.4 F (37.4 C), resp. rate (!) 40, last menstrual period 02/29/1968, SpO2 100 %. No intake or output data in the 24 hours ending 03/25/20 1037 There were no vitals filed for this visit.  Examination: General: No acute respiratory distress Lungs: Clear to auscultation bilaterally without wheezes or crackles Cardiovascular: Regular rate and rhythm without murmur gallop or rub normal S1 and S2 Abdomen: Nontender, nondistended, soft, bowel sounds positive, no rebound, no ascites, no appreciable mass Extremities: No significant cyanosis, clubbing, or edema bilateral lower extremities  CBC: Recent Labs  Lab 2020-03-29 1452  WBC 18.9*  HGB 10.3*  HCT 32.2*  MCV 90.7  PLT 956   Basic Metabolic Panel: Recent Labs  Lab March 29, 2020 1452  NA 137  K 4.5  CL 103  CO2 22  GLUCOSE 157*  BUN 15  CREATININE 0.60  CALCIUM 9.0   Coagulation Profile: Recent Labs  Lab 2020/03/29 1454  INR 1.2    HbA1C: Hgb A1c MFr Bld  Date/Time Value Ref Range Status  01/21/2020 08:59 AM 6.0 4.6 - 6.5 % Final    Comment:    Glycemic Control Guidelines for People with Diabetes:Non Diabetic:  <6%Goal of Therapy: <7%Additional Action Suggested:  >8%   08/28/2018 07:57 AM 6.0 4.6 - 6.5 %  Final    Comment:    Glycemic Control Guidelines for People with Diabetes:Non Diabetic:  <6%Goal of Therapy: <7%Additional Action Suggested:  >8%     Recent Results (from the past 240 hour(s))  SARS CORONAVIRUS 2 (TAT 6-24 HRS) Nasopharyngeal Nasopharyngeal Swab     Status: None   Collection Time: 03/11/2020  8:45 PM   Specimen: Nasopharyngeal Swab  Result Value Ref Range Status   SARS Coronavirus 2 NEGATIVE NEGATIVE Final    Comment: (NOTE) SARS-CoV-2 target nucleic acids are NOT DETECTED.  The SARS-CoV-2 RNA is generally detectable in upper and lower respiratory  specimens during the acute phase of infection. Negative results do not preclude SARS-CoV-2 infection, do not rule out co-infections with other pathogens, and should not be used as the sole basis for treatment or other patient management decisions. Negative results must be combined with clinical observations, patient history, and epidemiological information. The expected result is Negative.  Fact Sheet for Patients: SugarRoll.be  Fact Sheet for Healthcare Providers: https://www.woods-mathews.com/  This test is not yet approved or cleared by the [place] FDA and  has been authorized for detection and/or diagnosis of SARS-CoV-2 by FDA under an Emergency Use Authorization (EUA). This EUA will remain  in effect (meaning this test can be used) for the duration of the COVID-19 declaration under Se ction 564(b)(1) of the Act, 21 U.S.C. section 360bbb-3(b)(1), unless the authorization is terminated or revoked sooner.  Performed at [facility], [place] [address] [place], [place] [address]       LOS: 0 days   Cherene Altes, MD [facility] Office  [phone] Pager - Text Page per Amion  If 7PM-7AM, please contact night-coverage per Amion 03/25/2020, 10:37 AM

## 2020-03-25 NOTE — ED Notes (Signed)
Pt brief checked and pt dry at this time

## 2020-03-25 NOTE — ED Notes (Signed)
Almyra Free, RN (receiving nurse for 1C) confirms bed is ready and pt clear for transport from ED

## 2020-03-25 NOTE — Consult Note (Signed)
Consultation Note Date: 03/25/2020   Patient Name: Victoria Allison  DOB: 11/30/1940  MRN: [id number]  Age / Sex: 80 y.o., female  PCP: Tower, Wynelle Fanny, MD Referring Physician: Cherene Altes, MD  Reason for Consultation: Establishing goals of care and Psychosocial/spiritual support  HPI/Patient Profile: 80 y.o. female  with past medical history of hypertension, overactive bladder, history of bladder cancer currently follows with urology for surveillance, bilateral glaucoma, CKD, diverticulosis, CAD, admitted on 03/01/2020 with ground-level fall with multiple head injuries, end-of-life care.   Clinical Assessment and Goals of Care: Victoria Allison is lying quietly on stretcher in ED.  Her grandson, Victoria Allison, is at bedside.  We talked about her acute fall with head trauma.  Victoria Allison states that his mother, Victoria Allison, Victoria Allison's healthcare surrogate, but they are in agreement for comfort and dignity at end-of-life, let nature take its course.  We talked about symptom management, signs and symptoms of discomfort including, but not limited to increased respiratory rate, increased work of breathing.  At this point as needed morphine is effective for comfort.  Encourage nursing staff to give as needed, also encourage staff to give Ativan.  We talked about modern medicine prolonging life, but also prolonging the dying process.  At this point family is agreeable to reduce oxygen as Victoria Allison's breathing will allow.  Conference with attending, bedside nursing staff related to patient condition, needs, goals of care, symptom management. Prognosis: Hours to days, anticipate in hospital death.    HCPOA   NEXT OF KIN -daughter, Victoria Allison.  Victoria Allison son, Victoria Allison, is at bedside at this time.    SUMMARY OF RECOMMENDATIONS   Full comfort care, let nature take its course.   Code Status/Advance Care  Planning:  DNR  Symptom Management:   End-of-life comfort care orders in place  Palliative Prophylaxis:   Frequent Pain Assessment, Oral Care and Turn Reposition  Additional Recommendations (Limitations, Scope, Preferences):  Full Comfort Care  Psycho-social/Spiritual:   Desire for further Chaplaincy support:yes  Additional Recommendations: Caregiving  Support/Resources and Education on Hospice  Prognosis:   Hours - Days  Discharge Planning: Anticipated Hospital Death      Primary Diagnoses: Present on Admission: **None**   I have reviewed the medical record, interviewed the patient and family, and examined the patient. The following aspects are pertinent.  Past Medical History:  Diagnosis Date  . Allergic rhinitis   . CAD (coronary artery disease)   . Cancer ([place])    skin  . Chronic kidney disease    overactive bladder  . Colon polyp   . Diverticulosis    sigmoid and descending colon  . Elevated LFTs over one year ago  . Esophageal ring   . GERD (gastroesophageal reflux disease)   . History of hepatitis 1975   unsure of type  . Hyperlipemia   . Hypertension   . Internal hemorrhoids   . Lichen sclerosus 49/70   (DX BY GYN AFTER ABN PAP)  . Pemphigoid, cicatricial    D/O AFFECTING EYES, MOUTH,  THROAT NOW (DX [facility])  . Shingles 03/13/2013  . Urinary incontinence    OVERACTIVE BLADDER   Social History   Socioeconomic History  . Marital status: Widowed    Spouse name: Not on file  . Number of children: 2  . Years of education: Not on file  . Highest education level: Not on file  Occupational History  . Occupation: Retired    Fish farm manager: RETIRED  Tobacco Use  . Smoking status: Former Smoker    Quit date: 02/29/1968    Years since quitting: 52.1  . Smokeless tobacco: Never Used  Vaping Use  . Vaping Use: Never used  Substance and Sexual Activity  . Alcohol use: No    Alcohol/week: 0.0 standard drinks  . Drug use: No  . Sexual activity: Not  Currently    Birth control/protection: Post-menopausal  Other Topics Concern  . Not on file  Social History Narrative   Husband is fighting cancer. Exercises on stationary bike/walks   Daily caffeine    Social Determinants of Health   Financial Resource Strain: Not on file  Food Insecurity: Not on file  Transportation Needs: Not on file  Physical Activity: Not on file  Stress: Not on file  Social Connections: Not on file   Family History  Problem Relation Age of Onset  . Lung cancer Father   . Esophageal cancer Son 89  . Stomach cancer Son 84  . Stroke Mother        CVA  . Alzheimer's disease Mother   . Hypertension Brother   . Breast cancer Maternal Aunt   . Colon cancer Neg Hx   . Rectal cancer Neg Hx   . Colon polyps Neg Hx    Scheduled Meds: Continuous Infusions: PRN Meds:.acetaminophen **OR** acetaminophen, antiseptic oral rinse, diphenhydrAMINE, glycopyrrolate **OR** glycopyrrolate **OR** glycopyrrolate, haloperidol **OR** haloperidol **OR** haloperidol lactate, HYDROmorphone (DILAUDID) injection, loperamide, LORazepam **OR** LORazepam **OR** LORazepam, LORazepam, morphine CONCENTRATE **OR** morphine CONCENTRATE, polyvinyl alcohol, prochlorperazine **OR** prochlorperazine **OR** prochlorperazine, senna Medications Prior to Admission:  Prior to Admission medications   Medication Sig Start Date End Date Taking? Authorizing Provider  acetaminophen (TYLENOL) 500 MG tablet Take 500 mg by mouth daily as needed for moderate pain or headache.     [provider]  bimatoprost (LUMIGAN) 0.03 % ophthalmic solution 1 drop at bedtime.    [provider]  dexamethasone 0.5 MG/5ML elixir Take by mouth. 12/23/19   [provider]  fluticasone (FLONASE) 50 MCG/ACT nasal spray Place 2 sprays into both nostrils daily as needed for allergies. Patient taking differently: Place 2 sprays into both nostrils daily. 07/19/17   Tower, Wynelle Fanny, MD  lisinopril (ZESTRIL)  10 MG tablet Take 1 tablet (10 mg total) by mouth daily. 01/27/20   Naaman Plummer, MD  omeprazole (PRILOSEC) 40 MG capsule Take 1 capsule by mouth once daily 03/03/20   Tower, Wynelle Fanny, MD  pravastatin (PRAVACHOL) 40 MG tablet Take 1 tablet (40 mg total) by mouth daily. 01/27/20   Naaman Plummer, MD  sodium chloride (OCEAN) 0.65 % SOLN nasal spray Place 1 spray into both nostrils daily as needed for congestion.     [provider]  timolol (TIMOPTIC) 0.5 % ophthalmic solution 1 drop 2 (two) times daily. 11/29/19   [provider]   Allergies  Allergen Reactions  . Adhesive [Tape] Rash    Paper tape and tegaderm OK  . Aspirin Other (See Comments)    Bleeds too easily  . Latex Itching  and Rash  . Lipitor [Atorvastatin] Other (See Comments)    Elevated LFT's  . Tetanus Toxoid Rash  . Ultram [Tramadol] Other (See Comments)    Dizziness   Review of Systems  Unable to perform ROS: Acuity of condition  Musculoskeletal: Positive for arthralgias.    Physical Exam Vitals and nursing note reviewed.     Vital Signs: BP 130/66   Pulse (!) 128   Temp 99.4 F (37.4 C)   Resp (!) 31   LMP 02/29/1968   SpO2 95%  Pain Scale: 0-10   Pain Score: 5    SpO2: SpO2: 95 % O2 Device:SpO2: 95 % O2 Flow Rate: .   IO: Intake/output summary: No intake or output data in the 24 hours ending 03/25/20 1341  LBM:   Baseline Weight:   Most recent weight:       Palliative Assessment/Data:   Flowsheet Rows   Flowsheet Row Most Recent Value  Intake Tab   Referral Department Hospitalist  Unit at Time of Referral ER  Palliative Care Primary Diagnosis Trauma  Date Notified 03/14/2020  Palliative Care Type New Palliative care  Reason for referral Clarify Goals of Care, End of Life Care Assistance  Date first seen by Palliative Care 03/25/20  # of days Palliative referral response time 1 Day(s)  Clinical Assessment   Palliative Performance Scale Score 10%  Pain Max last 24 hours  Not able to report  Pain Min Last 24 hours Not able to report  Dyspnea Max Last 24 Hours Not able to report  Dyspnea Min Last 24 hours Not able to report  Psychosocial & Spiritual Assessment   Palliative Care Outcomes       Time In: 1105 Time Out: 1155 Time Total: 50 minutes  Greater than 50%  of this time was spent counseling and coordinating care related to the above assessment and plan.  Signed by: Drue Novel, NP   Please contact Palliative Medicine Team phone at [phone] for questions and concerns.  For individual provider: See Shea Evans

## 2020-03-25 NOTE — ED Notes (Signed)
Palliative care at bedside.

## 2020-03-25 NOTE — ED Notes (Signed)
Asked family if they would like any more medication given- family denies at this time

## 2020-03-25 NOTE — ED Notes (Signed)
Pt resting in bed at this time with daughter at bedside.  Pt appears comfortable at this time with no moaning or grunting noted.  No needs identified at this time.  Will continue to monitor.

## 2020-03-26 DIAGNOSIS — I609 Nontraumatic subarachnoid hemorrhage, unspecified: Secondary | ICD-10-CM | POA: Diagnosis not present

## 2020-03-26 DIAGNOSIS — S065X9A Traumatic subdural hemorrhage with loss of consciousness of unspecified duration, initial encounter: Secondary | ICD-10-CM | POA: Diagnosis not present

## 2020-03-26 DIAGNOSIS — L8990 Pressure ulcer of unspecified site, unspecified stage: Secondary | ICD-10-CM | POA: Insufficient documentation

## 2020-03-26 DIAGNOSIS — S0990XA Unspecified injury of head, initial encounter: Secondary | ICD-10-CM | POA: Diagnosis not present

## 2020-03-26 DIAGNOSIS — Z515 Encounter for palliative care: Secondary | ICD-10-CM | POA: Diagnosis not present

## 2020-03-31 ENCOUNTER — Ambulatory Visit: Payer: Self-pay

## 2020-03-31 NOTE — Progress Notes (Signed)
Palliative: [name] is lying quietly in bed, surrounded by her family.  She is full comfort care.  She has a morphine infusion and appears comfortable.  She does not respond to voice or touch.  Daughter, Victoria Allison, shares that their goal is for [name] to return to her own home with hospice care. She would 24/7 have at home care in addition to hospice.  Provider choice offered.  Family request [facility] hospice.  We talked about transfer to residential hospice, but family's concern is visitation.  They share that [name] has an extended family, and they want all to be present if possible.  This would mean disposition home.  Equipment needs discussed.  [name] family states they would would accept a hospital bed, but do not want to delay discharge waiting for hospital bed delivery.  She would benefit from oxygen.   They are requesting EMS transport home.  Transport to Victoria Allison's home.  We talked about signs and symptoms of discomfort.  I share the concern that [name] may become too unstable for transport.  We talked about the use of morphine for symptom management.  [name] is currently receiving 4 mg of morphine IV per hour.  She would likely need 5 to 10 mg of morphine by mouth/sublingual every 2 hours.  Family states understanding and willing to provide this care.  Prognosis discussed.  Days would be expected, although [name] has had nothing to eat or drink in several days, and she may pass away within 24 hours.  We talked about the possibility of increased respiratory rate that is not manageable with morphine due to traumatic brain injury.  Conference with attending, bedside nursing staff, transition of care team, local hospice liaison related to patient condition, needs, goals of care.  Plan: Full comfort care.  Requesting comfort and dignity at end-of-life, at home hospice with [facility].  Prognosis: Days anticipated   35 minutes Quinn Axe,  NP Palliative medicine team Team phone [phone] Greater than 50% of this time was spent counseling and coordinating care related to the above assessment and plan.

## 2020-03-31 NOTE — Progress Notes (Signed)
   April 24, 2020 1755  Attending [place]  Attending Physician Notified Y  Attending Physician (First and Last Name) Joette Catching  Will the above attending physician sign death certificate? Yes  Post Mortem Checklist  Date of Death 04-24-20  Time of Death 06-29-53  Pronounced By Hillis Range, RN, Stacie Glaze, RN  Next of kin notified Yes (family at bedside)  Name of next of kin notified of death Darlin Priestly  Contact Person's Relationship to Patient Daughter  Contact Person's Phone Number [phone]  Contact Person's address  (daughter at bedside)  Family Communication Notes daughter present at bedside  Was the patient a No Code Blue or a Limited Code Blue? No  Did the patient die unattended? No  Patient restrained? Not applicable  Height 5' (3.785 m)  Weight 44 kg  Body preparation complete Y  [place] [facility]  Notification Date 2020/04/24  Notification Time [place] [facility] Number [phone]  Is patient a potential donor? N  Autopsy  Autopsy requested by N/A  Patient and Hospital Property Returned  Patient belongings from bedside/safe/pharmacy returned  None  Dermatherapy linen/gowns NOT sent with patient or transporter Not applicable  Notifications  Patient Placement notified that Post Mortem checklist is complete Yes  Patient Placement notified body transferred Transported to [place]  [place] Notified N  Medical Examiner  Is this a medical examiner's case? [place] home name/address/phone # [place]  Planned location of pickup [place]

## 2020-03-31 NOTE — Progress Notes (Signed)
Victoria Allison  DPO:[id number] DOB: Dec 17, 1940 DOA: 03/11/2020 PCP: Abner Greenspan, MD    Brief Narrative:  [phone] with a history of HTN, bladder cancer, glaucoma, CKD, diverticulosis, and CAD who presented to the ED via private vehicle after suffering multiple falls at home. The patient's daughter was called to the patient's home because the neighbor noticed the patient had left her car door open with bags of groceries in the car. The patient was found laying on her back with blood around her. She was not responsive. In the ED CT of the head noted severe closed head injury to include hemorrhagic contusions of both frontal lobes and the left temporal tip as well as intraparenchymal hematoma in the left parietal region. There was a traumatic subarachnoid hemorrhage as well as a subdural hematoma. There was evidence of mass-effect and left-to-right shift of 12 mm. There was additional longitudinal temporal bone fracture as well as a left occipital skull fracture. The ED team discussed the patient's grave condition with the patient's daughter and the decision was made to transition her to comfort focused care.  Significant Events:  1/25 admit via ED  Subjective: Comfort focused care continues.  Assessment & Plan:  Multiple traumatic closed head injuries  Comfort focused care most appropriate ultimate disposition would be placement within a residential hospice facility   Code Status: FULL CODE Family Communication: spoke w/ sister and grandson at bedside  Status is: Inpatient   The patient will require care spanning > 2 midnights and should be moved to inpatient because: Inpatient level of care appropriate due to severity of illness  Dispo: The patient is from: Home              Anticipated d/c is to: Hospice home              Anticipated d/c date is: 1 day              Patient currently is not medically stable to d/c.   Difficult to place patient No   Consultants:  Palliative Care    Objective: Blood pressure (!) 86/53, pulse (!) 131, temperature (!) 101.5 F (38.6 C), resp. rate 12, height 5' (1.524 m), weight 44 kg, last menstrual period 02/29/1968, SpO2 (!) 79 %.  Intake/Output Summary (Last 24 hours) at 04-10-2020 0946 Last data filed at 04/10/2020 0640 Gross per 24 hour  Intake 28.01 ml  Output -  Net 28.01 ml   Filed Weights   03/25/20 2221  Weight: 44 kg    Examination: No evidence of acute distress - appears to be resting comfortably - spoke w/ patient's family who are with her at the bedside  CBC: Recent Labs  Lab 03/20/2020 1452  WBC 18.9*  HGB 10.3*  HCT 32.2*  MCV 90.7  PLT 400   Basic Metabolic Panel: Recent Labs  Lab 03/21/2020 1452  NA 137  K 4.5  CL 103  CO2 22  GLUCOSE 157*  BUN 15  CREATININE 0.60  CALCIUM 9.0   Coagulation Profile: Recent Labs  Lab 03/04/2020 1454  INR 1.2    HbA1C: Hgb A1c MFr Bld  Date/Time Value Ref Range Status  01/21/2020 08:59 AM 6.0 4.6 - 6.5 % Final    Comment:    Glycemic Control Guidelines for People with Diabetes:Non Diabetic:  <6%Goal of Therapy: <7%Additional Action Suggested:  >8%   08/28/2018 07:57 AM 6.0 4.6 - 6.5 % Final    Comment:    Glycemic Control Guidelines for  People with Diabetes:Non Diabetic:  <6%Goal of Therapy: <7%Additional Action Suggested:  >8%     Recent Results (from the past 240 hour(s))  SARS CORONAVIRUS 2 (TAT 6-24 HRS) Nasopharyngeal Nasopharyngeal Swab     Status: None   Collection Time: 03/18/2020  8:45 PM   Specimen: Nasopharyngeal Swab  Result Value Ref Range Status   SARS Coronavirus 2 NEGATIVE NEGATIVE Final    Comment: (NOTE) SARS-CoV-2 target nucleic acids are NOT DETECTED.  The SARS-CoV-2 RNA is generally detectable in upper and lower respiratory specimens during the acute phase of infection. Negative results do not preclude SARS-CoV-2 infection, do not rule out co-infections with other pathogens, and should not be used as the sole basis for  treatment or other patient management decisions. Negative results must be combined with clinical observations, patient history, and epidemiological information. The expected result is Negative.  Fact Sheet for Patients: SugarRoll.be  Fact Sheet for Healthcare Providers: https://www.woods-mathews.com/  This test is not yet approved or cleared by the [place] FDA and  has been authorized for detection and/or diagnosis of SARS-CoV-2 by FDA under an Emergency Use Authorization (EUA). This EUA will remain  in effect (meaning this test can be used) for the duration of the COVID-19 declaration under Se ction 564(b)(1) of the Act, 21 U.S.C. section 360bbb-3(b)(1), unless the authorization is terminated or revoked sooner.  Performed at [facility], [place] [address] [place], [place] [address]       LOS: 1 day   Cherene Altes, MD [facility] Office  [phone] Pager - Text Page per Amion  If 7PM-7AM, please contact night-coverage per Amion 04/25/2020, 9:46 AM

## 2020-03-31 NOTE — Plan of Care (Signed)

## 2020-03-31 DEATH — deceased

## 2020-04-06 ENCOUNTER — Ambulatory Visit: Payer: Self-pay

## 2020-04-28 NOTE — Discharge Summary (Addendum)
   Death Summary   Victoria Allison NMM:[id number] DOB: 1940/11/11 DOA: 2020-03-29  PCP: Abner Greenspan, MD  Admit date: 29-Mar-2020 Date of Death: March 31, 2020  Final Diagnoses:  Admission for end of life care SAH (subarachnoid hemorrhage)  Hemorrhagic contusions bilateral frontal lobes and left temporal tip Intraparenchymal hematoma left parietal lobe Longitudinal temporal bone fracture Left occipital skull fracture Multiple traumatic closed head injuries Pressure injury of sacrum stage 1, POA   History of present illness:  79yo with a history of HTN, bladder cancer, glaucoma, CKD, diverticulosis, and CAD who presented to the ED via private vehicle after suffering multiple falls at home. The patient's daughter was called to the patient's home because the neighbor noticed the patient had left her car door open with bags of groceries in the car. The patient was found laying on her back with blood around her. She was not responsive. In the ED CT of the head noted severe closed head injury to include hemorrhagic contusions of both frontal lobes and the left temporal tip as well as intraparenchymal hematoma in the left parietal region. There was a traumatic subarachnoid hemorrhage as well as a subdural hematoma. There was evidence of mass-effect and left-to-right shift of 12 mm. There was additional longitudinal temporal bone fracture as well as a left occipital skull fracture. The ED team discussed the patient's grave condition with the patient's daughter and the decision was made to transition her to comfort focused care.  Hospital Course:  As noted above, given the extent of the patient's injuries, the decision was made to transition to comfort focused care while the patient was still in the ED awaiting admission.  Palliative care was consulted and assisted with ongoing care of the patient.  She was able to be kept comfortable with no signs of severe distress during her hospital stay.  The patient  died 03/31/2020 at 16: 55 with family at bedside.   Signed:  Cherene Altes  [facility] 04/22/2020, 6:17 PM

## 2021-01-20 ENCOUNTER — Other Ambulatory Visit: Payer: Self-pay | Admitting: Urology

## 2021-02-15 ENCOUNTER — Ambulatory Visit: Payer: Self-pay | Admitting: Urology
